# Patient Record
Sex: Male | Born: 1955
Health system: Southern US, Community
[De-identification: ages and names within clinical notes are randomized; demographics above are authoritative.]

## PROBLEM LIST (undated history)

## (undated) DIAGNOSIS — F419 Anxiety disorder, unspecified: Secondary | ICD-10-CM

## (undated) DIAGNOSIS — B1920 Unspecified viral hepatitis C without hepatic coma: Secondary | ICD-10-CM

## (undated) DIAGNOSIS — I639 Cerebral infarction, unspecified: Secondary | ICD-10-CM

## (undated) DIAGNOSIS — G8929 Other chronic pain: Secondary | ICD-10-CM

## (undated) DIAGNOSIS — M549 Dorsalgia, unspecified: Secondary | ICD-10-CM

## (undated) DIAGNOSIS — I10 Essential (primary) hypertension: Secondary | ICD-10-CM

## (undated) HISTORY — DX: Unspecified viral hepatitis C without hepatic coma: B19.20

---

## 1999-08-09 HISTORY — PX: CERVICAL DISC SURGERY: SHX588

## 1999-10-05 ENCOUNTER — Encounter: Payer: Self-pay | Admitting: Neurosurgery

## 1999-10-05 ENCOUNTER — Ambulatory Visit (HOSPITAL_COMMUNITY): Admission: RE | Admit: 1999-10-05 | Discharge: 1999-10-05 | Payer: Self-pay | Admitting: Neurosurgery

## 1999-12-14 ENCOUNTER — Ambulatory Visit (HOSPITAL_COMMUNITY): Admission: RE | Admit: 1999-12-14 | Discharge: 1999-12-14 | Payer: Self-pay | Admitting: Neurosurgery

## 1999-12-14 ENCOUNTER — Encounter: Payer: Self-pay | Admitting: Neurosurgery

## 2000-01-07 ENCOUNTER — Encounter: Payer: Self-pay | Admitting: Neurosurgery

## 2000-01-07 ENCOUNTER — Ambulatory Visit (HOSPITAL_COMMUNITY): Admission: RE | Admit: 2000-01-07 | Discharge: 2000-01-07 | Payer: Self-pay | Admitting: Neurosurgery

## 2004-06-11 ENCOUNTER — Ambulatory Visit (HOSPITAL_COMMUNITY): Admission: RE | Admit: 2004-06-11 | Discharge: 2004-06-11 | Payer: Self-pay | Admitting: Family Medicine

## 2005-08-16 ENCOUNTER — Ambulatory Visit (HOSPITAL_COMMUNITY): Admission: RE | Admit: 2005-08-16 | Discharge: 2005-08-16 | Payer: Self-pay | Admitting: Family Medicine

## 2005-08-30 ENCOUNTER — Ambulatory Visit: Payer: Self-pay | Admitting: Gastroenterology

## 2005-09-30 ENCOUNTER — Ambulatory Visit: Payer: Self-pay | Admitting: Gastroenterology

## 2005-10-31 ENCOUNTER — Encounter (INDEPENDENT_AMBULATORY_CARE_PROVIDER_SITE_OTHER): Payer: Self-pay | Admitting: *Deleted

## 2005-10-31 ENCOUNTER — Other Ambulatory Visit: Admission: RE | Admit: 2005-10-31 | Discharge: 2005-10-31 | Payer: Self-pay | Admitting: Urology

## 2005-11-08 ENCOUNTER — Ambulatory Visit (HOSPITAL_COMMUNITY): Admission: RE | Admit: 2005-11-08 | Discharge: 2005-11-08 | Payer: Self-pay | Admitting: General Surgery

## 2006-01-13 ENCOUNTER — Encounter (INDEPENDENT_AMBULATORY_CARE_PROVIDER_SITE_OTHER): Payer: Self-pay | Admitting: Specialist

## 2006-01-13 ENCOUNTER — Ambulatory Visit (HOSPITAL_COMMUNITY): Admission: RE | Admit: 2006-01-13 | Discharge: 2006-01-13 | Payer: Self-pay | Admitting: Gastroenterology

## 2006-01-26 ENCOUNTER — Ambulatory Visit: Payer: Self-pay | Admitting: Gastroenterology

## 2006-07-21 ENCOUNTER — Ambulatory Visit: Payer: Self-pay | Admitting: Gastroenterology

## 2006-10-17 ENCOUNTER — Encounter: Admission: RE | Admit: 2006-10-17 | Discharge: 2006-10-17 | Payer: Self-pay | Admitting: Neurosurgery

## 2010-12-24 NOTE — Op Note (Signed)
[place]. [facility]  Patient:    Brendan Charles, Brendan Charles                     MRN: [id number] Proc. Date: 12/14/99 Adm. Date:  [id number] Disc. Date: [id number] Attending:  Gerald Dexter                           Operative Report  PREOPERATIVE DIAGNOSIS:  Spondylosis C5-6, C6-7 right.  POSTOPERATIVE DIAGNOSIS:  Spondylosis C5-6, C6-7 right.  PROCEDURE:  C5-6 and C6-7 anterior cervical diskectomy with fibular bone bank fusion followed by Atlantis anterior cervical plating with the operating microscope.  SURGEON:  Reinaldo Meeker, M.D.  ASSISTANT:  Julio Sicks, M.D.  DESCRIPTION OF PROCEDURE:  After being placed in the supine position in 5 pounds of Holter traction, the patients neck was prepped and draped in the usual sterile fashion.  A localizing x-ray was taken prior to incision to identify the appropriate level.  A transverse incision was then made in the right anterior neck starting at the midline heading toward the medial aspect of the sternocleidomastoid muscle.  The platysma muscle was then incised transversely.  The natural fascial plane between the strap muscles medially and the sternocleidomastoid laterally as identified and followed down to the anterior aspect of the cervical spine.  The  longus coli muscles were identified and split in the midline, stripped away bilaterally with the Kitner dissecting elevator.  A second x-ray was taken to confirm approach at the appropriate levels and this was correct.  Using the 15 blade, the disk at C5-6 and C6-7 was incised.  Using pituitary rongeurs and curets, approximately 90% of the disk material at both levels was removed.  Highspeed drill was used to widen the disk spaces.  The microscope was draped, brought into the  field, and used for the remainder of the case.  Starting at C5-6, the remainder of the disk material down to the posterior longitudinal ligament was removed.  The  posterior  longitudinal ligament was then incised transversely and the cut edges  removed with the small Kerrison punch.  Aggressive right C5-6 foraminal decompression was then carried out due to the marked spondylosis noted upon the  nerve root.  Very thorough decompression was carried out.  At this point, further decompression was carried out on the left, asymptomatic side, but not as aggressively as on the right.  Inspection at this time was carried out for any signs of residual compression and none was identified.  Attention was then turned to C6-7 where a very similar finding was carried out.  Once again, the posterior longitudinal ligament was then incised transversely and the cut edges removed with the Kerrison punch.  Once again, marked spondylosis was noted in the C6-7 foramen on the right and very thorough C7 nerve root decompression was then carried out  using small Kerrison punch.  At this point, inspection was carried out on both levels for any evidence of residual compression and none could be identified. Large amounts of irrigation were carried out.  A 6 and 7 mm bone bank plugs were reconstituted.  After irrigating once more to confirm the hemostasis, the 6 mm lug was impacted at C5-6 and the 7 mm plug impacted at C6-7.  Fluoroscopy at this time showed the plugs to be in excellent position.  Large amounts of irrigation were  carried out once more.  An appropriate Atlantis anterior cervical plate  was then chosen.  Under fluoroscopic guidance, pilot holes were drilled, tapped, and then screws placed into C5, C6, and C7 vertebral bodies.  Locking screws were then tightened.  Final fluoroscopy in lateral plane showed excellent placement of the plate and screws.  Large amounts of irrigation were carried out at this time and any bleeding controlled with bipolar coagulation.  The wound was then closed using interrupted Vicryl on the platysma muscle, inverted 5-0 PDS in the  subcuticular  layer, staples on the skin.  A sterile dressing and Aspen collar were applied. he patient was extubated and taken to the recovery room in stable condition. DD:  12/14/99 TD:  12/15/99 Job: [id number]

## 2010-12-24 NOTE — H&P (Signed)
NAME:  Brendan Charles, Brendan Charles              ACCOUNT NO.:  [id number]   MEDICAL RECORD NO.:  [id number]          PATIENT TYPE:  AMB   LOCATION:  DAY                           FACILITY:  [facility]   PHYSICIAN:  Dalia Heading, M.D.  DATE OF BIRTH:  March 28, 1956   DATE OF ADMISSION:  DATE OF DISCHARGE:  LH                                HISTORY & PHYSICAL   CHIEF COMPLAINT:  Need for screening colonoscopy.   HISTORY OF PRESENT ILLNESS:  The patient is a 55 year old white male who is  referred for endoscopic evaluation.  Needs colonoscopy for screening  purposes.  No abdominal pain, weight loss, nausea, vomiting, diarrhea,  constipation, melena or hematochezia been noted.  He has never had a  colonoscopy.  There is no family history of colon carcinoma.   PAST MEDICAL HISTORY:  Unremarkable.   PAST SURGICAL HISTORY:  Neck surgery.   CURRENT MEDICATIONS:   CURRENT MEDICATIONS:  Voltaren, Lorcet and Flexeril.   ALLERGIES:  No known drug allergies.   REVIEW OF SYSTEMS:  The patient smokes a half pack cigarettes a day.  Denies  any significant alcohol use.   PHYSICAL EXAMINATION:  GENERAL:  On physical examination, the patient is a  well-developed, well-nourished white male in no acute distress.  LUNGS:  Clear to auscultation with equal breath sounds bilaterally.  HEART:  Heart examination reveals regular rate and rhythm without history,  S4 or murmurs.  ABDOMEN:  The abdomen is soft, nontender, nondistended.  No  hepatosplenomegaly or masses are noted.  RECTAL:  Rectal examination was deferred to the procedure.   IMPRESSION:  Need for screening colonoscopy.   PLAN:  The patient is scheduled for colonoscopy on November 08, 2005.  Risks and  benefits of procedure including bleeding and perforation were fully  explained to the patient, who gave informed consent.      Dalia Heading, M.D.  Electronically Signed     MAJ/MEDQ  D:  11/02/2005  T:  11/02/2005  Job:  [id number]   cc:   Kirk Ruths, M.D.  Fax: [phone]

## 2013-08-08 HISTORY — PX: LUMBAR SPINE SURGERY: SHX701

## 2014-02-21 ENCOUNTER — Other Ambulatory Visit (HOSPITAL_COMMUNITY): Payer: Self-pay | Admitting: *Deleted

## 2014-02-21 ENCOUNTER — Ambulatory Visit (HOSPITAL_COMMUNITY)
Admission: RE | Admit: 2014-02-21 | Discharge: 2014-02-21 | Disposition: A | Discharge status: Home / Self Care | Payer: Disability Insurance | Source: Ambulatory Visit | Attending: Family Medicine | Admitting: Family Medicine

## 2014-02-21 DIAGNOSIS — M549 Dorsalgia, unspecified: Secondary | ICD-10-CM | POA: Diagnosis not present

## 2014-02-21 DIAGNOSIS — M4120 Other idiopathic scoliosis, site unspecified: Secondary | ICD-10-CM | POA: Diagnosis not present

## 2014-02-21 DIAGNOSIS — M5030 Other cervical disc degeneration, unspecified cervical region: Secondary | ICD-10-CM | POA: Diagnosis not present

## 2014-02-21 DIAGNOSIS — M419 Scoliosis, unspecified: Secondary | ICD-10-CM

## 2014-02-21 DIAGNOSIS — S3992XA Unspecified injury of lower back, initial encounter: Secondary | ICD-10-CM

## 2014-02-21 DIAGNOSIS — M542 Cervicalgia: Secondary | ICD-10-CM | POA: Insufficient documentation

## 2014-04-16 ENCOUNTER — Other Ambulatory Visit (HOSPITAL_COMMUNITY): Payer: Self-pay | Admitting: Family Medicine

## 2014-04-16 DIAGNOSIS — M545 Low back pain: Secondary | ICD-10-CM

## 2014-04-18 ENCOUNTER — Ambulatory Visit (HOSPITAL_COMMUNITY)
Admission: RE | Admit: 2014-04-18 | Discharge: 2014-04-18 | Disposition: A | Discharge status: Home / Self Care | Payer: Medicaid Other | Source: Ambulatory Visit | Attending: Family Medicine | Admitting: Family Medicine

## 2014-04-18 DIAGNOSIS — M51379 Other intervertebral disc degeneration, lumbosacral region without mention of lumbar back pain or lower extremity pain: Secondary | ICD-10-CM | POA: Insufficient documentation

## 2014-04-18 DIAGNOSIS — M48061 Spinal stenosis, lumbar region without neurogenic claudication: Secondary | ICD-10-CM | POA: Diagnosis not present

## 2014-04-18 DIAGNOSIS — M5137 Other intervertebral disc degeneration, lumbosacral region: Secondary | ICD-10-CM | POA: Insufficient documentation

## 2014-04-18 DIAGNOSIS — M545 Low back pain, unspecified: Secondary | ICD-10-CM | POA: Diagnosis present

## 2014-04-18 DIAGNOSIS — M79609 Pain in unspecified limb: Secondary | ICD-10-CM | POA: Diagnosis not present

## 2014-04-18 DIAGNOSIS — M5126 Other intervertebral disc displacement, lumbar region: Secondary | ICD-10-CM | POA: Diagnosis not present

## 2014-05-07 ENCOUNTER — Other Ambulatory Visit: Payer: Self-pay | Admitting: Neurosurgery

## 2014-05-07 DIAGNOSIS — M4316 Spondylolisthesis, lumbar region: Secondary | ICD-10-CM

## 2014-05-12 ENCOUNTER — Ambulatory Visit
Admission: RE | Admit: 2014-05-12 | Discharge: 2014-05-12 | Disposition: A | Discharge status: Home / Self Care | Payer: Medicaid Other | Source: Ambulatory Visit | Attending: Neurosurgery | Admitting: Neurosurgery

## 2014-05-12 VITALS — BP 148/87 | HR 80

## 2014-05-12 DIAGNOSIS — M4316 Spondylolisthesis, lumbar region: Secondary | ICD-10-CM

## 2014-05-12 MED ORDER — IOHEXOL 180 MG/ML  SOLN
1.0000 mL | Freq: Once | INTRAMUSCULAR | Status: AC | PRN
  Administered 2014-05-12: 1 mL via EPIDURAL

## 2014-05-12 MED ORDER — METHYLPREDNISOLONE ACETATE 40 MG/ML INJ SUSP (RADIOLOG
120.0000 mg | Freq: Once | INTRAMUSCULAR | Status: AC
  Administered 2014-05-12: 120 mg via EPIDURAL

## 2014-05-12 NOTE — Discharge Instructions (Signed)

## 2014-05-19 ENCOUNTER — Other Ambulatory Visit: Payer: Self-pay | Admitting: Neurosurgery

## 2014-05-20 ENCOUNTER — Encounter (HOSPITAL_COMMUNITY): Payer: Self-pay | Admitting: Pharmacy Technician

## 2014-05-26 ENCOUNTER — Other Ambulatory Visit (HOSPITAL_COMMUNITY): Payer: Self-pay | Admitting: *Deleted

## 2014-05-26 NOTE — Pre-Procedure Instructions (Signed)
Susanne GreenhouseDouglas B Sammons  05/26/2014   Your procedure is scheduled on:  Tuesday, June 03, 2014 at 11:30 AM.   [facility] at 9:30 AM.   Call this number if you have problems the morning of surgery: [phone]   Remember:   Do not eat food or drink liquids after midnight Monday, 06/02/14.   Take these medicines the morning of surgery with A SIP OF WATER: ALPRAZolam Prudy Feeler(XANAX) - if needed, HYDROcodone-acetaminophen (NORCO) - if needed    Do not wear jewelry.  Do not wear lotions, powders, or cologne. You may wear deodorant.  Men may shave face and neck.  Do not bring valuables to the hospital.  [facility] not responsible                  for any belongings or valuables.               Contacts, dentures or bridgework may not be worn into surgery.  Leave suitcase in the car. After surgery it may be brought to your room.  For patients admitted to the hospital, discharge time is determined by your                treatment team.              Please read over the following fact sheets that you were given: Pain Booklet, Coughing and Deep Breathing, Blood Transfusion Information, MRSA Information and Surgical Site Infection Prevention

## 2014-05-27 ENCOUNTER — Encounter (HOSPITAL_COMMUNITY)
Admission: RE | Admit: 2014-05-27 | Discharge: 2014-05-27 | Disposition: A | Discharge status: Home / Self Care | Payer: Medicaid Other | Source: Ambulatory Visit | Attending: Neurosurgery | Admitting: Neurosurgery

## 2014-05-27 ENCOUNTER — Encounter (HOSPITAL_COMMUNITY): Payer: Self-pay

## 2014-05-27 DIAGNOSIS — Z01812 Encounter for preprocedural laboratory examination: Secondary | ICD-10-CM | POA: Diagnosis not present

## 2014-05-27 HISTORY — DX: Anxiety disorder, unspecified: F41.9

## 2014-05-27 LAB — CBC
HCT: 48.1 % (ref 39.0–52.0)
Hemoglobin: 16.5 g/dL (ref 13.0–17.0)
MCH: 31.3 pg (ref 26.0–34.0)
MCHC: 34.3 g/dL (ref 30.0–36.0)
MCV: 91.1 fL (ref 78.0–100.0)
Platelets: 294 10*3/uL (ref 150–400)
RBC: 5.28 MIL/uL (ref 4.22–5.81)
RDW: 13.7 % (ref 11.5–15.5)
WBC: 13.3 10*3/uL — AB (ref 4.0–10.5)

## 2014-05-27 LAB — COMPREHENSIVE METABOLIC PANEL
ALBUMIN: 4 g/dL (ref 3.5–5.2)
ALK PHOS: 109 U/L (ref 39–117)
ALT: 62 U/L — ABNORMAL HIGH (ref 0–53)
ANION GAP: 13 (ref 5–15)
AST: 26 U/L (ref 0–37)
BUN: 16 mg/dL (ref 6–23)
CO2: 25 mEq/L (ref 19–32)
Calcium: 9.7 mg/dL (ref 8.4–10.5)
Chloride: 102 mEq/L (ref 96–112)
Creatinine, Ser: 0.89 mg/dL (ref 0.50–1.35)
GFR calc Af Amer: >90 mL/min (ref >90)
GFR calc non Af Amer: >90 mL/min (ref >90)
Glucose, Bld: 97 mg/dL (ref 70–99)
POTASSIUM: 4.3 meq/L (ref 3.7–5.3)
SODIUM: 140 meq/L (ref 137–147)
TOTAL PROTEIN: 7.6 g/dL (ref 6.0–8.3)
Total Bilirubin: 0.4 mg/dL (ref 0.3–1.2)

## 2014-05-27 LAB — TYPE AND SCREEN
ABO/RH(D): B POS
Antibody Screen: NEGATIVE

## 2014-05-27 LAB — SURGICAL PCR SCREEN
MRSA, PCR: NEGATIVE
Staphylococcus aureus: NEGATIVE

## 2014-05-27 LAB — ABO/RH: ABO/RH(D): B POS

## 2014-05-27 NOTE — Progress Notes (Signed)
Primary - [phone] does not remember name  No cardiologist No prior cardiac testing

## 2014-06-02 MED ORDER — CEFAZOLIN SODIUM-DEXTROSE 2-3 GM-% IV SOLR
2.0000 g | INTRAVENOUS | Status: AC
  Administered 2014-06-03: 2 g via INTRAVENOUS
  Filled 2014-06-02: qty 50

## 2014-06-02 MED ORDER — DEXAMETHASONE SODIUM PHOSPHATE 10 MG/ML IJ SOLN
10.0000 mg | INTRAMUSCULAR | Status: DC
  Filled 2014-06-02: qty 1

## 2014-06-02 NOTE — Anesthesia Preprocedure Evaluation (Addendum)
Anesthesia Evaluation  Patient identified by MRN, date of birth, ID band Patient awake    Reviewed: Allergy & Precautions, H&P , NPO status , Patient's Chart, lab work & pertinent test results  Airway Mallampati: II   Neck ROM: Full    Dental  (+) Missing, Dental Advisory Given   Pulmonary Current Smoker,  Current smoker, 25 pack year breath sounds clear to auscultation        Cardiovascular Rhythm:Regular     Neuro/Psych Anxiety Xanax   GI/Hepatic (+) Hepatitis -  Endo/Other    Renal/GU      Musculoskeletal   Abdominal (+)  Abdomen: soft.    Peds  Hematology   Anesthesia Other Findings   Reproductive/Obstetrics                            Anesthesia Physical Anesthesia Plan  ASA: III  Anesthesia Plan: General   Post-op Pain Management:    Induction: Intravenous  Airway Management Planned: Oral ETT  Additional Equipment:   Intra-op Plan:   Post-operative Plan:   Informed Consent: I have reviewed the patients History and Physical, chart, labs and discussed the procedure including the risks, benefits and alternatives for the proposed anesthesia with the patient or authorized representative who has indicated his/her understanding and acceptance.     Plan Discussed with:   Anesthesia Plan Comments: (Multinodal pain RX, (Tylenol, Precedex, Decadron, Toradal if OK by surgeon))        Anesthesia Quick Evaluation

## 2014-06-02 NOTE — Progress Notes (Signed)
Patient instructed to arrive at 530 am on 06/03/14.

## 2014-06-03 ENCOUNTER — Inpatient Hospital Stay (HOSPITAL_COMMUNITY): Payer: Medicaid Other

## 2014-06-03 ENCOUNTER — Encounter (HOSPITAL_COMMUNITY)
Admission: RE | Disposition: A | Discharge status: Home / Self Care | Payer: Self-pay | Source: Ambulatory Visit | Attending: Neurosurgery

## 2014-06-03 ENCOUNTER — Encounter (HOSPITAL_COMMUNITY): Payer: Self-pay | Admitting: *Deleted

## 2014-06-03 ENCOUNTER — Inpatient Hospital Stay (HOSPITAL_COMMUNITY): Payer: Medicaid Other | Admitting: Anesthesiology

## 2014-06-03 ENCOUNTER — Encounter (HOSPITAL_COMMUNITY): Payer: Medicaid Other | Admitting: Anesthesiology

## 2014-06-03 ENCOUNTER — Inpatient Hospital Stay (HOSPITAL_COMMUNITY)
Admission: RE | Admit: 2014-06-03 | Discharge: 2014-06-04 | DRG: 460 | Disposition: A | Discharge status: Home / Self Care | Payer: Medicaid Other | Source: Ambulatory Visit | Attending: Neurosurgery | Admitting: Neurosurgery

## 2014-06-03 DIAGNOSIS — F419 Anxiety disorder, unspecified: Secondary | ICD-10-CM | POA: Diagnosis present

## 2014-06-03 DIAGNOSIS — Z7982 Long term (current) use of aspirin: Secondary | ICD-10-CM | POA: Diagnosis not present

## 2014-06-03 DIAGNOSIS — M4316 Spondylolisthesis, lumbar region: Principal | ICD-10-CM | POA: Diagnosis present

## 2014-06-03 DIAGNOSIS — M5126 Other intervertebral disc displacement, lumbar region: Secondary | ICD-10-CM | POA: Diagnosis present

## 2014-06-03 DIAGNOSIS — M549 Dorsalgia, unspecified: Secondary | ICD-10-CM | POA: Diagnosis present

## 2014-06-03 DIAGNOSIS — F17210 Nicotine dependence, cigarettes, uncomplicated: Secondary | ICD-10-CM | POA: Diagnosis present

## 2014-06-03 DIAGNOSIS — M4326 Fusion of spine, lumbar region: Secondary | ICD-10-CM

## 2014-06-03 SURGERY — POSTERIOR LUMBAR FUSION 1 LEVEL
Anesthesia: General | Site: Back | Laterality: Bilateral

## 2014-06-03 MED ORDER — MIDAZOLAM HCL 5 MG/5ML IJ SOLN
INTRAMUSCULAR | Status: DC | PRN
  Administered 2014-06-03: 2 mg via INTRAVENOUS

## 2014-06-03 MED ORDER — PANTOPRAZOLE SODIUM 40 MG IV SOLR
40.0000 mg | Freq: Every day | INTRAVENOUS | Status: DC
  Administered 2014-06-03: 40 mg via INTRAVENOUS
  Filled 2014-06-03 (×2): qty 40

## 2014-06-03 MED ORDER — ACETAMINOPHEN 650 MG RE SUPP: 650.0000 mg | RECTAL | Status: DC | PRN

## 2014-06-03 MED ORDER — HYDROCODONE-ACETAMINOPHEN 5-325 MG PO TABS
1.0000 | ORAL_TABLET | ORAL | Status: DC | PRN
  Administered 2014-06-03 – 2014-06-04 (×4): 2 via ORAL
  Filled 2014-06-03 (×3): qty 2

## 2014-06-03 MED ORDER — FENTANYL CITRATE 0.05 MG/ML IJ SOLN
INTRAMUSCULAR | Status: AC
  Filled 2014-06-03: qty 5

## 2014-06-03 MED ORDER — ACETAMINOPHEN 10 MG/ML IV SOLN
INTRAVENOUS | Status: AC
  Administered 2014-06-03: 1000 mg via INTRAVENOUS
  Filled 2014-06-03: qty 100

## 2014-06-03 MED ORDER — FENTANYL CITRATE 0.05 MG/ML IJ SOLN
25.0000 ug | INTRAMUSCULAR | Status: DC | PRN
  Administered 2014-06-03: 25 ug via INTRAVENOUS
  Administered 2014-06-03: 50 ug via INTRAVENOUS
  Administered 2014-06-03: 25 ug via INTRAVENOUS
  Administered 2014-06-03: 50 ug via INTRAVENOUS

## 2014-06-03 MED ORDER — ONDANSETRON HCL 4 MG/2ML IJ SOLN
INTRAMUSCULAR | Status: AC
  Filled 2014-06-03: qty 2

## 2014-06-03 MED ORDER — SODIUM CHLORIDE 0.9 % IJ SOLN: 3.0000 mL | INTRAMUSCULAR | Status: DC | PRN

## 2014-06-03 MED ORDER — DEXMEDETOMIDINE HCL IN NACL 200 MCG/50ML IV SOLN
INTRAVENOUS | Status: AC
  Filled 2014-06-03: qty 50

## 2014-06-03 MED ORDER — PROPOFOL 10 MG/ML IV BOLUS
INTRAVENOUS | Status: DC | PRN
Start: 2014-06-03 — End: 2014-06-03
  Administered 2014-06-03: 50 mg via INTRAVENOUS
  Administered 2014-06-03: 150 mg via INTRAVENOUS

## 2014-06-03 MED ORDER — SODIUM CHLORIDE 0.9 % IV SOLN
10.0000 mg | INTRAVENOUS | Status: DC | PRN
  Administered 2014-06-03: 10 ug/min via INTRAVENOUS

## 2014-06-03 MED ORDER — 0.9 % SODIUM CHLORIDE (POUR BTL) OPTIME
TOPICAL | Status: DC | PRN
  Administered 2014-06-03: 1000 mL

## 2014-06-03 MED ORDER — FENTANYL CITRATE 0.05 MG/ML IJ SOLN
INTRAMUSCULAR | Status: AC
  Administered 2014-06-03: 25 ug via INTRAVENOUS
  Filled 2014-06-03: qty 2

## 2014-06-03 MED ORDER — HYDROMORPHONE HCL 1 MG/ML IJ SOLN
1.0000 mg | INTRAMUSCULAR | Status: DC | PRN
  Administered 2014-06-03: 1.5 mg via INTRAMUSCULAR
  Administered 2014-06-03: 1 mg via INTRAMUSCULAR
  Administered 2014-06-04: 1.5 mg via INTRAMUSCULAR
  Filled 2014-06-03: qty 2
  Filled 2014-06-03: qty 1
  Filled 2014-06-03: qty 2

## 2014-06-03 MED ORDER — DEXAMETHASONE SODIUM PHOSPHATE 4 MG/ML IJ SOLN: 4.0000 mg | Freq: Four times a day (QID) | INTRAMUSCULAR | Status: AC

## 2014-06-03 MED ORDER — EPHEDRINE SULFATE 50 MG/ML IJ SOLN
INTRAMUSCULAR | Status: DC | PRN
  Administered 2014-06-03: 5 mg via INTRAVENOUS
  Administered 2014-06-03: 10 mg via INTRAVENOUS

## 2014-06-03 MED ORDER — ARTIFICIAL TEARS OP OINT
TOPICAL_OINTMENT | OPHTHALMIC | Status: AC
  Filled 2014-06-03: qty 3.5

## 2014-06-03 MED ORDER — NEOSTIGMINE METHYLSULFATE 10 MG/10ML IV SOLN
INTRAVENOUS | Status: DC | PRN
  Administered 2014-06-03: 5 mg via INTRAVENOUS

## 2014-06-03 MED ORDER — STERILE WATER FOR INJECTION IJ SOLN
INTRAMUSCULAR | Status: AC
  Filled 2014-06-03: qty 10

## 2014-06-03 MED ORDER — CYCLOBENZAPRINE HCL 10 MG PO TABS
ORAL_TABLET | ORAL | Status: AC
  Filled 2014-06-03: qty 1

## 2014-06-03 MED ORDER — PHENOL 1.4 % MT LIQD: 1.0000 | OROMUCOSAL | Status: DC | PRN

## 2014-06-03 MED ORDER — MENTHOL 3 MG MT LOZG: 1.0000 | LOZENGE | OROMUCOSAL | Status: DC | PRN

## 2014-06-03 MED ORDER — THROMBIN 20000 UNITS EX SOLR
CUTANEOUS | Status: DC | PRN
  Administered 2014-06-03: 08:00:00 via TOPICAL

## 2014-06-03 MED ORDER — ALUM & MAG HYDROXIDE-SIMETH 200-200-20 MG/5ML PO SUSP
30.0000 mL | Freq: Four times a day (QID) | ORAL | Status: DC | PRN
Start: 2014-06-03 — End: 2014-06-04

## 2014-06-03 MED ORDER — SODIUM CHLORIDE 0.9 % IJ SOLN
INTRAMUSCULAR | Status: AC
  Filled 2014-06-03: qty 10

## 2014-06-03 MED ORDER — GLYCOPYRROLATE 0.2 MG/ML IJ SOLN
INTRAMUSCULAR | Status: AC
  Filled 2014-06-03: qty 4

## 2014-06-03 MED ORDER — EPHEDRINE SULFATE 50 MG/ML IJ SOLN
INTRAMUSCULAR | Status: AC
  Filled 2014-06-03: qty 1

## 2014-06-03 MED ORDER — ALPRAZOLAM 0.5 MG PO TABS
0.5000 mg | ORAL_TABLET | Freq: Three times a day (TID) | ORAL | Status: DC | PRN
  Administered 2014-06-03 (×2): 0.5 mg via ORAL
  Filled 2014-06-03 (×2): qty 1

## 2014-06-03 MED ORDER — VECURONIUM BROMIDE 10 MG IV SOLR
INTRAVENOUS | Status: AC
  Filled 2014-06-03: qty 10

## 2014-06-03 MED ORDER — BUPIVACAINE LIPOSOME 1.3 % IJ SUSP
20.0000 mL | INTRAMUSCULAR | Status: AC
  Administered 2014-06-03: 20 mL
  Filled 2014-06-03: qty 20

## 2014-06-03 MED ORDER — ONDANSETRON HCL 4 MG/2ML IJ SOLN: 4.0000 mg | INTRAMUSCULAR | Status: DC | PRN

## 2014-06-03 MED ORDER — ARTIFICIAL TEARS OP OINT
TOPICAL_OINTMENT | OPHTHALMIC | Status: DC | PRN
  Administered 2014-06-03: 1 via OPHTHALMIC

## 2014-06-03 MED ORDER — SODIUM CHLORIDE 0.9 % IV SOLN
INTRAVENOUS | Status: DC | PRN
  Administered 2014-06-03: 07:00:00 via INTRAVENOUS

## 2014-06-03 MED ORDER — ROCURONIUM BROMIDE 100 MG/10ML IV SOLN
INTRAVENOUS | Status: DC | PRN
Start: 2014-06-03 — End: 2014-06-03
  Administered 2014-06-03: 50 mg via INTRAVENOUS

## 2014-06-03 MED ORDER — LACTATED RINGERS IV SOLN
INTRAVENOUS | Status: DC | PRN
Start: 2014-06-03 — End: 2014-06-03
  Administered 2014-06-03: 07:00:00 via INTRAVENOUS

## 2014-06-03 MED ORDER — DEXAMETHASONE SODIUM PHOSPHATE 10 MG/ML IJ SOLN
INTRAMUSCULAR | Status: DC | PRN
  Administered 2014-06-03: 10 mg via INTRAVENOUS

## 2014-06-03 MED ORDER — DEXAMETHASONE 4 MG PO TABS
4.0000 mg | ORAL_TABLET | Freq: Once | ORAL | Status: AC
  Administered 2014-06-03: 4 mg via ORAL
  Filled 2014-06-03: qty 1

## 2014-06-03 MED ORDER — LIDOCAINE HCL (CARDIAC) 20 MG/ML IV SOLN
INTRAVENOUS | Status: AC
  Filled 2014-06-03: qty 10

## 2014-06-03 MED ORDER — CYCLOBENZAPRINE HCL 10 MG PO TABS
10.0000 mg | ORAL_TABLET | Freq: Three times a day (TID) | ORAL | Status: DC | PRN
  Administered 2014-06-03 – 2014-06-04 (×3): 10 mg via ORAL
  Filled 2014-06-03 (×2): qty 1

## 2014-06-03 MED ORDER — GLYCOPYRROLATE 0.2 MG/ML IJ SOLN
INTRAMUSCULAR | Status: DC | PRN
  Administered 2014-06-03: 0.6 mg via INTRAVENOUS

## 2014-06-03 MED ORDER — ACETAMINOPHEN 325 MG PO TABS: 650.0000 mg | ORAL_TABLET | ORAL | Status: DC | PRN

## 2014-06-03 MED ORDER — FENTANYL CITRATE 0.05 MG/ML IJ SOLN
INTRAMUSCULAR | Status: AC
  Administered 2014-06-03: 50 ug via INTRAVENOUS
  Filled 2014-06-03: qty 2

## 2014-06-03 MED ORDER — SODIUM CHLORIDE 0.9 % IJ SOLN
3.0000 mL | Freq: Two times a day (BID) | INTRAMUSCULAR | Status: DC
  Administered 2014-06-03 (×2): 3 mL via INTRAVENOUS

## 2014-06-03 MED ORDER — SODIUM CHLORIDE 0.9 % IR SOLN
Status: DC | PRN
  Administered 2014-06-03: 08:00:00

## 2014-06-03 MED ORDER — FENTANYL CITRATE 0.05 MG/ML IJ SOLN
INTRAMUSCULAR | Status: DC | PRN
  Administered 2014-06-03: 50 ug via INTRAVENOUS
  Administered 2014-06-03 (×3): 100 ug via INTRAVENOUS
  Administered 2014-06-03: 50 ug via INTRAVENOUS
  Administered 2014-06-03: 100 ug via INTRAVENOUS

## 2014-06-03 MED ORDER — MIDAZOLAM HCL 2 MG/2ML IJ SOLN
INTRAMUSCULAR | Status: AC
  Filled 2014-06-03: qty 2

## 2014-06-03 MED ORDER — HYDROCODONE-ACETAMINOPHEN 5-325 MG PO TABS
ORAL_TABLET | ORAL | Status: AC
  Filled 2014-06-03: qty 2

## 2014-06-03 MED ORDER — NEOSTIGMINE METHYLSULFATE 10 MG/10ML IV SOLN
INTRAVENOUS | Status: AC
  Filled 2014-06-03: qty 1

## 2014-06-03 MED ORDER — DEXMEDETOMIDINE HCL 200 MCG/2ML IV SOLN: INTRAVENOUS | Status: DC | PRN

## 2014-06-03 MED ORDER — PROMETHAZINE HCL 25 MG/ML IJ SOLN: 6.2500 mg | INTRAMUSCULAR | Status: DC | PRN

## 2014-06-03 MED ORDER — KCL IN DEXTROSE-NACL 20-5-0.45 MEQ/L-%-% IV SOLN
80.0000 mL/h | INTRAVENOUS | Status: DC
  Filled 2014-06-03 (×3): qty 1000

## 2014-06-03 MED ORDER — DEXAMETHASONE 4 MG PO TABS
4.0000 mg | ORAL_TABLET | Freq: Four times a day (QID) | ORAL | Status: AC
  Administered 2014-06-03: 4 mg via ORAL
  Filled 2014-06-03: qty 1

## 2014-06-03 MED ORDER — VECURONIUM BROMIDE 10 MG IV SOLR
INTRAVENOUS | Status: DC | PRN
  Administered 2014-06-03: 2 mg via INTRAVENOUS
  Administered 2014-06-03: 4 mg via INTRAVENOUS
  Administered 2014-06-03: 2 mg via INTRAVENOUS

## 2014-06-03 MED ORDER — DEXMEDETOMIDINE HCL 200 MCG/2ML IV SOLN
INTRAVENOUS | Status: DC | PRN
  Administered 2014-06-03 (×4): 10 ug via INTRAVENOUS

## 2014-06-03 MED ORDER — ONDANSETRON HCL 4 MG/2ML IJ SOLN
INTRAMUSCULAR | Status: DC | PRN
  Administered 2014-06-03: 4 mg via INTRAVENOUS

## 2014-06-03 MED ORDER — MEPERIDINE HCL 25 MG/ML IJ SOLN: 6.2500 mg | INTRAMUSCULAR | Status: DC | PRN

## 2014-06-03 MED ORDER — LIDOCAINE HCL (CARDIAC) 20 MG/ML IV SOLN
INTRAVENOUS | Status: DC | PRN
  Administered 2014-06-03: 50 mg via INTRAVENOUS
  Administered 2014-06-03: 100 mg via INTRAVENOUS

## 2014-06-03 MED ORDER — SODIUM CHLORIDE 0.9 % IV SOLN
INTRAVENOUS | Status: DC | PRN
  Administered 2014-06-03: 08:00:00 via INTRAVENOUS

## 2014-06-03 MED ORDER — CEFAZOLIN SODIUM-DEXTROSE 2-3 GM-% IV SOLR
2.0000 g | Freq: Three times a day (TID) | INTRAVENOUS | Status: AC
  Administered 2014-06-03 (×2): 2 g via INTRAVENOUS
  Filled 2014-06-03 (×2): qty 50

## 2014-06-03 SURGICAL SUPPLY — 74 items
BAG DECANTER FOR FLEXI CONT (MISCELLANEOUS) ×3 IMPLANT
BENZOIN TINCTURE PRP APPL 2/3 (GAUZE/BANDAGES/DRESSINGS) ×6 IMPLANT
BLADE CLIPPER SURG (BLADE) IMPLANT
BONE EQUIVA 5CC (Bone Implant) ×3 IMPLANT
BRUSH SCRUB EZ PLAIN DRY (MISCELLANEOUS) ×3 IMPLANT
BUR CUTTER 7.0 ROUND (BURR) ×3 IMPLANT
BUR MATCHSTICK NEURO 3.0 LAGG (BURR) ×3 IMPLANT
CAGE PEEK OPTIMA ARDIS 11X9X26 (Cage) ×6 IMPLANT
CANISTER SUCT 3000ML (MISCELLANEOUS) ×3 IMPLANT
CLOSURE WOUND 1/2 X4 (GAUZE/BANDAGES/DRESSINGS) ×2
CONT SPEC 4OZ CLIKSEAL STRL BL (MISCELLANEOUS) ×6 IMPLANT
COVER BACK TABLE 60X90IN (DRAPES) ×3 IMPLANT
DERMABOND ADVANCED (GAUZE/BANDAGES/DRESSINGS)
DERMABOND ADVANCED .7 DNX12 (GAUZE/BANDAGES/DRESSINGS) IMPLANT
DRAPE C-ARM 42X72 X-RAY (DRAPES) ×3 IMPLANT
DRAPE LAPAROTOMY 100X72X124 (DRAPES) ×3 IMPLANT
DRAPE SURG 17X23 STRL (DRAPES) ×6 IMPLANT
DRSG AQUACEL AG ADV 3.5X 6 (GAUZE/BANDAGES/DRESSINGS) ×6 IMPLANT
DRSG OPSITE POSTOP 4X6 (GAUZE/BANDAGES/DRESSINGS) ×3 IMPLANT
DRSG TELFA 3X8 NADH (GAUZE/BANDAGES/DRESSINGS) ×3 IMPLANT
DURAPREP 26ML APPLICATOR (WOUND CARE) ×3 IMPLANT
ELECT REM PT RETURN 9FT ADLT (ELECTROSURGICAL) ×3
ELECTRODE REM PT RTRN 9FT ADLT (ELECTROSURGICAL) ×1 IMPLANT
EVACUATOR 1/8 PVC DRAIN (DRAIN) ×3 IMPLANT
GAUZE SPONGE 4X4 12PLY STRL (GAUZE/BANDAGES/DRESSINGS) ×3 IMPLANT
GAUZE SPONGE 4X4 16PLY XRAY LF (GAUZE/BANDAGES/DRESSINGS) ×3 IMPLANT
GLOVE BIOGEL PI IND STRL 7.0 (GLOVE) ×1 IMPLANT
GLOVE BIOGEL PI IND STRL 7.5 (GLOVE) ×3 IMPLANT
GLOVE BIOGEL PI INDICATOR 7.0 (GLOVE) ×2
GLOVE BIOGEL PI INDICATOR 7.5 (GLOVE) ×6
GLOVE ECLIPSE 7.0 STRL STRAW (GLOVE) ×3 IMPLANT
GLOVE ECLIPSE 8.0 STRL XLNG CF (GLOVE) ×6 IMPLANT
GLOVE EXAM NITRILE LRG STRL (GLOVE) IMPLANT
GLOVE EXAM NITRILE MD LF STRL (GLOVE) IMPLANT
GLOVE EXAM NITRILE XS STR PU (GLOVE) IMPLANT
GLOVE SS N UNI LF 7.0 STRL (GLOVE) ×12 IMPLANT
GOWN STRL REUS W/ TWL LRG LVL3 (GOWN DISPOSABLE) ×2 IMPLANT
GOWN STRL REUS W/ TWL XL LVL3 (GOWN DISPOSABLE) ×2 IMPLANT
GOWN STRL REUS W/TWL 2XL LVL3 (GOWN DISPOSABLE) IMPLANT
GOWN STRL REUS W/TWL LRG LVL3 (GOWN DISPOSABLE) ×4
GOWN STRL REUS W/TWL XL LVL3 (GOWN DISPOSABLE) ×4
HANDLE PEDIGUARD CANNULATED (INSTRUMENTS) ×3 IMPLANT
K-WIRE NITHNOL TROCAR TIP (WIRE) ×12 IMPLANT
KIT BASIN OR (CUSTOM PROCEDURE TRAY) ×3 IMPLANT
KIT ROOM TURNOVER OR (KITS) ×3 IMPLANT
NEEDLE 1 PEDIGUARD CANNULATED (NEEDLE) ×6 IMPLANT
NEEDLE HYPO 21X1.5 SAFETY (NEEDLE) ×3 IMPLANT
NEEDLE HYPO 22GX1.5 SAFETY (NEEDLE) ×3 IMPLANT
NS IRRIG 1000ML POUR BTL (IV SOLUTION) ×3 IMPLANT
PACK LAMINECTOMY NEURO (CUSTOM PROCEDURE TRAY) ×3 IMPLANT
PAD ARMBOARD 7.5X6 YLW CONV (MISCELLANEOUS) ×9 IMPLANT
PATTIES SURGICAL .75X.75 (GAUZE/BANDAGES/DRESSINGS) IMPLANT
PEDICLE ACCESS TOOL SHEATH ×3 IMPLANT
ROD PATHFINDER 40MM (Rod) ×3 IMPLANT
ROD PATHFINDER PERC .45MM (Rod) ×3 IMPLANT
SCREW MIN INVASIVE 6.5X45 (Screw) ×6 IMPLANT
SCREW POLYAXIA MIS 6.5X40MM (Screw) ×6 IMPLANT
SHEATH PAT (SHEATH) ×3 IMPLANT
SPONGE LAP 4X18 X RAY DECT (DISPOSABLE) IMPLANT
SPONGE SURGIFOAM ABS GEL 100 (HEMOSTASIS) ×3 IMPLANT
STRIP CLOSURE SKIN 1/2X4 (GAUZE/BANDAGES/DRESSINGS) ×4 IMPLANT
SUT PROLENE 0 CT 1 30 (SUTURE) IMPLANT
SUT VIC AB 0 CT1 18XCR BRD8 (SUTURE) ×2 IMPLANT
SUT VIC AB 0 CT1 8-18 (SUTURE) ×4
SUT VIC AB 2-0 OS6 18 (SUTURE) ×9 IMPLANT
SUT VIC AB 3-0 CP2 18 (SUTURE) ×3 IMPLANT
SYR 20CC LL (SYRINGE) ×3 IMPLANT
SYR 20ML ECCENTRIC (SYRINGE) ×3 IMPLANT
TOP CLSR SEQUOIA (Orthopedic Implant) ×12 IMPLANT
TOWEL OR 17X24 6PK STRL BLUE (TOWEL DISPOSABLE) ×3 IMPLANT
TOWEL OR 17X26 10 PK STRL BLUE (TOWEL DISPOSABLE) ×3 IMPLANT
TRAP SPECIMEN MUCOUS 40CC (MISCELLANEOUS) ×3 IMPLANT
TRAY FOLEY CATH 14FRSI W/METER (CATHETERS) ×3 IMPLANT
WATER STERILE IRR 1000ML POUR (IV SOLUTION) ×3 IMPLANT

## 2014-06-03 NOTE — Anesthesia Procedure Notes (Signed)
Procedure Name: Intubation Date/Time: 06/03/2014 7:42 AM Performed by: Wray KearnsFOLEY, Carinna Newhart A Pre-anesthesia Checklist: Patient identified, Timeout performed, Emergency Drugs available, Suction available and Patient being monitored Patient Re-evaluated:Patient Re-evaluated prior to inductionOxygen Delivery Method: Circle system utilized Preoxygenation: Pre-oxygenation with 100% oxygen Intubation Type: IV induction and Cricoid Pressure applied Ventilation: Mask ventilation without difficulty Laryngoscope Size: Mac and 4 Grade View: Grade I Tube type: Oral Tube size: 8.0 mm Number of attempts: 1 Airway Equipment and Method: Stylet Placement Confirmation: ETT inserted through vocal cords under direct vision,  breath sounds checked- equal and bilateral and positive ETCO2 Secured at: 23 cm Tube secured with: Tape Dental Injury: Teeth and Oropharynx as per pre-operative assessment

## 2014-06-03 NOTE — Transfer of Care (Signed)
Immediate Anesthesia Transfer of Care Note  Patient: Brendan Charles  Procedure(s) Performed: Procedure(s) with comments: POSTERIOR LUMBAR FUSION 1 LEVEL (Bilateral) - POSTERIOR LUMBAR FUSION 1 LEVEL LUMBAR 3-4  Patient Location: PACU  Anesthesia Type:General  Level of Consciousness: awake, oriented, sedated, patient cooperative and responds to stimulation  Airway & Oxygen Therapy: Patient Spontanous Breathing and Patient connected to nasal cannula oxygen  Post-op Assessment: Report given to PACU RN, Post -op Vital signs reviewed and stable, Patient moving all extremities and Patient moving all extremities X 4  Post vital signs: Reviewed and stable  Complications: No apparent anesthesia complications

## 2014-06-03 NOTE — H&P (Signed)
  Brendan GreenhouseDouglas B Bobb is an 58 y.o. male.   Chief Complaint: Back and left leg pain HPI: The patient is a 58 year old gentleman who is evaluated in the office for left lower back pain which goes into his left buttock and into the anterior thigh with tingling. It started back in March of this year when he hurt himself shoveling snow. He saw his medical doctors tried some prednisone without relief. An MRI scan was done he was seen in the office in September. When seen in the office his right side was basically asymptomatic. His MRI scan was reviewed. It showed retrolisthesis at L3-4 with a large disc herniation with an inferior fragment. He was tried an additional conservative therapy with epidural shots and this gave him no relief. He therefore requested surgery now comes for decompression with discectomy interbody fusion and pedicle screw fixation. I have had a long discussion with him regarding the risks and benefits of surgical intervention. The risks discussed include but are not limited to bleeding infection weakness numbness paralysis spinal fluid leak trouble with instrumentation nonunion coma and death. We have discussed alternative methods of therapy on the risks and benefits of nonintervention. He's had the opportunity to ask questions and appears to understand. With this information hand he has requested we proceed with surgery.  Past Medical History  Diagnosis Date  . Anxiety   . Hepatitis     c    Past Surgical History  Procedure Laterality Date  . Cervical disc surgery  2001    anterior    History reviewed. No pertinent family history. Social History:  reports that he has been smoking Cigarettes.  He has a 25 pack-year smoking history. He does not have any smokeless tobacco history on file. He reports that he does not drink alcohol or use illicit drugs.  Allergies:  Allergies  Allergen Reactions  . Codeine Itching and Nausea Only    Medications Prior to Admission  Medication Sig  Dispense Refill  . ALPRAZolam (XANAX) 0.5 MG tablet Take 0.5 mg by mouth 3 (three) times daily as needed for anxiety.      Marland Kitchen. aspirin 81 MG tablet Take 81 mg by mouth daily.      Marland Kitchen. HYDROcodone-acetaminophen (NORCO) 10-325 MG per tablet Take 1 tablet by mouth every 6 (six) hours as needed for moderate pain or severe pain.        No results found for this or any previous visit (from the past 48 hour(s)). No results found.  A comprehensive review of systems was negative.  Blood pressure 147/93, pulse 87, temperature 98 F (36.7 C), temperature source Oral, resp. rate 20, weight 71.668 kg (158 lb), SpO2 99.00%.  The patient is awake alert and oriented. He has no facial asymmetry. He has decreased ankle jerk reflexes bilaterally. His strength however is 5 over 5. Assessment/Plan Impression is that of listhesis and stenosis and a herniated disc at L3-4. The plan is for L3-4 decompression with discectomy interbody fusion and pedicle screw fixation.  Reinaldo MeekerKRITZER,Sokha Craker O, MD 06/03/2014, 7:31 AM

## 2014-06-03 NOTE — Anesthesia Postprocedure Evaluation (Signed)
  Anesthesia Post-op Note  Patient: Brendan Charles  Procedure(s) Performed: Procedure(s) with comments: POSTERIOR LUMBAR FUSION 1 LEVEL (Bilateral) - POSTERIOR LUMBAR FUSION 1 LEVEL LUMBAR 3-4  Patient Location: PACU  Anesthesia Type:General  Level of Consciousness: awake, alert  and oriented  Airway and Oxygen Therapy: Patient Spontanous Breathing and Patient connected to face mask oxygen  Post-op Pain: mild  Post-op Assessment: Post-op Vital signs reviewed, Patient's Cardiovascular Status Stable, Respiratory Function Stable, Patent Airway and No signs of Nausea or vomiting  Post-op Vital Signs: Reviewed and stable  Last Vitals:  Filed Vitals:   06/03/14 1145  BP: 132/79  Pulse: 88  Temp:   Resp: 8    Complications: No apparent anesthesia complications

## 2014-06-03 NOTE — Plan of Care (Signed)
Problem: Consults Goal: Diagnosis - Spinal Surgery Outcome: Completed/Met Date Met:  06/03/14 Thoraco/Lumbar Spine Fusion

## 2014-06-03 NOTE — Op Note (Signed)
Preoperative diagnosis: Spondylolisthesis L3-4 with severe central stenosis Herniated disc L3-4 left with large inferior fragment Postop diagnosis: Same Procedure: Bilateral L3-4 decompressive laminectomy for relief of central stenosis Bilateral L3-4 microdiscectomy for herniated disc L3-4 posterior lumbar interbody fusion with peek interbody spacer L3-4 posterolateral fusion L3-4 nonsegmental instrumentation with Pathfinder percutaneous pedicle screw system Surgeon: Sutton Plake Assistant: Conchita ParisNundkumar  After being placed in the prone position the patient's back was prepped and draped in the usual sterile fashion. Localizing fluoroscopy was used prior to incision to identify the appropriate level. Midline incision was made above the spinous processes of L3 and L4. Using Bovie cutting current the incision was carried on the spinous processes. The plane between the dorsal lumbar fascia and the subcutaneous tissue was dissected free and then a sub-periosteal dissection was then carried out bilaterally on the spinous processes lamina facet joint at L3-4. Self-retaining retractor was placed for exposure Dr. should approach the appropriate level. Using the Leksell rongeur spinous processes and interspinous ligament were removed. Started on the patient's left side generous laminotomy was performed by removing the inferior two thirds of the L3 lamina the medial two thirds of the facet joint and the superior one half of the L4 lamina. Residual bone and ligamentum flavum removed in a piecemeal fashion. Similar decompression was then carried out on the opposite side and then residual midline structures were removed to complete the bilateral decompressive laminectomy. We then did bilateral microdiscectomy. On the left side, there was a very large inferior disc herniation and numerous fragments were removed to decompress the L4 nerve root. We then thoroughly cleaned out the disc space with pituitary rongeurs and curettes. We  cleaned off the endplates with the right of instruments to prepare the disc for interbody fusion. We then distracted the disc space up to an 11 mm size without this was a good choice. We chose to 11 x 9 x 26 mm cages and filled with a mixture of autologous bone morselized allograft. We placed the first cage without difficulty and followed it into excellent position. Prior to placement second cage we placed a mixture of autologous bone morselized allograft deep within the interspace to help with the interbody fusion. We then placed a second cage and followed into good position. We irrigated copiously controlled any bleeding with upper coagulation and Gelfoam. We decorticated the residual facet joint placed a mixture of autologous bone morselized allograft for posterolateral fusion. We then closed the dorsal lumbar fascia in the midline placed percutaneous pedicle screws bilaterally at L3-4 without difficulty. We passed a Jamshidi needle with the ultrasonic guidance down the pedicle without difficulty. We tapped with a 6 mm tap after placing guidewires and removing the needle. At L4 we placed 6.5 x 40 mm screws at L3 we placed 6.5 x 45 mm screws. First we showed them to be in good position. We chose appropriately length rods and passed down the towers without difficulty. Secure them to the top of the screws with top loading nuts. We did tighten and final tightening torque and counter torque and then remove the towers. Arthroscopy and AP and lateral direction with excellent. We irrigated all wounds and completed the closure of the dorsal lumbar fashion the midline and then did the same over the percutaneous screw incisions. We then irrigated again and closed the rest of the wound with interrupted Vicryl on the subcutaneous and subcuticular tissues. We did a running locking Prolene on the skin. Shortness was then applied and the patient was extubated and  taken to recovery room in stable condition.

## 2014-06-03 NOTE — Progress Notes (Signed)
Utilization review completed.  

## 2014-06-04 MED ORDER — OXYCODONE HCL 10 MG PO TABS: 10.0000 mg | ORAL_TABLET | ORAL | Status: DC | PRN

## 2014-06-04 NOTE — Progress Notes (Signed)
Patient alert and oriented, mae's well, voiding adequate amount of urine, swallowing without difficulty, c/o moderate pain and meds given prior to discharged. Patient discharged home with family. Script and discharged instructions given to patient. Patient and family stated understanding of instructions given. Patient has F/U appointment with MD in 2 weeks. Marin RobertsAisha Shamir Sedlar RN.

## 2014-06-04 NOTE — Discharge Summary (Signed)
  Physician Discharge Summary  Patient ID: Brendan GreenhouseDouglas B Boomer MRN: [id number] DOB/AGE: [birth year] 60 y.o.  Admit date: 06/03/2014 Discharge date: 06/04/2014  Admission Diagnoses:  Discharge Diagnoses:  Active Problems:   Spondylolisthesis at L3-L4 level   Discharged Condition: good  Hospital Course: Surgery Tuesday for plif at L 34. Did very well. No leg pain post op. Ambulated well. Home pod 1, specific instructions given.  Consults: None  Significant Diagnostic Studies: none  Treatments: surgery: L 34 plif  Discharge Exam: Blood pressure 117/67, pulse 83, temperature 98.4 F (36.9 C), temperature source Oral, resp. rate 18, weight 71.668 kg (158 lb), SpO2 99.00%. Incision/Wound:clean and dry; no new neuro issues  Disposition:      Medication List    ASK your doctor about these medications       ALPRAZolam 0.5 MG tablet  Commonly known as:  XANAX  Take 0.5 mg by mouth 3 (three) times daily as needed for anxiety.     aspirin 81 MG tablet  Take 81 mg by mouth daily.     HYDROcodone-acetaminophen 10-325 MG per tablet  Commonly known as:  NORCO  Take 1 tablet by mouth every 6 (six) hours as needed for moderate pain or severe pain.         At home rest most of the time. Get up 9 or 10 times each day and take a 15 or 20 minute walk. No riding in the car and to your first postoperative appointment. If you have neck surgery you may shower from the chest down starting on the third postoperative day. If you had back surgery he may start showering on the third postoperative day with saran wrap wrapped around your incisional area 3 times. After the shower remove the saran wrap. Take pain medicine as needed and other medications as instructed. Call my office for an appointment.  SignedReinaldo Meeker: Khyra Viscuso O, MD 06/04/2014, 8:46 AM

## 2014-10-22 ENCOUNTER — Other Ambulatory Visit (HOSPITAL_COMMUNITY): Payer: Self-pay | Admitting: Physician Assistant

## 2014-10-22 ENCOUNTER — Ambulatory Visit (HOSPITAL_COMMUNITY)
Admission: RE | Admit: 2014-10-22 | Discharge: 2014-10-22 | Disposition: A | Discharge status: Home / Self Care | Payer: Medicaid Other | Source: Ambulatory Visit | Attending: Physician Assistant | Admitting: Physician Assistant

## 2014-10-22 DIAGNOSIS — R0602 Shortness of breath: Secondary | ICD-10-CM

## 2014-10-22 DIAGNOSIS — F17210 Nicotine dependence, cigarettes, uncomplicated: Secondary | ICD-10-CM

## 2016-04-08 DIAGNOSIS — Z1389 Encounter for screening for other disorder: Secondary | ICD-10-CM | POA: Diagnosis not present

## 2016-04-08 DIAGNOSIS — Z6821 Body mass index (BMI) 21.0-21.9, adult: Secondary | ICD-10-CM | POA: Diagnosis not present

## 2016-04-08 DIAGNOSIS — E782 Mixed hyperlipidemia: Secondary | ICD-10-CM | POA: Diagnosis not present

## 2016-04-08 DIAGNOSIS — G894 Chronic pain syndrome: Secondary | ICD-10-CM | POA: Diagnosis not present

## 2016-05-24 DIAGNOSIS — Z6822 Body mass index (BMI) 22.0-22.9, adult: Secondary | ICD-10-CM | POA: Diagnosis not present

## 2016-05-24 DIAGNOSIS — Z0000 Encounter for general adult medical examination without abnormal findings: Secondary | ICD-10-CM | POA: Diagnosis not present

## 2016-05-24 DIAGNOSIS — G894 Chronic pain syndrome: Secondary | ICD-10-CM | POA: Diagnosis not present

## 2016-05-24 DIAGNOSIS — Z1389 Encounter for screening for other disorder: Secondary | ICD-10-CM | POA: Diagnosis not present

## 2016-09-21 DIAGNOSIS — Z6823 Body mass index (BMI) 23.0-23.9, adult: Secondary | ICD-10-CM | POA: Diagnosis not present

## 2016-09-21 DIAGNOSIS — E782 Mixed hyperlipidemia: Secondary | ICD-10-CM | POA: Diagnosis not present

## 2016-09-21 DIAGNOSIS — I10 Essential (primary) hypertension: Secondary | ICD-10-CM | POA: Diagnosis not present

## 2016-09-21 DIAGNOSIS — G894 Chronic pain syndrome: Secondary | ICD-10-CM | POA: Diagnosis not present

## 2016-09-21 DIAGNOSIS — Z1389 Encounter for screening for other disorder: Secondary | ICD-10-CM | POA: Diagnosis not present

## 2016-12-19 DIAGNOSIS — G894 Chronic pain syndrome: Secondary | ICD-10-CM | POA: Diagnosis not present

## 2016-12-19 DIAGNOSIS — Z6823 Body mass index (BMI) 23.0-23.9, adult: Secondary | ICD-10-CM | POA: Diagnosis not present

## 2016-12-21 ENCOUNTER — Telehealth: Payer: Self-pay

## 2016-12-21 DIAGNOSIS — Z1211 Encounter for screening for malignant neoplasm of colon: Secondary | ICD-10-CM | POA: Diagnosis not present

## 2016-12-21 NOTE — Telephone Encounter (Signed)
[facility]. However, his date of birth was ( year) was different in computer. 1956 in computer and referral said [id number]. I called pt to confirm date of birth and it was 10-02-1955 so Brendan MallardCamille made the change. Pt said he had a previous colonoscopy, but not sure when. I have faxed Medical Records for report.

## 2017-01-05 ENCOUNTER — Telehealth: Payer: Self-pay

## 2017-01-05 NOTE — Telephone Encounter (Signed)
[facility] Last colonoscopy was done 11/08/2005 by Dr. Lovell SheehanJenkins.  IT was normal and next recommended in 10 years.

## 2017-01-05 NOTE — Telephone Encounter (Signed)
Will need OV for possible augmentated sedation due to polypharmacy.

## 2017-01-05 NOTE — Telephone Encounter (Signed)
Gastroenterology Pre-Procedure Review  Request Date: 01/05/2017 Requesting Physician: Dr. Sherwood GamblerFusco  PATIENT REVIEW QUESTIONS: The patient responded to the following health history questions as indicated:    1. Diabetes Melitis: no 2. Joint replacements in the past 12 months: no 3. Major health problems in the past 3 months: no 4. Has an artificial valve or MVP: no 5. Has a defibrillator: no 6. Has been advised in past to take antibiotics in advance of a procedure like teeth cleaning: no 7. Family history of colon cancer: no  8. Alcohol Use: Rarely 9. History of sleep apnea: no  10. History of coronary artery or other vascular stents placed within the last 12 months: no    MEDICATIONS & ALLERGIES:    Patient reports the following regarding taking any blood thinners:   Plavix? no Aspirin? no Coumadin? no Brilinta? no Xarelto? no Eliquis? no Pradaxa? no Savaysa? no Effient? no  Patient confirms/reports the following medications:  Current Outpatient Prescriptions  Medication Sig Dispense Refill  . ALPRAZolam (XANAX) 0.5 MG tablet Take 0.5 mg by mouth 3 (three) times daily as needed for anxiety.    Marland Kitchen. HYDROcodone-acetaminophen (NORCO) 10-325 MG tablet Take 1 tablet by mouth every 6 (six) hours as needed.     No current facility-administered medications for this visit.     Patient confirms/reports the following allergies:  Allergies  Allergen Reactions  . Codeine Itching and Nausea Only    No orders of the defined types were placed in this encounter.   AUTHORIZATION INFORMATION Primary Insurance:   ID #:  Group #:  Pre-Cert / Auth required Pre-Cert / Auth #:   Secondary Insurance:   ID #: ,  Group #:  Pre-Cert / Auth required:  Pre-Cert / Auth #:   SCHEDULE INFORMATION: Procedure has been scheduled as follows:  Date:  Time:   Location:   This Gastroenterology Pre-Precedure Review Form is being routed to the following provider(s): R. Roetta SessionsMichael Rourk, MD

## 2017-01-05 NOTE — Telephone Encounter (Signed)
See separate triage.  

## 2017-01-06 NOTE — Telephone Encounter (Signed)
Pt has been scheduled an OV appt with Tana CoastLeslie Lewis, PA on 02/14/2017 at 10:00 AM.

## 2017-02-14 ENCOUNTER — Telehealth: Payer: Self-pay

## 2017-02-14 ENCOUNTER — Encounter: Payer: Self-pay | Admitting: Gastroenterology

## 2017-02-14 ENCOUNTER — Other Ambulatory Visit: Payer: Self-pay

## 2017-02-14 ENCOUNTER — Ambulatory Visit (INDEPENDENT_AMBULATORY_CARE_PROVIDER_SITE_OTHER): Payer: Medicare Other | Admitting: Gastroenterology

## 2017-02-14 DIAGNOSIS — B182 Chronic viral hepatitis C: Secondary | ICD-10-CM

## 2017-02-14 DIAGNOSIS — Z7902 Long term (current) use of antithrombotics/antiplatelets: Secondary | ICD-10-CM | POA: Insufficient documentation

## 2017-02-14 DIAGNOSIS — Z1211 Encounter for screening for malignant neoplasm of colon: Secondary | ICD-10-CM | POA: Diagnosis not present

## 2017-02-14 DIAGNOSIS — B1920 Unspecified viral hepatitis C without hepatic coma: Secondary | ICD-10-CM | POA: Insufficient documentation

## 2017-02-14 MED ORDER — NA SULFATE-K SULFATE-MG SULF 17.5-3.13-1.6 GM/177ML PO SOLN: 1.0000 | ORAL | 0 refills | Status: DC

## 2017-02-14 NOTE — Assessment & Plan Note (Signed)
Due for screening colonoscopy. Plan for deep sedation in the OR given chronic narcotic use and history of failed conscious sedation.  I have discussed the risks, alternatives, benefits with regards to but not limited to the risk of reaction to medication, bleeding, infection, perforation and the patient is agreeable to proceed. Written consent to be obtained.

## 2017-02-14 NOTE — Patient Instructions (Signed)
PA info for colonoscopy submitted via [facility]. No PA needed. Decision ID# [id number].

## 2017-02-14 NOTE — Assessment & Plan Note (Signed)
History of chronic hepatitis C, further details unavailable. Patient reports he was never treated. Liver biopsy in 2007 with no fibrosis. As outlined above, extensive discussion today regarding potential treatment options, increased risk of developing cirrhosis in untreated hepatitis C as well as increased risk of hepatocellular carcinoma. Handout provided to patient today. He'll discuss further with his PCP and let us know if he would like us to pursue management. He was not ready to make a decision today.

## 2017-02-14 NOTE — Progress Notes (Signed)
cc'ed to pcp °

## 2017-02-14 NOTE — Patient Instructions (Signed)
1. Colonoscopy with Dr. Darrick PennaFields as scheduled. See separate instructions.  2. Please discuss consideration of Hepatitis C treatment and further evaluation to reassess extent of liver disease due to Hepatitis C. Harvoni information provided today. Let us know if you would like further assistance.

## 2017-02-14 NOTE — Telephone Encounter (Signed)
Called and informed pt of pre-op appt 03/08/17 at 9:00am. Letter also mailed.

## 2017-02-14 NOTE — Progress Notes (Signed)
Primary Care Physician:  Elfredia Nevins, MD  Primary Gastroenterologist:  Jonette Eva, MD   Chief Complaint  Patient presents with  . Colonoscopy    HPI:  Brendan Charles is a 60 y.o. male here to schedule screening colonoscopy. His last one was in 2007 with Dr. Lovell Sheehan and was normal. Patient states he had inadequate conscious sedation. From a GI standpoint, his bowel function is normal. One [place] 4 stool daily. No blood in the stool or melena. Denies constipation. No abdominal pain. Occasional heartburn if he eats the wrong things. No dysphagia or vomiting.  I noticed he has had evaluation for hepatitis C back in 2007 by Dr. Brooke Dare. He had a liver biopsy with mild active hepatitis consistent with hepatitis C, no fibrosis. Patient states he was offered an experimental drug at the time but he declined. He's had no follow-up since then. We discussed potential progression to cirrhosis in the setting of chronic hepatitis C. We discussed increased risk of liver cancer in hepatitis C patients with and without cirrhosis. We discussed various potential treatment options and what would be involved. He would like to think about it and requested reading material. He will discuss further with Dr. Sherwood Gambler before making a decision. He will let us know if he would like [place] to pursue management of his hepatitis C.    Current Outpatient Prescriptions  Medication Sig Dispense Refill  . ALPRAZolam (XANAX) 0.5 MG tablet Take 0.5 mg by mouth 3 (three) times daily as needed for anxiety.    Marland Kitchen HYDROcodone-acetaminophen (NORCO) 10-325 MG tablet Take 1 tablet by mouth every 6 (six) hours.      No current facility-administered medications for this visit.     Allergies as of 02/14/2017 - Review Complete 02/14/2017  Allergen Reaction Noted  . Codeine Itching and Nausea Only 05/12/2014    Past Medical History:  Diagnosis Date  . Anxiety   . Hepatitis C    HEP C, never treated, 2007    Past Surgical  History:  Procedure Laterality Date  . CERVICAL DISC SURGERY  2001   anterior  . LUMBAR SPINE SURGERY  2015    Family History  Problem Relation Age of Onset  . Colon cancer Neg Hx     Social History   Social History  . Marital status: Divorced    Spouse name: N/A  . Number of children: N/A  . Years of education: N/A   Occupational History  . Not on file.   Social History Main Topics  . Smoking status: Current Every Day Smoker    Packs/day: 0.50    Years: 50.00    Types: Cigarettes  . Smokeless tobacco: Former Neurosurgeon  . Alcohol use No  . Drug use: No  . Sexual activity: Not on file   Other Topics Concern  . Not on file   Social History Narrative  . No narrative on file      ROS:  General: Negative for anorexia, weight loss, fever, chills, fatigue, weakness. Eyes: Negative for vision changes.  ENT: Negative for hoarseness, difficulty swallowing , nasal congestion. CV: Negative for chest pain, angina, palpitations, dyspnea on exertion, peripheral edema.  Respiratory: Negative for dyspnea at rest, dyspnea on exertion, cough, sputum, wheezing.  GI: See history of present illness. GU:  Negative for dysuria, hematuria, urinary incontinence, urinary frequency, nocturnal urination.  MS: Negative for joint pain, +low back pain.  Derm: Negative for rash or itching.  Neuro: Negative for weakness, abnormal sensation, seizure,  frequent headaches, memory loss, confusion.  Psych: Negative for anxiety, depression, suicidal ideation, hallucinations.  Endo: Negative for unusual weight change.  Heme: Negative for bruising or bleeding. Allergy: Negative for rash or hives.    Physical Examination:  BP (!) 155/91   Pulse 89   Temp 97.6 F (36.4 C) (Oral)   Ht 5\' 9"  (1.753 m)   Wt 157 lb (71.2 kg)   BMI 23.18 kg/m    General: Well-nourished, well-developed in no acute distress.  Head: Normocephalic, atraumatic.   Eyes: Conjunctiva pink, no icterus. Mouth: Oropharyngeal  mucosa moist and pink , no lesions erythema or exudate. Neck: Supple without thyromegaly, masses, or lymphadenopathy.  Lungs: Clear to auscultation bilaterally.  Heart: Regular rate and rhythm, no murmurs rubs or gallops.  Abdomen: Bowel sounds are normal, nontender, nondistended, no hepatosplenomegaly or masses, no abdominal bruits or    hernia , no rebound or guarding.   Rectal: not performed Extremities: No lower extremity edema. No clubbing or deformities.  Neuro: Alert and oriented x 4 , grossly normal neurologically.  Skin: Warm and dry, no rash or jaundice.   Psych: Alert and cooperative, normal mood and affect  Imaging Studies: No results found.

## 2017-02-16 ENCOUNTER — Telehealth: Payer: Self-pay

## 2017-02-16 NOTE — Telephone Encounter (Signed)
Opened in error

## 2017-03-02 NOTE — Patient Instructions (Signed)
Brendan GreenhouseDouglas B Charles  03/02/2017     @PREFPERIOPPHARMACY @   Your procedure is scheduled on  03/14/2017.  [facility]   [facility].M.  Call this number if you have problems the morning of surgery:  [phone]   Remember:  Do not eat food or drink liquids after midnight.  Take these medicines the morning of surgery with A SIP OF WATER Xanax, hydrocodone.   Do not wear jewelry, make-up or nail polish.  Do not wear lotions, powders, or perfumes, or deoderant.  Do not shave 48 hours prior to surgery.  Men may shave face and neck.  Do not bring valuables to the hospital.  [facility] not [facility] any belongings or valuables.  Contacts, dentures or bridgework may not be worn into surgery.  Leave your suitcase in the car.  After surgery it may be brought to your room.  For patients admitted to the hospital, discharge time will be determined by your treatment team.  Patients discharged the day of surgery will not be allowed to drive home.   Name and phone number of your driver:   family Special instructions:  Follow the diet and prep instructions given to you by Dr Evelina DunField's office.  Please read over the following fact sheets that you were given. Anesthesia Post-op Instructions and Care and Recovery After Surgery       Colonoscopy, Adult A colonoscopy is an exam to look at the entire large intestine. During the exam, a lubricated, bendable tube is inserted into the anus and then passed into the rectum, colon, and other parts of the large intestine. A colonoscopy is often done as a part of normal colorectal screening or in response to certain symptoms, such as anemia, persistent diarrhea, abdominal pain, and blood in the stool. The exam can help screen for and diagnose medical problems, including:  Tumors.  Polyps.  Inflammation.  Areas of bleeding.  Tell a health care provider about:  Any allergies you have.  All medicines you are taking,  including vitamins, herbs, eye drops, creams, and over-the-counter medicines.  Any problems you or family members have had with anesthetic medicines.  Any blood disorders you have.  Any surgeries you have had.  Any medical conditions you have.  Any problems you have had passing stool. What are the risks? Generally, this is a safe procedure. However, problems may occur, including:  Bleeding.  A tear in the intestine.  A reaction to medicines given during the exam.  Infection (rare).  What happens before the procedure? Eating and drinking restrictions Follow instructions from your health care provider about eating and drinking, which may include:  A few days before the procedure - follow a low-fiber diet. Avoid nuts, seeds, dried fruit, raw fruits, and vegetables.  1-3 days before the procedure - follow a clear liquid diet. Drink only clear liquids, such as clear broth or bouillon, black coffee or tea, clear juice, clear soft drinks or sports drinks, gelatin dessert, and popsicles. Avoid any liquids that contain red or purple dye.  On the day of the procedure - do not eat or drink anything during the 2 hours before the procedure, or within the time period that your health care provider recommends.  Bowel prep If you were prescribed an oral bowel prep to clean out your colon:  Take it as told by your health care provider. Starting the day before your procedure, you will  need to drink a large amount of medicated liquid. The liquid will cause you to have multiple loose stools until your stool is almost clear or light green.  If your skin or anus gets irritated from diarrhea, you may use these to relieve the irritation: ? Medicated wipes, such as adult wet wipes with aloe and vitamin E. ? A skin soothing-product like petroleum jelly.  If you vomit while drinking the bowel prep, take a break for up to 60 minutes and then begin the bowel prep again. If vomiting continues and you  cannot take the bowel prep without vomiting, call your health care provider.  General instructions  Ask your health care provider about changing or stopping your regular medicines. This is especially important if you are taking diabetes medicines or blood thinners.  Plan to have someone take you home from the hospital or clinic. What happens during the procedure?  An IV tube may be inserted into one of your veins.  You will be given medicine to help you relax (sedative).  To reduce your risk of infection: ? Your health care team will wash or sanitize their hands. ? Your anal area will be washed with soap.  You will be asked to lie on your side with your knees bent.  Your health care provider will lubricate a long, thin, flexible tube. The tube will have a camera and a light on the end.  The tube will be inserted into your anus.  The tube will be gently eased through your rectum and colon.  Air will be delivered into your colon to keep it open. You may feel some pressure or cramping.  The camera will be used to take images during the procedure.  A small tissue sample may be removed from your body to be examined under a microscope (biopsy). If any potential problems are found, the tissue will be sent to a lab for testing.  If small polyps are found, your health care provider may remove them and have them checked for cancer cells.  The tube that was inserted into your anus will be slowly removed. The procedure may vary among health care providers and hospitals. What happens after the procedure?  Your blood pressure, heart rate, breathing rate, and blood oxygen level will be monitored until the medicines you were given have worn off.  Do not drive for 24 hours after the exam.  You may have a small amount of blood in your stool.  You may pass gas and have mild abdominal cramping or bloating due to the air that was used to inflate your colon during the exam.  It is up to you to  get the results of your procedure. Ask your health care provider, or the department performing the procedure, when your results will be ready. This information is not intended to replace advice given to you by your health care provider. Make sure you discuss any questions you have with your health care provider. Document Released: 07/22/2000 Document Revised: 05/25/2016 Document Reviewed: 10/06/2015 Elsevier Interactive Patient Education  2018 [employer].  Colonoscopy, Adult, Care After This sheet gives you information about how to care for yourself after your procedure. Your health care provider may also give you more specific instructions. If you have problems or questions, contact your health care provider. What can I expect after the procedure? After the procedure, it is common to have:  A small amount of blood in your stool for 24 hours after the procedure.  Some gas.  Mild abdominal cramping or bloating.  Follow these instructions at home: General instructions   For the first 24 hours after the procedure: ? Do not drive or use machinery. ? Do not sign important documents. ? Do not drink alcohol. ? Do your regular daily activities at a slower pace than normal. ? Eat soft, easy-to-digest foods. ? Rest often.  Take over-the-counter or prescription medicines only as told by your health care provider.  It is up to you to get the results of your procedure. Ask your health care provider, or the department performing the procedure, when your results will be ready. Relieving cramping and bloating  Try walking around when you have cramps or feel bloated.  Apply heat to your abdomen as told by your health care provider. Use a heat source that your health care provider recommends, such as a moist heat pack or a heating pad. ? Place a towel between your skin and the heat source. ? Leave the heat on for 20-30 minutes. ? Remove the heat if your skin turns bright red. This is  especially important if you are unable to feel pain, heat, or cold. You may have a greater risk of getting burned. Eating and drinking  Drink enough fluid to keep your urine clear or pale yellow.  Resume your normal diet as instructed by your health care provider. Avoid heavy or fried foods that are hard to digest.  Avoid drinking alcohol for as long as instructed by your health care provider. Contact a health care provider if:  You have blood in your stool 2-3 days after the procedure. Get help right away if:  You have more than a small spotting of blood in your stool.  You pass large blood clots in your stool.  Your abdomen is swollen.  You have nausea or vomiting.  You have a fever.  You have increasing abdominal pain that is not relieved with medicine. This information is not intended to replace advice given to you by your health care provider. Make sure you discuss any questions you have with your health care provider. Document Released: 03/08/2004 Document Revised: 04/18/2016 Document Reviewed: 10/06/2015 Elsevier Interactive Patient Education  2018 [place] Anesthesia is a term that refers to techniques, procedures, and medicines that help a person stay safe and comfortable during a medical procedure. Monitored anesthesia care, or sedation, is one type of anesthesia. Your anesthesia specialist may recommend sedation if you will be having a procedure that does not require you to be unconscious, such as:  Cataract surgery.  A dental procedure.  A biopsy.  A colonoscopy.  During the procedure, you may receive a medicine to help you relax (sedative). There are three levels of sedation:  Mild sedation. At this level, you may feel awake and relaxed. You will be able to follow directions.  Moderate sedation. At this level, you will be sleepy. You may not remember the procedure.  Deep sedation. At this level, you will be asleep. You will  not remember the procedure.  The more medicine you are given, the deeper your level of sedation will be. Depending on how you respond to the procedure, the anesthesia specialist may change your level of sedation or the type of anesthesia to fit your needs. An anesthesia specialist will monitor you closely during the procedure. Let your health care provider know about:  Any allergies you have.  All medicines you are taking, including vitamins, herbs, eye drops, creams, and over-the-counter medicines.  Any use of steroids (by mouth or as a cream).  Any problems you or family members have had with sedatives and anesthetic medicines.  Any blood disorders you have.  Any surgeries you have had.  Any medical conditions you have, such as sleep apnea.  Whether you are pregnant or may be pregnant.  Any use of cigarettes, alcohol, or street drugs. What are the risks? Generally, this is a safe procedure. However, problems may occur, including:  Getting too much medicine (oversedation).  Nausea.  Allergic reaction to medicines.  Trouble breathing. If this happens, a breathing tube may be used to help with breathing. It will be removed when you are awake and breathing on your own.  Heart trouble.  Lung trouble.  Before the procedure Staying hydrated Follow instructions from your health care provider about hydration, which may include:  Up to 2 hours before the procedure - you may continue to drink clear liquids, such as water, clear fruit juice, black coffee, and plain tea.  Eating and drinking restrictions Follow instructions from your health care provider about eating and drinking, which may include:  8 hours before the procedure - stop eating heavy meals or foods such as meat, fried foods, or fatty foods.  6 hours before the procedure - stop eating light meals or foods, such as toast or cereal.  6 hours before the procedure - stop drinking milk or drinks that contain milk.  2  hours before the procedure - stop drinking clear liquids.  Medicines Ask your health care provider about:  Changing or stopping your regular medicines. This is especially important if you are taking diabetes medicines or blood thinners.  Taking medicines such as aspirin and ibuprofen. These medicines can thin your blood. Do not take these medicines before your procedure if your health care provider instructs you not to.  Tests and exams  You will have a physical exam.  You may have blood tests done to show: ? How well your kidneys and liver are working. ? How well your blood can clot.  General instructions  Plan to have someone take you home from the hospital or clinic.  If you will be going home right after the procedure, plan to have someone with you for 24 hours.  What happens during the procedure?  Your blood pressure, heart rate, breathing, level of pain and overall condition will be monitored.  An IV tube will be inserted into one of your veins.  Your anesthesia specialist will give you medicines as needed to keep you comfortable during the procedure. This may mean changing the level of sedation.  The procedure will be performed. After the procedure  Your blood pressure, heart rate, breathing rate, and blood oxygen level will be monitored until the medicines you were given have worn off.  Do not drive for 24 hours if you received a sedative.  You may: ? Feel sleepy, clumsy, or nauseous. ? Feel forgetful about what happened after the procedure. ? Have a sore throat if you had a breathing tube during the procedure. ? Vomit. This information is not intended to replace advice given to you by your health care provider. Make sure you discuss any questions you have with your health care provider. Document Released: 04/20/2005 Document Revised: 01/01/2016 Document Reviewed: 11/15/2015 Elsevier Interactive Patient Education  2018 [place],  Care After These instructions provide you with information about caring for yourself after your procedure. Your health care provider may also give you more  specific instructions. Your treatment has been planned according to current medical practices, but problems sometimes occur. Call your health care provider if you have any problems or questions after your procedure. What can I expect after the procedure? After your procedure, it is common to:  Feel sleepy for several hours.  Feel clumsy and have poor balance for several hours.  Feel forgetful about what happened after the procedure.  Have poor judgment for several hours.  Feel nauseous or vomit.  Have a sore throat if you had a breathing tube during the procedure.  Follow these instructions at home: For at least 24 hours after the procedure:   Do not: ? Participate in activities in which you could fall or become injured. ? Drive. ? Use heavy machinery. ? Drink alcohol. ? Take sleeping pills or medicines that cause drowsiness. ? Make important decisions or sign legal documents. ? Take care of children on your own.  Rest. Eating and drinking  Follow the diet that is recommended by your health care provider.  If you vomit, drink water, juice, or soup when you can drink without vomiting.  Make sure you have little or no nausea before eating solid foods. General instructions  Have a responsible adult stay with you until you are awake and alert.  Take over-the-counter and prescription medicines only as told by your health care provider.  If you smoke, do not smoke without supervision.  Keep all follow-up visits as told by your health care provider. This is important. Contact a health care provider if:  You keep feeling nauseous or you keep vomiting.  You feel light-headed.  You develop a rash.  You have a fever. Get help right away if:  You have trouble breathing. This information is not intended to replace  advice given to you by your health care provider. Make sure you discuss any questions you have with your health care provider. Document Released: 11/15/2015 Document Revised: 03/16/2016 Document Reviewed: 11/15/2015 Elsevier Interactive Patient Education  [employer].

## 2017-03-07 DIAGNOSIS — Z1389 Encounter for screening for other disorder: Secondary | ICD-10-CM | POA: Diagnosis not present

## 2017-03-07 DIAGNOSIS — E782 Mixed hyperlipidemia: Secondary | ICD-10-CM | POA: Diagnosis not present

## 2017-03-07 DIAGNOSIS — G894 Chronic pain syndrome: Secondary | ICD-10-CM | POA: Diagnosis not present

## 2017-03-07 DIAGNOSIS — Z6822 Body mass index (BMI) 22.0-22.9, adult: Secondary | ICD-10-CM | POA: Diagnosis not present

## 2017-03-07 DIAGNOSIS — I10 Essential (primary) hypertension: Secondary | ICD-10-CM | POA: Diagnosis not present

## 2017-03-08 ENCOUNTER — Encounter (HOSPITAL_COMMUNITY)
Admission: RE | Admit: 2017-03-08 | Discharge: 2017-03-08 | Disposition: A | Discharge status: Home / Self Care | Payer: Medicare Other | Source: Ambulatory Visit | Attending: Gastroenterology | Admitting: Gastroenterology

## 2017-03-08 ENCOUNTER — Encounter (HOSPITAL_COMMUNITY): Payer: Self-pay

## 2017-03-08 DIAGNOSIS — Z01818 Encounter for other preprocedural examination: Secondary | ICD-10-CM | POA: Diagnosis not present

## 2017-03-08 DIAGNOSIS — Z1211 Encounter for screening for malignant neoplasm of colon: Secondary | ICD-10-CM | POA: Insufficient documentation

## 2017-03-08 DIAGNOSIS — Z01810 Encounter for preprocedural cardiovascular examination: Secondary | ICD-10-CM | POA: Diagnosis not present

## 2017-03-08 HISTORY — DX: Other chronic pain: G89.29

## 2017-03-08 HISTORY — DX: Dorsalgia, unspecified: M54.9

## 2017-03-08 LAB — BASIC METABOLIC PANEL
Anion gap: 11 (ref 5–15)
BUN: 11 mg/dL (ref 6–20)
CALCIUM: 9.3 mg/dL (ref 8.9–10.3)
CHLORIDE: 106 mmol/L (ref 101–111)
CO2: 24 mmol/L (ref 22–32)
CREATININE: 0.98 mg/dL (ref 0.61–1.24)
GFR calc Af Amer: >60 mL/min (ref >60)
GFR calc non Af Amer: >60 mL/min (ref >60)
Glucose, Bld: 130 mg/dL — ABNORMAL HIGH (ref 65–99)
Potassium: 3.5 mmol/L (ref 3.5–5.1)
Sodium: 141 mmol/L (ref 135–145)

## 2017-03-08 LAB — CBC WITH DIFFERENTIAL/PLATELET
BASOS PCT: 0 %
Basophils Absolute: 0 10*3/uL (ref 0.0–0.1)
EOS ABS: 0.1 10*3/uL (ref 0.0–0.7)
Eosinophils Relative: 1 %
HEMATOCRIT: 46 % (ref 39.0–52.0)
HEMOGLOBIN: 15.6 g/dL (ref 13.0–17.0)
LYMPHS ABS: 3.7 10*3/uL (ref 0.7–4.0)
Lymphocytes Relative: 32 %
MCH: 30.8 pg (ref 26.0–34.0)
MCHC: 33.9 g/dL (ref 30.0–36.0)
MCV: 90.7 fL (ref 78.0–100.0)
Monocytes Absolute: 0.6 10*3/uL (ref 0.1–1.0)
Monocytes Relative: 5 %
NEUTROS ABS: 7.1 10*3/uL (ref 1.7–7.7)
NEUTROS PCT: 62 %
Platelets: 323 10*3/uL (ref 150–400)
RBC: 5.07 MIL/uL (ref 4.22–5.81)
RDW: 13.5 % (ref 11.5–15.5)
WBC: 11.6 10*3/uL — ABNORMAL HIGH (ref 4.0–10.5)

## 2017-03-12 NOTE — Progress Notes (Signed)
REVIEWED-NO ADDITIONAL RECOMMENDATIONS. 

## 2017-03-14 ENCOUNTER — Ambulatory Visit (HOSPITAL_COMMUNITY): Payer: Medicare Other | Admitting: Anesthesiology

## 2017-03-14 ENCOUNTER — Ambulatory Visit (HOSPITAL_COMMUNITY)
Admission: RE | Admit: 2017-03-14 | Discharge: 2017-03-14 | Disposition: A | Discharge status: Home / Self Care | Payer: Medicare Other | Source: Ambulatory Visit | Attending: Gastroenterology | Admitting: Gastroenterology

## 2017-03-14 ENCOUNTER — Encounter (HOSPITAL_COMMUNITY): Payer: Self-pay | Admitting: *Deleted

## 2017-03-14 ENCOUNTER — Encounter (HOSPITAL_COMMUNITY)
Admission: RE | Disposition: A | Discharge status: Home / Self Care | Payer: Self-pay | Source: Ambulatory Visit | Attending: Gastroenterology

## 2017-03-14 DIAGNOSIS — K621 Rectal polyp: Secondary | ICD-10-CM | POA: Diagnosis not present

## 2017-03-14 DIAGNOSIS — Z1211 Encounter for screening for malignant neoplasm of colon: Secondary | ICD-10-CM | POA: Diagnosis not present

## 2017-03-14 DIAGNOSIS — F419 Anxiety disorder, unspecified: Secondary | ICD-10-CM | POA: Insufficient documentation

## 2017-03-14 DIAGNOSIS — F17210 Nicotine dependence, cigarettes, uncomplicated: Secondary | ICD-10-CM | POA: Insufficient documentation

## 2017-03-14 DIAGNOSIS — K6289 Other specified diseases of anus and rectum: Secondary | ICD-10-CM | POA: Diagnosis not present

## 2017-03-14 DIAGNOSIS — G8929 Other chronic pain: Secondary | ICD-10-CM | POA: Diagnosis not present

## 2017-03-14 DIAGNOSIS — K644 Residual hemorrhoidal skin tags: Secondary | ICD-10-CM | POA: Diagnosis not present

## 2017-03-14 DIAGNOSIS — Z79899 Other long term (current) drug therapy: Secondary | ICD-10-CM | POA: Diagnosis not present

## 2017-03-14 DIAGNOSIS — B1920 Unspecified viral hepatitis C without hepatic coma: Secondary | ICD-10-CM | POA: Insufficient documentation

## 2017-03-14 DIAGNOSIS — Z1212 Encounter for screening for malignant neoplasm of rectum: Secondary | ICD-10-CM

## 2017-03-14 DIAGNOSIS — K648 Other hemorrhoids: Secondary | ICD-10-CM | POA: Diagnosis not present

## 2017-03-14 DIAGNOSIS — M549 Dorsalgia, unspecified: Secondary | ICD-10-CM | POA: Insufficient documentation

## 2017-03-14 HISTORY — PX: POLYPECTOMY: SHX5525

## 2017-03-14 HISTORY — PX: COLONOSCOPY WITH PROPOFOL: SHX5780

## 2017-03-14 SURGERY — COLONOSCOPY WITH PROPOFOL
Anesthesia: Monitor Anesthesia Care

## 2017-03-14 MED ORDER — FENTANYL CITRATE (PF) 100 MCG/2ML IJ SOLN
25.0000 ug | Freq: Once | INTRAMUSCULAR | Status: AC
  Administered 2017-03-14: 25 ug via INTRAVENOUS

## 2017-03-14 MED ORDER — CHLORHEXIDINE GLUCONATE CLOTH 2 % EX PADS: 6.0000 | MEDICATED_PAD | Freq: Once | CUTANEOUS | Status: DC

## 2017-03-14 MED ORDER — LACTATED RINGERS IV SOLN
INTRAVENOUS | Status: DC
  Administered 2017-03-14: 07:00:00 via INTRAVENOUS

## 2017-03-14 MED ORDER — FENTANYL CITRATE (PF) 100 MCG/2ML IJ SOLN
INTRAMUSCULAR | Status: AC
  Filled 2017-03-14: qty 2

## 2017-03-14 MED ORDER — PROPOFOL 500 MG/50ML IV EMUL
INTRAVENOUS | Status: DC | PRN
  Administered 2017-03-14: 150 ug/kg/min via INTRAVENOUS

## 2017-03-14 MED ORDER — MIDAZOLAM HCL 2 MG/2ML IJ SOLN
1.0000 mg | INTRAMUSCULAR | Status: AC
  Administered 2017-03-14: 2 mg via INTRAVENOUS

## 2017-03-14 MED ORDER — MIDAZOLAM HCL 2 MG/2ML IJ SOLN
INTRAMUSCULAR | Status: AC
  Filled 2017-03-14: qty 2

## 2017-03-14 NOTE — Anesthesia Preprocedure Evaluation (Signed)
Anesthesia Evaluation  Patient identified by MRN, date of birth, ID band Patient awake    Reviewed: Allergy & Precautions, H&P , NPO status , Patient's Chart, lab work & pertinent test results  Airway Mallampati: II   Neck ROM: Full    Dental  (+) Missing, Dental Advisory Given, Poor Dentition, Chipped   Pulmonary Current Smoker,  Current smoker, 25 pack year   breath sounds clear to auscultation       Cardiovascular negative cardio ROS   Rhythm:Regular Rate:Normal     Neuro/Psych Anxiety Xanax   GI/Hepatic (+) Hepatitis -  Endo/Other    Renal/GU      Musculoskeletal  (+) Arthritis ,   Abdominal (+)  Abdomen: soft.    Peds  Hematology   Anesthesia Other Findings Chronic pain   Reproductive/Obstetrics                             Anesthesia Physical Anesthesia Plan  ASA: III  Anesthesia Plan: MAC   Post-op Pain Management:    Induction: Intravenous  PONV Risk Score and Plan:   Airway Management Planned: Simple Face Mask  Additional Equipment:   Intra-op Plan:   Post-operative Plan:   Informed Consent: I have reviewed the patients History and Physical, chart, labs and discussed the procedure including the risks, benefits and alternatives for the proposed anesthesia with the patient or authorized representative who has indicated his/her understanding and acceptance.     Plan Discussed with:   Anesthesia Plan Comments:         Anesthesia Quick Evaluation

## 2017-03-14 NOTE — Anesthesia Postprocedure Evaluation (Signed)
Anesthesia Post Note  Patient: Brendan Charles  Procedure(s) Performed: Procedure(s) (LRB): COLONOSCOPY WITH PROPOFOL (N/A) POLYPECTOMY  Patient location during evaluation: PACU Anesthesia Type: MAC Level of consciousness: awake and alert and patient cooperative Pain management: pain level controlled Respiratory status: spontaneous breathing Cardiovascular status: stable Postop Assessment: no signs of nausea or vomiting Anesthetic complications: no     Last Vitals:  Vitals:   03/14/17 0731 03/14/17 0735  BP:  132/85  Resp: 19 18    Last Pain: There were no vitals filed for this visit.               Patrecia Veiga A

## 2017-03-14 NOTE — Op Note (Signed)
[facility] Patient Name: Brendan Charles Procedure Date: 03/14/2017 7:37 AM MRN: [id number] Date of Birth: 02/02/1956 Attending MD: Jonette Eva , MD CSN: [id number] Age: 61 Admit Type: Outpatient Procedure:                Colonoscopy WITH COLD SNARE POLYPECTOMY Indications:              Screening for colorectal malignant neoplasm Providers:                Jonette Eva, MD, Nena Polio, RN, Burke Keels,                            Technician Referring MD:             Elfredia Nevins, MD Medicines:                Propofol per Anesthesia Complications:            No immediate complications. Estimated Blood Loss:     Estimated blood loss was minimal. Procedure:                Pre-Anesthesia Assessment:                           - Prior to the procedure, a History and Physical                            was performed, and patient medications and                            allergies were reviewed. The patient's tolerance of                            previous anesthesia was also reviewed. The risks                            and benefits of the procedure and the sedation                            options and risks were discussed with the patient.                            All questions were answered, and informed consent                            was obtained. Prior Anticoagulants: The patient has                            taken no previous anticoagulant or antiplatelet                            agents. ASA Grade Assessment: II - A patient with                            mild systemic disease. After reviewing the risks  and benefits, the patient was deemed in                            satisfactory condition to undergo the procedure.                            After obtaining informed consent, the colonoscope                            was passed under direct vision. Throughout the                            procedure, the patient's blood pressure,  pulse, and                            oxygen saturations were monitored continuously. The                            [id number]) was introduced through the anus                            and advanced to the the cecum, identified by                            appendiceal orifice and ileocecal valve. The                            colonoscopy was performed without difficulty. The                            patient tolerated the procedure well. The quality                            of the bowel preparation was excellent. The                            ileocecal valve, appendiceal orifice, and rectum                            were photographed. Scope In: 7:52:09 AM Scope Out: 8:05:55 AM Scope Withdrawal Time: 0 hours 11 minutes 45 seconds  Total Procedure Duration: 0 hours 13 minutes 46 seconds  Findings:      A 4 mm polyp was found in the rectum. The polyp was sessile. The polyp       was removed with a cold snare. Resection and retrieval were complete.      The recto-sigmoid colon and sigmoid colon were mildly redundant. AND       COLOWRAP      External and internal hemorrhoids were found during retroflexion. The       hemorrhoids were moderate. Impression:               - One 4 mm polyp in the rectum, removed with a cold  snare. Resected and retrieved.                           - Redundant colon.                           - External and internal hemorrhoids. Moderate Sedation:      Per Anesthesia Care Recommendation:           - Repeat colonoscopy in 5-10 years for surveillance.                           - High fiber diet.                           - Continue present medications.                           - Await pathology results.                           - Patient has a contact number available for                            emergencies. The signs and symptoms of potential                            delayed complications were discussed with the                             patient. Return to normal activities tomorrow.                            Written discharge instructions were provided to the                            patient. Procedure Code(s):        --- Professional ---                           [phone], Colonoscopy, flexible; with removal of                            tumor(s), polyp(s), or other lesion(s) by snare                            technique Diagnosis Code(s):        --- Professional ---                           Z12.11, Encounter for screening for malignant                            neoplasm of colon                           K62.1, Rectal polyp  K64.8, Other hemorrhoids                           Q43.8, Other specified congenital malformations of                            intestine CPT copyright 2016 [employer]. All rights reserved. The codes documented in this report are preliminary and upon coder review may  be revised to meet current compliance requirements. Jonette Eva, MD Jonette Eva, MD 03/14/2017 8:12:53 AM This report has been signed electronically. Number of Addenda: 0

## 2017-03-14 NOTE — H&P (Signed)
Primary Care Physician:  Redmond School, MD Primary Gastroenterologist:  Dr. Oneida Alar  Pre-Procedure History & Physical: HPI:  Brendan Charles is a 61 y.o. male here for [place].  Past Medical History:  Diagnosis Date  . Anxiety   . Chronic back pain   . Hepatitis C    HEP C, never treated, 2007    Past Surgical History:  Procedure Laterality Date  . CERVICAL DISC SURGERY  2001   anterior  . LUMBAR SPINE SURGERY  2015    Prior to Admission medications   Medication Sig Start Date End Date Taking? Authorizing Provider  ALPRAZolam Duanne Moron) 0.5 MG tablet Take 0.5 mg by mouth 3 (three) times daily as needed for anxiety.   Yes [provider]  HYDROcodone-acetaminophen (NORCO) 10-325 MG tablet Take 1 tablet by mouth 4 (four) times daily.    Yes [provider]  Na Sulfate-K Sulfate-Mg Sulf (SUPREP BOWEL PREP KIT) 17.5-3.13-1.6 GM/180ML SOLN Take 1 kit by mouth as directed. 02/14/17  Yes Danie Binder, MD    Allergies as of 02/14/2017 - Review Complete 02/14/2017  Allergen Reaction Noted  . Codeine Itching and Nausea Only 05/12/2014    Family History  Problem Relation Age of Onset  . Colon cancer Neg Hx     Social History   Social History  . Marital status: Divorced    Spouse name: N/A  . Number of children: N/A  . Years of education: N/A   Occupational History  . Not on file.   Social History Main Topics  . Smoking status: Current Every Day Smoker    Packs/day: 0.50    Years: 50.00    Types: Cigarettes  . Smokeless tobacco: Never Used  . Alcohol use Yes     Comment: rarely  . Drug use: No  . Sexual activity: Not Currently    Birth control/ protection: None   Other Topics Concern  . Not on file   Social History Narrative  . No narrative on file    Review of Systems: See HPI, otherwise negative ROS   Physical Exam: There were no vitals taken for this visit. General:   Alert,  pleasant and cooperative in NAD Head:   Normocephalic and atraumatic. Neck:  Supple; Lungs:  Clear throughout to auscultation.    Heart:  Regular rate and rhythm. Abdomen:  Soft, nontender and nondistended. Normal bowel sounds, without guarding, and without rebound.   Neurologic:  Alert and  oriented x4;  grossly normal neurologically.  Impression/Plan:     SCREENING  Plan:  1. TCS TODAY. DISCUSSED PROCEDURE, BENEFITS, & RISKS: < 1% chance of medication reaction, bleeding, perforation, or rupture of spleen/liver.

## 2017-03-14 NOTE — Transfer of Care (Signed)
Immediate Anesthesia Transfer of Care Note  Patient: Brendan Charles  Procedure(s) Performed: Procedure(s) with comments: COLONOSCOPY WITH PROPOFOL (N/A) - 8:15am POLYPECTOMY - rectum  Patient Location: PACU  Anesthesia Type:MAC  Level of Consciousness: awake, alert , oriented and patient cooperative  Airway & Oxygen Therapy: Patient Spontanous Breathing and Patient connected to nasal cannula oxygen  Post-op Assessment: Report given to RN and Post -op Vital signs reviewed and stable  Post vital signs: Reviewed and stable  Last Vitals:  Vitals:   03/14/17 0731 03/14/17 0735  BP:  132/85  Resp: 19 18    Last Pain: There were no vitals filed for this visit.       Complications: No apparent anesthesia complications

## 2017-03-14 NOTE — Discharge Instructions (Signed)
You had 1 polyp removed. You have internal  And external hemorrhoids.   DRINK WATER TO KEEP YOUR URINE LIGHT YELLOW.  FOLLOW A HIGH FIBER DIET. AVOID ITEMS THAT CAUSE BLOATING & GAS. SEE INFO BELOW.  YOUR BIOPSY RESULTS WILL BE AVAILABLE IN MY CHART AFTER AUG 10 AND MY OFFICE WILL CONTACT YOU IN 10-14 DAYS WITH YOUR RESULTS.   Next colonoscopy in 5-10 years.    Colonoscopy Care After Read the instructions outlined below and refer to this sheet in the next week. These discharge instructions provide you with general information on caring for yourself after you leave the hospital. While your treatment has been planned according to the most current medical practices available, unavoidable complications occasionally occur. If you have any problems or questions after discharge, call DR. Marlisha Vanwyk, [phone].  ACTIVITY  You may resume your regular activity, but move at a slower pace for the next 24 hours.   Take frequent rest periods for the next 24 hours.   Walking will help get rid of the air and reduce the bloated feeling in your belly (abdomen).   No driving for 24 hours (because of the medicine (anesthesia) used during the test).   You may shower.   Do not sign any important legal documents or operate any machinery for 24 hours (because of the anesthesia used during the test).    NUTRITION  Drink plenty of fluids.   You may resume your normal diet as instructed by your doctor.   Begin with a light meal and progress to your normal diet. Heavy or fried foods are harder to digest and may make you feel sick to your stomach (nauseated).   Avoid alcoholic beverages for 24 hours or as instructed.    MEDICATIONS  You may resume your normal medications.   WHAT YOU CAN EXPECT TODAY  Some feelings of bloating in the abdomen.   Passage of more gas than usual.   Spotting of blood in your stool or on the toilet paper  .  IF YOU HAD POLYPS REMOVED DURING THE COLONOSCOPY:  Eat  a soft diet IF YOU HAVE NAUSEA, BLOATING, ABDOMINAL PAIN, OR VOMITING.    FINDING OUT THE RESULTS OF YOUR TEST Not all test results are available during your visit. DR. Darrick PennaFIELDS WILL CALL YOU WITHIN 14 DAYS OF YOUR PROCEDUE WITH YOUR RESULTS. Do not assume everything is normal if you have not heard from DR. Dowell Hoon, CALL HER OFFICE AT [phone].  SEEK IMMEDIATE MEDICAL ATTENTION AND CALL THE OFFICE: [phone] IF:  You have more than a spotting of blood in your stool.   Your belly is swollen (abdominal distention).   You are nauseated or vomiting.   You have a temperature over 101F.   You have abdominal pain or discomfort that is severe or gets worse throughout the day.   High-Fiber Diet A high-fiber diet changes your normal diet to include more whole grains, legumes, fruits, and vegetables. Changes in the diet involve replacing refined carbohydrates with unrefined foods. The calorie level of the diet is essentially unchanged. The Dietary Reference Intake (recommended amount) for adult males is 38 grams per day. For adult females, it is 25 grams per day. Pregnant and lactating women should consume 28 grams of fiber per day. Fiber is the intact part of a plant that is not broken down during digestion. Functional fiber is fiber that has been isolated from the plant to provide a beneficial effect in the body. PURPOSE  Increase stool bulk.  Ease and regulate bowel movements.   Lower cholesterol.   REDUCE RISK OF COLON CANCER  INDICATIONS THAT YOU NEED MORE FIBER  Constipation and hemorrhoids.   Uncomplicated diverticulosis (intestine condition) and irritable bowel syndrome.   Weight management.   As a protective measure against hardening of the arteries (atherosclerosis), diabetes, and cancer.   GUIDELINES FOR INCREASING FIBER IN THE DIET  Start adding fiber to the diet slowly. A gradual increase of about 5 more grams (2 slices of whole-wheat bread, 2 servings of most  fruits or vegetables, or 1 bowl of high-fiber cereal) per day is best. Too rapid an increase in fiber may result in constipation, flatulence, and bloating.   Drink enough water and fluids to keep your urine clear or pale yellow. Water, juice, or caffeine-free drinks are recommended. Not drinking enough fluid may cause constipation.   Eat a variety of high-fiber foods rather than one type of fiber.   Try to increase your intake of fiber through using high-fiber foods rather than fiber pills or supplements that contain small amounts of fiber.   The goal is to change the types of food eaten. Do not supplement your present diet with high-fiber foods, but replace foods in your present diet.   INCLUDE A VARIETY OF FIBER SOURCES  Replace refined and processed grains with whole grains, canned fruits with fresh fruits, and incorporate other fiber sources. White rice, white breads, and most bakery goods contain little or no fiber.   Brown whole-grain rice, buckwheat oats, and many fruits and vegetables are all good sources of fiber. These include: broccoli, Brussels sprouts, cabbage, cauliflower, beets, sweet potatoes, white potatoes (skin on), carrots, tomatoes, eggplant, squash, berries, fresh fruits, and dried fruits.   Cereals appear to be the richest source of fiber. Cereal fiber is found in whole grains and bran. Bran is the fiber-rich outer coat of cereal grain, which is largely removed in refining. In whole-grain cereals, the bran remains. In breakfast cereals, the largest amount of fiber is found in those with "bran" in their names. The fiber content is sometimes indicated on the label.   You may need to include additional fruits and vegetables each day.   In baking, for 1 cup white flour, you may use the following substitutions:   1 cup whole-wheat flour minus 2 tablespoons.   1/2 cup white flour plus 1/2 cup whole-wheat flour.   Polyps, Colon  A polyp is extra tissue that grows inside  your body. Colon polyps grow in the large intestine. The large intestine, also called the colon, is part of your digestive system. It is a long, hollow tube at the end of your digestive tract where your body makes and stores stool. Most polyps are not dangerous. They are benign. This means they are not cancerous. But over time, some types of polyps can turn into cancer. Polyps that are smaller than a pea are usually not harmful. But larger polyps could someday become or may already be cancerous. To be safe, doctors remove all polyps and test them.   WHO GETS POLYPS? Anyone can get polyps, but certain people are more likely than others. You may have a greater chance of getting polyps if:  You are over 50.   You have had polyps before.   Someone in your family has had polyps.   Someone in your family has had cancer of the large intestine.   Find out if someone in your family has had polyps. You may also be  more likely to get polyps if you:   Eat a lot of fatty foods   Smoke   Drink alcohol   Do not exercise  Eat too much   PREVENTION There is not one sure way to prevent polyps. You might be able to lower your risk of getting them if you:  Eat more fruits and vegetables and less fatty food.   Do not smoke.   Avoid alcohol.   Exercise every day.   Lose weight if you are overweight.   Eating more calcium and folate can also lower your risk of getting polyps. Some foods that are rich in calcium are milk, cheese, and broccoli. Some foods that are rich in folate are chickpeas, kidney beans, and spinach.   Hemorrhoids Hemorrhoids are dilated (enlarged) veins around the rectum. Sometimes clots will form in the veins. This makes them swollen and painful. These are called thrombosed hemorrhoids. Causes of hemorrhoids include:  Constipation.   Straining to have a bowel movement.   HEAVY LIFTING  HOME CARE INSTRUCTIONS  Eat a well balanced diet and drink 6 to 8 glasses of water  every day to avoid constipation. You may also use a bulk laxative.   Avoid straining to have bowel movements.   Keep anal area dry and clean.   Do not use a donut shaped pillow or sit on the toilet for long periods. This increases blood pooling and pain.   Move your bowels when your body has the urge; this will require less straining and will decrease pain and pressure.

## 2017-03-14 NOTE — Anesthesia Procedure Notes (Signed)
Procedure Name: MAC Date/Time: 03/14/2017 7:39 AM Performed by: Andree Elk, AMY A Pre-anesthesia Checklist: Patient identified, Emergency Drugs available, Suction available, Patient being monitored and Timeout performed Oxygen Delivery Method: Simple face mask

## 2017-03-16 ENCOUNTER — Encounter (HOSPITAL_COMMUNITY): Payer: Self-pay | Admitting: Gastroenterology

## 2017-03-17 ENCOUNTER — Telehealth: Payer: Self-pay

## 2017-03-17 NOTE — Telephone Encounter (Signed)
Pt called- he said he had a colonoscopy done on 03/14/17 by [facility] and today he cannot move his entire Right side of his body. He is not in pain, he said it was numb and he could not feel or move that side. I advised the patient to go to the ED to be evaluated. He said he would go.

## 2017-03-17 NOTE — Telephone Encounter (Signed)
Agree with the advice provided 

## 2017-03-18 ENCOUNTER — Emergency Department (HOSPITAL_COMMUNITY): Payer: Medicare Other

## 2017-03-18 ENCOUNTER — Emergency Department (HOSPITAL_COMMUNITY)
Admission: EM | Admit: 2017-03-18 | Discharge: 2017-03-18 | Disposition: A | Discharge status: Home / Self Care | Payer: Medicare Other | Source: Home / Self Care | Attending: Emergency Medicine | Admitting: Emergency Medicine

## 2017-03-18 ENCOUNTER — Encounter (HOSPITAL_COMMUNITY): Payer: Self-pay | Admitting: Emergency Medicine

## 2017-03-18 DIAGNOSIS — R918 Other nonspecific abnormal finding of lung field: Secondary | ICD-10-CM | POA: Diagnosis not present

## 2017-03-18 DIAGNOSIS — Z79899 Other long term (current) drug therapy: Secondary | ICD-10-CM

## 2017-03-18 DIAGNOSIS — R402252 Coma scale, best verbal response, oriented, at arrival to emergency department: Secondary | ICD-10-CM | POA: Diagnosis not present

## 2017-03-18 DIAGNOSIS — R29810 Facial weakness: Secondary | ICD-10-CM | POA: Diagnosis not present

## 2017-03-18 DIAGNOSIS — N1330 Unspecified hydronephrosis: Secondary | ICD-10-CM | POA: Diagnosis not present

## 2017-03-18 DIAGNOSIS — F17210 Nicotine dependence, cigarettes, uncomplicated: Secondary | ICD-10-CM | POA: Insufficient documentation

## 2017-03-18 DIAGNOSIS — I63512 Cerebral infarction due to unspecified occlusion or stenosis of left middle cerebral artery: Secondary | ICD-10-CM | POA: Diagnosis not present

## 2017-03-18 DIAGNOSIS — I719 Aortic aneurysm of unspecified site, without rupture: Secondary | ICD-10-CM | POA: Diagnosis not present

## 2017-03-18 DIAGNOSIS — I671 Cerebral aneurysm, nonruptured: Secondary | ICD-10-CM | POA: Diagnosis not present

## 2017-03-18 DIAGNOSIS — R402362 Coma scale, best motor response, obeys commands, at arrival to emergency department: Secondary | ICD-10-CM | POA: Diagnosis not present

## 2017-03-18 DIAGNOSIS — R4701 Aphasia: Secondary | ICD-10-CM | POA: Diagnosis not present

## 2017-03-18 DIAGNOSIS — M549 Dorsalgia, unspecified: Secondary | ICD-10-CM | POA: Diagnosis not present

## 2017-03-18 DIAGNOSIS — R29704 NIHSS score 4: Secondary | ICD-10-CM | POA: Diagnosis not present

## 2017-03-18 DIAGNOSIS — Z981 Arthrodesis status: Secondary | ICD-10-CM | POA: Diagnosis not present

## 2017-03-18 DIAGNOSIS — Z9181 History of falling: Secondary | ICD-10-CM | POA: Diagnosis not present

## 2017-03-18 DIAGNOSIS — N132 Hydronephrosis with renal and ureteral calculous obstruction: Secondary | ICD-10-CM | POA: Diagnosis not present

## 2017-03-18 DIAGNOSIS — R911 Solitary pulmonary nodule: Secondary | ICD-10-CM | POA: Diagnosis not present

## 2017-03-18 DIAGNOSIS — M6281 Muscle weakness (generalized): Secondary | ICD-10-CM | POA: Diagnosis not present

## 2017-03-18 DIAGNOSIS — R296 Repeated falls: Secondary | ICD-10-CM | POA: Diagnosis not present

## 2017-03-18 DIAGNOSIS — G8929 Other chronic pain: Secondary | ICD-10-CM | POA: Diagnosis not present

## 2017-03-18 DIAGNOSIS — K769 Liver disease, unspecified: Secondary | ICD-10-CM | POA: Diagnosis not present

## 2017-03-18 DIAGNOSIS — N200 Calculus of kidney: Secondary | ICD-10-CM | POA: Insufficient documentation

## 2017-03-18 DIAGNOSIS — R8271 Bacteriuria: Secondary | ICD-10-CM | POA: Diagnosis not present

## 2017-03-18 DIAGNOSIS — R531 Weakness: Secondary | ICD-10-CM | POA: Diagnosis not present

## 2017-03-18 DIAGNOSIS — I700 Atherosclerosis of aorta: Secondary | ICD-10-CM | POA: Diagnosis not present

## 2017-03-18 DIAGNOSIS — G8191 Hemiplegia, unspecified affecting right dominant side: Secondary | ICD-10-CM | POA: Diagnosis not present

## 2017-03-18 DIAGNOSIS — K7689 Other specified diseases of liver: Secondary | ICD-10-CM

## 2017-03-18 DIAGNOSIS — Z885 Allergy status to narcotic agent status: Secondary | ICD-10-CM | POA: Diagnosis not present

## 2017-03-18 DIAGNOSIS — R402142 Coma scale, eyes open, spontaneous, at arrival to emergency department: Secondary | ICD-10-CM | POA: Diagnosis not present

## 2017-03-18 LAB — URINALYSIS, ROUTINE W REFLEX MICROSCOPIC
BILIRUBIN URINE: NEGATIVE
Glucose, UA: NEGATIVE mg/dL
KETONES UR: 20 mg/dL — AB
Leukocytes, UA: NEGATIVE
Nitrite: NEGATIVE
Protein, ur: 100 mg/dL — AB
Specific Gravity, Urine: >1.046 — ABNORMAL HIGH (ref 1.005–1.030)
pH: 5 (ref 5.0–8.0)

## 2017-03-18 LAB — COMPREHENSIVE METABOLIC PANEL
ALT: 30 U/L (ref 17–63)
ANION GAP: 13 (ref 5–15)
AST: 25 U/L (ref 15–41)
Albumin: 4.4 g/dL (ref 3.5–5.0)
Alkaline Phosphatase: 88 U/L (ref 38–126)
BUN: 21 mg/dL — ABNORMAL HIGH (ref 6–20)
CHLORIDE: 105 mmol/L (ref 101–111)
CO2: 24 mmol/L (ref 22–32)
CREATININE: 1.23 mg/dL (ref 0.61–1.24)
Calcium: 9.4 mg/dL (ref 8.9–10.3)
GFR calc non Af Amer: >60 mL/min (ref >60)
Glucose, Bld: 131 mg/dL — ABNORMAL HIGH (ref 65–99)
Potassium: 3.3 mmol/L — ABNORMAL LOW (ref 3.5–5.1)
SODIUM: 142 mmol/L (ref 135–145)
Total Bilirubin: 0.8 mg/dL (ref 0.3–1.2)
Total Protein: 7.5 g/dL (ref 6.5–8.1)

## 2017-03-18 LAB — CBC
HCT: 45.2 % (ref 39.0–52.0)
HEMOGLOBIN: 15.6 g/dL (ref 13.0–17.0)
MCH: 31 pg (ref 26.0–34.0)
MCHC: 34.5 g/dL (ref 30.0–36.0)
MCV: 89.9 fL (ref 78.0–100.0)
Platelets: 371 10*3/uL (ref 150–400)
RBC: 5.03 MIL/uL (ref 4.22–5.81)
RDW: 13.1 % (ref 11.5–15.5)
WBC: 19.8 10*3/uL — ABNORMAL HIGH (ref 4.0–10.5)

## 2017-03-18 LAB — LIPASE, BLOOD: LIPASE: 20 U/L (ref 11–51)

## 2017-03-18 MED ORDER — ONDANSETRON 8 MG PO TBDP: 8.0000 mg | ORAL_TABLET | Freq: Three times a day (TID) | ORAL | 0 refills | Status: DC | PRN

## 2017-03-18 MED ORDER — HYDROMORPHONE HCL 1 MG/ML IJ SOLN
1.0000 mg | Freq: Once | INTRAMUSCULAR | Status: AC
  Administered 2017-03-18: 1 mg via INTRAVENOUS
  Filled 2017-03-18: qty 1

## 2017-03-18 MED ORDER — ONDANSETRON HCL 4 MG/2ML IJ SOLN
4.0000 mg | Freq: Once | INTRAMUSCULAR | Status: AC
Start: 2017-03-18 — End: 2017-03-18
  Administered 2017-03-18: 4 mg via INTRAVENOUS
  Filled 2017-03-18: qty 2

## 2017-03-18 MED ORDER — KETOROLAC TROMETHAMINE 30 MG/ML IJ SOLN
30.0000 mg | Freq: Once | INTRAMUSCULAR | Status: AC
  Administered 2017-03-18: 30 mg via INTRAVENOUS
  Filled 2017-03-18: qty 1

## 2017-03-18 MED ORDER — IOPAMIDOL (ISOVUE-300) INJECTION 61%
100.0000 mL | Freq: Once | INTRAVENOUS | Status: AC | PRN
  Administered 2017-03-18: 100 mL via INTRAVENOUS

## 2017-03-18 MED ORDER — IOPAMIDOL (ISOVUE-370) INJECTION 76%: 100.0000 mL | Freq: Once | INTRAVENOUS | Status: DC | PRN

## 2017-03-18 MED ORDER — OXYCODONE-ACETAMINOPHEN 7.5-325 MG PO TABS: 1.0000 | ORAL_TABLET | Freq: Four times a day (QID) | ORAL | 0 refills | Status: DC | PRN

## 2017-03-18 MED ORDER — TAMSULOSIN HCL 0.4 MG PO CAPS: 0.4000 mg | ORAL_CAPSULE | Freq: Every day | ORAL | 0 refills | Status: DC

## 2017-03-18 NOTE — ED Notes (Signed)
Patient stated he is unable to void at this time

## 2017-03-18 NOTE — ED Triage Notes (Addendum)
Pt reports had a colonoscopy on Tuesday and reports LLQ pain and emesis ever since. Pt reports routine colonoscopy. Pt reports LBM prior to Tuesday.   Pt reports multiple falls since Wednesday and reports "its hard to talk" ever since colonoscopy. nad noted.

## 2017-03-18 NOTE — ED Provider Notes (Signed)
[facility] Provider Note   CSN: [id number] Arrival date & time: 03/18/17  1108     History   Chief Complaint Chief Complaint  Patient presents with  . Abdominal Pain    HPI Brendan Charles is a 61 y.o. male.  HPI   Brendan Charles is a 61 y.o. male who presents to the Emergency Department complaining of left sided abdominal pain, nausea and intermittent vomiting.  He had a routine colonoscopy on 03/14/17 by Dr. Darrick Penna, he states one polyp was removed.  He developed sharp pain to the left lower abdomen that evening that has been persistent.  Vomiting intermittently, worse this morning. Unable to tolerate foods, minimal liquids.  He denies fever, diarrhea, bloody or black stools, and hematemesis.  He states he called his GI doctor's office yesterday and was advised to come here for evaluation yesterday, but when asked he states "well it got worse today."  Past Medical History:  Diagnosis Date  . Anxiety   . Chronic back pain   . Hepatitis C    HEP C, never treated, 2007    Patient Active Problem List   Diagnosis Date Noted  . Hepatitis C 02/14/2017  . Encounter for screening colonoscopy 02/14/2017  . Spondylolisthesis at L3-L4 level 06/03/2014    Past Surgical History:  Procedure Laterality Date  . CERVICAL DISC SURGERY  2001   anterior  . COLONOSCOPY WITH PROPOFOL N/A 03/14/2017   Procedure: COLONOSCOPY WITH PROPOFOL;  Surgeon: West Bali, MD;  Location: [facility];  Service: Endoscopy;  Laterality: N/A;  8:15am  . LUMBAR SPINE SURGERY  2015  . POLYPECTOMY  03/14/2017   Procedure: POLYPECTOMY;  Surgeon: West Bali, MD;  Location: [facility];  Service: Endoscopy;;  rectum       Home Medications    Prior to Admission medications   Medication Sig Start Date End Date Taking? Authorizing Provider  ALPRAZolam Prudy Feeler) 0.5 MG tablet Take 0.5 mg by mouth 3 (three) times daily as needed for anxiety.    [provider]    HYDROcodone-acetaminophen (NORCO) 10-325 MG tablet Take 1 tablet by mouth 4 (four) times daily.     [provider]    Family History Family History  Problem Relation Age of Onset  . Colon cancer Neg Hx     Social History Social History  Substance Use Topics  . Smoking status: Current Every Day Smoker    Packs/day: 0.50    Years: 50.00    Types: Cigarettes  . Smokeless tobacco: Never Used  . Alcohol use Yes     Comment: rarely     Allergies   Codeine   Review of Systems Review of Systems  Constitutional: Negative for appetite change, chills and fever.  Respiratory: Negative for chest tightness and shortness of breath.   Cardiovascular: Negative for chest pain.  Gastrointestinal: Positive for abdominal pain, nausea and vomiting. Negative for blood in stool and diarrhea.  Genitourinary: Negative for decreased urine volume, dysuria and flank pain.  Musculoskeletal: Negative for back pain.  Skin: Negative for color change and rash.  Neurological: Negative for dizziness, weakness and numbness.  Hematological: Negative for adenopathy.  All other systems reviewed and are negative.    Physical Exam Updated Vital Signs BP (!) 190/93 (BP Location: Right Arm)   Pulse 84   Temp 97.6 F (36.4 C) (Oral)   Resp 18   Ht 5\' 9"  (1.753 m)   Wt 71.7 kg (158 lb)  SpO2 99%   BMI 23.33 kg/m   Physical Exam  Constitutional: He is oriented to person, place, and time. He appears well-developed and well-nourished. He appears distressed.  Patient appears uncomfortable  HENT:  Head: Normocephalic and atraumatic.  Mouth/Throat: Oropharynx is clear and moist.  Cardiovascular: Normal rate, regular rhythm, normal heart sounds and intact distal pulses.   No murmur heard. Pulmonary/Chest: Effort normal and breath sounds normal. No respiratory distress.  Abdominal: Soft. Bowel sounds are normal. He exhibits no distension and no mass. There is tenderness. There is guarding.  There is no rebound.  Patient grimacing and holding his left lower abdomen. Tender to palp.  Abdomen is soft, bowel sounds present x 4.  No rebound tenderness.   Musculoskeletal: Normal range of motion. He exhibits no edema.  Neurological: He is alert and oriented to person, place, and time. He exhibits normal muscle tone. Coordination normal.  Skin: Skin is warm and dry.  Nursing note and vitals reviewed.    ED Treatments / Results  Labs (all labs ordered are listed, but only abnormal results are displayed) Labs Reviewed  COMPREHENSIVE METABOLIC PANEL - Abnormal; Notable for the following:       Result Value   Potassium 3.3 (*)    Glucose, Bld 131 (*)    BUN 21 (*)    All other components within normal limits  CBC - Abnormal; Notable for the following:    WBC 19.8 (*)    All other components within normal limits  URINALYSIS, ROUTINE W REFLEX MICROSCOPIC - Abnormal; Notable for the following:    APPearance HAZY (*)    Specific Gravity, Urine >1.046 (*)    Hgb urine dipstick LARGE (*)    Ketones, ur 20 (*)    Protein, ur 100 (*)    Bacteria, UA RARE (*)    Squamous Epithelial / LPF 0-5 (*)    All other components within normal limits  LIPASE, BLOOD    EKG  EKG Interpretation None       Radiology Ct Abdomen Pelvis W Contrast  Result Date: 03/18/2017 CLINICAL DATA:  LEFT lower quadrant pain and emesis since colonoscopy on Tuesday, multiple falls since Wednesday EXAM: CT ABDOMEN AND PELVIS WITH CONTRAST TECHNIQUE: Multidetector CT imaging of the abdomen and pelvis was performed using the standard protocol following bolus administration of intravenous contrast. Sagittal and coronal MPR images reconstructed from axial data set. CONTRAST:  ISOVUE-300 IOPAMIDOL (ISOVUE-300) INJECTION 61% IV. No oral contrast. COMPARISON:  None. FINDINGS: Lower chest: Dependent atelectasis at lung bases. Question atelectasis versus 8 mm nodule at medial RIGHT middle lobe image 3.  Hepatobiliary: 7 mm nonspecific intermediate attenuation focus superiorly at dome of liver image 8. No additional focal abnormalities of the liver or gallbladder. Pancreas: Normal appearance Spleen: Normal appearance Adrenals/Urinary Tract: Adrenal glands, RIGHT kidney, RIGHT ureter, and bladder normal appearance. LEFT hydronephrosis with delay in LEFT nephrogram, LEFT perinephric edema, and proximal LEFT ureteral dilatation secondary to a 7 x 8 x 13 mm proximal LEFT ureteral calculus. Distal LEFT ureter decompressed. Stomach/Bowel: Normal appearance Vascular/Lymphatic: Atherosclerotic calcification aorta and iliac arteries without aneurysm. No adenopathy. Reproductive: Mild prostatic enlargement, gland 5.4 x 3.7 cm image 81. Seminal vesicles unremarkable. Other: No free air free fluid.  No hernia. Musculoskeletal: Prior lumbar fusion L3-L4 with failure of incorporation of the disc prosthesis and associated irregular vertebral endplates. Bones demineralized. IMPRESSION: LEFT hydronephrosis and proximal LEFT hydroureter with associated impairment of LEFT nephrogram secondary to an obstructing 7 x 8  x 13 mm proximal LEFT ureteral calculus. Nonspecific 7 mm intermediate attenuation lesion within the liver ; recommend follow-up MR imaging in 6 months to assess stability and characterize. Prostatic enlargement. Question 8 mm RIGHT middle lobe nodule, recommendation below. Non-contrast chest CT at 6-12 months is recommended. If the nodule is stable at time of repeat CT, then future CT at 18-24 months (from today's scan) is considered optional for low-risk patients, but is recommended for high-risk patients. This recommendation follows the consensus statement: Guidelines for Management of Incidental Pulmonary Nodules Detected on CT Images: From the Fleischner Society 2017; Radiology 2017; 284:228-243. Aortic Atherosclerosis (ICD10-I70.0). Electronically Signed   By: Ulyses SouthwardMark  Boles M.D.   On: 03/18/2017 13:48     Procedures Procedures (including critical care time)  Medications Ordered in ED Medications - No data to display   Initial Impression / Assessment and Plan / ED Course  I have reviewed the triage vital signs and the nursing notes.  Pertinent labs & imaging results that were available during my care of the patient were reviewed by me and considered in my medical decision making (see chart for details).     1620  Pt is feeling better.  Pain significantly improved after Toradol.  Vitals remain stable.  No vomiting during ED stay.  Discussed CT findings including liver lesion and lung nodule.  Pt verbalized understadning of importance of f/u for further evaluation of these incidental findings..  Also discussed kidney stone and need for close urology f/u as he will not likely be able to pass a stone of this size.  states that he is ready for d/c, advised to return for worsening pain, fever and vomiting.      Final Clinical Impressions(s) / ED Diagnoses   Final diagnoses:  Kidney stone on left side  Lung nodule  Liver lesion    New Prescriptions New Prescriptions   No medications on file     Rosey Bathriplett, Denita Lun, PA-C 03/18/17 2241    Blane OharaZavitz, Joshua, MD 03/19/17 [phone]

## 2017-03-18 NOTE — ED Notes (Signed)
Pt son became belligerent and cussing at this nurse due to pts pain. Instructed pt that edp was on their way. At that point family states "that bitch better get her fucking ass down here. Called security and pt walked out continuing to cuss

## 2017-03-18 NOTE — ED Notes (Signed)
Patient transported to CT 

## 2017-03-18 NOTE — Discharge Instructions (Signed)
Call the urologist listed on Monday to arrange a follow-up appt.  Also as we discussed, you have a spot on your liver and a nodule in the right lung that need close follow-up and repeat CT scan.  Return here for any worsening symptoms

## 2017-03-20 ENCOUNTER — Emergency Department (HOSPITAL_COMMUNITY): Payer: Medicare Other

## 2017-03-20 ENCOUNTER — Telehealth: Payer: Self-pay | Admitting: Gastroenterology

## 2017-03-20 ENCOUNTER — Inpatient Hospital Stay (HOSPITAL_COMMUNITY)
Admission: EM | Admit: 2017-03-20 | Discharge: 2017-03-21 | DRG: 065 | Disposition: A | Discharge status: Home Health | Payer: Medicare Other | Attending: Family Medicine | Admitting: Family Medicine

## 2017-03-20 ENCOUNTER — Encounter (HOSPITAL_COMMUNITY): Payer: Self-pay

## 2017-03-20 ENCOUNTER — Inpatient Hospital Stay (HOSPITAL_COMMUNITY): Payer: Medicare Other

## 2017-03-20 DIAGNOSIS — Z885 Allergy status to narcotic agent status: Secondary | ICD-10-CM

## 2017-03-20 DIAGNOSIS — Z981 Arthrodesis status: Secondary | ICD-10-CM

## 2017-03-20 DIAGNOSIS — N1330 Unspecified hydronephrosis: Secondary | ICD-10-CM | POA: Diagnosis not present

## 2017-03-20 DIAGNOSIS — I719 Aortic aneurysm of unspecified site, without rupture: Secondary | ICD-10-CM | POA: Diagnosis present

## 2017-03-20 DIAGNOSIS — M549 Dorsalgia, unspecified: Secondary | ICD-10-CM | POA: Diagnosis present

## 2017-03-20 DIAGNOSIS — R402142 Coma scale, eyes open, spontaneous, at arrival to emergency department: Secondary | ICD-10-CM | POA: Diagnosis present

## 2017-03-20 DIAGNOSIS — R29810 Facial weakness: Secondary | ICD-10-CM | POA: Diagnosis present

## 2017-03-20 DIAGNOSIS — F419 Anxiety disorder, unspecified: Secondary | ICD-10-CM | POA: Diagnosis present

## 2017-03-20 DIAGNOSIS — R4701 Aphasia: Secondary | ICD-10-CM | POA: Diagnosis present

## 2017-03-20 DIAGNOSIS — R8271 Bacteriuria: Secondary | ICD-10-CM | POA: Diagnosis present

## 2017-03-20 DIAGNOSIS — Z9181 History of falling: Secondary | ICD-10-CM

## 2017-03-20 DIAGNOSIS — R269 Unspecified abnormalities of gait and mobility: Secondary | ICD-10-CM | POA: Diagnosis not present

## 2017-03-20 DIAGNOSIS — G8929 Other chronic pain: Secondary | ICD-10-CM | POA: Diagnosis present

## 2017-03-20 DIAGNOSIS — R402362 Coma scale, best motor response, obeys commands, at arrival to emergency department: Secondary | ICD-10-CM | POA: Diagnosis present

## 2017-03-20 DIAGNOSIS — R296 Repeated falls: Secondary | ICD-10-CM | POA: Diagnosis present

## 2017-03-20 DIAGNOSIS — R479 Unspecified speech disturbances: Secondary | ICD-10-CM | POA: Diagnosis not present

## 2017-03-20 DIAGNOSIS — I639 Cerebral infarction, unspecified: Secondary | ICD-10-CM | POA: Diagnosis not present

## 2017-03-20 DIAGNOSIS — I63233 Cerebral infarction due to unspecified occlusion or stenosis of bilateral carotid arteries: Secondary | ICD-10-CM | POA: Diagnosis not present

## 2017-03-20 DIAGNOSIS — I671 Cerebral aneurysm, nonruptured: Secondary | ICD-10-CM | POA: Diagnosis present

## 2017-03-20 DIAGNOSIS — K769 Liver disease, unspecified: Secondary | ICD-10-CM | POA: Diagnosis present

## 2017-03-20 DIAGNOSIS — R531 Weakness: Secondary | ICD-10-CM

## 2017-03-20 DIAGNOSIS — F17210 Nicotine dependence, cigarettes, uncomplicated: Secondary | ICD-10-CM | POA: Diagnosis present

## 2017-03-20 DIAGNOSIS — I6789 Other cerebrovascular disease: Secondary | ICD-10-CM

## 2017-03-20 DIAGNOSIS — R918 Other nonspecific abnormal finding of lung field: Secondary | ICD-10-CM | POA: Diagnosis present

## 2017-03-20 DIAGNOSIS — Z6824 Body mass index (BMI) 24.0-24.9, adult: Secondary | ICD-10-CM | POA: Diagnosis not present

## 2017-03-20 DIAGNOSIS — I700 Atherosclerosis of aorta: Secondary | ICD-10-CM | POA: Diagnosis not present

## 2017-03-20 DIAGNOSIS — G8191 Hemiplegia, unspecified affecting right dominant side: Secondary | ICD-10-CM | POA: Diagnosis not present

## 2017-03-20 DIAGNOSIS — R29704 NIHSS score 4: Secondary | ICD-10-CM | POA: Diagnosis present

## 2017-03-20 DIAGNOSIS — N132 Hydronephrosis with renal and ureteral calculous obstruction: Secondary | ICD-10-CM | POA: Diagnosis present

## 2017-03-20 DIAGNOSIS — B1920 Unspecified viral hepatitis C without hepatic coma: Secondary | ICD-10-CM | POA: Diagnosis present

## 2017-03-20 DIAGNOSIS — M6281 Muscle weakness (generalized): Secondary | ICD-10-CM | POA: Diagnosis not present

## 2017-03-20 DIAGNOSIS — I63512 Cerebral infarction due to unspecified occlusion or stenosis of left middle cerebral artery: Principal | ICD-10-CM | POA: Diagnosis present

## 2017-03-20 DIAGNOSIS — Z86010 Personal history of colonic polyps: Secondary | ICD-10-CM

## 2017-03-20 DIAGNOSIS — E782 Mixed hyperlipidemia: Secondary | ICD-10-CM | POA: Diagnosis not present

## 2017-03-20 DIAGNOSIS — Z8673 Personal history of transient ischemic attack (TIA), and cerebral infarction without residual deficits: Secondary | ICD-10-CM

## 2017-03-20 DIAGNOSIS — R402252 Coma scale, best verbal response, oriented, at arrival to emergency department: Secondary | ICD-10-CM | POA: Diagnosis present

## 2017-03-20 DIAGNOSIS — G8101 Flaccid hemiplegia affecting right dominant side: Secondary | ICD-10-CM | POA: Diagnosis not present

## 2017-03-20 DIAGNOSIS — Z1389 Encounter for screening for other disorder: Secondary | ICD-10-CM | POA: Diagnosis not present

## 2017-03-20 DIAGNOSIS — Z809 Family history of malignant neoplasm, unspecified: Secondary | ICD-10-CM

## 2017-03-20 HISTORY — DX: Cerebral infarction, unspecified: I63.9

## 2017-03-20 LAB — COMPREHENSIVE METABOLIC PANEL
ALT: 24 U/L (ref 17–63)
ANION GAP: 10 (ref 5–15)
AST: 21 U/L (ref 15–41)
Albumin: 4.4 g/dL (ref 3.5–5.0)
Alkaline Phosphatase: 79 U/L (ref 38–126)
BUN: 24 mg/dL — ABNORMAL HIGH (ref 6–20)
CHLORIDE: 103 mmol/L (ref 101–111)
CO2: 27 mmol/L (ref 22–32)
CREATININE: 1.08 mg/dL (ref 0.61–1.24)
Calcium: 9.4 mg/dL (ref 8.9–10.3)
Glucose, Bld: 101 mg/dL — ABNORMAL HIGH (ref 65–99)
POTASSIUM: 4 mmol/L (ref 3.5–5.1)
SODIUM: 140 mmol/L (ref 135–145)
Total Bilirubin: 0.9 mg/dL (ref 0.3–1.2)
Total Protein: 7.7 g/dL (ref 6.5–8.1)

## 2017-03-20 LAB — RAPID URINE DRUG SCREEN, HOSP PERFORMED
AMPHETAMINES: NOT DETECTED
BENZODIAZEPINES: NOT DETECTED
Barbiturates: NOT DETECTED
COCAINE: NOT DETECTED
OPIATES: POSITIVE — AB
Tetrahydrocannabinol: NOT DETECTED

## 2017-03-20 LAB — URINALYSIS, ROUTINE W REFLEX MICROSCOPIC
BACTERIA UA: NONE SEEN
Bilirubin Urine: NEGATIVE
GLUCOSE, UA: NEGATIVE mg/dL
KETONES UR: 20 mg/dL — AB
Nitrite: NEGATIVE
PROTEIN: 30 mg/dL — AB
Specific Gravity, Urine: 1.024 (ref 1.005–1.030)
pH: 5 (ref 5.0–8.0)

## 2017-03-20 LAB — I-STAT TROPONIN, ED: TROPONIN I, POC: 0 ng/mL (ref 0.00–0.08)

## 2017-03-20 LAB — DIFFERENTIAL
BASOS PCT: 0 %
Basophils Absolute: 0 10*3/uL (ref 0.0–0.1)
EOS ABS: 0.1 10*3/uL (ref 0.0–0.7)
Eosinophils Relative: 0 %
Lymphocytes Relative: 20 %
Lymphs Abs: 2.5 10*3/uL (ref 0.7–4.0)
MONO ABS: 1 10*3/uL (ref 0.1–1.0)
MONOS PCT: 8 %
NEUTROS ABS: 9 10*3/uL — AB (ref 1.7–7.7)
Neutrophils Relative %: 72 %

## 2017-03-20 LAB — CBC
HCT: 45.8 % (ref 39.0–52.0)
HEMOGLOBIN: 15.2 g/dL (ref 13.0–17.0)
MCH: 30.5 pg (ref 26.0–34.0)
MCHC: 33.2 g/dL (ref 30.0–36.0)
MCV: 92 fL (ref 78.0–100.0)
Platelets: 297 10*3/uL (ref 150–400)
RBC: 4.98 MIL/uL (ref 4.22–5.81)
RDW: 13.5 % (ref 11.5–15.5)
WBC: 12.5 10*3/uL — ABNORMAL HIGH (ref 4.0–10.5)

## 2017-03-20 LAB — ETHANOL: Alcohol, Ethyl (B): <5 mg/dL (ref <5)

## 2017-03-20 LAB — PROTIME-INR
INR: 1.03
Prothrombin Time: 13.5 seconds (ref 11.4–15.2)

## 2017-03-20 LAB — ECHOCARDIOGRAM COMPLETE
Height: 69 in
Weight: 2352.75 oz

## 2017-03-20 LAB — APTT: APTT: 25 s (ref 24–36)

## 2017-03-20 MED ORDER — ALPRAZOLAM 0.5 MG PO TABS: 0.5000 mg | ORAL_TABLET | Freq: Three times a day (TID) | ORAL | Status: DC | PRN

## 2017-03-20 MED ORDER — ATORVASTATIN CALCIUM 20 MG PO TABS
20.0000 mg | ORAL_TABLET | Freq: Every day | ORAL | Status: DC
  Administered 2017-03-20: 20 mg via ORAL
  Filled 2017-03-20: qty 1

## 2017-03-20 MED ORDER — ACETAMINOPHEN 650 MG RE SUPP: 650.0000 mg | Freq: Four times a day (QID) | RECTAL | Status: DC | PRN

## 2017-03-20 MED ORDER — ENOXAPARIN SODIUM 40 MG/0.4ML ~~LOC~~ SOLN
40.0000 mg | SUBCUTANEOUS | Status: DC
  Administered 2017-03-20: 40 mg via SUBCUTANEOUS
  Filled 2017-03-20: qty 0.4

## 2017-03-20 MED ORDER — SODIUM CHLORIDE 0.9% FLUSH
3.0000 mL | INTRAVENOUS | Status: DC | PRN
  Administered 2017-03-20: 3 mL via INTRAVENOUS
  Filled 2017-03-20: qty 3

## 2017-03-20 MED ORDER — SODIUM CHLORIDE 0.9% FLUSH
3.0000 mL | Freq: Two times a day (BID) | INTRAVENOUS | Status: DC
  Administered 2017-03-21: 3 mL via INTRAVENOUS

## 2017-03-20 MED ORDER — ASPIRIN 325 MG PO TABS
325.0000 mg | ORAL_TABLET | Freq: Every day | ORAL | Status: DC
  Administered 2017-03-21: 325 mg via ORAL
  Filled 2017-03-20: qty 1

## 2017-03-20 MED ORDER — HYDROCODONE-ACETAMINOPHEN 10-325 MG PO TABS
1.0000 | ORAL_TABLET | Freq: Four times a day (QID) | ORAL | Status: DC
  Administered 2017-03-20 – 2017-03-21 (×4): 1 via ORAL
  Filled 2017-03-20 (×4): qty 1

## 2017-03-20 MED ORDER — ACETAMINOPHEN 325 MG PO TABS: 650.0000 mg | ORAL_TABLET | Freq: Four times a day (QID) | ORAL | Status: DC | PRN

## 2017-03-20 MED ORDER — TAMSULOSIN HCL 0.4 MG PO CAPS
0.4000 mg | ORAL_CAPSULE | Freq: Every day | ORAL | Status: DC
  Administered 2017-03-21: 0.4 mg via ORAL
  Filled 2017-03-20: qty 1

## 2017-03-20 MED ORDER — ASPIRIN 300 MG RE SUPP: 300.0000 mg | Freq: Every day | RECTAL | Status: DC

## 2017-03-20 MED ORDER — CLOPIDOGREL BISULFATE 75 MG PO TABS
75.0000 mg | ORAL_TABLET | Freq: Every day | ORAL | Status: DC
  Administered 2017-03-21: 75 mg via ORAL
  Filled 2017-03-20: qty 1

## 2017-03-20 MED ORDER — STROKE: EARLY STAGES OF RECOVERY BOOK
Freq: Once | Status: DC
  Filled 2017-03-20: qty 1

## 2017-03-20 MED ORDER — SODIUM CHLORIDE 0.9 % IV SOLN: 250.0000 mL | INTRAVENOUS | Status: DC | PRN

## 2017-03-20 MED ORDER — GADOBENATE DIMEGLUMINE 529 MG/ML IV SOLN
15.0000 mL | Freq: Once | INTRAVENOUS | Status: AC | PRN
  Administered 2017-03-20: 15 mL via INTRAVENOUS

## 2017-03-20 NOTE — Consult Note (Signed)
[place] A. Merlene Laughter, MD     [email or web address]          Brendan Charles is an 61 y.o. male.   ASSESSMENT/PLAN: Acute left MCA infarct due to large vessel intracranial arterial occlusive disease: Risk factors age and cigarette smoking - I recommended patient be placed on 325 mg aspirin Plavix combination for 3-6 months. Subsequently, he should be on single agent preferably aspirin. Statin is also recommended. Additional labs are recommended including HIV, homocysteine level and hemoglobin A1c and lipid panel. Speech, occupational and physical therapy are recommended. Smoking cessation discussed with the patient.       The patient is a 61 year old right-handed white male who underwent colonoscopy about a week ago. The procedure went well by the patient reports a few days later developing right-sided weakness. He did not seek medical attention for unknown reasons. The patient did call his providers a couple days later and was told to go to the emergency room but the patient thought that he would get better and did not see medical attention. The following day he developed left lower quadrant pain and was seen in the emergency room. Surprisingly, he he did not alert the providers that he had right-sided weakness. Concurrently. He also has had significant difficulties with his speech. The patient does smoke cigarettes about half a pack per day. There is no family history of early stroke or coronary disease or heart failure. He tells me that his blood pressure has been well controlled. He has been able to ambulate despite his right-sided weakness. He also tells me he has not had a lot of difficulties with swallowing. The review systems otherwise negative.   GENERAL: This a pleasant average weight management in no acute distress.  HEENT: Normal  ABDOMEN: soft  EXTREMITIES: No edema   BACK: Normal  SKIN: Normal by inspection.    MENTAL STATUS: Alert and oriented. Speech  is moderately dysarthric: There appears to be reduced fluency although mild. There also appears to be mild difficulty with word finding. He was able to name 5/5 objects at the bedside however. Comprehension is good. Repetition is also good.  CRANIAL NERVES: Pupils are equal, round and reactive to light and accomodation; extra ocular movements are full, there is no significant nystagmus; visual fields are full; upper and lower facial muscles are normal in strength and symmetric, there is subtle  flattening of the nasolabial fold - R; tongue is midline; uvula is midline; shoulder elevation is normal. Visual fields are full and no extinction appreciated.  MOTOR:  There is right upper extremity weakness with deltoid graded as 3/5, biceps and triceps 5 and hand grip 4. There is significantly. Hand dexterity however and fine finger movements on the right side. There is a significant drift right upper extremity. Right hip flexion 4+/5. Dorsiflexion 5 with a mild drift. The left side shows normal tone, bulk and strength. No drift on the left side. Occasional fasciculations noted of the right upper extremity particularly the right biceps and deltoid.  COORDINATION: Left finger to nose is normal, right finger to nose is normal, No rest tremor; no intention tremor; no postural tremor; no bradykinesia.  REFLEXES: Deep tendon reflexes are symmetrical and normal. Babinski reflexes are flexor bilaterally.   SENSATION: Normal to light touch. No extinction appreciated.    NIH stroke scale 6.  Blood pressure 130/78, pulse (!) 109, temperature 97.8 F (36.6 C), temperature source Oral, resp. rate 20, height _0  (1.753 m), weight 147 lb  0.8 oz (66.7 kg), SpO2 99 %.  Past Medical History:  Diagnosis Date  . Anxiety   . Chronic back pain   . CVA (cerebral vascular accident) ([place]) 03/20/2017  . Hepatitis C    HEP C, never treated, 2007    Past Surgical History:  Procedure Laterality Date  . CERVICAL DISC  SURGERY  2001   anterior  . COLONOSCOPY WITH PROPOFOL N/A 03/14/2017   Procedure: COLONOSCOPY WITH PROPOFOL;  Surgeon: Danie Binder, MD;  [facility];  Service: Endoscopy;  Laterality: N/A;  8:15am  . LUMBAR SPINE SURGERY  2015  . POLYPECTOMY  03/14/2017   Procedure: POLYPECTOMY;  Surgeon: Danie Binder, MD;  [facility];  Service: Endoscopy;;  rectum    Family History  Problem Relation Age of Onset  . Cancer Mother   . Colon cancer Neg Hx     Social History:  reports that he has been smoking Cigarettes.  He has a 25.00 pack-year smoking history. He has never used smokeless tobacco. He reports that he does not drink alcohol or use drugs.  Allergies:  Allergies  Allergen Reactions  . Codeine Itching and Nausea Only    Medications: Prior to Admission medications   Medication Sig Start Date End Date Taking? Authorizing Provider  ALPRAZolam Duanne Moron) 0.5 MG tablet Take 0.5 mg by mouth 3 (three) times daily as needed for anxiety.   Yes [provider]  HYDROcodone-acetaminophen (NORCO) 10-325 MG tablet Take 1 tablet by mouth 4 (four) times daily.    Yes [provider]  ondansetron (ZOFRAN ODT) 8 MG disintegrating tablet Take 1 tablet (8 mg total) by mouth every 8 (eight) hours as needed for nausea or vomiting. 03/18/17  Yes Triplett, Tammy, PA-C  oxyCODONE-acetaminophen (PERCOCET) 7.5-325 MG tablet Take 1 tablet by mouth every 6 (six) hours as needed for severe pain. 03/18/17  Yes Triplett, Tammy, PA-C  tamsulosin (FLOMAX) 0.4 MG CAPS capsule Take 1 capsule (0.4 mg total) by mouth daily. 03/18/17  Yes Triplett, Tammy, PA-C    Scheduled Meds: .  stroke: mapping our early stages of recovery book   Does not apply Once  . aspirin  300 mg Rectal Daily   Or  . aspirin  325 mg Oral Daily  . atorvastatin  20 mg Oral q1800  . enoxaparin (LOVENOX) injection  40 mg Subcutaneous Q24H  . HYDROcodone-acetaminophen  1 tablet Oral QID  . sodium chloride  flush  3 mL Intravenous Q12H  . sodium chloride flush  3 mL Intravenous Q12H  . tamsulosin  0.4 mg Oral Daily   Continuous Infusions: . sodium chloride     PRN Meds:.sodium chloride, acetaminophen **OR** acetaminophen, ALPRAZolam, sodium chloride flush     Results for orders placed or performed during the hospital encounter of 03/20/17 (from the past 48 hour(s))  Urine rapid drug screen (hosp performed)not at [facility]     Status: Abnormal   Collection Time: 03/20/17  8:44 AM  Result Value Ref Range   Opiates POSITIVE (A) NONE DETECTED   Cocaine NONE DETECTED NONE DETECTED   Benzodiazepines NONE DETECTED NONE DETECTED   Amphetamines NONE DETECTED NONE DETECTED   Tetrahydrocannabinol NONE DETECTED NONE DETECTED   Barbiturates NONE DETECTED NONE DETECTED    Comment:        DRUG SCREEN FOR MEDICAL PURPOSES ONLY.  IF CONFIRMATION IS NEEDED FOR ANY PURPOSE, NOTIFY LAB WITHIN 5 DAYS.        LOWEST DETECTABLE LIMITS FOR URINE DRUG  SCREEN Drug Class       Cutoff (ng/mL) Amphetamine      1000 Barbiturate      200 Benzodiazepine   798 Tricyclics       921 Opiates          300 Cocaine          300 THC              50   Urinalysis, Routine w reflex microscopic     Status: Abnormal   Collection Time: 03/20/17  8:44 AM  Result Value Ref Range   Color, Urine YELLOW YELLOW   APPearance HAZY (A) CLEAR   Specific Gravity, Urine 1.024 1.005 - 1.030   pH 5.0 5.0 - 8.0   Glucose, UA NEGATIVE NEGATIVE mg/dL   Hgb urine dipstick LARGE (A) NEGATIVE   Bilirubin Urine NEGATIVE NEGATIVE   Ketones, ur 20 (A) NEGATIVE mg/dL   Protein, ur 30 (A) NEGATIVE mg/dL   Nitrite NEGATIVE NEGATIVE   Leukocytes, UA SMALL (A) NEGATIVE   RBC / HPF TOO NUMEROUS TO COUNT 0 - 5 RBC/hpf   WBC, UA TOO NUMEROUS TO COUNT 0 - 5 WBC/hpf   Bacteria, UA NONE SEEN NONE SEEN   Squamous Epithelial / LPF 0-5 (A) NONE SEEN   Mucous PRESENT   I-stat troponin, ED (not at [facility])     Status: None   Collection Time:  03/20/17  8:50 AM  Result Value Ref Range   Troponin i, poc 0.00 0.00 - 0.08 ng/mL   Comment 3            Comment: Due to the release kinetics of cTnI, a negative result within the first hours of the onset of symptoms does not rule out myocardial infarction with certainty. If myocardial infarction is still suspected, repeat the test at appropriate intervals.   Ethanol     Status: None   Collection Time: 03/20/17  8:52 AM  Result Value Ref Range   Alcohol, Ethyl (B) <5 <5 mg/dL    Comment:        LOWEST DETECTABLE LIMIT FOR SERUM ALCOHOL IS 5 mg/dL FOR MEDICAL PURPOSES ONLY   Protime-INR     Status: None   Collection Time: 03/20/17  8:52 AM  Result Value Ref Range   Prothrombin Time 13.5 11.4 - 15.2 seconds   INR 1.03   APTT     Status: None   Collection Time: 03/20/17  8:52 AM  Result Value Ref Range   aPTT 25 24 - 36 seconds  CBC     Status: Abnormal   Collection Time: 03/20/17  8:52 AM  Result Value Ref Range   WBC 12.5 (H) 4.0 - 10.5 K/uL   RBC 4.98 4.22 - 5.81 MIL/uL   Hemoglobin 15.2 13.0 - 17.0 g/dL   HCT 45.8 39.0 - 52.0 %   MCV 92.0 78.0 - 100.0 fL   MCH 30.5 26.0 - 34.0 pg   MCHC 33.2 30.0 - 36.0 g/dL   RDW 13.5 11.5 - 15.5 %   Platelets 297 150 - 400 K/uL  Differential     Status: Abnormal   Collection Time: 03/20/17  8:52 AM  Result Value Ref Range   Neutrophils Relative % 72 %   Neutro Abs 9.0 (H) 1.7 - 7.7 K/uL   Lymphocytes Relative 20 %   Lymphs Abs 2.5 0.7 - 4.0 K/uL   Monocytes Relative 8 %   Monocytes Absolute 1.0 0.1 - 1.0 K/uL  Eosinophils Relative 0 %   Eosinophils Absolute 0.1 0.0 - 0.7 K/uL   Basophils Relative 0 %   Basophils Absolute 0.0 0.0 - 0.1 K/uL  Comprehensive metabolic panel     Status: Abnormal   Collection Time: 03/20/17  8:52 AM  Result Value Ref Range   Sodium 140 135 - 145 mmol/L   Potassium 4.0 3.5 - 5.1 mmol/L    Comment: DELTA CHECK NOTED   Chloride 103 101 - 111 mmol/L   CO2 27 22 - 32 mmol/L   Glucose, Bld 101  (H) 65 - 99 mg/dL   BUN 24 (H) 6 - 20 mg/dL   Creatinine, Ser 1.08 0.61 - 1.24 mg/dL   Calcium 9.4 8.9 - 10.3 mg/dL   Total Protein 7.7 6.5 - 8.1 g/dL   Albumin 4.4 3.5 - 5.0 g/dL   AST 21 15 - 41 U/L   ALT 24 17 - 63 U/L   Alkaline Phosphatase 79 38 - 126 U/L   Total Bilirubin 0.9 0.3 - 1.2 mg/dL   GFR calc non Af Amer >60 >60 mL/min   GFR calc Af Amer >60 >60 mL/min    Comment: (NOTE) The eGFR has been calculated using the CKD EPI equation. This calculation has not been validated in all clinical situations. eGFR's persistently <60 mL/min signify possible Chronic Kidney Disease.    Anion gap 10 5 - 15    Studies/Results:  BRAIN MRI W/WO AND MRA FINDINGS: MRI HEAD FINDINGS  Brain: Acute nonhemorrhagic infarct involves the left basal ganglia extending towards the corona radiata. The area of infarction extends 3.5 cm cephalo caudad. T2 hyperintensities are associated.  Lacunar infarcts of the basal ganglia bilaterally are remote. Mild white matter changes are noted otherwise. The brainstem and cerebellum are normal. The internal auditory canal is within normal limits.  Vascular: Flow is present in the major intracranial arteries.  Skull and upper cervical spine: The skullbase is within normal limits. The craniocervical junction is normal. Midline sagittal structures are unremarkable.  Sinuses/Orbits: Mucosal thickening and fluid is present in the left maxillary sinus.  Bilateral mastoid effusions are present. The globes and orbits are within normal limits.  Other: None.  MRA HEAD FINDINGS  Atherosclerotic irregularity is present within the cavernous internal carotid arteries bilaterally without a significant stenosis. A 1.5 mm aneurysm or more likely infundibulum is present at the left posterior communicating artery. The left A1 segment is dominant to the right. There is no focal stenosis. Mild irregularity is present in the M1 segments bilaterally  without significant stenoses. The MCA bifurcations are intact. There is high-grade stenosis or occlusion of the anterior inferior left M2 segment which may associated with the infarct.  A moderate to severe stenosis is present at the right vertebral artery a proximal to the PICA origin. The left vertebral artery is the dominant vessel. PICA origins are intact bilaterally. The basilar artery is normal. Both posterior cerebral arteries originate from the basilar tip. There is tapering of distal PCA branch vessels bilaterally.  IMPRESSION: 1. Acute nonhemorrhagic infarct of the left basal ganglia extending to the corona radiata measures 3.5 cm in cephalo caudad dimension. 2. Lacunar infarcts of the bilateral basal ganglia are remote. 3. High-grade stenosis or occlusion of the anterior inferior left M2 segment, likely related to the infarct territory. 4. Moderate to high-grade stenosis of the proximal right vertebral artery with distal flow beyond the PICA  ADDENDUM: 1.5 mm aneurysm or more likely infundibulum of the left posterior communicating artery.  This could be followed with a MRA or CTA at 6-12 months.   The brain MRI is reviewed in person and shows a large vessel infarct involving the left deep brain matter approximately a large section of the centrum semiovale. This is a moderate size stroke seen on 7 cuts and associated with increased signal on DWI in reduced signal on the ADC scan. No microhemorrhages are appreciated. There are several bilateral basal ganglia lacunar infarcts with reduced signal seen on T1.   TTE - Left ventricle: The cavity size was normal. Systolic function was   normal. The estimated ejection fraction was in the range of 60%   to 65%. Wall motion was normal; there were no regional wall   motion abnormalities. Left ventricular diastolic function   parameters were normal. - Atrial septum: No defect or patent foramen ovale was  identified.  Impressions:  - No cardiac source of emboli was indentified.    Carotid Doppler shows small plaque left ICA but no hemodynamic stenosis.      Riham Polyakov A. Merlene Laughter, M.D.  Diplomate, Tax adviser of Psychiatry and Neurology ( Neurology). 03/20/2017, 6:35 PM

## 2017-03-20 NOTE — ED Triage Notes (Signed)
Pt reports had a colonoscopy on Tuesday.  Family noticed pt's speech was slow and slurred after returning home but thought it was due to the sedation.  Wednesday pt fell x 2 and was having r sided weakness.  Saturday pt came to ER for abd pain and was told he had a kidney stone.  Pt still has r sided weakness, slurred speech, and r sided facial droop.

## 2017-03-20 NOTE — Telephone Encounter (Signed)
Pt and his son came in and was told that he needed to go to the ER per AB recommendations if he was having that much pain.

## 2017-03-20 NOTE — Telephone Encounter (Signed)
[facility] called to say Dr Phillips OdorGolding was sending the patient over to our office for guidance on what he should do. Pt had colonoscopy on 8/7 and was having severe abdominal pain and was found on his bathroom floor holding his stomach. I told the girl [facility] that we could not see patient today as a patient, but she said the nurse could talk with him and his son.

## 2017-03-20 NOTE — ED Provider Notes (Signed)
Emergency Department Provider Note   I have reviewed the triage vital signs and the nursing notes.   HISTORY  Chief Complaint R sided weakness   HPI Brendan Charles is a 61 y.o. male with PMH of anxiety, chronic back pain, and Hep C presents to the ED for evaluation of right-sided weakness and slurred speech. The patient had a colonoscopy 6 days prior and states that when he returned home from that procedure he noticed the symptoms. He's had 2 falls this week because of weakness. Family initially thought that the slurred speech was due to his sedation given in the procedure but symptoms persisted.Patient was actually seen 2 days ago in the emergency department with abdominal pain. He does not believe he mentioned the weakness during that appointment. He denies any worsening symptoms. No new injuries or weakness. The patient's family at bedside report a possible TIA several years prior.    Past Medical History:  Diagnosis Date  . Anxiety   . Chronic back pain   . CVA (cerebral vascular accident) (HCC) 03/20/2017  . Hepatitis C    HEP C, never treated, 2007    Patient Active Problem List   Diagnosis Date Noted  . CVA (cerebral vascular accident) (HCC) 03/20/2017  . Hepatitis C 02/14/2017  . Encounter for screening colonoscopy 02/14/2017  . Spondylolisthesis at L3-L4 level 06/03/2014    Past Surgical History:  Procedure Laterality Date  . CERVICAL DISC SURGERY  2001   anterior  . COLONOSCOPY WITH PROPOFOL N/A 03/14/2017   Procedure: COLONOSCOPY WITH PROPOFOL;  Surgeon: West Bali, MD;  Location: [facility];  Service: Endoscopy;  Laterality: N/A;  8:15am  . LUMBAR SPINE SURGERY  2015  . POLYPECTOMY  03/14/2017   Procedure: POLYPECTOMY;  Surgeon: West Bali, MD;  Location: [facility];  Service: Endoscopy;;  rectum      Allergies Codeine  Family History  Problem Relation Age of Onset  . Colon cancer Neg Hx     Social History Social History  Substance  Use Topics  . Smoking status: Current Every Day Smoker    Packs/day: 0.50    Years: 50.00    Types: Cigarettes  . Smokeless tobacco: Never Used  . Alcohol use Yes     Comment: rarely    Review of Systems  Constitutional: No fever/chills Eyes: No visual changes. ENT: No sore throat. Cardiovascular: Denies chest pain. Respiratory: Denies shortness of breath. Gastrointestinal: No abdominal pain.  No nausea, no vomiting.  No diarrhea.  No constipation. Genitourinary: Negative for dysuria. Musculoskeletal: Negative for back pain. Skin: Negative for rash. Neurological: Negative for headaches. Positive right sided weakness and numbness with positive slurred speech.   10-point ROS otherwise negative.  ____________________________________________   PHYSICAL EXAM:  VITAL SIGNS: ED Triage Vitals  Enc Vitals Group     BP 03/20/17 0832 (!) 152/87     Pulse Rate 03/20/17 0832 86     Resp 03/20/17 0832 20     Temp 03/20/17 0833 98.4 F (36.9 C)     Temp Source 03/20/17 0832 Oral     SpO2 03/20/17 0832 99 %     Weight 03/20/17 0830 158 lb (71.7 kg)     Height 03/20/17 0830 5\' 9"  (1.753 m)   Constitutional: Alert and oriented. Well appearing and in no acute distress. Eyes: Conjunctivae are normal. PERRL. EOMI. Head: Atraumatic. Nose: No congestion/rhinnorhea. Mouth/Throat: Mucous membranes are moist. Neck: No stridor.   Cardiovascular: Normal rate, regular rhythm.  Good peripheral circulation. Grossly normal heart sounds.   Respiratory: Normal respiratory effort.  No retractions. Lungs CTAB. Gastrointestinal: Soft and nontender. No distention.  Musculoskeletal: No lower extremity tenderness nor edema. No gross deformities of extremities. Neurologic:  Normal language. Positive slurred speech and slight right facial asymmetry at rest. 4/5 strength in RUE with pronator drift and 4+/5 in the RLE. Normal strength and sensation in the LUE and LLE.  Skin:  Skin is warm, dry and intact.  No rash noted.  ____________________________________________   LABS (all labs ordered are listed, but only abnormal results are displayed)  Labs Reviewed  CBC - Abnormal; Notable for the following:       Result Value   WBC 12.5 (*)    All other components within normal limits  DIFFERENTIAL - Abnormal; Notable for the following:    Neutro Abs 9.0 (*)    All other components within normal limits  COMPREHENSIVE METABOLIC PANEL - Abnormal; Notable for the following:    Glucose, Bld 101 (*)    BUN 24 (*)    All other components within normal limits  RAPID URINE DRUG SCREEN, HOSP PERFORMED - Abnormal; Notable for the following:    Opiates POSITIVE (*)    All other components within normal limits  URINALYSIS, ROUTINE W REFLEX MICROSCOPIC - Abnormal; Notable for the following:    APPearance HAZY (*)    Hgb urine dipstick LARGE (*)    Ketones, ur 20 (*)    Protein, ur 30 (*)    Leukocytes, UA SMALL (*)    Squamous Epithelial / LPF 0-5 (*)    All other components within normal limits  ETHANOL  PROTIME-INR  APTT  I-STAT TROPONIN, ED   ____________________________________________  EKG   EKG Interpretation  Date/Time:  Monday March 20 2017 08:33:51 EDT Ventricular Rate:  79 PR Interval:    QRS Duration: 95 QT Interval:  387 QTC Calculation: 444 R Axis:   77 Text Interpretation:  Sinus rhythm No STEMI.  Confirmed by Alona Bene [phone]) on 03/20/2017 8:36:43 AM       ____________________________________________  RADIOLOGY  Ct Head Wo Contrast  Result Date: 03/20/2017 CLINICAL DATA:  Right side weakness EXAM: CT HEAD WITHOUT CONTRAST TECHNIQUE: Contiguous axial images were obtained from the base of the skull through the vertex without intravenous contrast. COMPARISON:  None FINDINGS: Brain: Chronic microvascular disease throughout the deep white matter. Old left periventricular lacunar infarcts. Old right internal capsule lacunar infarct. No acute intracranial abnormality.  Specifically, no hemorrhage, hydrocephalus, mass lesion, acute infarction, or significant intracranial injury. Vascular: No hyperdense vessel or unexpected calcification. Skull: No acute calvarial abnormality. Sinuses/Orbits: Visualized paranasal sinuses and mastoids clear. Orbital soft tissues unremarkable. Other: None IMPRESSION: Chronic microvascular disease and old bilateral lacunar infarcts. No acute intracranial abnormality. Electronically Signed   By: Charlett Nose M.D.   On: 03/20/2017 10:03   Mr Maxine Glenn Head Wo Contrast  Addendum Date: 03/20/2017   ADDENDUM REPORT: 03/20/2017 12:01 ADDENDUM: 1.5 mm aneurysm or more likely infundibulum of the left posterior communicating artery. This could be followed with a MRA or CTA at 6-12 months. Electronically Signed   By: Marin Roberts M.D.   On: 03/20/2017 12:01   Result Date: 03/20/2017 CLINICAL DATA:  Focal neuro deficit for greater than 6 hours. Stroke suspected. Abnormal speech and right-sided weakness on Wednesday last week after colonoscopy onto state. Right-sided facial droop. EXAM: MRI HEAD WITHOUT CONTRAST MRA HEAD WITHOUT CONTRAST TECHNIQUE: Multiplanar, multiecho pulse sequences of the brain  and surrounding structures were obtained without intravenous contrast. Angiographic images of the head were obtained using MRA technique without contrast. COMPARISON:  None. CT head without contrast from the same day. FINDINGS: MRI HEAD FINDINGS Brain: Acute nonhemorrhagic infarct involves the left basal ganglia extending towards the corona radiata. The area of infarction extends 3.5 cm cephalo caudad. T2 hyperintensities are associated. Lacunar infarcts of the basal ganglia bilaterally are remote. Mild white matter changes are noted otherwise. The brainstem and cerebellum are normal. The internal auditory canal is within normal limits. Vascular: Flow is present in the major intracranial arteries. Skull and upper cervical spine: The skullbase is within normal  limits. The craniocervical junction is normal. Midline sagittal structures are unremarkable. Sinuses/Orbits: Mucosal thickening and fluid is present in the left maxillary sinus. Bilateral mastoid effusions are present. The globes and orbits are within normal limits. Other: None. MRA HEAD FINDINGS Atherosclerotic irregularity is present within the cavernous internal carotid arteries bilaterally without a significant stenosis. A 1.5 mm aneurysm or more likely infundibulum is present at the left posterior communicating artery. The left A1 segment is dominant to the right. There is no focal stenosis. Mild irregularity is present in the M1 segments bilaterally without significant stenoses. The MCA bifurcations are intact. There is high-grade stenosis or occlusion of the anterior inferior left M2 segment which may associated with the infarct. A moderate to severe stenosis is present at the right vertebral artery a proximal to the PICA origin. The left vertebral artery is the dominant vessel. PICA origins are intact bilaterally. The basilar artery is normal. Both posterior cerebral arteries originate from the basilar tip. There is tapering of distal PCA branch vessels bilaterally. IMPRESSION: 1. Acute nonhemorrhagic infarct of the left basal ganglia extending to the corona radiata measures 3.5 cm in cephalo caudad dimension. 2. Lacunar infarcts of the bilateral basal ganglia are remote. 3. High-grade stenosis or occlusion of the anterior inferior left M2 segment, likely related to the infarct territory. 4. Moderate to high-grade stenosis of the proximal right vertebral artery with distal flow beyond the PICA. Electronically Signed: By: Marin Robertshristopher  Mattern M.D. On: 03/20/2017 11:42   Mr Brain W And Wo Contrast  Addendum Date: 03/20/2017   ADDENDUM REPORT: 03/20/2017 12:01 ADDENDUM: 1.5 mm aneurysm or more likely infundibulum of the left posterior communicating artery. This could be followed with a MRA or CTA at 6-12  months. Electronically Signed   By: Marin Robertshristopher  Mattern M.D.   On: 03/20/2017 12:01   Result Date: 03/20/2017 CLINICAL DATA:  Focal neuro deficit for greater than 6 hours. Stroke suspected. Abnormal speech and right-sided weakness on Wednesday last week after colonoscopy onto state. Right-sided facial droop. EXAM: MRI HEAD WITHOUT CONTRAST MRA HEAD WITHOUT CONTRAST TECHNIQUE: Multiplanar, multiecho pulse sequences of the brain and surrounding structures were obtained without intravenous contrast. Angiographic images of the head were obtained using MRA technique without contrast. COMPARISON:  None. CT head without contrast from the same day. FINDINGS: MRI HEAD FINDINGS Brain: Acute nonhemorrhagic infarct involves the left basal ganglia extending towards the corona radiata. The area of infarction extends 3.5 cm cephalo caudad. T2 hyperintensities are associated. Lacunar infarcts of the basal ganglia bilaterally are remote. Mild white matter changes are noted otherwise. The brainstem and cerebellum are normal. The internal auditory canal is within normal limits. Vascular: Flow is present in the major intracranial arteries. Skull and upper cervical spine: The skullbase is within normal limits. The craniocervical junction is normal. Midline sagittal structures are unremarkable. Sinuses/Orbits: Mucosal thickening  and fluid is present in the left maxillary sinus. Bilateral mastoid effusions are present. The globes and orbits are within normal limits. Other: None. MRA HEAD FINDINGS Atherosclerotic irregularity is present within the cavernous internal carotid arteries bilaterally without a significant stenosis. A 1.5 mm aneurysm or more likely infundibulum is present at the left posterior communicating artery. The left A1 segment is dominant to the right. There is no focal stenosis. Mild irregularity is present in the M1 segments bilaterally without significant stenoses. The MCA bifurcations are intact. There is  high-grade stenosis or occlusion of the anterior inferior left M2 segment which may associated with the infarct. A moderate to severe stenosis is present at the right vertebral artery a proximal to the PICA origin. The left vertebral artery is the dominant vessel. PICA origins are intact bilaterally. The basilar artery is normal. Both posterior cerebral arteries originate from the basilar tip. There is tapering of distal PCA branch vessels bilaterally. IMPRESSION: 1. Acute nonhemorrhagic infarct of the left basal ganglia extending to the corona radiata measures 3.5 cm in cephalo caudad dimension. 2. Lacunar infarcts of the bilateral basal ganglia are remote. 3. High-grade stenosis or occlusion of the anterior inferior left M2 segment, likely related to the infarct territory. 4. Moderate to high-grade stenosis of the proximal right vertebral artery with distal flow beyond the PICA. Electronically Signed: By: Marin Roberts M.D. On: 03/20/2017 11:42    ____________________________________________   PROCEDURES  Procedure(s) performed:   Procedures  None ____________________________________________   INITIAL IMPRESSION / ASSESSMENT AND PLAN / ED COURSE  Pertinent labs & imaging results that were available during my care of the patient were reviewed by me and considered in my medical decision making (see chart for details).  Patient presents to the emergency department with right sided neuro deficits concerning for stroke. Symptoms began 6 days prior. He was seen in the emergency department 2 days ago with abdominal pain but did not mention weakness at that time. Patient is far outside of any stroke window. Well another concern is that a liver and possible lung mass were found on CT from 2 days prior. There is possibility this could be deficits from metastatic disease but currently deficits are in left MCA territory making CVA more likely.   Spoke with Dr. Gerilyn Pilgrim with Neurology who will see  in consultation. Agree with plan for MRI brain w/ and w/o contrast with liver mass and lung lesion to evaluate for metastatic disease.   10:53 AM Called the radiology technician saying that MRI appears to be positive for stroke. I gave verbal order to add MRA.   Discussed patient's case with Hospitalist, Dr. Irene Limbo. Patient and family (if present) updated with plan. Care transferred to Hospitalist service.  I reviewed all nursing notes, vitals, pertinent old records, EKGs, labs, imaging (as available).  ____________________________________________  FINAL CLINICAL IMPRESSION(S) / ED DIAGNOSES  Final diagnoses:  Right sided weakness     MEDICATIONS GIVEN DURING THIS VISIT:  Medications  gadobenate dimeglumine (MULTIHANCE) injection 15 mL (15 mLs Intravenous Contrast Given 03/20/17 1041)     NEW OUTPATIENT MEDICATIONS STARTED DURING THIS VISIT:  None   Note:  This document was prepared using Dragon voice recognition software and may include unintentional dictation errors.  Alona Bene, MD Emergency Medicine   Amaris Delafuente, Arlyss Repress, MD 03/20/17 [phone]

## 2017-03-20 NOTE — Evaluation (Signed)
Speech Language Pathology Evaluation Patient Details Name: Brendan Charles MRN: [id number] DOB: 1955/12/11 Today's Date: 03/20/2017 Time: 1635-1700 SLP Time Calculation (min) (ACUTE ONLY): 25 min  Problem List:  Patient Active Problem List   Diagnosis Date Noted  . CVA (cerebral vascular accident) (HCC) 03/20/2017  . Hepatitis C 02/14/2017  . Encounter for screening colonoscopy 02/14/2017  . Spondylolisthesis at L3-L4 level 06/03/2014   Past Medical History:  Past Medical History:  Diagnosis Date  . Anxiety   . Chronic back pain   . CVA (cerebral vascular accident) (HCC) 03/20/2017  . Hepatitis C    HEP C, never treated, 2007   Past Surgical History:  Past Surgical History:  Procedure Laterality Date  . CERVICAL DISC SURGERY  2001   anterior  . COLONOSCOPY WITH PROPOFOL N/A 03/14/2017   Procedure: COLONOSCOPY WITH PROPOFOL;  Surgeon: West Bali, MD;  Location: [facility];  Service: Endoscopy;  Laterality: N/A;  8:15am  . LUMBAR SPINE SURGERY  2015  . POLYPECTOMY  03/14/2017   Procedure: POLYPECTOMY;  Surgeon: West Bali, MD;  Location: [facility];  Service: Endoscopy;;  rectum   HPI:  Brendan Charles is a 61 y.o. male with PMH of anxiety, chronic back pain, and Hep C presents to the ED for evaluation of right-sided weakness and slurred speech. The patient had a colonoscopy 6 days prior and states that when he returned home from that procedure he noticed the symptoms. He's had 2 falls this week because of weakness. Family initially thought that the slurred speech was due to his sedation given in the procedure but symptoms persisted.Patient was actually seen 2 days ago in the emergency department with abdominal pain. He does not believe he mentioned the weakness during that appointment. He denies any worsening symptoms. No new injuries or weakness. The patient's family at bedside report a possible TIA several years prior.    MRI: Acute nonhemorrhagic infarct of the  left basal ganglia extending to the corona radiata measures 3.5 cm in cephalo caudad dimension. 2. Lacunar infarcts of the bilateral basal ganglia are remote.   Assessment / Plan / Recommendation Clinical Impression  Pt assessed at bedside with son present. Pt presents with mild/mod expressive language impairment characterized by difficulty with thought formulation, word finding deficits, and mild dysarthria negatively impacting expressive communication. Comprehension appears intact. Pt became tearful at times and reported feeling "scared". SLP provided emotional support and encouragement that therapy to address right upper and lower extremity weakness and expressive language impairment would help. Further assessment into higher level cognitive deficits warranted at next level of care. Recommend f/u SLP services as outpatient vs inpatient rehab depending on PT/OT recommendations. SLP to follow. Pt passed RN swallow screen and SLP observed Pt consuming PM meal without overt signs or symptoms of aspiration.     SLP Assessment  SLP Recommendation/Assessment: Patient needs continued Speech Lanaguage Pathology Services SLP Visit Diagnosis: Aphasia (R47.01);Dysarthria and anarthria (R47.1)    Follow Up Recommendations  Outpatient SLP (pending PT/OT consults)    Frequency and Duration min 2x/week  2 weeks      SLP Evaluation Cognition  Overall Cognitive Status: Within Functional Limits for tasks assessed Arousal/Alertness: Awake/alert Orientation Level: Oriented X4 Memory: Appears intact Awareness: Appears intact Problem Solving: Appears intact Behaviors: Lability Safety/Judgment: Appears intact       Comprehension  Auditory Comprehension Overall Auditory Comprehension: Appears within functional limits for tasks assessed Yes/No Questions: Within Functional Limits Commands: Within Functional Limits Conversation: Complex  Visual Recognition/Discrimination Discrimination: Within Function  Limits Reading Comprehension Reading Status: Not tested    Expression Expression Primary Mode of Expression: Verbal Verbal Expression Overall Verbal Expression: Impaired Initiation: No impairment Automatic Speech: Name;Social Response Level of Generative/Spontaneous Verbalization: Conversation Repetition: Impaired Level of Impairment: Sentence level Naming: Impairment Responsive: 76-100% accurate Confrontation: Impaired Convergent: 75-100% accurate Divergent: 75-100% accurate Pragmatics: No impairment Interfering Components: Speech intelligibility Non-Verbal Means of Communication: Not applicable Written Expression Dominant Hand: Right Written Expression: Not tested   Oral / Motor  Oral Motor/Sensory Function Overall Oral Motor/Sensory Function: Within functional limits Motor Speech Overall Motor Speech: Impaired Respiration: Within functional limits Phonation: Normal Resonance: Within functional limits Articulation: Impaired Level of Impairment: Conversation Intelligibility: Intelligibility reduced Word: 75-100% accurate Phrase: 75-100% accurate Sentence: 75-100% accurate Conversation: 75-100% accurate Motor Planning: Witnin functional limits Motor Speech Errors: Not applicable   Thank you,  Havery MorosDabney Britta Louth, CCC-SLP [phone]                     Nihira Puello 03/20/2017, 5:17 PM

## 2017-03-20 NOTE — Progress Notes (Signed)
Pt admitted to Room 333 with diagnosis of CVA.  Neuiro checks initiated.

## 2017-03-20 NOTE — H&P (Signed)
History and Physical  Brendan Charles:[id number] DOB: 07-19-1956 DOA: 03/20/2017  PCP: Elfredia Nevins, MD  Patient coming from: home  Chief Complaint: Right arm weakness  HPI:  61 year old man PMH stroke, hepatitis C, presented to the emergency department with complaint of right upper and right lower extremity weakness. Examination suggested stroke but the cause of chronicity of symptoms patient was not a candidate for tPA or code stroke evaluation. MRI confirmed stroke and he was admitted for further evaluation.  Patient underwent colonoscopy 03/14/2017. He reports thereafter that day he had right upper and right lower extremity weakness. He apparently did not mention this to anyone. He is right-hand dominant. Over the course the next several days he had several falls and difficulty walking because of weakness of his leg. No apparent aggravating or alleviating factors. He had difficulty eating and so had to use his left hand side of his right hand. He's also had difficulty with speaking. Because of persistent symptoms he came to the emergency department for further evaluation.  He was seen in the emergency department 8/11 for left sided abdominal pain, nausea and vomiting. There is no documentation that the patient reported weakness at that time.  ED Course: Afebrile, vital signs stable.   Review of Systems:  Negative for fever, new visual changes, sore throat, rash, new muscle aches, chest pain, shortness of breath, dysuria, bleeding, nausea, vomiting, abdominal pain.   Past Medical History:  Diagnosis Date  . Anxiety   . Chronic back pain   . CVA (cerebral vascular accident) (HCC) 03/20/2017  . Hepatitis C    HEP C, never treated, 2007    Past Surgical History:  Procedure Laterality Date  . CERVICAL DISC SURGERY  2001   anterior  . COLONOSCOPY WITH PROPOFOL N/A 03/14/2017   Procedure: COLONOSCOPY WITH PROPOFOL;  Surgeon: West Bali, MD;  Location: [facility];  Service:  Endoscopy;  Laterality: N/A;  8:15am  . LUMBAR SPINE SURGERY  2015  . POLYPECTOMY  03/14/2017   Procedure: POLYPECTOMY;  Surgeon: West Bali, MD;  Location: [facility];  Service: Endoscopy;;  rectum     reports that he has been smoking Cigarettes.  He has a 25.00 pack-year smoking history. He has never used smokeless tobacco. He reports that he does not drink alcohol or use drugs. Mobility: Ambulatory  Allergies  Allergen Reactions  . Codeine Itching and Nausea Only    Family History  Problem Relation Age of Onset  . Cancer Mother   . Colon cancer Neg Hx      Prior to Admission medications   Medication Sig Start Date End Date Taking? Authorizing Provider  ALPRAZolam Prudy Feeler) 0.5 MG tablet Take 0.5 mg by mouth 3 (three) times daily as needed for anxiety.   Yes [provider]  HYDROcodone-acetaminophen (NORCO) 10-325 MG tablet Take 1 tablet by mouth 4 (four) times daily.    Yes [provider]  ondansetron (ZOFRAN ODT) 8 MG disintegrating tablet Take 1 tablet (8 mg total) by mouth every 8 (eight) hours as needed for nausea or vomiting. 03/18/17  Yes Triplett, Tammy, PA-C  oxyCODONE-acetaminophen (PERCOCET) 7.5-325 MG tablet Take 1 tablet by mouth every 6 (six) hours as needed for severe pain. 03/18/17  Yes Triplett, Tammy, PA-C  tamsulosin (FLOMAX) 0.4 MG CAPS capsule Take 1 capsule (0.4 mg total) by mouth daily. 03/18/17  Yes Triplett, Tammy, PA-C    Physical Exam: 97.8, 20, 78, 1:30/78, 99% on room air  Constitutional. Appears calm,  comfortable.  Eyes. Pupils, irises, lids appear unremarkable.  ENT. Grossly normal hearing, lips, tongue.  Respiratory. Clear to auscultation bilaterally. No wheezes, rales or rhonchi. Normal respiratory effort.  Cardiovascular. Regular rate and rhythm. No murmur, rub or gallop. No lower extremity edema.  Abdomen soft, nontender, nondistended. No hepatomegaly.  Skin. No rash or induration, nontender to palpation. No  nodules noted.  Musculoskeletal. Tone and strength left upper and left lower extremity appear normal. Strength 5/5 and left extremities. There is weakness of the right upper and right lower extremity, 4 minus/5. No atrophy noted. Arms and legs appear grossly normal. No pain with palpation.  Neurologic. Cranial nerves II-12 appear intact. There is no dysdiadochokinesis of the upper extremities. No pronator drift. Sensation grossly intact all extremities. He has expressive aphasia.  Psychiatric. Grossly normal mood and affect.  Wt Readings from Last 3 Encounters:  03/20/17 66.7 kg (147 lb 0.8 oz)  03/18/17 71.7 kg (158 lb)  03/08/17 71.9 kg (158 lb 9.6 oz)    I have personally reviewed following labs and imaging studies  Labs:   Complete metabolic panel unremarkable.  Troponin negative.  WBC 12.5, remainder CBC unremarkable.  INR within normal limits.  Urinalysis grossly positive.  Serum alcohol level negative.  Urine drug screen appropriately positive for opiates.  Imaging studies:   MRI brain notable for acute nonhemorrhagic infarct left basal ganglia. MRA head notable for high-grade stenosis or occlusion of the anterior inferior left M2 segment  Medical tests:   EKG independently reviewed, sinus rhythm, no acute changes.  Test discussed with performing physician:    Decision to obtain old records:     Review and summation of old records:     Principal Problem:   CVA (cerebral vascular accident) [facility](HCC) Active Problems:   Hydronephrosis, left   Aortic atherosclerosis (HCC)   Assessment/Plan Acute nonhemorrhagic infarct left basal ganglia extending to the corona radiata -Patient reports symptoms since 8/7 (right upper, right lower extreme weakness and difficulty speaking) and therefore not a candidate for tPA secondary to delay in presentation. -Check echocardiogram, bilateral carotid ultrasound, consult neurology -Aspirin, statin  Asymptomatic  bacteriuria  Left hydronephrosis with obstructing left ureteral calculus seen on CT 8/11 on previous ER visit. -Relatively asymptomatic. Renal function preserved. Patient reports he has follow-up arranged with urology next week.  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 aortic -This could be followed with a MRA or CTA at 6-12 months.  Nonspecific indeterminate lesion within the liver, recommend MRI in 6 months.  Question 8mm right middle lobe nodule. Radiologist recommended noncontrast set chest CT 6-12 months.  Hepatitis C.  -Suggest outpatient evaluation with GI.  Aortic atherosclerosis. -Statin. Follow-up in the outpatient setting.     Severity of Illness: The appropriate patient status for this patient is INPATIENT. Inpatient status is judged to be reasonable and necessary in order to provide the required intensity of service to ensure the patient's safety. The patient's presenting symptoms, physical exam findings, and initial radiographic and laboratory data in the context of their chronic comorbidities is felt to place them at high risk for further clinical deterioration. Furthermore, it is not anticipated that the patient will be medically stable for discharge from the hospital within 2 midnights of admission. The following factors support the patient status of inpatient.   * I certify that at the point of admission it is my clinical judgment that the patient will require inpatient hospital care spanning beyond 2 midnights from  the point of admission due to high intensity of service, high risk for further deterioration and high frequency of surveillance required.*     DVT prophylaxis: enoxaparin Code Status: full Family Communication: none     Time spent: 60 minutes  Brendia Sacks, MD  [facility] Direct contact: [phone] --Via amion app OR  --[email or web address]; password [email or web address]  7PM-7AM contact night coverage as  above  03/20/2017, 5:36 PM

## 2017-03-20 NOTE — Progress Notes (Signed)
Pt admitted with adult children at bedside.  Appears great family support is available to patient as he states three adult sons.  He states one of his sons shares a home with him.  Pt is alert, oriented and cooperative.  During assessment/admission, patient became very emotional and cried when he stated, "This is not how I expected to be living my life."  Emotional encouragement offered to patient.  Explained PT/OT/ST consults were made and assessments would be taking place within the next 24 hours.  Pt verbalized understanding.  Stroke Recovery book given to patient with encouragement to read and ask questions if any concerns.  Pt noted to have right sided weakness upper and lower extremity.  Additional time needed for any ADL's, as patient is right handed.  It is also noted that Patient has expressive aphasia.  Additional time required when patient is verbal.  Patient is continent x 2. Urinal left at bedside.  Swallow screen was passed, and pt is currently on Heart Healthy diet.  He is currently high fall risk.  States two falls at home within the past week, after he states his stroke took place.  Noted, is an abrasion on posterior right knee and a scratch on lower right back.  No wound dressing orders needed.  Patient denies pain at this time.  No c/o at this time.  Chaplain Consult per patients request for Medical Advance Directive.  Patient remains in stable condition.  Will continue to monitor

## 2017-03-20 NOTE — ED Notes (Signed)
[facility]

## 2017-03-20 NOTE — Telephone Encounter (Signed)
REVIEWED. PT HAD CT AUG 11 WITH OBSTRUCTINGLEFT KIDNEY STONE AND ADDITIONAL ACUTE ABDOMINAL PATHOLOGY. ADMITTED TODAY WITH ACUTE CVA.

## 2017-03-20 NOTE — Progress Notes (Signed)
*  PRELIMINARY RESULTS* Echocardiogram 2D Echocardiogram has been performed.  Jeryl Columbialliott, Shakeia Krus 03/20/2017, 4:11 PM

## 2017-03-20 NOTE — ED Notes (Signed)
Attempted report x 2 

## 2017-03-20 NOTE — Telephone Encounter (Signed)
Noted  

## 2017-03-20 NOTE — ED Notes (Signed)
Attempted report x1. 

## 2017-03-21 DIAGNOSIS — I639 Cerebral infarction, unspecified: Secondary | ICD-10-CM

## 2017-03-21 LAB — LIPID PANEL
CHOL/HDL RATIO: 3.9 ratio
Cholesterol: 141 mg/dL (ref 0–200)
HDL: 36 mg/dL — AB (ref >40)
LDL CALC: 89 mg/dL (ref 0–99)
Triglycerides: 80 mg/dL (ref <150)
VLDL: 16 mg/dL (ref 0–40)

## 2017-03-21 LAB — VITAMIN B12: VITAMIN B 12: 318 pg/mL (ref 180–914)

## 2017-03-21 LAB — TSH: TSH: 2.892 u[IU]/mL (ref 0.350–4.500)

## 2017-03-21 MED ORDER — CLOPIDOGREL BISULFATE 75 MG PO TABS: 75.0000 mg | ORAL_TABLET | Freq: Every day | ORAL | 3 refills | Status: DC

## 2017-03-21 MED ORDER — ATORVASTATIN CALCIUM 80 MG PO TABS: 80.0000 mg | ORAL_TABLET | Freq: Every day | ORAL | 3 refills | Status: DC

## 2017-03-21 MED ORDER — ASPIRIN 325 MG PO TABS: 325.0000 mg | ORAL_TABLET | Freq: Every day | ORAL | 12 refills | Status: DC

## 2017-03-21 NOTE — Progress Notes (Signed)
Brendan Charles discharged Home per MD order.  Discharge instructions reviewed and discussed with the patient, all questions and concerns answered. Copy of instructions and scripts given to patient.  Allergies as of 03/21/2017      Reactions   Codeine Itching, Nausea Only      Medication List    TAKE these medications   ALPRAZolam 0.5 MG tablet Commonly known as:  XANAX Take 0.5 mg by mouth 3 (three) times daily as needed for anxiety.   aspirin 325 MG tablet Take 1 tablet (325 mg total) by mouth daily.   atorvastatin 80 MG tablet Commonly known as:  LIPITOR Take 1 tablet (80 mg total) by mouth daily at 6 PM.   clopidogrel 75 MG tablet Commonly known as:  PLAVIX Take 1 tablet (75 mg total) by mouth daily with breakfast.   HYDROcodone-acetaminophen 10-325 MG tablet Commonly known as:  NORCO Take 1 tablet by mouth 4 (four) times daily.   ondansetron 8 MG disintegrating tablet Commonly known as:  ZOFRAN ODT Take 1 tablet (8 mg total) by mouth every 8 (eight) hours as needed for nausea or vomiting.   oxyCODONE-acetaminophen 7.5-325 MG tablet Commonly known as:  PERCOCET Take 1 tablet by mouth every 6 (six) hours as needed for severe pain.   tamsulosin 0.4 MG Caps capsule Commonly known as:  FLOMAX Take 1 capsule (0.4 mg total) by mouth daily.            Durable Medical Equipment        Start     Ordered   03/21/17 1045  For home use only DME Walker rolling  Once    Question:  Patient needs a walker to treat with the following condition  Answer:  Stroke (cerebrum) (HCC)   03/21/17 1044      Patients skin is clean, dry and intact, no evidence of skin break down. IV site discontinued and catheter remains intact. Site without signs and symptoms of complications. Dressing and pressure applied.  Patient escorted to car by NT in a wheelchair,  no distress noted upon discharge.  Brendan Charles 03/21/2017 5:24 PM

## 2017-03-21 NOTE — Discharge Summary (Signed)
Physician Discharge Summary  Brendan Charles ZOX:[id number] DOB: 1955/10/17 DOA: 03/20/2017  PCP: Elfredia Nevins, MD  Admit date: 03/20/2017 Discharge date: 03/21/2017  Time spent: 25 minutes  Recommendations for Outpatient Follow-up:   Patient will need both aspirin and Plavix for 3 months and then transition to aspirin probably in November 2018 after he sees his neurologist Recommend A1c, lipid panel November 2018 Ordered AFO splint for right leg as well as walker and home health on discharge Needs outpatient MRA or CTA in 6 months for left ostia communicating artery aneurysm Needs ultrasound liver and chest CT in about 3-6 months given nodule as well as indeterminate lesion in liver    Discharge Diagnoses:  Principal Problem:   CVA (cerebral vascular accident) [facility]) Active Problems:   Hydronephrosis, left   Aortic atherosclerosis (HCC)   Discharge Condition: Improved  Diet recommendation: Low-salt  Filed Weights   03/20/17 0830 03/20/17 1324  Weight: 71.7 kg (158 lb) 66.7 kg (147 lb 0.8 oz)    History of present illness:  61 year old male with known history of prior stroke, hepatitis C? Ever treated Recently seen for colonoscopy 8/7 and then noticed right upper and right lower extremity weakness with some facial droop Also having some dysnomia The emergency room 8/11 left-sided pain Finally admitted to the hospital on 8/13 with further weakness and found to have an acute nonhemorrhagic left basal ganglia infarct Neurology's consulted He was worked up for stroke Lipids were done and patient was placed on high intensity statin  He will need follow-up for hepatitis C as an outpatient as well as for aneurysm of left posterior infundibular artery     Discharge Exam: Vitals:   03/21/17 0500 03/21/17 0900  BP: 130/86 127/64  Pulse: 71 81  Resp: 20 18  Temp: 98.5 F (36.9 C) 98.1 F (36.7 C)  SpO2: 99% 99%     Alert oriented slightly anxious EOMI NCAT Chest  clinically clear Abdomen soft Moving all 4 limbs equally but has foot drop on the right side to mild flattening of right nasolabial fold  Discharge Instructions    Current Discharge Medication List    CONTINUE these medications which have NOT CHANGED   Details  ALPRAZolam (XANAX) 0.5 MG tablet Take 0.5 mg by mouth 3 (three) times daily as needed for anxiety.    HYDROcodone-acetaminophen (NORCO) 10-325 MG tablet Take 1 tablet by mouth 4 (four) times daily.     ondansetron (ZOFRAN ODT) 8 MG disintegrating tablet Take 1 tablet (8 mg total) by mouth every 8 (eight) hours as needed for nausea or vomiting. Qty: 20 tablet, Refills: 0    oxyCODONE-acetaminophen (PERCOCET) 7.5-325 MG tablet Take 1 tablet by mouth every 6 (six) hours as needed for severe pain. Qty: 20 tablet, Refills: 0    tamsulosin (FLOMAX) 0.4 MG CAPS capsule Take 1 capsule (0.4 mg total) by mouth daily. Qty: 15 capsule, Refills: 0       Allergies  Allergen Reactions  . Codeine Itching and Nausea Only      The results of significant diagnostics from this hospitalization (including imaging, microbiology, ancillary and laboratory) are listed below for reference.    Significant Diagnostic Studies: Ct Head Wo Contrast  Result Date: 03/20/2017 CLINICAL DATA:  Right side weakness EXAM: CT HEAD WITHOUT CONTRAST TECHNIQUE: Contiguous axial images were obtained from the base of the skull through the vertex without intravenous contrast. COMPARISON:  None FINDINGS: Brain: Chronic microvascular disease throughout the deep white matter. Old left periventricular lacunar infarcts.  Old right internal capsule lacunar infarct. No acute intracranial abnormality. Specifically, no hemorrhage, hydrocephalus, mass lesion, acute infarction, or significant intracranial injury. Vascular: No hyperdense vessel or unexpected calcification. Skull: No acute calvarial abnormality. Sinuses/Orbits: Visualized paranasal sinuses and mastoids clear.  Orbital soft tissues unremarkable. Other: None IMPRESSION: Chronic microvascular disease and old bilateral lacunar infarcts. No acute intracranial abnormality. Electronically Signed   By: Charlett NoseKevin  Dover M.D.   On: 03/20/2017 10:03   Mr Maxine GlennMra Head Wo Contrast  Addendum Date: 03/20/2017   ADDENDUM REPORT: 03/20/2017 12:01 ADDENDUM: 1.5 mm aneurysm or more likely infundibulum of the left posterior communicating artery. This could be followed with a MRA or CTA at 6-12 months. Electronically Signed   By: Marin Robertshristopher  Mattern M.D.   On: 03/20/2017 12:01   Result Date: 03/20/2017 CLINICAL DATA:  Focal neuro deficit for greater than 6 hours. Stroke suspected. Abnormal speech and right-sided weakness on Wednesday last week after colonoscopy onto state. Right-sided facial droop. EXAM: MRI HEAD WITHOUT CONTRAST MRA HEAD WITHOUT CONTRAST TECHNIQUE: Multiplanar, multiecho pulse sequences of the brain and surrounding structures were obtained without intravenous contrast. Angiographic images of the head were obtained using MRA technique without contrast. COMPARISON:  None. CT head without contrast from the same day. FINDINGS: MRI HEAD FINDINGS Brain: Acute nonhemorrhagic infarct involves the left basal ganglia extending towards the corona radiata. The area of infarction extends 3.5 cm cephalo caudad. T2 hyperintensities are associated. Lacunar infarcts of the basal ganglia bilaterally are remote. Mild white matter changes are noted otherwise. The brainstem and cerebellum are normal. The internal auditory canal is within normal limits. Vascular: Flow is present in the major intracranial arteries. Skull and upper cervical spine: The skullbase is within normal limits. The craniocervical junction is normal. Midline sagittal structures are unremarkable. Sinuses/Orbits: Mucosal thickening and fluid is present in the left maxillary sinus. Bilateral mastoid effusions are present. The globes and orbits are within normal limits. Other:  None. MRA HEAD FINDINGS Atherosclerotic irregularity is present within the cavernous internal carotid arteries bilaterally without a significant stenosis. A 1.5 mm aneurysm or more likely infundibulum is present at the left posterior communicating artery. The left A1 segment is dominant to the right. There is no focal stenosis. Mild irregularity is present in the M1 segments bilaterally without significant stenoses. The MCA bifurcations are intact. There is high-grade stenosis or occlusion of the anterior inferior left M2 segment which may associated with the infarct. A moderate to severe stenosis is present at the right vertebral artery a proximal to the PICA origin. The left vertebral artery is the dominant vessel. PICA origins are intact bilaterally. The basilar artery is normal. Both posterior cerebral arteries originate from the basilar tip. There is tapering of distal PCA branch vessels bilaterally. IMPRESSION: 1. Acute nonhemorrhagic infarct of the left basal ganglia extending to the corona radiata measures 3.5 cm in cephalo caudad dimension. 2. Lacunar infarcts of the bilateral basal ganglia are remote. 3. High-grade stenosis or occlusion of the anterior inferior left M2 segment, likely related to the infarct territory. 4. Moderate to high-grade stenosis of the proximal right vertebral artery with distal flow beyond the PICA. Electronically Signed: By: Marin Robertshristopher  Mattern M.D. On: 03/20/2017 11:42   Mr Brain W And Wo Contrast  Addendum Date: 03/20/2017   ADDENDUM REPORT: 03/20/2017 12:01 ADDENDUM: 1.5 mm aneurysm or more likely infundibulum of the left posterior communicating artery. This could be followed with a MRA or CTA at 6-12 months. Electronically Signed   By: Marin Robertshristopher  Mattern  M.D.   On: 03/20/2017 12:01   Result Date: 03/20/2017 CLINICAL DATA:  Focal neuro deficit for greater than 6 hours. Stroke suspected. Abnormal speech and right-sided weakness on Wednesday last week after colonoscopy  onto state. Right-sided facial droop. EXAM: MRI HEAD WITHOUT CONTRAST MRA HEAD WITHOUT CONTRAST TECHNIQUE: Multiplanar, multiecho pulse sequences of the brain and surrounding structures were obtained without intravenous contrast. Angiographic images of the head were obtained using MRA technique without contrast. COMPARISON:  None. CT head without contrast from the same day. FINDINGS: MRI HEAD FINDINGS Brain: Acute nonhemorrhagic infarct involves the left basal ganglia extending towards the corona radiata. The area of infarction extends 3.5 cm cephalo caudad. T2 hyperintensities are associated. Lacunar infarcts of the basal ganglia bilaterally are remote. Mild white matter changes are noted otherwise. The brainstem and cerebellum are normal. The internal auditory canal is within normal limits. Vascular: Flow is present in the major intracranial arteries. Skull and upper cervical spine: The skullbase is within normal limits. The craniocervical junction is normal. Midline sagittal structures are unremarkable. Sinuses/Orbits: Mucosal thickening and fluid is present in the left maxillary sinus. Bilateral mastoid effusions are present. The globes and orbits are within normal limits. Other: None. MRA HEAD FINDINGS Atherosclerotic irregularity is present within the cavernous internal carotid arteries bilaterally without a significant stenosis. A 1.5 mm aneurysm or more likely infundibulum is present at the left posterior communicating artery. The left A1 segment is dominant to the right. There is no focal stenosis. Mild irregularity is present in the M1 segments bilaterally without significant stenoses. The MCA bifurcations are intact. There is high-grade stenosis or occlusion of the anterior inferior left M2 segment which may associated with the infarct. A moderate to severe stenosis is present at the right vertebral artery a proximal to the PICA origin. The left vertebral artery is the dominant vessel. PICA origins are  intact bilaterally. The basilar artery is normal. Both posterior cerebral arteries originate from the basilar tip. There is tapering of distal PCA branch vessels bilaterally. IMPRESSION: 1. Acute nonhemorrhagic infarct of the left basal ganglia extending to the corona radiata measures 3.5 cm in cephalo caudad dimension. 2. Lacunar infarcts of the bilateral basal ganglia are remote. 3. High-grade stenosis or occlusion of the anterior inferior left M2 segment, likely related to the infarct territory. 4. Moderate to high-grade stenosis of the proximal right vertebral artery with distal flow beyond the PICA. Electronically Signed: By: Marin Roberts M.D. On: 03/20/2017 11:42   Ct Abdomen Pelvis W Contrast  Result Date: 03/18/2017 CLINICAL DATA:  LEFT lower quadrant pain and emesis since colonoscopy on Tuesday, multiple falls since Wednesday EXAM: CT ABDOMEN AND PELVIS WITH CONTRAST TECHNIQUE: Multidetector CT imaging of the abdomen and pelvis was performed using the standard protocol following bolus administration of intravenous contrast. Sagittal and coronal MPR images reconstructed from axial data set. CONTRAST:  ISOVUE-300 IOPAMIDOL (ISOVUE-300) INJECTION 61% IV. No oral contrast. COMPARISON:  None. FINDINGS: Lower chest: Dependent atelectasis at lung bases. Question atelectasis versus 8 mm nodule at medial RIGHT middle lobe image 3. Hepatobiliary: 7 mm nonspecific intermediate attenuation focus superiorly at dome of liver image 8. No additional focal abnormalities of the liver or gallbladder. Pancreas: Normal appearance Spleen: Normal appearance Adrenals/Urinary Tract: Adrenal glands, RIGHT kidney, RIGHT ureter, and bladder normal appearance. LEFT hydronephrosis with delay in LEFT nephrogram, LEFT perinephric edema, and proximal LEFT ureteral dilatation secondary to a 7 x 8 x 13 mm proximal LEFT ureteral calculus. Distal LEFT ureter decompressed. Stomach/Bowel: Normal  appearance Vascular/Lymphatic:  Atherosclerotic calcification aorta and iliac arteries without aneurysm. No adenopathy. Reproductive: Mild prostatic enlargement, gland 5.4 x 3.7 cm image 81. Seminal vesicles unremarkable. Other: No free air free fluid.  No hernia. Musculoskeletal: Prior lumbar fusion L3-L4 with failure of incorporation of the disc prosthesis and associated irregular vertebral endplates. Bones demineralized. IMPRESSION: LEFT hydronephrosis and proximal LEFT hydroureter with associated impairment of LEFT nephrogram secondary to an obstructing 7 x 8 x 13 mm proximal LEFT ureteral calculus. Nonspecific 7 mm intermediate attenuation lesion within the liver ; recommend follow-up MR imaging in 6 months to assess stability and characterize. Prostatic enlargement. Question 8 mm RIGHT middle lobe nodule, recommendation below. Non-contrast chest CT at 6-12 months is recommended. If the nodule is stable at time of repeat CT, then future CT at 18-24 months (from today's scan) is considered optional for low-risk patients, but is recommended for high-risk patients. This recommendation follows the consensus statement: Guidelines for Management of Incidental Pulmonary Nodules Detected on CT Images: From the Fleischner Society 2017; Radiology 2017; 284:228-243. Aortic Atherosclerosis (ICD10-I70.0). Electronically Signed   By: Ulyses Southward M.D.   On: 03/18/2017 13:48   US Carotid Bilateral (at [facility] And Ap Only)  Result Date: 03/20/2017 CLINICAL DATA:  Right-sided weakness, right-sided facial droop acute cerebral infarction. EXAM: BILATERAL CAROTID DUPLEX ULTRASOUND TECHNIQUE: Wallace Cullens scale imaging, color Doppler and duplex ultrasound were performed of bilateral carotid and vertebral arteries in the neck. COMPARISON:  None. FINDINGS: Criteria: Quantification of carotid stenosis is based on velocity parameters that correlate the residual internal carotid diameter with NASCET-based stenosis levels, using the diameter of the distal internal carotid  lumen as the denominator for stenosis measurement. The following velocity measurements were obtained: RIGHT ICA:  114/38 cm/sec CCA:  138/34 cm/sec SYSTOLIC ICA/CCA RATIO:  0.8 DIASTOLIC ICA/CCA RATIO:  1.1 ECA:  159 cm/sec LEFT ICA:  114/41 cm/sec CCA:  113/28 cm/sec SYSTOLIC ICA/CCA RATIO:  1.0 DIASTOLIC ICA/CCA RATIO:  1.5 ECA:  151 cm/sec RIGHT CAROTID ARTERY: Intimal thickening present without focal plaque. There is no evidence of right ICA stenosis. RIGHT VERTEBRAL ARTERY: Antegrade flow with normal waveform and velocity. LEFT CAROTID ARTERY: Small amount of calcified plaque is present at the level of the left carotid bulb. No evidence of ICA plaque or stenosis. LEFT VERTEBRAL ARTERY: Antegrade flow with normal waveform and velocity. IMPRESSION: Small amount of plaque at the level of the left carotid bulb. No evidence of plaque in either internal carotid artery or evidence of ICA stenosis bilaterally. Electronically Signed   By: Irish Lack M.D.   On: 03/20/2017 18:29    Microbiology: No results found for this or any previous visit (from the past 240 hour(s)).   Labs: Basic Metabolic Panel:  Recent Labs Lab 03/18/17 1128 03/20/17 0852  NA 142 140  K 3.3* 4.0  CL 105 103  CO2 24 27  GLUCOSE 131* 101*  BUN 21* 24*  CREATININE 1.23 1.08  CALCIUM 9.4 9.4   Liver Function Tests:  Recent Labs Lab 03/18/17 1128 03/20/17 0852  AST 25 21  ALT 30 24  ALKPHOS 88 79  BILITOT 0.8 0.9  PROT 7.5 7.7  ALBUMIN 4.4 4.4    Recent Labs Lab 03/18/17 1128  LIPASE 20   No results for input(s): AMMONIA in the last 168 hours. CBC:  Recent Labs Lab 03/18/17 1128 03/20/17 0852  WBC 19.8* 12.5*  NEUTROABS  --  9.0*  HGB 15.6 15.2  HCT 45.2 45.8  MCV 89.9 92.0  PLT 371 297   Cardiac Enzymes: No results for input(s): CKTOTAL, CKMB, CKMBINDEX, TROPONINI in the last 168 hours. BNP: BNP (last 3 results) No results for input(s): BNP in the last 8760 hours.  ProBNP (last 3  results) No results for input(s): PROBNP in the last 8760 hours.  CBG: No results for input(s): GLUCAP in the last 168 hours.     SignedRhetta Mura MD   [facility] 03/21/2017, 12:41 PM

## 2017-03-21 NOTE — Progress Notes (Signed)
Result letter mailed to pt since he is in the hospital at this time.

## 2017-03-21 NOTE — Progress Notes (Signed)
Reminder in epic °

## 2017-03-21 NOTE — Care Management Note (Addendum)
Case Management Note  Patient Details  Name: Brendan Charles MRN: [id number] Date of Birth: Jul 14, 1956  Subjective/Objective:                  Acute CVA, pt from home with son. He is ind at baseline. He needs HH services and RW. Pt has no preference of HH or DME provider. Aware HH has 48 hrs to make first visit. Pt communicates no further needs or concerns.   Action/Plan: DC home today with HH services. [facility]inda, [facility] aware of [facility] and will obtain pt info from chart and deliver RW to pts room prior to DC. [facility] at DC.   Expected Discharge Date:      03/21/2017            Expected Discharge Plan:  Home w Home Health Services  In-House Referral:  NA  Discharge planning Services  CM Consult  Post Acute Care Choice:  Durable Medical Equipment, Home Health Choice offered to:  Patient  DME Arranged:  Walker rolling DME Agency:  [facility]  HH Arranged:  PT , RN, OT, SLP. HH Agency:  [facility]  Status of Service:  Completed, signed off  Malcolm MetroChildress, Shane Melby Demske, RN 03/21/2017, 12:06 PM

## 2017-03-21 NOTE — Evaluation (Signed)
Occupational Therapy Evaluation Patient Details Name: Brendan Charles MRN: [id number] DOB: March 18, 1956 Today's Date: 03/21/2017    History of Present Illness Brendan Charles is a 61yo white male [facility] d/t continued Rt sided weakness and multiple falls. Patient underwent colonoscopy 03/14/2017. He reports thereafter that day he had right upper and right lower extremity weakness. He apparently did not mention this to anyone. He is right-hand dominant. Over the course the next several days he had several falls and difficulty walking because of weakness of his leg. No apparent aggravating or alleviating factors. He had difficulty eating and so had to use his left hand side of his right hand. He's also had difficulty with speaking. Because of persistent symptoms he came to the emergency department for further evaluation. MRI positive for Acute nonhemorrhagic infarct left basal ganglia extending to the corona radiata   Clinical Impression   Pt seen early this am, agreeable to OT evaluation. During evaluation, pt able to use LUE for ADL completion, however unable to use RUE to assist due to weakness and coordination deficits. During functional mobility, pt requiring min guard with RW, verbal cuing for sequencing and safety. Recommend HHOT services on discharge with transition to OPOT when able to secure transportation. Pt's son available to assist with ADLs independently.     Follow Up Recommendations  Home health OT    Equipment Recommendations  None recommended by OT       Precautions / Restrictions Precautions Precautions: Fall Precaution Comments: Right foot drop Restrictions Weight Bearing Restrictions: No      Mobility Bed Mobility Overal bed mobility: Modified Independent             General bed mobility comments: truncal weakness, increased effort/time adn use of LUE to perform   Transfers Overall transfer level: Needs assistance Equipment used: Rolling walker  (2 wheeled) Transfers: Sit to/from Stand Sit to Stand: Min guard         General transfer comment: comes to standing s/ AD but falls forward immediately, requires modA to prevent contact with floor.         ADL either performed or assessed with clinical judgement   ADL Overall ADL's : Needs assistance/impaired Eating/Feeding: Set up;Bed level Eating/Feeding Details (indicate cue type and reason): Pt using non-dominant left hand for eating due to right weakness and poor coordination                 Lower Body Dressing: Supervision/safety;Sitting/lateral leans Lower Body Dressing Details (indicate cue type and reason): Pt using left hand for dressing tasks, unable to use RUE             Functional mobility during ADLs: Min guard;Rolling walker                    Pertinent Vitals/Pain Pain Assessment: No/denies pain     Hand Dominance Right   Extremity/Trunk Assessment Upper Extremity Assessment Upper Extremity Assessment: Generalized weakness;RUE deficits/detail RUE Deficits / Details: Right grip is decreeased 25-50%, pt unable to bring hand to top of head d/t weakness. unable to hold in full flexion when brought there passively; RUE is appropriate for RW use,.  RUE Sensation:  (sensation intact) RUE Coordination: decreased gross motor;decreased fine motor (digistal opposition is slow and labored)   Lower Extremity Assessment Lower Extremity Assessment: Generalized weakness;RLE deficits/detail RLE Deficits / Details: SLR 3+/5, ankle DF is 3-/5; Seated hip flexion is 3-/5  RLE Sensation:  (sensation intact )  Cervical / Trunk Assessment Cervical / Trunk Assessment:  (truncal weakness, increased UE use to come to sitting)   Communication Communication Communication: No difficulties   Cognition Arousal/Alertness: Awake/alert Behavior During Therapy: WFL for tasks assessed/performed Overall Cognitive Status: Within Functional Limits for tasks assessed                                                 Home Living Family/patient expects to be discharged to:: Private residence Living Arrangements: Children (Son ) Available Help at Discharge: Family;Available 24 hours/day Type of Home: House Home Access: Stairs to enter [employer] of Steps: 10 Entrance Stairs-Rails: Right;Left;Can reach both Home Layout: Two level;Able to live on main level with bedroom/bathroom;Bed/bath upstairs Alternate Level Stairs-Number of Steps: full flight  Alternate Level Stairs-Rails: Right Bathroom Shower/Tub: Producer, television/film/video: Standard     Home Equipment: Environmental consultant - standard      Lives With: Son    Prior Functioning/Environment Level of Independence: Independent                 OT Problem List: Decreased strength;Decreased activity tolerance;Impaired balance (sitting and/or standing);Decreased safety awareness;Decreased knowledge of use of DME or AE;Decreased coordination;Impaired UE functional use         OT Goals(Current goals can be found in the care plan section) Acute Rehab OT Goals Patient Stated Goal: regain full strength and independence  OT Frequency:      End of Session Equipment Utilized During Treatment: Gait belt;Rolling walker  Activity Tolerance: Patient tolerated treatment well Patient left: in bed;with call bell/phone within reach  OT Visit Diagnosis: Muscle weakness (generalized) (M62.81);Hemiplegia and hemiparesis Hemiplegia - Right/Left: Right Hemiplegia - dominant/non-dominant: Dominant Hemiplegia - caused by: Cerebral infarction                Time: 1610-9604 OT Time Calculation (min): 27 min Charges:  OT General Charges $OT Visit: 1 Procedure OT Evaluation $OT Eval Low Complexity: 1 Procedure    Ezra Sites, OTR/L  [phone] 03/21/2017, 1:21 PM

## 2017-03-21 NOTE — Evaluation (Signed)
Physical Therapy Evaluation Patient Details Name: Brendan Charles MRN: [id number] DOB: 02-Apr-1956 Today's Date: 03/21/2017   History of Present Illness  Brendan Charles is a 60yo white male who comes to [facility] on 8/13 d/t continued Rt sided weakness and multiple falls. Patient underwent colonoscopy 03/14/2017. He reports thereafter that day he had right upper and right lower extremity weakness. He apparently did not mention this to anyone. He is right-hand dominant. Over the course the next several days he had several falls and difficulty walking because of weakness of his leg. No apparent aggravating or alleviating factors. He had difficulty eating and so had to use his left hand side of his right hand. He's also had difficulty with speaking. Because of persistent symptoms he came to the emergency department for further evaluation. MRI positive for Acute nonhemorrhagic infarct left basal ganglia extending to the corona radiata  Clinical Impression  Pt admitted with above diagnosis. Pt currently with functional limitations due to the deficits listed below (see "PT Problem List"). Pt with moderate-heavy Right sided weakness, the RLE more weak distally with foot drop limiting energy efficiency and safety of gait. RUE weakness and fine motor coordination limiting independence with ADL and mobility with AD. Sensation is intact. Please read full note below for greater detail. Pt will benefit from skilled PT intervention to increase independence and safety with basic mobility in preparation for discharge to the venue listed below.       Follow Up Recommendations Home health PT (should transition to OPPT as soon as safe community access is possible to have a higher frequency of services. )    Equipment Recommendations  Rolling walker with 5" wheels (Rt ankle foot orthosis (AFO); will need to be coordianted with Location manager Clinic in [place])    Recommendations for Other Services OT consult      Precautions / Restrictions Precautions Precautions: Fall Precaution Comments: Right foot drop Restrictions Weight Bearing Restrictions: No      Mobility  Bed Mobility Overal bed mobility: Modified Independent             General bed mobility comments: truncal weakness, increased effort/time adn use of LUE to perform   Transfers Overall transfer level: Needs assistance Equipment used: Rolling walker (2 wheeled) Transfers: Sit to/from Stand           General transfer comment: comes to standing s/ AD but falls forward immediately, requires modA to prevent contact with floor.   Ambulation/Gait Ambulation/Gait assistance: Min assist;Mod assist Ambulation Distance (Feet): 200 Feet Assistive device: Rolling walker (2 wheeled)     Gait velocity interpretation: <1.8 ft/sec, indicative of risk for recurrent falls General Gait Details: very unsteady and somewaht frantic, out of control; VC for safe RW use; Rt foot drop is most limiting and dangerous.   Stairs            Wheelchair Mobility    Modified Rankin (Stroke Patients Only)       Balance Overall balance assessment: Needs assistance (too impaired to formally screen, balanced in sitting only. ) Sitting-balance support: Feet supported Sitting balance-Leahy Scale: Good     Standing balance support: Bilateral upper extremity supported;During functional activity Standing balance-Leahy Scale: Poor                               Pertinent Vitals/Pain Pain Assessment: No/denies pain    Home Living Family/patient expects to be discharged to:: Private residence Living  Arrangements: Children (Son ) Available Help at Discharge: Family;Available 24 hours/day Type of Home: House Home Access: Stairs to enter Entrance Stairs-Rails: Right;Left;Can reach both Entrance Stairs-Number of Steps: 10 Home Layout: Two level;Able to live on main level with bedroom/bathroom;Bed/bath upstairs Home Equipment:  Walker - standard      Prior Function Level of Independence: Independent               Hand Dominance   Dominant Hand: Right    Extremity/Trunk Assessment   Upper Extremity Assessment Upper Extremity Assessment: Generalized weakness;RUE deficits/detail RUE Deficits / Details: Right grip is decreeased 25-50%, pt unable to bring hand to top of head d/t weakness. unable to hold in full flexion when brought there passively; RUE is appropriate for RW use,.  RUE Sensation:  (sensation intact) RUE Coordination: decreased gross motor;decreased fine motor (digistal opposition is slow and labored)    Lower Extremity Assessment Lower Extremity Assessment: Generalized weakness;RLE deficits/detail RLE Deficits / Details: SLR 3+/5, ankle DF is 3-/5; Seated hip flexion is 3-/5  RLE Sensation:  (sensation intact )    Cervical / Trunk Assessment Cervical / Trunk Assessment:  (truncal weakness, increased UE use to come to sitting)  Communication   Communication: No difficulties  Cognition Arousal/Alertness: Awake/alert Behavior During Therapy: WFL for tasks assessed/performed Overall Cognitive Status: Within Functional Limits for tasks assessed                                        General Comments      Exercises     Assessment/Plan    PT Assessment Patient needs continued PT services  PT Problem List Decreased strength;Decreased range of motion;Decreased balance;Decreased mobility;Decreased coordination       PT Treatment Interventions DME instruction    PT Goals (Current goals can be found in the Care Plan section)  Acute Rehab PT Goals Patient Stated Goal: regain full strength and independence PT Goal Formulation: With patient Time For Goal Achievement: 04/04/17 Potential to Achieve Goals: Fair    Frequency 7X/week   Barriers to discharge        Co-evaluation               AM-PAC PT "6 Clicks" Daily Activity  Outcome Measure Difficulty  turning over in bed (including adjusting bedclothes, sheets and blankets)?: A Lot Difficulty moving from lying on back to sitting on the side of the bed? : A Lot Difficulty sitting down on and standing up from a chair with arms (e.g., wheelchair, bedside commode, etc,.)?: Total Help needed moving to and from a bed to chair (including a wheelchair)?: A Little Help needed walking in hospital room?: A Little Help needed climbing 3-5 steps with a railing? : A Lot 6 Click Score: 13    End of Session Equipment Utilized During Treatment: Gait belt Activity Tolerance: Patient tolerated treatment well;Patient limited by fatigue Patient left: in bed;with call bell/phone within reach Nurse Communication: Mobility status PT Visit Diagnosis: Unsteadiness on feet (R26.81);Difficulty in walking, not elsewhere classified (R26.2);Hemiplegia and hemiparesis;Ataxic gait (R26.0) Hemiplegia - Right/Left: Right Hemiplegia - dominant/non-dominant: Dominant Hemiplegia - caused by: Cerebral infarction    Time: [id number] PT Time Calculation (min) (ACUTE ONLY): 20 min   Charges:   PT Evaluation $PT Eval Low Complexity: 1 Low PT Treatments $Therapeutic Activity: 8-22 mins   PT G Codes:        11:55 AM, 03/21/17  Rosamaria Lints, PT, DPT Physical Therapist - [place] [phone] (Office)    Labella Zahradnik C 03/21/2017, 11:53 AM

## 2017-03-22 DIAGNOSIS — N201 Calculus of ureter: Secondary | ICD-10-CM | POA: Diagnosis not present

## 2017-03-22 DIAGNOSIS — Z7902 Long term (current) use of antithrombotics/antiplatelets: Secondary | ICD-10-CM | POA: Diagnosis not present

## 2017-03-22 DIAGNOSIS — I2510 Atherosclerotic heart disease of native coronary artery without angina pectoris: Secondary | ICD-10-CM | POA: Diagnosis not present

## 2017-03-22 DIAGNOSIS — I69351 Hemiplegia and hemiparesis following cerebral infarction affecting right dominant side: Secondary | ICD-10-CM | POA: Diagnosis not present

## 2017-03-22 DIAGNOSIS — Z7982 Long term (current) use of aspirin: Secondary | ICD-10-CM | POA: Diagnosis not present

## 2017-03-22 DIAGNOSIS — N202 Calculus of kidney with calculus of ureter: Secondary | ICD-10-CM | POA: Diagnosis not present

## 2017-03-22 DIAGNOSIS — I719 Aortic aneurysm of unspecified site, without rupture: Secondary | ICD-10-CM | POA: Diagnosis not present

## 2017-03-22 DIAGNOSIS — Z79891 Long term (current) use of opiate analgesic: Secondary | ICD-10-CM | POA: Diagnosis not present

## 2017-03-22 DIAGNOSIS — M549 Dorsalgia, unspecified: Secondary | ICD-10-CM | POA: Diagnosis not present

## 2017-03-22 LAB — HEMOGLOBIN A1C
HEMOGLOBIN A1C: 5.5 % (ref 4.8–5.6)
Mean Plasma Glucose: 111 mg/dL

## 2017-03-22 LAB — HIV ANTIBODY (ROUTINE TESTING W REFLEX): HIV SCREEN 4TH GENERATION: NONREACTIVE

## 2017-03-22 LAB — RPR: RPR: NONREACTIVE

## 2017-03-23 ENCOUNTER — Ambulatory Visit (HOSPITAL_COMMUNITY): Payer: Medicare Other

## 2017-03-23 ENCOUNTER — Other Ambulatory Visit: Payer: Self-pay | Admitting: Urology

## 2017-03-23 ENCOUNTER — Ambulatory Visit (HOSPITAL_COMMUNITY): Payer: Medicare Other | Admitting: Certified Registered Nurse Anesthetist

## 2017-03-23 ENCOUNTER — Encounter (HOSPITAL_COMMUNITY)
Admission: RE | Disposition: A | Discharge status: Home / Self Care | Payer: Self-pay | Source: Ambulatory Visit | Attending: Urology

## 2017-03-23 ENCOUNTER — Encounter (HOSPITAL_COMMUNITY): Payer: Self-pay | Admitting: *Deleted

## 2017-03-23 ENCOUNTER — Ambulatory Visit (HOSPITAL_COMMUNITY)
Admission: RE | Admit: 2017-03-23 | Discharge: 2017-03-23 | Disposition: A | Discharge status: Home / Self Care | Payer: Medicare Other | Source: Ambulatory Visit | Attending: Urology | Admitting: Urology

## 2017-03-23 DIAGNOSIS — Z8673 Personal history of transient ischemic attack (TIA), and cerebral infarction without residual deficits: Secondary | ICD-10-CM | POA: Insufficient documentation

## 2017-03-23 DIAGNOSIS — N132 Hydronephrosis with renal and ureteral calculous obstruction: Secondary | ICD-10-CM | POA: Insufficient documentation

## 2017-03-23 DIAGNOSIS — Z7902 Long term (current) use of antithrombotics/antiplatelets: Secondary | ICD-10-CM | POA: Diagnosis not present

## 2017-03-23 DIAGNOSIS — F17210 Nicotine dependence, cigarettes, uncomplicated: Secondary | ICD-10-CM | POA: Insufficient documentation

## 2017-03-23 DIAGNOSIS — Z79899 Other long term (current) drug therapy: Secondary | ICD-10-CM | POA: Diagnosis not present

## 2017-03-23 DIAGNOSIS — F419 Anxiety disorder, unspecified: Secondary | ICD-10-CM | POA: Diagnosis not present

## 2017-03-23 DIAGNOSIS — Z7982 Long term (current) use of aspirin: Secondary | ICD-10-CM | POA: Insufficient documentation

## 2017-03-23 DIAGNOSIS — I739 Peripheral vascular disease, unspecified: Secondary | ICD-10-CM | POA: Insufficient documentation

## 2017-03-23 DIAGNOSIS — M549 Dorsalgia, unspecified: Secondary | ICD-10-CM | POA: Insufficient documentation

## 2017-03-23 DIAGNOSIS — G8929 Other chronic pain: Secondary | ICD-10-CM | POA: Diagnosis not present

## 2017-03-23 DIAGNOSIS — N1330 Unspecified hydronephrosis: Secondary | ICD-10-CM | POA: Diagnosis not present

## 2017-03-23 DIAGNOSIS — N201 Calculus of ureter: Secondary | ICD-10-CM | POA: Diagnosis not present

## 2017-03-23 DIAGNOSIS — I639 Cerebral infarction, unspecified: Secondary | ICD-10-CM | POA: Diagnosis not present

## 2017-03-23 DIAGNOSIS — M1990 Unspecified osteoarthritis, unspecified site: Secondary | ICD-10-CM | POA: Insufficient documentation

## 2017-03-23 DIAGNOSIS — B1920 Unspecified viral hepatitis C without hepatic coma: Secondary | ICD-10-CM | POA: Diagnosis not present

## 2017-03-23 DIAGNOSIS — M4316 Spondylolisthesis, lumbar region: Secondary | ICD-10-CM | POA: Diagnosis not present

## 2017-03-23 HISTORY — PX: CYSTOSCOPY W/ URETERAL STENT PLACEMENT: SHX1429

## 2017-03-23 SURGERY — CYSTOSCOPY, WITH RETROGRADE PYELOGRAM AND URETERAL STENT INSERTION
Anesthesia: General | Laterality: Left

## 2017-03-23 MED ORDER — FENTANYL CITRATE (PF) 100 MCG/2ML IJ SOLN: 25.0000 ug | INTRAMUSCULAR | Status: DC | PRN

## 2017-03-23 MED ORDER — SODIUM CHLORIDE 0.9 % IR SOLN
Status: DC | PRN
  Administered 2017-03-23: 3000 mL via INTRAVESICAL

## 2017-03-23 MED ORDER — MEPERIDINE HCL 50 MG/ML IJ SOLN: 6.2500 mg | INTRAMUSCULAR | Status: DC | PRN

## 2017-03-23 MED ORDER — IOHEXOL 300 MG/ML  SOLN
INTRAMUSCULAR | Status: DC | PRN
  Administered 2017-03-23: 10 mL

## 2017-03-23 MED ORDER — PROPOFOL 10 MG/ML IV BOLUS
INTRAVENOUS | Status: DC | PRN
  Administered 2017-03-23: 200 mg via INTRAVENOUS

## 2017-03-23 MED ORDER — DEXAMETHASONE SODIUM PHOSPHATE 4 MG/ML IJ SOLN
INTRAMUSCULAR | Status: DC | PRN
Start: 2017-03-23 — End: 2017-03-23
  Administered 2017-03-23: 10 mg via INTRAVENOUS

## 2017-03-23 MED ORDER — LIDOCAINE HCL (CARDIAC) 20 MG/ML IV SOLN
INTRAVENOUS | Status: DC | PRN
  Administered 2017-03-23: 50 mg via INTRAVENOUS

## 2017-03-23 MED ORDER — TAMSULOSIN HCL 0.4 MG PO CAPS: 0.4000 mg | ORAL_CAPSULE | Freq: Every day | ORAL | 0 refills | Status: DC

## 2017-03-23 MED ORDER — PHENAZOPYRIDINE HCL 100 MG PO TABS: 100.0000 mg | ORAL_TABLET | Freq: Three times a day (TID) | ORAL | 0 refills | Status: DC | PRN

## 2017-03-23 MED ORDER — ONDANSETRON HCL 4 MG/2ML IJ SOLN
INTRAMUSCULAR | Status: DC | PRN
  Administered 2017-03-23: 4 mg via INTRAVENOUS

## 2017-03-23 MED ORDER — PROMETHAZINE HCL 25 MG/ML IJ SOLN: 6.2500 mg | INTRAMUSCULAR | Status: DC | PRN

## 2017-03-23 MED ORDER — FENTANYL CITRATE (PF) 100 MCG/2ML IJ SOLN
INTRAMUSCULAR | Status: AC
  Filled 2017-03-23: qty 2

## 2017-03-23 MED ORDER — LIDOCAINE 2% (20 MG/ML) 5 ML SYRINGE
INTRAMUSCULAR | Status: AC
  Filled 2017-03-23: qty 5

## 2017-03-23 MED ORDER — PHENYLEPHRINE 40 MCG/ML (10ML) SYRINGE FOR IV PUSH (FOR BLOOD PRESSURE SUPPORT)
PREFILLED_SYRINGE | INTRAVENOUS | Status: AC
Start: 2017-03-23 — End: 2017-03-23
  Filled 2017-03-23: qty 10

## 2017-03-23 MED ORDER — CEFAZOLIN SODIUM-DEXTROSE 2-4 GM/100ML-% IV SOLN
2.0000 g | INTRAVENOUS | Status: AC
  Administered 2017-03-23: 2 g via INTRAVENOUS
  Filled 2017-03-23: qty 100

## 2017-03-23 MED ORDER — LACTATED RINGERS IV SOLN
INTRAVENOUS | Status: DC
  Administered 2017-03-23: 12:00:00 via INTRAVENOUS

## 2017-03-23 MED ORDER — FENTANYL CITRATE (PF) 100 MCG/2ML IJ SOLN
INTRAMUSCULAR | Status: DC | PRN
  Administered 2017-03-23: 50 ug via INTRAVENOUS

## 2017-03-23 MED ORDER — ONDANSETRON HCL 4 MG/2ML IJ SOLN
INTRAMUSCULAR | Status: AC
  Filled 2017-03-23: qty 2

## 2017-03-23 MED ORDER — PHENYLEPHRINE HCL 10 MG/ML IJ SOLN
INTRAMUSCULAR | Status: DC | PRN
  Administered 2017-03-23: 40 ug via INTRAVENOUS
  Administered 2017-03-23: 80 ug via INTRAVENOUS

## 2017-03-23 MED ORDER — DEXAMETHASONE SODIUM PHOSPHATE 10 MG/ML IJ SOLN
INTRAMUSCULAR | Status: AC
  Filled 2017-03-23: qty 1

## 2017-03-23 MED ORDER — OXYCODONE-ACETAMINOPHEN 7.5-325 MG PO TABS: 1.0000 | ORAL_TABLET | Freq: Four times a day (QID) | ORAL | 0 refills | Status: DC | PRN

## 2017-03-23 SURGICAL SUPPLY — 24 items
BAG URO CATCHER STRL LF (MISCELLANEOUS) ×3 IMPLANT
BASKET DAKOTA 1.9FR 11X120 (BASKET) IMPLANT
BASKET LASER NITINOL 1.9FR (BASKET) IMPLANT
CATH FOLEY LATEX FREE 20FR (CATHETERS)
CATH FOLEY LF 20FR (CATHETERS) IMPLANT
CATH INTERMIT  6FR 70CM (CATHETERS) ×3 IMPLANT
CLOTH BEACON ORANGE TIMEOUT ST (SAFETY) ×3 IMPLANT
COVER SURGICAL LIGHT HANDLE (MISCELLANEOUS) ×3 IMPLANT
EXTRACTOR STONE NITINOL NGAGE (UROLOGICAL SUPPLIES) IMPLANT
FIBER LASER TRAC TIP (UROLOGICAL SUPPLIES) IMPLANT
GLOVE BIO SURGEON STRL SZ8 (GLOVE) ×3 IMPLANT
GOWN STRL REUS W/TWL XL LVL3 (GOWN DISPOSABLE) ×3 IMPLANT
GUIDEWIRE ANG ZIPWIRE 038X150 (WIRE) ×3 IMPLANT
GUIDEWIRE STR DUAL SENSOR (WIRE) ×3 IMPLANT
MANIFOLD NEPTUNE II (INSTRUMENTS) ×3 IMPLANT
PACK CYSTO (CUSTOM PROCEDURE TRAY) ×3 IMPLANT
SHEATH ACCESS URETERAL 38CM (SHEATH) IMPLANT
STENT CONTOUR 6FRX26X.038 (STENTS) IMPLANT
STENT URET 6FRX26 CONTOUR (STENTS) ×3 IMPLANT
SYR CONTROL 10ML LL (SYRINGE) IMPLANT
SYRINGE IRR TOOMEY STRL 70CC (SYRINGE) IMPLANT
TUBE FEEDING 8FR 16IN STR KANG (MISCELLANEOUS) IMPLANT
TUBING CONNECTING 10 (TUBING) ×2 IMPLANT
TUBING CONNECTING 10' (TUBING) ×1

## 2017-03-23 NOTE — Op Note (Signed)
.  Preoperative diagnosis: Left ureteral stone, intractable pain  Postoperative diagnosis: Same  Procedure: 1 cystoscopy 2. Left retrograde pyelography 3.  Intraoperative fluoroscopy, under one hour, with interpretation 4. Left 6 x 26 JJ stent placement  Attending: Wilkie AyePatrick Tymon Nemetz  Anesthesia: General  Estimated blood loss: None  Drains: Left 6 x 26 JJ ureteral stent without tether,  Specimens: none  Antibiotics: ancef  Findings: left proximal ureteral stone. Moderate hydronephrosis. No masses/lesions in the bladder. Ureteral orifices in normal anatomic location.  Indications: Patient is a 61 year old male with a history of left ureteral stone and intractable.  After discussing treatment options, they decided proceed with left stent placement.  Procedure her in detail: The patient was brought to the operating room and a brief timeout was done to ensure correct patient, correct procedure, correct site.  General anesthesia was administered patient was placed in dorsal lithotomy position.  Their genitalia was then prepped and draped in usual sterile fashion.  A rigid 22 French cystoscope was passed in the urethra and the bladder.  Bladder was inspected free masses or lesions.  the ureteral orifices were in the normal orthotopic locations.  a 6 french ureteral catheter was then instilled into the left ureteral orifice.  a gentle retrograde was obtained and findings noted above.  we then placed a zip wire through the ureteral catheter and advanced up to the renal pelvis.    We then placed a 6 x 26 double-j ureteral stent over the original zip wire.  We then removed the wire and good coil was noted in the the renal pelvis under fluoroscopy and the bladder under direct vision. the bladder was then drained and this concluded the procedure which was well tolerated by patient.  Complications: None  Condition: Stable, extubated, transferred to PACU  Plan: Patient is to be discharged home. He will  have his stone extraction in 4 weeks.

## 2017-03-23 NOTE — H&P (Signed)
Urology Admission H&P  Chief Complaint: left flank pain  History of Present Illness: [name] is a 61yo with a left ureteral stone and intractable left flank pain  Past Medical History:  Diagnosis Date  . Anxiety   . Chronic back pain   . CVA (cerebral vascular accident) (HCC) 03/20/2017  . Hepatitis C    HEP C, never treated, 2007   Past Surgical History:  Procedure Laterality Date  . CERVICAL DISC SURGERY  2001   anterior  . COLONOSCOPY WITH PROPOFOL N/A 03/14/2017   Procedure: COLONOSCOPY WITH PROPOFOL;  Surgeon: West BaliFields, Sandi L, MD;  Location: [facility];  Service: Endoscopy;  Laterality: N/A;  8:15am  . LUMBAR SPINE SURGERY  2015  . POLYPECTOMY  03/14/2017   Procedure: POLYPECTOMY;  Surgeon: West BaliFields, Sandi L, MD;  Location: [facility];  Service: Endoscopy;;  rectum    Home Medications:  Prescriptions Prior to Admission  Medication Sig Dispense Refill Last Dose  . ALPRAZolam (XANAX) 0.5 MG tablet Take 0.5 mg by mouth 3 (three) times daily as needed for anxiety.   Past Week at Unknown time  . aspirin 325 MG tablet Take 1 tablet (325 mg total) by mouth daily. 30 tablet 12 03/23/2017 at Unknown time  . atorvastatin (LIPITOR) 80 MG tablet Take 1 tablet (80 mg total) by mouth daily at 6 PM. 30 tablet 3 03/22/2017 at 0600  . clopidogrel (PLAVIX) 75 MG tablet Take 1 tablet (75 mg total) by mouth daily with breakfast. 30 tablet 3 03/23/2017 at Unknown time  . HYDROcodone-acetaminophen (NORCO) 10-325 MG tablet Take 1 tablet by mouth 4 (four) times daily.    03/23/2017 at Unknown time  . ondansetron (ZOFRAN ODT) 8 MG disintegrating tablet Take 1 tablet (8 mg total) by mouth every 8 (eight) hours as needed for nausea or vomiting. 20 tablet 0 03/22/2017 at Unknown time  . tamsulosin (FLOMAX) 0.4 MG CAPS capsule Take 1 capsule (0.4 mg total) by mouth daily. 15 capsule 0 03/23/2017 at Unknown time  . oxyCODONE-acetaminophen (PERCOCET) 7.5-325 MG tablet Take 1 tablet by mouth every 6 (six) hours  as needed for severe pain. (Patient not taking: Reported on 03/23/2017) 20 tablet 0 Not Taking at Unknown time   Allergies:  Allergies  Allergen Reactions  . Codeine Itching and Nausea Only    Family History  Problem Relation Age of Onset  . Cancer Mother   . Colon cancer Neg Hx    Social History:  reports that he has been smoking Cigarettes.  He has a 25.00 pack-year smoking history. He has never used smokeless tobacco. He reports that he does not drink alcohol or use drugs.  Review of Systems  Genitourinary: Positive for flank pain.  All other systems reviewed and are negative.   Physical Exam:  Vital signs in last 24 hours: Temp:  [97.8 F (36.6 C)] 97.8 F (36.6 C) (08/16 1115) Pulse Rate:  [66] 66 (08/16 1115) Resp:  [16] 16 (08/16 1115) BP: (173)/(99) 173/99 (08/16 1115) SpO2:  [100 %] 100 % (08/16 1115) Weight:  [67.1 kg (148 lb)] 67.1 kg (148 lb) (08/16 1115) Physical Exam  Constitutional: He is oriented to person, place, and time. He appears well-developed and well-nourished.  HENT:  Head: Normocephalic and atraumatic.  Eyes: Pupils are equal, round, and reactive to light. EOM are normal.  Neck: Normal range of motion. No thyromegaly present.  Cardiovascular: Normal rate and regular rhythm.   Respiratory: Effort normal. No respiratory distress.  GI: Soft.  He exhibits no distension.  Musculoskeletal: Normal range of motion. He exhibits no edema.  Neurological: He is alert and oriented to person, place, and time.  Skin: Skin is warm and dry.  Psychiatric: He has a normal mood and affect. His behavior is normal. Judgment and thought content normal.    Laboratory Data:  No results found for this or any previous visit (from the past 24 hour(s)). No results found for this or any previous visit (from the past 240 hour(s)). Creatinine:  Recent Labs  03/18/17 1128 03/20/17 0852  CREATININE 1.23 1.08   Baseline Creatinine: 1.1  Impression/Assessment:  61yo  with left ureteral stone and intractable pain  Plan:  The risks/benefits/alternatives to Left stent placement was explained to the patient and he understands and wishes to proceed with stent placement  Wilkie Aye 03/23/2017, 1:19 PM

## 2017-03-23 NOTE — Discharge Instructions (Signed)

## 2017-03-23 NOTE — Transfer of Care (Signed)
Immediate Anesthesia Transfer of Care Note  Patient: Susanne GreenhouseDouglas B Ayllon  Procedure(s) Performed: Procedure(s): CYSTOSCOPY WITH LEFT RETROGRADE PYELOGRAM/URETERAL LEFT STENT PLACEMENT (Left)  Patient Location: PACU  Anesthesia Type:General  Level of Consciousness:  sedated, patient cooperative and responds to stimulation  Airway & Oxygen Therapy:Patient Spontanous Breathing and Patient connected to face mask oxgen  Post-op Assessment:  Report given to PACU RN and Post -op Vital signs reviewed and stable  Post vital signs:  Reviewed and stable  Last Vitals:  Vitals:   03/23/17 1115  BP: (!) 173/99  Pulse: 66  Resp: 16  Temp: 36.6 C  SpO2: 100%    Complications: No apparent anesthesia complications

## 2017-03-23 NOTE — Anesthesia Procedure Notes (Signed)
Procedure Name: LMA Insertion Date/Time: 03/23/2017 1:27 PM Performed by: Vanessa DurhamOCHRAN, Brendan Castiglia GLENN Pre-anesthesia Checklist: Emergency Drugs available, Patient identified, Suction available and Patient being monitored Patient Re-evaluated:Patient Re-evaluated prior to induction Oxygen Delivery Method: Circle system utilized Preoxygenation: Pre-oxygenation with 100% oxygen Induction Type: IV induction Ventilation: Mask ventilation without difficulty LMA: LMA with gastric port inserted LMA Size: 4.0 Number of attempts: 1 Placement Confirmation: positive ETCO2 and breath sounds checked- equal and bilateral Tube secured with: Tape Dental Injury: Teeth and Oropharynx as per pre-operative assessment

## 2017-03-23 NOTE — Anesthesia Preprocedure Evaluation (Signed)
Anesthesia Evaluation  Patient identified by MRN, date of birth, ID band  Reviewed: Allergy & Precautions, H&P , NPO status , Patient's Chart, lab work & pertinent test results  Airway Mallampati: II   Neck ROM: Full    Dental  (+) Missing, Dental Advisory Given, Poor Dentition, Chipped   Pulmonary Current Smoker,  Current smoker, 25 pack year   breath sounds clear to auscultation       Cardiovascular + Peripheral Vascular Disease   Rhythm:Regular Rate:Normal     Neuro/Psych Anxiety XanaxCVA    GI/Hepatic (+) Hepatitis -  Endo/Other    Renal/GU Renal disease     Musculoskeletal  (+) Arthritis ,   Abdominal (+)  Abdomen: soft.    Peds  Hematology   Anesthesia Other Findings Chronic pain   Reproductive/Obstetrics                             Anesthesia Physical  Anesthesia Plan  ASA: III  Anesthesia Plan: General   Post-op Pain Management:    Induction: Intravenous  PONV Risk Score and Plan: 1 and Ondansetron and Treatment may vary due to age or medical condition  Airway Management Planned: LMA  Additional Equipment:   Intra-op Plan:   Post-operative Plan: Extubation in OR  Informed Consent: I have reviewed the patients History and Physical, chart, labs and discussed the procedure including the risks, benefits and alternatives for the proposed anesthesia with the patient or authorized representative who has indicated his/her understanding and acceptance.   Dental advisory given  Plan Discussed with: CRNA  Anesthesia Plan Comments:         Anesthesia Quick Evaluation

## 2017-03-24 DIAGNOSIS — I2510 Atherosclerotic heart disease of native coronary artery without angina pectoris: Secondary | ICD-10-CM | POA: Diagnosis not present

## 2017-03-24 DIAGNOSIS — Z7982 Long term (current) use of aspirin: Secondary | ICD-10-CM | POA: Diagnosis not present

## 2017-03-24 DIAGNOSIS — Z7902 Long term (current) use of antithrombotics/antiplatelets: Secondary | ICD-10-CM | POA: Diagnosis not present

## 2017-03-24 DIAGNOSIS — I719 Aortic aneurysm of unspecified site, without rupture: Secondary | ICD-10-CM | POA: Diagnosis not present

## 2017-03-24 DIAGNOSIS — M549 Dorsalgia, unspecified: Secondary | ICD-10-CM | POA: Diagnosis not present

## 2017-03-24 DIAGNOSIS — N201 Calculus of ureter: Secondary | ICD-10-CM | POA: Diagnosis not present

## 2017-03-24 DIAGNOSIS — I69351 Hemiplegia and hemiparesis following cerebral infarction affecting right dominant side: Secondary | ICD-10-CM | POA: Diagnosis not present

## 2017-03-24 DIAGNOSIS — Z79891 Long term (current) use of opiate analgesic: Secondary | ICD-10-CM | POA: Diagnosis not present

## 2017-03-24 NOTE — Anesthesia Postprocedure Evaluation (Signed)
Anesthesia Post Note  Patient: Brendan Charles  Procedure(s) Performed: Procedure(s) (LRB): CYSTOSCOPY WITH LEFT RETROGRADE PYELOGRAM/URETERAL LEFT STENT PLACEMENT (Left)     Patient location during evaluation: PACU Anesthesia Type: General Level of consciousness: sedated and patient cooperative Pain management: pain level controlled Vital Signs Assessment: post-procedure vital signs reviewed and stable Respiratory status: spontaneous breathing Cardiovascular status: stable Anesthetic complications: no    Last Vitals:  Vitals:   03/23/17 1500 03/23/17 1525  BP: (!) 170/90 (!) 171/90  Pulse: 72 89  Resp: 16 18  Temp: 36.4 C   SpO2: 100% 100%    Last Pain:  Vitals:   03/23/17 1525  TempSrc:   PainSc: 0-No pain   Pain Goal: Patients Stated Pain Goal: 5 (03/23/17 1302)               Lewie Loron

## 2017-03-27 DIAGNOSIS — Z7902 Long term (current) use of antithrombotics/antiplatelets: Secondary | ICD-10-CM | POA: Diagnosis not present

## 2017-03-27 DIAGNOSIS — M549 Dorsalgia, unspecified: Secondary | ICD-10-CM | POA: Diagnosis not present

## 2017-03-27 DIAGNOSIS — I719 Aortic aneurysm of unspecified site, without rupture: Secondary | ICD-10-CM | POA: Diagnosis not present

## 2017-03-27 DIAGNOSIS — Z79891 Long term (current) use of opiate analgesic: Secondary | ICD-10-CM | POA: Diagnosis not present

## 2017-03-27 DIAGNOSIS — I2510 Atherosclerotic heart disease of native coronary artery without angina pectoris: Secondary | ICD-10-CM | POA: Diagnosis not present

## 2017-03-27 DIAGNOSIS — Z7982 Long term (current) use of aspirin: Secondary | ICD-10-CM | POA: Diagnosis not present

## 2017-03-27 DIAGNOSIS — N201 Calculus of ureter: Secondary | ICD-10-CM | POA: Diagnosis not present

## 2017-03-27 DIAGNOSIS — I69351 Hemiplegia and hemiparesis following cerebral infarction affecting right dominant side: Secondary | ICD-10-CM | POA: Diagnosis not present

## 2017-03-28 DIAGNOSIS — N201 Calculus of ureter: Secondary | ICD-10-CM | POA: Diagnosis not present

## 2017-03-28 DIAGNOSIS — Z7902 Long term (current) use of antithrombotics/antiplatelets: Secondary | ICD-10-CM | POA: Diagnosis not present

## 2017-03-28 DIAGNOSIS — I69351 Hemiplegia and hemiparesis following cerebral infarction affecting right dominant side: Secondary | ICD-10-CM | POA: Diagnosis not present

## 2017-03-28 DIAGNOSIS — I2510 Atherosclerotic heart disease of native coronary artery without angina pectoris: Secondary | ICD-10-CM | POA: Diagnosis not present

## 2017-03-28 DIAGNOSIS — Z7982 Long term (current) use of aspirin: Secondary | ICD-10-CM | POA: Diagnosis not present

## 2017-03-28 DIAGNOSIS — Z79891 Long term (current) use of opiate analgesic: Secondary | ICD-10-CM | POA: Diagnosis not present

## 2017-03-28 DIAGNOSIS — I719 Aortic aneurysm of unspecified site, without rupture: Secondary | ICD-10-CM | POA: Diagnosis not present

## 2017-03-28 DIAGNOSIS — M549 Dorsalgia, unspecified: Secondary | ICD-10-CM | POA: Diagnosis not present

## 2017-03-29 ENCOUNTER — Other Ambulatory Visit: Payer: Self-pay | Admitting: *Deleted

## 2017-03-29 DIAGNOSIS — M549 Dorsalgia, unspecified: Secondary | ICD-10-CM | POA: Diagnosis not present

## 2017-03-29 DIAGNOSIS — N201 Calculus of ureter: Secondary | ICD-10-CM | POA: Diagnosis not present

## 2017-03-29 DIAGNOSIS — Z7902 Long term (current) use of antithrombotics/antiplatelets: Secondary | ICD-10-CM | POA: Diagnosis not present

## 2017-03-29 DIAGNOSIS — I2510 Atherosclerotic heart disease of native coronary artery without angina pectoris: Secondary | ICD-10-CM | POA: Diagnosis not present

## 2017-03-29 DIAGNOSIS — Z7982 Long term (current) use of aspirin: Secondary | ICD-10-CM | POA: Diagnosis not present

## 2017-03-29 DIAGNOSIS — I69351 Hemiplegia and hemiparesis following cerebral infarction affecting right dominant side: Secondary | ICD-10-CM | POA: Diagnosis not present

## 2017-03-29 DIAGNOSIS — I719 Aortic aneurysm of unspecified site, without rupture: Secondary | ICD-10-CM | POA: Diagnosis not present

## 2017-03-29 DIAGNOSIS — Z79891 Long term (current) use of opiate analgesic: Secondary | ICD-10-CM | POA: Diagnosis not present

## 2017-03-29 NOTE — Patient Outreach (Signed)
[facility]) Care Management  03/29/2017  Brendan Charles [birth year] [id number]  EMMI-Stroke -Red Alert-Day#6, 03/28/2017;  Reason-Smoked or been around smoke-yes  Telephone call to patient who was advised of reason for call.  HIPPA verification received from patient.   Patient voices that he had been around smoke but that he has not smoked in several days. States he is trying to stop smoking. Voices he has smoking cessation information & does not need any further form of assistance to help him with stopping.   States he is currently using walker to get around. States he has fallen since hospital discharge & sustained an injury above his eye.  Advised importance of seeing MD to check injury. States he has home health services in place.  Patient voices he manages his own medication & is taking as prescribed.  Voices he knows signs of stroke. Advised patient of calling 911 if symptoms occur.  Patient voices that his has primary care provider; has not had hospital follow up with PCP.  Patient advised of importance of making appointment for hospital follow up. Patient voices understanding.  States he has transportation as needed. States he & son live together.   Plan:  Consult with pt's home health services. Close out this EMMI call.   Telephone call to [facility]; left message requesting return call.   Received return call from [facility].  She advised that patient currently has therapy services.  This case manager advised that patient needed coaching on safety at home, and coaching on making appointments for follow up with primary care & specialist, such as neurologist. [name] states she will speak with physical therapy regarding patient care plan & request home health RN services if needed.   Colleen Can, RN BSN CCM Care Management Coordinator [facility] Care Management  [phone]

## 2017-03-31 DIAGNOSIS — Z7982 Long term (current) use of aspirin: Secondary | ICD-10-CM | POA: Diagnosis not present

## 2017-03-31 DIAGNOSIS — I719 Aortic aneurysm of unspecified site, without rupture: Secondary | ICD-10-CM | POA: Diagnosis not present

## 2017-03-31 DIAGNOSIS — I69351 Hemiplegia and hemiparesis following cerebral infarction affecting right dominant side: Secondary | ICD-10-CM | POA: Diagnosis not present

## 2017-03-31 DIAGNOSIS — M549 Dorsalgia, unspecified: Secondary | ICD-10-CM | POA: Diagnosis not present

## 2017-03-31 DIAGNOSIS — Z7902 Long term (current) use of antithrombotics/antiplatelets: Secondary | ICD-10-CM | POA: Diagnosis not present

## 2017-03-31 DIAGNOSIS — N201 Calculus of ureter: Secondary | ICD-10-CM | POA: Diagnosis not present

## 2017-03-31 DIAGNOSIS — Z79891 Long term (current) use of opiate analgesic: Secondary | ICD-10-CM | POA: Diagnosis not present

## 2017-03-31 DIAGNOSIS — I2510 Atherosclerotic heart disease of native coronary artery without angina pectoris: Secondary | ICD-10-CM | POA: Diagnosis not present

## 2017-04-04 DIAGNOSIS — Z79891 Long term (current) use of opiate analgesic: Secondary | ICD-10-CM | POA: Diagnosis not present

## 2017-04-04 DIAGNOSIS — M549 Dorsalgia, unspecified: Secondary | ICD-10-CM | POA: Diagnosis not present

## 2017-04-04 DIAGNOSIS — Z7982 Long term (current) use of aspirin: Secondary | ICD-10-CM | POA: Diagnosis not present

## 2017-04-04 DIAGNOSIS — I2510 Atherosclerotic heart disease of native coronary artery without angina pectoris: Secondary | ICD-10-CM | POA: Diagnosis not present

## 2017-04-04 DIAGNOSIS — Z7902 Long term (current) use of antithrombotics/antiplatelets: Secondary | ICD-10-CM | POA: Diagnosis not present

## 2017-04-04 DIAGNOSIS — I69351 Hemiplegia and hemiparesis following cerebral infarction affecting right dominant side: Secondary | ICD-10-CM | POA: Diagnosis not present

## 2017-04-04 DIAGNOSIS — I719 Aortic aneurysm of unspecified site, without rupture: Secondary | ICD-10-CM | POA: Diagnosis not present

## 2017-04-04 DIAGNOSIS — N201 Calculus of ureter: Secondary | ICD-10-CM | POA: Diagnosis not present

## 2017-04-06 DIAGNOSIS — Z7982 Long term (current) use of aspirin: Secondary | ICD-10-CM | POA: Diagnosis not present

## 2017-04-06 DIAGNOSIS — I719 Aortic aneurysm of unspecified site, without rupture: Secondary | ICD-10-CM | POA: Diagnosis not present

## 2017-04-06 DIAGNOSIS — I69351 Hemiplegia and hemiparesis following cerebral infarction affecting right dominant side: Secondary | ICD-10-CM | POA: Diagnosis not present

## 2017-04-06 DIAGNOSIS — Z7902 Long term (current) use of antithrombotics/antiplatelets: Secondary | ICD-10-CM | POA: Diagnosis not present

## 2017-04-06 DIAGNOSIS — Z79891 Long term (current) use of opiate analgesic: Secondary | ICD-10-CM | POA: Diagnosis not present

## 2017-04-06 DIAGNOSIS — N201 Calculus of ureter: Secondary | ICD-10-CM | POA: Diagnosis not present

## 2017-04-06 DIAGNOSIS — M549 Dorsalgia, unspecified: Secondary | ICD-10-CM | POA: Diagnosis not present

## 2017-04-06 DIAGNOSIS — I2510 Atherosclerotic heart disease of native coronary artery without angina pectoris: Secondary | ICD-10-CM | POA: Diagnosis not present

## 2017-04-07 ENCOUNTER — Other Ambulatory Visit: Payer: Self-pay | Admitting: Urology

## 2017-04-07 DIAGNOSIS — Z7982 Long term (current) use of aspirin: Secondary | ICD-10-CM | POA: Diagnosis not present

## 2017-04-07 DIAGNOSIS — I719 Aortic aneurysm of unspecified site, without rupture: Secondary | ICD-10-CM | POA: Diagnosis not present

## 2017-04-07 DIAGNOSIS — N201 Calculus of ureter: Secondary | ICD-10-CM | POA: Diagnosis not present

## 2017-04-07 DIAGNOSIS — I69351 Hemiplegia and hemiparesis following cerebral infarction affecting right dominant side: Secondary | ICD-10-CM | POA: Diagnosis not present

## 2017-04-07 DIAGNOSIS — Z79891 Long term (current) use of opiate analgesic: Secondary | ICD-10-CM | POA: Diagnosis not present

## 2017-04-07 DIAGNOSIS — M549 Dorsalgia, unspecified: Secondary | ICD-10-CM | POA: Diagnosis not present

## 2017-04-07 DIAGNOSIS — Z7902 Long term (current) use of antithrombotics/antiplatelets: Secondary | ICD-10-CM | POA: Diagnosis not present

## 2017-04-07 DIAGNOSIS — I2510 Atherosclerotic heart disease of native coronary artery without angina pectoris: Secondary | ICD-10-CM | POA: Diagnosis not present

## 2017-04-11 DIAGNOSIS — I69351 Hemiplegia and hemiparesis following cerebral infarction affecting right dominant side: Secondary | ICD-10-CM | POA: Diagnosis not present

## 2017-04-11 DIAGNOSIS — Z7982 Long term (current) use of aspirin: Secondary | ICD-10-CM | POA: Diagnosis not present

## 2017-04-11 DIAGNOSIS — N201 Calculus of ureter: Secondary | ICD-10-CM | POA: Diagnosis not present

## 2017-04-11 DIAGNOSIS — Z79891 Long term (current) use of opiate analgesic: Secondary | ICD-10-CM | POA: Diagnosis not present

## 2017-04-11 DIAGNOSIS — Z7902 Long term (current) use of antithrombotics/antiplatelets: Secondary | ICD-10-CM | POA: Diagnosis not present

## 2017-04-11 DIAGNOSIS — I719 Aortic aneurysm of unspecified site, without rupture: Secondary | ICD-10-CM | POA: Diagnosis not present

## 2017-04-11 DIAGNOSIS — I2510 Atherosclerotic heart disease of native coronary artery without angina pectoris: Secondary | ICD-10-CM | POA: Diagnosis not present

## 2017-04-11 DIAGNOSIS — M549 Dorsalgia, unspecified: Secondary | ICD-10-CM | POA: Diagnosis not present

## 2017-04-12 ENCOUNTER — Observation Stay (HOSPITAL_COMMUNITY): Payer: Medicare Other

## 2017-04-12 ENCOUNTER — Encounter (HOSPITAL_COMMUNITY): Payer: Self-pay | Admitting: Emergency Medicine

## 2017-04-12 ENCOUNTER — Emergency Department (HOSPITAL_COMMUNITY): Payer: Medicare Other

## 2017-04-12 ENCOUNTER — Inpatient Hospital Stay (HOSPITAL_COMMUNITY)
Admission: EM | Admit: 2017-04-12 | Discharge: 2017-04-13 | DRG: 065 | Disposition: A | Discharge status: Rehabilitation Facility | Payer: Medicare Other | Attending: Internal Medicine | Admitting: Internal Medicine

## 2017-04-12 DIAGNOSIS — F419 Anxiety disorder, unspecified: Secondary | ICD-10-CM | POA: Diagnosis present

## 2017-04-12 DIAGNOSIS — I639 Cerebral infarction, unspecified: Secondary | ICD-10-CM | POA: Diagnosis present

## 2017-04-12 DIAGNOSIS — I700 Atherosclerosis of aorta: Secondary | ICD-10-CM | POA: Diagnosis not present

## 2017-04-12 DIAGNOSIS — R531 Weakness: Secondary | ICD-10-CM

## 2017-04-12 DIAGNOSIS — R269 Unspecified abnormalities of gait and mobility: Secondary | ICD-10-CM | POA: Diagnosis not present

## 2017-04-12 DIAGNOSIS — I69322 Dysarthria following cerebral infarction: Secondary | ICD-10-CM | POA: Diagnosis not present

## 2017-04-12 DIAGNOSIS — I6300 Cerebral infarction due to thrombosis of unspecified precerebral artery: Secondary | ICD-10-CM

## 2017-04-12 DIAGNOSIS — B1920 Unspecified viral hepatitis C without hepatic coma: Secondary | ICD-10-CM | POA: Diagnosis present

## 2017-04-12 DIAGNOSIS — Z87891 Personal history of nicotine dependence: Secondary | ICD-10-CM

## 2017-04-12 DIAGNOSIS — M4316 Spondylolisthesis, lumbar region: Secondary | ICD-10-CM | POA: Diagnosis present

## 2017-04-12 DIAGNOSIS — I69354 Hemiplegia and hemiparesis following cerebral infarction affecting left non-dominant side: Secondary | ICD-10-CM | POA: Diagnosis not present

## 2017-04-12 DIAGNOSIS — Z79899 Other long term (current) drug therapy: Secondary | ICD-10-CM | POA: Diagnosis not present

## 2017-04-12 DIAGNOSIS — R200 Anesthesia of skin: Secondary | ICD-10-CM | POA: Diagnosis not present

## 2017-04-12 DIAGNOSIS — K769 Liver disease, unspecified: Secondary | ICD-10-CM | POA: Diagnosis not present

## 2017-04-12 DIAGNOSIS — M549 Dorsalgia, unspecified: Secondary | ICD-10-CM | POA: Diagnosis not present

## 2017-04-12 DIAGNOSIS — B9620 Unspecified Escherichia coli [E. coli] as the cause of diseases classified elsewhere: Secondary | ICD-10-CM | POA: Diagnosis not present

## 2017-04-12 DIAGNOSIS — Z7902 Long term (current) use of antithrombotics/antiplatelets: Secondary | ICD-10-CM | POA: Diagnosis not present

## 2017-04-12 DIAGNOSIS — I69351 Hemiplegia and hemiparesis following cerebral infarction affecting right dominant side: Secondary | ICD-10-CM

## 2017-04-12 DIAGNOSIS — I10 Essential (primary) hypertension: Secondary | ICD-10-CM | POA: Diagnosis not present

## 2017-04-12 DIAGNOSIS — I63512 Cerebral infarction due to unspecified occlusion or stenosis of left middle cerebral artery: Principal | ICD-10-CM | POA: Diagnosis present

## 2017-04-12 DIAGNOSIS — A419 Sepsis, unspecified organism: Secondary | ICD-10-CM | POA: Diagnosis not present

## 2017-04-12 DIAGNOSIS — G8192 Hemiplegia, unspecified affecting left dominant side: Secondary | ICD-10-CM | POA: Diagnosis not present

## 2017-04-12 DIAGNOSIS — G8929 Other chronic pain: Secondary | ICD-10-CM | POA: Diagnosis present

## 2017-04-12 DIAGNOSIS — A499 Bacterial infection, unspecified: Secondary | ICD-10-CM | POA: Diagnosis not present

## 2017-04-12 DIAGNOSIS — R29818 Other symptoms and signs involving the nervous system: Secondary | ICD-10-CM | POA: Diagnosis not present

## 2017-04-12 DIAGNOSIS — F4321 Adjustment disorder with depressed mood: Secondary | ICD-10-CM | POA: Diagnosis not present

## 2017-04-12 DIAGNOSIS — R410 Disorientation, unspecified: Secondary | ICD-10-CM | POA: Diagnosis not present

## 2017-04-12 DIAGNOSIS — Z885 Allergy status to narcotic agent status: Secondary | ICD-10-CM

## 2017-04-12 DIAGNOSIS — N3001 Acute cystitis with hematuria: Secondary | ICD-10-CM | POA: Diagnosis not present

## 2017-04-12 DIAGNOSIS — Z7982 Long term (current) use of aspirin: Secondary | ICD-10-CM | POA: Diagnosis not present

## 2017-04-12 DIAGNOSIS — Z79891 Long term (current) use of opiate analgesic: Secondary | ICD-10-CM | POA: Diagnosis not present

## 2017-04-12 DIAGNOSIS — R29703 NIHSS score 3: Secondary | ICD-10-CM | POA: Diagnosis present

## 2017-04-12 DIAGNOSIS — G8194 Hemiplegia, unspecified affecting left nondominant side: Secondary | ICD-10-CM | POA: Diagnosis not present

## 2017-04-12 DIAGNOSIS — I69398 Other sequelae of cerebral infarction: Secondary | ICD-10-CM | POA: Diagnosis not present

## 2017-04-12 DIAGNOSIS — I471 Supraventricular tachycardia: Secondary | ICD-10-CM | POA: Diagnosis not present

## 2017-04-12 DIAGNOSIS — R1310 Dysphagia, unspecified: Secondary | ICD-10-CM | POA: Diagnosis not present

## 2017-04-12 DIAGNOSIS — N390 Urinary tract infection, site not specified: Secondary | ICD-10-CM | POA: Diagnosis not present

## 2017-04-12 DIAGNOSIS — I69352 Hemiplegia and hemiparesis following cerebral infarction affecting left dominant side: Secondary | ICD-10-CM | POA: Diagnosis not present

## 2017-04-12 DIAGNOSIS — G8191 Hemiplegia, unspecified affecting right dominant side: Secondary | ICD-10-CM | POA: Diagnosis not present

## 2017-04-12 DIAGNOSIS — R7881 Bacteremia: Secondary | ICD-10-CM | POA: Diagnosis not present

## 2017-04-12 DIAGNOSIS — E785 Hyperlipidemia, unspecified: Secondary | ICD-10-CM | POA: Diagnosis not present

## 2017-04-12 LAB — URINALYSIS, MICROSCOPIC (REFLEX)

## 2017-04-12 LAB — DIFFERENTIAL
BASOS PCT: 0 %
Basophils Absolute: 0 10*3/uL (ref 0.0–0.1)
EOS ABS: 0.1 10*3/uL (ref 0.0–0.7)
Eosinophils Relative: 1 %
Lymphocytes Relative: 24 %
Lymphs Abs: 2.7 10*3/uL (ref 0.7–4.0)
Monocytes Absolute: 0.7 10*3/uL (ref 0.1–1.0)
Monocytes Relative: 7 %
NEUTROS PCT: 68 %
Neutro Abs: 7.6 10*3/uL (ref 1.7–7.7)

## 2017-04-12 LAB — RAPID URINE DRUG SCREEN, HOSP PERFORMED
Amphetamines: NOT DETECTED
BARBITURATES: NOT DETECTED
BENZODIAZEPINES: NOT DETECTED
COCAINE: NOT DETECTED
Opiates: POSITIVE — AB
TETRAHYDROCANNABINOL: NOT DETECTED

## 2017-04-12 LAB — CBC
HCT: 42 % (ref 39.0–52.0)
Hemoglobin: 14 g/dL (ref 13.0–17.0)
MCH: 30.4 pg (ref 26.0–34.0)
MCHC: 33.3 g/dL (ref 30.0–36.0)
MCV: 91.3 fL (ref 78.0–100.0)
Platelets: 382 10*3/uL (ref 150–400)
RBC: 4.6 MIL/uL (ref 4.22–5.81)
RDW: 13.5 % (ref 11.5–15.5)
WBC: 11.2 10*3/uL — AB (ref 4.0–10.5)

## 2017-04-12 LAB — I-STAT CHEM 8, ED
BUN: 17 mg/dL (ref 6–20)
CALCIUM ION: 1.08 mmol/L — AB (ref 1.15–1.40)
CHLORIDE: 107 mmol/L (ref 101–111)
Creatinine, Ser: 0.8 mg/dL (ref 0.61–1.24)
GLUCOSE: 106 mg/dL — AB (ref 65–99)
HCT: 44 % (ref 39.0–52.0)
Hemoglobin: 15 g/dL (ref 13.0–17.0)
Potassium: 4.1 mmol/L (ref 3.5–5.1)
SODIUM: 143 mmol/L (ref 135–145)
TCO2: 26 mmol/L (ref 22–32)

## 2017-04-12 LAB — ETHANOL

## 2017-04-12 LAB — COMPREHENSIVE METABOLIC PANEL
ALBUMIN: 4.4 g/dL (ref 3.5–5.0)
ALT: 30 U/L (ref 17–63)
AST: 24 U/L (ref 15–41)
Alkaline Phosphatase: 103 U/L (ref 38–126)
Anion gap: 9 (ref 5–15)
BUN: 15 mg/dL (ref 6–20)
CHLORIDE: 104 mmol/L (ref 101–111)
CO2: 24 mmol/L (ref 22–32)
Calcium: 9.1 mg/dL (ref 8.9–10.3)
Creatinine, Ser: 0.92 mg/dL (ref 0.61–1.24)
GFR calc Af Amer: >60 mL/min (ref >60)
GFR calc non Af Amer: >60 mL/min (ref >60)
GLUCOSE: 111 mg/dL — AB (ref 65–99)
Potassium: 3.8 mmol/L (ref 3.5–5.1)
Sodium: 137 mmol/L (ref 135–145)
Total Bilirubin: 0.6 mg/dL (ref 0.3–1.2)
Total Protein: 7.4 g/dL (ref 6.5–8.1)

## 2017-04-12 LAB — APTT: aPTT: 27 seconds (ref 24–36)

## 2017-04-12 LAB — CBG MONITORING, ED: Glucose-Capillary: 113 mg/dL — ABNORMAL HIGH (ref 65–99)

## 2017-04-12 LAB — I-STAT TROPONIN, ED: TROPONIN I, POC: 0 ng/mL (ref 0.00–0.08)

## 2017-04-12 LAB — PROTIME-INR
INR: 0.95
Prothrombin Time: 12.6 seconds (ref 11.4–15.2)

## 2017-04-12 MED ORDER — CLOPIDOGREL BISULFATE 75 MG PO TABS
75.0000 mg | ORAL_TABLET | Freq: Every day | ORAL | Status: DC
  Administered 2017-04-12 – 2017-04-13 (×2): 75 mg via ORAL
  Filled 2017-04-12 (×2): qty 1

## 2017-04-12 MED ORDER — ATORVASTATIN CALCIUM 40 MG PO TABS
80.0000 mg | ORAL_TABLET | Freq: Every day | ORAL | Status: DC
  Administered 2017-04-12: 80 mg via ORAL
  Filled 2017-04-12: qty 2
  Filled 2017-04-12 (×2): qty 1

## 2017-04-12 MED ORDER — STROKE: EARLY STAGES OF RECOVERY BOOK
Freq: Once | Status: AC
  Administered 2017-04-12: 1
  Filled 2017-04-12: qty 1

## 2017-04-12 MED ORDER — ACETAMINOPHEN 325 MG PO TABS: 650.0000 mg | ORAL_TABLET | ORAL | Status: DC | PRN

## 2017-04-12 MED ORDER — IOPAMIDOL (ISOVUE-370) INJECTION 76%
100.0000 mL | Freq: Once | INTRAVENOUS | Status: AC | PRN
  Administered 2017-04-12: 100 mL via INTRAVENOUS

## 2017-04-12 MED ORDER — ALPRAZOLAM 0.5 MG PO TABS: 0.5000 mg | ORAL_TABLET | Freq: Three times a day (TID) | ORAL | Status: DC | PRN

## 2017-04-12 MED ORDER — ONDANSETRON 8 MG PO TBDP
8.0000 mg | ORAL_TABLET | Freq: Three times a day (TID) | ORAL | Status: DC | PRN
  Filled 2017-04-12: qty 1

## 2017-04-12 MED ORDER — ACETAMINOPHEN 160 MG/5ML PO SOLN: 650.0000 mg | ORAL | Status: DC | PRN

## 2017-04-12 MED ORDER — TAMSULOSIN HCL 0.4 MG PO CAPS
0.4000 mg | ORAL_CAPSULE | Freq: Every day | ORAL | Status: DC
  Administered 2017-04-12 – 2017-04-13 (×2): 0.4 mg via ORAL
  Filled 2017-04-12 (×2): qty 1

## 2017-04-12 MED ORDER — ACETAMINOPHEN 650 MG RE SUPP: 650.0000 mg | RECTAL | Status: DC | PRN

## 2017-04-12 MED ORDER — SODIUM CHLORIDE 0.9 % IV SOLN
INTRAVENOUS | Status: DC
  Administered 2017-04-12: 07:00:00 via INTRAVENOUS

## 2017-04-12 MED ORDER — OXYCODONE-ACETAMINOPHEN 7.5-325 MG PO TABS
1.0000 | ORAL_TABLET | Freq: Four times a day (QID) | ORAL | Status: DC | PRN
  Administered 2017-04-12: 1 via ORAL
  Filled 2017-04-12: qty 1

## 2017-04-12 MED ORDER — ASPIRIN 325 MG PO TABS
325.0000 mg | ORAL_TABLET | Freq: Every day | ORAL | Status: DC
  Administered 2017-04-12 – 2017-04-13 (×2): 325 mg via ORAL
  Filled 2017-04-12 (×2): qty 1

## 2017-04-12 MED ORDER — ENOXAPARIN SODIUM 40 MG/0.4ML ~~LOC~~ SOLN
40.0000 mg | SUBCUTANEOUS | Status: DC
  Administered 2017-04-12 – 2017-04-13 (×2): 40 mg via SUBCUTANEOUS
  Filled 2017-04-12 (×2): qty 0.4

## 2017-04-12 NOTE — Evaluation (Addendum)
Physical Therapy Evaluation Patient Details Name: Brendan GreenhouseDouglas B Minar MRN: [id number] DOB: [birth year] Today's Date: 04/12/2017   History of Present Illness  Brendan GreenhouseDouglas B Nickson is a 61 y.o. male with history of stroke recently discharged 2 weeks ago presents to the ER with complaints of sudden onset of numbness and weakness of the left upper extremity around 9:30 PM last night (04/11/2017). Patient denies any difficulty speaking and swallowing or any visual symptoms. Patient was admitted 2 weeks ago for stroke with right-sided weakness and at that time patient had stroke workup and was placed on aspirin and Plavix and statins. Patient states he has been taking his medications.     Clinical Impression  Patient demonstrates poor sitting balance with frequent falling backwards, limited for taking steps due to poor coordination, limited motor control of BLE.  Patient will benefit from continued physical therapy in hospital and recommended venue below to increase strength, balance, endurance for safe ADLs and gait.  Patient will also benefit from use of wheelchair secondary to suffering from multiple CVAs which impairs his ability to perform daily activities like wakjubg and ADLs in the home.  A walker alone will not resolve the issues with performing activities of daily living. A wheelchair will allow patient to safely perform daily activities.  The patient can self propel in the home or has a caregiver who can provide assistance.   Recommend acute rehab.    [facility]   Equipment Recommendations  Rolling walker with 5" wheels;Wheelchair (measurements PT)    Recommendations for Other Services       Precautions / Restrictions Precautions Precautions: Fall Restrictions Weight Bearing Restrictions: No      Mobility  Bed Mobility Overal bed mobility: Needs Assistance Bed Mobility: Supine to Sit;Sit to Supine     Supine to sit: Mod assist Sit to  supine: Mod assist      Transfers Overall transfer level: Needs assistance Equipment used: Rolling walker (2 wheeled) Transfers: Sit to/from Stand Sit to Stand: Mod assist;Max assist Stand pivot transfers: Mod assist;Max assist       General transfer comment: Very unsteady on feet with most diffiulty side stepping, stepping backwareds when fully weight bearing on LLE to take steps with RLE  Ambulation/Gait Ambulation/Gait assistance: Max assist Ambulation Distance (Feet): 8 Feet Assistive device: Rolling walker (2 wheeled) Gait Pattern/deviations: Leaning posteriorly;Staggering left;Staggering right   Gait velocity interpretation: Below normal speed for age/gender General Gait Details: Patient unsteady on feet with frequent bucklng of knees due to weakness, poor motor control  Stairs            Wheelchair Mobility    Modified Rankin (Stroke Patients Only)       Balance Overall balance assessment: Needs assistance Sitting-balance support: Feet supported;Bilateral upper extremity supported Sitting balance-Leahy Scale: Poor   Postural control: Posterior lean Standing balance support: Bilateral upper extremity supported;During functional activity Standing balance-Leahy Scale: Poor                               Pertinent Vitals/Pain Pain Assessment: No/denies pain    Home Living Family/patient expects to be discharged to:: Private residence Living Arrangements: Children Available Help at Discharge: Family;Available 24 hours/day Type of Home: House Home Access: Stairs to enter   [employer] Corporation[employer]trance Stairs-Number of Steps: 5 in front rails to wide to use both, 17 step to 2nd floor in house with siderail on the left  Home Layout: Two level;Able to live on main level with bedroom/bathroom;Bed/bath upstairs Home Equipment: Walker - standard      Prior Function Level of Independence: Needs assistance   Gait / Transfers Assistance Needed: Since admission for CVA  2 weeks ago pt has been using RW for functional mobility purposes  ADL's / Homemaking Assistance Needed: Pt has been completing ADL tasks with supervision for safety.   Comments: Pt receiving PT/OT/RN services from [facility], reports this Friday was supposed to be his last day with them     Hand Dominance        Extremity/Trunk Assessment   Upper Extremity Assessment Upper Extremity Assessment: Defer to OT evaluation    Lower Extremity Assessment Lower Extremity Assessment: RLE deficits/detail;LLE deficits/detail RLE Deficits / Details: -4/5 LLE Deficits / Details: -3/5 LLE Coordination: decreased gross motor    Cervical / Trunk Assessment Cervical / Trunk Assessment: Normal  Communication   Communication: Expressive difficulties  Cognition Arousal/Alertness: Awake/alert Behavior During Therapy: WFL for tasks assessed/performed Overall Cognitive Status: Within Functional Limits for tasks assessed                                 General Comments: patient appears slightly anxious       General Comments      Exercises     Assessment/Plan    PT Assessment Patient needs continued PT services  PT Problem List Decreased strength;Decreased activity tolerance;Decreased balance;Decreased mobility;Decreased coordination;Impaired sensation;Decreased safety awareness       PT Treatment Interventions Gait training;Stair training;Functional mobility training;Therapeutic activities;Therapeutic exercise;Patient/family education    PT Goals (Current goals can be found in the Care Plan section)  Acute Rehab PT Goals Patient Stated Goal: return home after acute rehab PT Goal Formulation: With patient Time For Goal Achievement: 04/26/17 Potential to Achieve Goals: Good    Frequency Min 3X/week   Barriers to discharge        Co-evaluation               AM-PAC PT "6 Clicks" Daily Activity  Outcome Measure Difficulty turning over in bed  (including adjusting bedclothes, sheets and blankets)?: Unable Difficulty moving from lying on back to sitting on the side of the bed? : Unable Difficulty sitting down on and standing up from a chair with arms (e.g., wheelchair, bedside commode, etc,.)?: Unable Help needed moving to and from a bed to chair (including a wheelchair)?: A Lot Help needed walking in hospital room?: A Lot Help needed climbing 3-5 steps with a railing? : Total 6 Click Score: 8    End of Session Equipment Utilized During Treatment: Gait belt Activity Tolerance: Patient limited by fatigue Patient left: in bed;with call bell/phone within reach;with bed alarm set Nurse Communication: Mobility status PT Visit Diagnosis: Unsteadiness on feet (R26.81);Other abnormalities of gait and mobility (R26.89);Muscle weakness (generalized) (M62.81)    Time: 1610-9604 PT Time Calculation (min) (ACUTE ONLY): 32 min   Charges:   PT Evaluation $PT Eval Moderate Complexity: 1 Mod PT Treatments $Therapeutic Activity: 23-37 mins   PT G Codes:   PT G-Codes **NOT FOR INPATIENT CLASS** Functional Assessment Tool Used: AM-PAC 6 Clicks Basic Mobility Functional Limitation: Mobility: Walking and moving around Mobility: Walking and Moving Around Current Status (V4098): At least 80 percent but less than 100 percent impaired, limited or restricted Mobility: Walking and Moving Around Goal Status [phone]): At least 80 percent but less than 100 percent  impaired, limited or restricted Mobility: Walking and Moving Around Discharge Status [phone]): At least 80 percent but less than 100 percent impaired, limited or restricted    2:17 PM, 04/12/17 Ocie Bob, MPT Physical Therapist with [facility] 336 [phone] office [phone] mobile phone

## 2017-04-12 NOTE — Evaluation (Signed)
Clinical/Bedside Swallow Evaluation Patient Details  Name: Brendan Charles MRN: [id number] Date of Birth: 01/02/1956  Today's Date: 04/12/2017 Time: SLP Start Time (ACUTE ONLY): 40980925 SLP Stop Time (ACUTE ONLY): 0946 SLP Time Calculation (min) (ACUTE ONLY): 21 min  Past Medical History:  Past Medical History:  Diagnosis Date  . Anxiety   . Chronic back pain   . CVA (cerebral vascular accident) (HCC) 03/20/2017  . Hepatitis C    HEP C, never treated, 2007   Past Surgical History:  Past Surgical History:  Procedure Laterality Date  . CERVICAL DISC SURGERY  2001   anterior  . COLONOSCOPY WITH PROPOFOL N/A 03/14/2017   Procedure: COLONOSCOPY WITH PROPOFOL;  Surgeon: West BaliFields, Sandi L, MD;  Location: [facility];  Service: Endoscopy;  Laterality: N/A;  8:15am  . CYSTOSCOPY W/ URETERAL STENT PLACEMENT Left 03/23/2017   Procedure: CYSTOSCOPY WITH LEFT RETROGRADE PYELOGRAM/URETERAL LEFT STENT PLACEMENT;  Surgeon: Malen GauzeMcKenzie, Patrick L, MD;  Location: [facility];  Service: Urology;  Laterality: Left;  . LUMBAR SPINE SURGERY  2015  . POLYPECTOMY  03/14/2017   Procedure: POLYPECTOMY;  Surgeon: West BaliFields, Sandi L, MD;  Location: [facility];  Service: Endoscopy;;  rectum   HPI:  Brendan Charles is a 61 y.o. male with history of stroke recently discharged 2 weeks ago presents to the ER with complaints of sudden onset of numbness and weakness of the left upper extremity around 9:30 PM last night (04/11/2017). Patient denies any difficulty speaking and swallowing or any visual symptoms. Patient was admitted 2 weeks ago for stroke with right-sided weakness and at that time patient had stroke workup and was placed on aspirin and Plavix and statins. Patient states he has been taking his medications. MRI shows acute right medullary infarct.   Assessment / Plan / Recommendation Clinical Impression  Pt admitted with acute right medullary CVA and c/o difficulty "chewing" when SLE completed. BSE completed and pt with  mild oral phase dysphagia and suspected normal pharyngeal phase at this time. Pt with reduced lingual strength and coordination resulting in prolonged oral phase and mild lingual residue. Pt benefited from liquid wash. Recommend D3/mech soft with thin liquids and monitor for tolerance and upgrades. Pt in agreement with plan of care. Above to RN.  SLP Visit Diagnosis: Dysphagia, oropharyngeal phase (R13.12)    Aspiration Risk  Mild aspiration risk    Diet Recommendation Dysphagia 3 (Mech soft);Thin liquid   Liquid Administration via: Cup;Straw Medication Administration: Whole meds with liquid Supervision: Patient able to self feed;Intermittent supervision to cue for compensatory strategies Compensations: Slow rate;Lingual sweep for clearance of pocketing;Follow solids with liquid Postural Changes: Seated upright at 90 degrees;Remain upright for at least 30 minutes after po intake    Other  Recommendations Oral Care Recommendations: Oral care BID;Patient independent with oral care Other Recommendations: Clarify dietary restrictions   Follow up Recommendations Inpatient Rehab      Frequency and Duration min 2x/week  1 week       Prognosis Prognosis for Safe Diet Advancement: Good Barriers to Reach Goals: Severity of deficits      Swallow Study   General Date of Onset: 04/12/17 HPI: Brendan Charles is a 61 y.o. male with history of stroke recently discharged 2 weeks ago presents to the ER with complaints of sudden onset of numbness and weakness of the left upper extremity around 9:30 PM last night (04/11/2017). Patient denies any difficulty speaking and swallowing or any visual symptoms. Patient was admitted 2 weeks ago  for stroke with right-sided weakness and at that time patient had stroke workup and was placed on aspirin and Plavix and statins. Patient states he has been taking his medications. MRI shows acute right medullary infarct. Type of Study: Bedside Swallow Evaluation Diet  Prior to this Study: Regular;Thin liquids Temperature Spikes Noted: No Respiratory Status: Room air History of Recent Intubation: No Behavior/Cognition: Alert;Cooperative;Pleasant mood Oral Cavity Assessment: Within Functional Limits Oral Care Completed by SLP: No Oral Cavity - Dentition: Adequate natural dentition;Missing dentition Vision: Functional for self-feeding Self-Feeding Abilities: Able to feed self Patient Positioning: Upright in bed Baseline Vocal Quality: Normal;Low vocal intensity Volitional Cough: Strong Volitional Swallow: Able to elicit    Oral/Motor/Sensory Function Overall Oral Motor/Sensory Function: Mild impairment Facial ROM: Within Functional Limits Facial Symmetry: Within Functional Limits Facial Strength: Within Functional Limits Facial Sensation: Within Functional Limits Lingual ROM: Within Functional Limits (decreased coordination) Lingual Symmetry: Within Functional Limits Lingual Strength: Reduced;Suspected CN XII (hypoglossal) dysfunction Lingual Sensation: Within Functional Limits Velum: Within Functional Limits Mandible: Within Functional Limits   Ice Chips Ice chips: Not tested   Thin Liquid Thin Liquid: Within functional limits Presentation: Self Fed;Straw    Nectar Thick Nectar Thick Liquid: Not tested   Honey Thick Honey Thick Liquid: Not tested   Puree Puree: Within functional limits Presentation: Spoon   Solid   GO   Solid: Impaired Presentation: Self Fed Oral Phase Impairments: Reduced lingual movement/coordination Oral Phase Functional Implications: Prolonged oral transit;Oral residue (mild and assisted with liquid wash)       Thank you,  Havery Moros, CCC-SLP [phone]  PORTER,DABNEY 04/12/2017,10:28 AM

## 2017-04-12 NOTE — H&P (Signed)
History and Physical    Brendan Charles:[id number] DOB: April 10, 1956 DOA: 04/12/2017  PCP: Elfredia Nevins, MD  Patient coming from: Home.  Chief Complaint: Left upper extremity numbness and weakness.  HPI: Brendan Charles is a 61 y.o. male with history of stroke recently discharged 2 weeks ago presents to the ER with complaints of sudden onset of numbness and weakness of the left upper extremity around 9:30 PM last night (04/11/2017). Patient denies any difficulty speaking and swallowing or any visual symptoms. Patient was admitted 2 weeks ago for stroke with right-sided weakness and at that time patient had stroke workup and was placed on aspirin and Plavix and statins. Patient states he has been taking his medications.   ED Course: In the ER on exam patient has left upper extremity pronator drift with weakness. CT of the head did not show anything acute. Tele-neurology consult was obtained. At this time they recommended no TPN since patient had recent stroke and advised to get CT angiogram of the head and neck to see if there is any vascular intervention to be done. CT angiogram of the head and neck did not show any lesion that can be intervened (I did discuss the CT angiogram head and neck results with Dr. Amada Jupiter neurologist at [facility]). Patient is being admitted for further management of possible stroke.  Review of Systems: As per HPI, rest all negative.   Past Medical History:  Diagnosis Date  . Anxiety   . Chronic back pain   . CVA (cerebral vascular accident) (HCC) 03/20/2017  . Hepatitis C    HEP C, never treated, 2007    Past Surgical History:  Procedure Laterality Date  . CERVICAL DISC SURGERY  2001   anterior  . COLONOSCOPY WITH PROPOFOL N/A 03/14/2017   Procedure: COLONOSCOPY WITH PROPOFOL;  Surgeon: West Bali, MD;  Location: [facility];  Service: Endoscopy;  Laterality: N/A;  8:15am  . CYSTOSCOPY W/ URETERAL STENT PLACEMENT Left 03/23/2017   Procedure: CYSTOSCOPY WITH LEFT RETROGRADE PYELOGRAM/URETERAL LEFT STENT PLACEMENT;  Surgeon: Malen Gauze, MD;  Location: [facility];  Service: Urology;  Laterality: Left;  . LUMBAR SPINE SURGERY  2015  . POLYPECTOMY  03/14/2017   Procedure: POLYPECTOMY;  Surgeon: West Bali, MD;  Location: [facility];  Service: Endoscopy;;  rectum     reports that he has quit smoking. His smoking use included Cigarettes. He has a 25.00 pack-year smoking history. He has never used smokeless tobacco. He reports that he does not drink alcohol or use drugs.  Allergies  Allergen Reactions  . Codeine Itching and Nausea Only    Family History  Problem Relation Age of Onset  . Cancer Mother   . Colon cancer Neg Hx     Prior to Admission medications   Medication Sig Start Date End Date Taking? Authorizing Provider  ALPRAZolam Prudy Feeler) 0.5 MG tablet Take 0.5 mg by mouth 3 (three) times daily as needed for anxiety.    [provider]  aspirin 325 MG tablet Take 1 tablet (325 mg total) by mouth daily. 03/22/17   Rhetta Mura, MD  atorvastatin (LIPITOR) 80 MG tablet Take 1 tablet (80 mg total) by mouth daily at 6 PM. 03/21/17   Rhetta Mura, MD  clopidogrel (PLAVIX) 75 MG tablet Take 1 tablet (75 mg total) by mouth daily with breakfast. 03/22/17   Rhetta Mura, MD  HYDROcodone-acetaminophen (NORCO) 10-325 MG tablet Take 1 tablet by mouth 4 (four) times daily.  [provider]  ondansetron (ZOFRAN ODT) 8 MG disintegrating tablet Take 1 tablet (8 mg total) by mouth every 8 (eight) hours as needed for nausea or vomiting. 03/18/17   Triplett, Tammy, PA-C  oxyCODONE-acetaminophen (PERCOCET) 7.5-325 MG tablet Take 1 tablet by mouth every 6 (six) hours as needed for severe pain. 03/23/17   McKenzie, Mardene CelestePatrick L, MD  phenazopyridine (PYRIDIUM) 100 MG tablet Take 1 tablet (100 mg total) by mouth 3 (three) times daily as needed for pain. 03/23/17   McKenzie, Mardene CelestePatrick L, MD    tamsulosin (FLOMAX) 0.4 MG CAPS capsule Take 1 capsule (0.4 mg total) by mouth daily. 03/23/17   Malen GauzeMcKenzie, Patrick L, MD    Physical Exam: Vitals:   04/12/17 0110 04/12/17 0115 04/12/17 0130 04/12/17 0145  BP: (!) 173/104 (!) 127/99 (!) 157/85 (!) 175/104  Pulse: 86 85 83 86  Resp: 13 19 19 15   Temp:      TempSrc:      SpO2: 99% 99% 99% 98%  Weight:      Height:          Constitutional: Moderately built and nourished. Vitals:   04/12/17 0110 04/12/17 0115 04/12/17 0130 04/12/17 0145  BP: (!) 173/104 (!) 127/99 (!) 157/85 (!) 175/104  Pulse: 86 85 83 86  Resp: 13 19 19 15   Temp:      TempSrc:      SpO2: 99% 99% 99% 98%  Weight:      Height:       Eyes: Anicteric. No pallor. ENMT: No discharge from the ears eyes nose and mouth. Neck: No mass felt. No JVD appreciated. No neck rigidity. Respiratory: No rhonchi or crepitations. Cardiovascular: S1 and S2 heard. No murmurs appreciated. Abdomen: Soft nontender bowel sounds present. Musculoskeletal: No edema. No joint effusion. Skin: No rash. His name is warm. Neurologic: Alert awake oriented to time place and person. Left upper extremity weakness 4 x 5 in strength with pronator drift. Mildly worse on the right lower extremity. No facial asymmetry tongue is midline. Pupils are equal and reacting to light. Psychiatric: Appears normal. Normal affect.   Labs on Admission: I have personally reviewed following labs and imaging studies  CBC:  Recent Labs Lab 04/12/17 0040 04/12/17 0046  WBC 11.2*  --   NEUTROABS 7.6  --   HGB 14.0 15.0  HCT 42.0 44.0  MCV 91.3  --   PLT 382  --    Basic Metabolic Panel:  Recent Labs Lab 04/12/17 0040 04/12/17 0046  NA 137 143  K 3.8 4.1  CL 104 107  CO2 24  --   GLUCOSE 111* 106*  BUN 15 17  CREATININE 0.92 0.80  CALCIUM 9.1  --    GFR: Estimated Creatinine Clearance: 98.3 mL/min (by C-G formula based on SCr of 0.8 mg/dL). Liver Function Tests:  Recent Labs Lab  04/12/17 0040  AST 24  ALT 30  ALKPHOS 103  BILITOT 0.6  PROT 7.4  ALBUMIN 4.4   No results for input(s): LIPASE, AMYLASE in the last 168 hours. No results for input(s): AMMONIA in the last 168 hours. Coagulation Profile:  Recent Labs Lab 04/12/17 0040  INR 0.95   Cardiac Enzymes: No results for input(s): CKTOTAL, CKMB, CKMBINDEX, TROPONINI in the last 168 hours. BNP (last 3 results) No results for input(s): PROBNP in the last 8760 hours. HbA1C: No results for input(s): HGBA1C in the last 72 hours. CBG:  Recent Labs Lab 04/12/17 0040  GLUCAP 113*   Lipid  Profile: No results for input(s): CHOL, HDL, LDLCALC, TRIG, CHOLHDL, LDLDIRECT in the last 72 hours. Thyroid Function Tests: No results for input(s): TSH, T4TOTAL, FREET4, T3FREE, THYROIDAB in the last 72 hours. Anemia Panel: No results for input(s): VITAMINB12, FOLATE, FERRITIN, TIBC, IRON, RETICCTPCT in the last 72 hours. Urine analysis:    Component Value Date/Time   COLORURINE RED (A) 04/12/2017 0205   APPEARANCEUR CLOUDY (A) 04/12/2017 0205   LABSPEC  04/12/2017 0205    TEST NOT REPORTED DUE TO COLOR INTERFERENCE OF URINE PIGMENT   PHURINE  04/12/2017 0205    TEST NOT REPORTED DUE TO COLOR INTERFERENCE OF URINE PIGMENT   GLUCOSEU (A) 04/12/2017 0205    TEST NOT REPORTED DUE TO COLOR INTERFERENCE OF URINE PIGMENT   HGBUR (A) 04/12/2017 0205    TEST NOT REPORTED DUE TO COLOR INTERFERENCE OF URINE PIGMENT   BILIRUBINUR (A) 04/12/2017 0205    TEST NOT REPORTED DUE TO COLOR INTERFERENCE OF URINE PIGMENT   KETONESUR (A) 04/12/2017 0205    TEST NOT REPORTED DUE TO COLOR INTERFERENCE OF URINE PIGMENT   PROTEINUR (A) 04/12/2017 0205    TEST NOT REPORTED DUE TO COLOR INTERFERENCE OF URINE PIGMENT   NITRITE (A) 04/12/2017 0205    TEST NOT REPORTED DUE TO COLOR INTERFERENCE OF URINE PIGMENT   LEUKOCYTESUR (A) 04/12/2017 0205    TEST NOT REPORTED DUE TO COLOR INTERFERENCE OF URINE PIGMENT   Sepsis  Labs: @LABRCNTIP (procalcitonin:4,lacticidven:4) )No results found for this or any previous visit (from the past 240 hour(s)).   Radiological Exams on Admission: Ct Angio Head W Or Wo Contrast  Result Date: 04/12/2017 CLINICAL DATA:  61 y/o  M; 61 y/o  M; right-sided numbness. EXAM: CT ANGIOGRAPHY HEAD AND NECK TECHNIQUE: Multidetector CT imaging of the head and neck was performed using the standard protocol during bolus administration of intravenous contrast. Multiplanar CT image reconstructions and MIPs were obtained to evaluate the vascular anatomy. Carotid stenosis measurements (when applicable) are obtained utilizing NASCET criteria, using the distal internal carotid diameter as the denominator. CONTRAST:  100 cc Isovue 370 COMPARISON:  04/12/2017 CT head.  03/20/2017 MRI head. FINDINGS: CTA NECK FINDINGS Aortic arch: Standard branching. Imaged portion shows no evidence of aneurysm or dissection. No significant stenosis of the major arch vessel origins. Right carotid system: No evidence of dissection, stenosis (50% or greater) or occlusion. Left carotid system: No evidence of dissection, stenosis (50% or greater) or occlusion. Vertebral arteries: Codominant. No evidence of dissection, stenosis (50% or greater) or occlusion. Skeleton: C5-C7 anterior cervical discectomy and fusion. Advanced cervical degenerative changes at the C3-4 level with severe disc space narrowing, a disc protrusion, and anterior marginal osteophytes. Other neck: 13 mm nodule within right lobe of thyroid. Upper chest: Negative. Review of the MIP images confirms the above findings CTA HEAD FINDINGS Anterior circulation: No significant stenosis, proximal occlusion, aneurysm, or vascular malformation. Posterior circulation: Tandem segments of severe stenosis of right vertebral artery. Patent left vertebral artery, basilar artery, and bilateral posterior cerebral arteries. No large vessel occlusion, aneurysm, or significant stenosis is  identified. Venous sinuses: As permitted by contrast timing, patent. Anatomic variants: Anterior communicating artery and probable diminutive bilateral posterior communicating arteries. Delayed phase: No abnormal intracranial enhancement. Review of the MIP images confirms the above findings IMPRESSION: 1. Patent carotid and vertebral arteries in the neck. No dissection, aneurysm, or hemodynamically significant stenosis utilizing NASCET criteria. 2. Patent circle of Willis.  No large vessel occlusion or aneurysm. 3. Tandem segments of severe  stenosis of right vertebral artery proximal to PICA origin. Otherwise no significant intracranial arterial stenosis. These results were called by telephone at the time of interpretation on 04/12/2017 at 2:28 am to Dr. Jaci Carrel , who verbally acknowledged these results. Electronically Signed   By: Mitzi Hansen M.D.   On: 04/12/2017 02:28   Ct Angio Neck W And/or Wo Contrast  Result Date: 04/12/2017 CLINICAL DATA:  61 y/o  M; 61 y/o  M; right-sided numbness. EXAM: CT ANGIOGRAPHY HEAD AND NECK TECHNIQUE: Multidetector CT imaging of the head and neck was performed using the standard protocol during bolus administration of intravenous contrast. Multiplanar CT image reconstructions and MIPs were obtained to evaluate the vascular anatomy. Carotid stenosis measurements (when applicable) are obtained utilizing NASCET criteria, using the distal internal carotid diameter as the denominator. CONTRAST:  100 cc Isovue 370 COMPARISON:  04/12/2017 CT head.  03/20/2017 MRI head. FINDINGS: CTA NECK FINDINGS Aortic arch: Standard branching. Imaged portion shows no evidence of aneurysm or dissection. No significant stenosis of the major arch vessel origins. Right carotid system: No evidence of dissection, stenosis (50% or greater) or occlusion. Left carotid system: No evidence of dissection, stenosis (50% or greater) or occlusion. Vertebral arteries: Codominant. No evidence  of dissection, stenosis (50% or greater) or occlusion. Skeleton: C5-C7 anterior cervical discectomy and fusion. Advanced cervical degenerative changes at the C3-4 level with severe disc space narrowing, a disc protrusion, and anterior marginal osteophytes. Other neck: 13 mm nodule within right lobe of thyroid. Upper chest: Negative. Review of the MIP images confirms the above findings CTA HEAD FINDINGS Anterior circulation: No significant stenosis, proximal occlusion, aneurysm, or vascular malformation. Posterior circulation: Tandem segments of severe stenosis of right vertebral artery. Patent left vertebral artery, basilar artery, and bilateral posterior cerebral arteries. No large vessel occlusion, aneurysm, or significant stenosis is identified. Venous sinuses: As permitted by contrast timing, patent. Anatomic variants: Anterior communicating artery and probable diminutive bilateral posterior communicating arteries. Delayed phase: No abnormal intracranial enhancement. Review of the MIP images confirms the above findings IMPRESSION: 1. Patent carotid and vertebral arteries in the neck. No dissection, aneurysm, or hemodynamically significant stenosis utilizing NASCET criteria. 2. Patent circle of Willis.  No large vessel occlusion or aneurysm. 3. Tandem segments of severe stenosis of right vertebral artery proximal to PICA origin. Otherwise no significant intracranial arterial stenosis. These results were called by telephone at the time of interpretation on 04/12/2017 at 2:28 am to Dr. Jaci Carrel , who verbally acknowledged these results. Electronically Signed   By: Mitzi Hansen M.D.   On: 04/12/2017 02:28   Ct Head Code Stroke Wo Contrast`  Result Date: 04/12/2017 CLINICAL DATA:  Code stroke. These results were called by telephone at the time of interpretation on 04/12/2017 at 1:05 am to Dr. Jaci Carrel , who verbally acknowledged these results. Right-sided numbness. EXAM: CT HEAD  WITHOUT CONTRAST TECHNIQUE: Contiguous axial images were obtained from the base of the skull through the vertex without intravenous contrast. COMPARISON:  03/20/2017 CT head FINDINGS: Brain: No evidence of acute infarction, hemorrhage, hydrocephalus, extra-axial collection or mass lesion/mass effect. The chronic lacunar infarcts are present within the bilateral caudate heads, bilateral putamen, and left caudate body/ anterior corona radiata. Additionally, there are small chronic periventricular infarcts within the bilateral frontal lobes. Vascular: The calcific atherosclerosis of carotid siphons. No hyperdense vessel identified. Skull: Normal. Negative for fracture or focal lesion. Sinuses/Orbits: No acute finding. Other: None. ASPECTS [facility] Stroke Program Early CT Score) - Ganglionic level infarction (  caudate, lentiform nuclei, internal capsule, insula, M1-M3 cortex): 7 - Supraganglionic infarction (M4-M6 cortex): 3 Total score (0-10 with 10 being normal): 10 IMPRESSION: 1. No acute intracranial abnormality identified. 2. ASPECTS is 10 3. Multiple stable chronic lacunar infarcts in basal ganglia and periventricular white matter. These results were called by telephone at the time of interpretation on 04/12/2017 at 1:08 am to Dr. Jaci Carrel , who verbally acknowledged these results. Electronically Signed   By: Mitzi Hansen M.D.   On: 04/12/2017 01:10    EKG: Independently reviewed. Normal sinus rhythm.  Assessment/Plan Principal Problem:   Acute ischemic stroke [facility]) Active Problems:   Stroke (cerebrum) (HCC)    1. Stroke - patient's symptoms are concerning for stroke. Patient passed swallow will continue with aspirin and Plavix and statins. MRI brain has been ordered. Patient has had recent 2-D echo and also during this admission has had CT angiogram of the head and neck. Patient has been placed on neuro checks. After MRI brain May discuss with on-call neurologist for further  recommendations. Physical therapy consult. 2. History of hepatitis C per the chart. 3. Tobacco abuse - tobacco cessation counseling requested. 4. Recent intervention for left ureteral stone. 5. Recent MRA brain showed possible left posterior communicating artery aneurysm and tonight CT head and neck did not show any aneurysm. 6. Indeterminant liver lesion seen during last admission will need further workup as outpatient.  I have reviewed patient's old charts and labs.   DVT prophylaxis: Lovenox. Code Status: Full code.  Family Communication: Patient's son.  Disposition Plan: Home.  Consults called: ER physician had discussed with Tele-neurology.  Admission status: Observation.    Eduard Clos MD [facility] Pager [phone].  If 7PM-7AM, please contact night-coverage [email or web address] Password [email or web address]  04/12/2017, 3:57 AM

## 2017-04-12 NOTE — ED Triage Notes (Signed)
Pt states symptoms started around 2130 after laying down, pt states feels right sided numbness, no other s/s

## 2017-04-12 NOTE — ED Provider Notes (Signed)
[facility] Provider Note   CSN: [id number] Arrival date & time: 04/12/17  0033     History   Chief Complaint Chief Complaint  Patient presents with  . Numbness    left sided    HPI LUKE FALERO is a 61 y.o. male.  Patient presents to the ER for evaluation of left-sided numbness and weakness. Symptoms began suddenly 3 hours before arrival at the ER. Patient denies headache. Symptoms have been persistent since they began, no alleviating or exacerbating factors. He has not noticed any speech difficulty, facial numbness or drooping.      Past Medical History:  Diagnosis Date  . Anxiety   . Chronic back pain   . CVA (cerebral vascular accident) (HCC) 03/20/2017  . Hepatitis C    HEP C, never treated, 2007    Patient Active Problem List   Diagnosis Date Noted  . CVA (cerebral vascular accident) (HCC) 03/20/2017  . Hydronephrosis, left 03/20/2017  . Aortic atherosclerosis (HCC) 03/20/2017  . Hepatitis C 02/14/2017  . Encounter for screening colonoscopy 02/14/2017  . Spondylolisthesis at L3-L4 level 06/03/2014    Past Surgical History:  Procedure Laterality Date  . CERVICAL DISC SURGERY  2001   anterior  . COLONOSCOPY WITH PROPOFOL N/A 03/14/2017   Procedure: COLONOSCOPY WITH PROPOFOL;  Surgeon: West Bali, MD;  Location: [facility];  Service: Endoscopy;  Laterality: N/A;  8:15am  . CYSTOSCOPY W/ URETERAL STENT PLACEMENT Left 03/23/2017   Procedure: CYSTOSCOPY WITH LEFT RETROGRADE PYELOGRAM/URETERAL LEFT STENT PLACEMENT;  Surgeon: Malen Gauze, MD;  Location: [facility];  Service: Urology;  Laterality: Left;  . LUMBAR SPINE SURGERY  2015  . POLYPECTOMY  03/14/2017   Procedure: POLYPECTOMY;  Surgeon: West Bali, MD;  Location: [facility];  Service: Endoscopy;;  rectum       Home Medications    Prior to Admission medications   Medication Sig Start Date End Date Taking? Authorizing Provider  ALPRAZolam Prudy Feeler) 0.5 MG tablet Take 0.5 mg by  mouth 3 (three) times daily as needed for anxiety.    [provider]  aspirin 325 MG tablet Take 1 tablet (325 mg total) by mouth daily. 03/22/17   Rhetta Mura, MD  atorvastatin (LIPITOR) 80 MG tablet Take 1 tablet (80 mg total) by mouth daily at 6 PM. 03/21/17   Rhetta Mura, MD  clopidogrel (PLAVIX) 75 MG tablet Take 1 tablet (75 mg total) by mouth daily with breakfast. 03/22/17   Rhetta Mura, MD  HYDROcodone-acetaminophen (NORCO) 10-325 MG tablet Take 1 tablet by mouth 4 (four) times daily.     [provider]  ondansetron (ZOFRAN ODT) 8 MG disintegrating tablet Take 1 tablet (8 mg total) by mouth every 8 (eight) hours as needed for nausea or vomiting. 03/18/17   Triplett, Tammy, PA-C  oxyCODONE-acetaminophen (PERCOCET) 7.5-325 MG tablet Take 1 tablet by mouth every 6 (six) hours as needed for severe pain. 03/23/17   McKenzie, Mardene Celeste, MD  phenazopyridine (PYRIDIUM) 100 MG tablet Take 1 tablet (100 mg total) by mouth 3 (three) times daily as needed for pain. 03/23/17   McKenzie, Mardene Celeste, MD  tamsulosin (FLOMAX) 0.4 MG CAPS capsule Take 1 capsule (0.4 mg total) by mouth daily. 03/23/17   McKenzie, Mardene Celeste, MD    Family History Family History  Problem Relation Age of Onset  . Cancer Mother   . Colon cancer Neg Hx     Social History Social History  Substance Use Topics  .  Smoking status: Former Smoker    Packs/day: 0.50    Years: 50.00    Types: Cigarettes  . Smokeless tobacco: Never Used     Comment: pt states quit smoking 1 wk ago  . Alcohol use No     Allergies   Codeine   Review of Systems Review of Systems  Neurological: Positive for weakness and numbness.  All other systems reviewed and are negative.    Physical Exam Updated Vital Signs BP (!) 175/104   Pulse 86   Temp 98.2 F (36.8 C) (Oral)   Resp 15   Ht 5\' 11"  (1.803 m)   Wt 72.6 kg (160 lb)   SpO2 98%   BMI 22.32 kg/m   Physical Exam  Constitutional: He is  oriented to person, place, and time. He appears well-developed and well-nourished. No distress.  HENT:  Head: Normocephalic and atraumatic.  Right Ear: Hearing normal.  Left Ear: Hearing normal.  Nose: Nose normal.  Mouth/Throat: Oropharynx is clear and moist and mucous membranes are normal.  Eyes: Pupils are equal, round, and reactive to light. Conjunctivae and EOM are normal.  Neck: Normal range of motion. Neck supple.  Cardiovascular: Regular rhythm, S1 normal and S2 normal.  Exam reveals no gallop and no friction rub.   No murmur heard. Pulmonary/Chest: Effort normal and breath sounds normal. No respiratory distress. He exhibits no tenderness.  Abdominal: Soft. Normal appearance and bowel sounds are normal. There is no hepatosplenomegaly. There is no tenderness. There is no rebound, no guarding, no tenderness at McBurney's point and negative Murphy's sign. No hernia.  Musculoskeletal: He exhibits no edema.  Neurological: He is alert and oriented to person, place, and time. No cranial nerve deficit or sensory deficit. He exhibits abnormal muscle tone. Coordination normal. GCS eye subscore is 4. GCS verbal subscore is 5. GCS motor subscore is 6.  Patient with diminished strength and tone on the right consistent with recent stroke  Patient with decreased strength, tone and subjective decreased sensation left arm and left leg  Skin: Skin is warm, dry and intact. No rash noted. No cyanosis.  Psychiatric: He has a normal mood and affect. His speech is normal and behavior is normal. Thought content normal.  Nursing note and vitals reviewed.    ED Treatments / Results  Labs (all labs ordered are listed, but only abnormal results are displayed) Labs Reviewed  CBC - Abnormal; Notable for the following:       Result Value   WBC 11.2 (*)    All other components within normal limits  COMPREHENSIVE METABOLIC PANEL - Abnormal; Notable for the following:    Glucose, Bld 111 (*)    All other  components within normal limits  CBG MONITORING, ED - Abnormal; Notable for the following:    Glucose-Capillary 113 (*)    All other components within normal limits  I-STAT CHEM 8, ED - Abnormal; Notable for the following:    Glucose, Bld 106 (*)    Calcium, Ion 1.08 (*)    All other components within normal limits  ETHANOL  PROTIME-INR  APTT  DIFFERENTIAL  RAPID URINE DRUG SCREEN, HOSP PERFORMED  URINALYSIS, ROUTINE W REFLEX MICROSCOPIC  I-STAT TROPONIN, ED    EKG  EKG Interpretation None       Radiology Ct Angio Head W Or Wo Contrast  Result Date: 04/12/2017 CLINICAL DATA:  61 y/o  M; 61 y/o  M; right-sided numbness. EXAM: CT ANGIOGRAPHY HEAD AND NECK TECHNIQUE: Multidetector CT imaging  of the head and neck was performed using the standard protocol during bolus administration of intravenous contrast. Multiplanar CT image reconstructions and MIPs were obtained to evaluate the vascular anatomy. Carotid stenosis measurements (when applicable) are obtained utilizing NASCET criteria, using the distal internal carotid diameter as the denominator. CONTRAST:  100 cc Isovue 370 COMPARISON:  04/12/2017 CT head.  03/20/2017 MRI head. FINDINGS: CTA NECK FINDINGS Aortic arch: Standard branching. Imaged portion shows no evidence of aneurysm or dissection. No significant stenosis of the major arch vessel origins. Right carotid system: No evidence of dissection, stenosis (50% or greater) or occlusion. Left carotid system: No evidence of dissection, stenosis (50% or greater) or occlusion. Vertebral arteries: Codominant. No evidence of dissection, stenosis (50% or greater) or occlusion. Skeleton: C5-C7 anterior cervical discectomy and fusion. Advanced cervical degenerative changes at the C3-4 level with severe disc space narrowing, a disc protrusion, and anterior marginal osteophytes. Other neck: 13 mm nodule within right lobe of thyroid. Upper chest: Negative. Review of the MIP images confirms the above  findings CTA HEAD FINDINGS Anterior circulation: No significant stenosis, proximal occlusion, aneurysm, or vascular malformation. Posterior circulation: Tandem segments of severe stenosis of right vertebral artery. Patent left vertebral artery, basilar artery, and bilateral posterior cerebral arteries. No large vessel occlusion, aneurysm, or significant stenosis is identified. Venous sinuses: As permitted by contrast timing, patent. Anatomic variants: Anterior communicating artery and probable diminutive bilateral posterior communicating arteries. Delayed phase: No abnormal intracranial enhancement. Review of the MIP images confirms the above findings IMPRESSION: 1. Patent carotid and vertebral arteries in the neck. No dissection, aneurysm, or hemodynamically significant stenosis utilizing NASCET criteria. 2. Patent circle of Willis.  No large vessel occlusion or aneurysm. 3. Tandem segments of severe stenosis of right vertebral artery proximal to PICA origin. Otherwise no significant intracranial arterial stenosis. These results were called by telephone at the time of interpretation on 04/12/2017 at 2:28 am to Dr. Jaci CarrelHRISTOPHER POLLINA , who verbally acknowledged these results. Electronically Signed   By: Mitzi HansenLance  Furusawa-Stratton M.D.   On: 04/12/2017 02:28   Ct Angio Neck W And/or Wo Contrast  Result Date: 04/12/2017 CLINICAL DATA:  61 y/o  M; 61 y/o  M; right-sided numbness. EXAM: CT ANGIOGRAPHY HEAD AND NECK TECHNIQUE: Multidetector CT imaging of the head and neck was performed using the standard protocol during bolus administration of intravenous contrast. Multiplanar CT image reconstructions and MIPs were obtained to evaluate the vascular anatomy. Carotid stenosis measurements (when applicable) are obtained utilizing NASCET criteria, using the distal internal carotid diameter as the denominator. CONTRAST:  100 cc Isovue 370 COMPARISON:  04/12/2017 CT head.  03/20/2017 MRI head. FINDINGS: CTA NECK FINDINGS  Aortic arch: Standard branching. Imaged portion shows no evidence of aneurysm or dissection. No significant stenosis of the major arch vessel origins. Right carotid system: No evidence of dissection, stenosis (50% or greater) or occlusion. Left carotid system: No evidence of dissection, stenosis (50% or greater) or occlusion. Vertebral arteries: Codominant. No evidence of dissection, stenosis (50% or greater) or occlusion. Skeleton: C5-C7 anterior cervical discectomy and fusion. Advanced cervical degenerative changes at the C3-4 level with severe disc space narrowing, a disc protrusion, and anterior marginal osteophytes. Other neck: 13 mm nodule within right lobe of thyroid. Upper chest: Negative. Review of the MIP images confirms the above findings CTA HEAD FINDINGS Anterior circulation: No significant stenosis, proximal occlusion, aneurysm, or vascular malformation. Posterior circulation: Tandem segments of severe stenosis of right vertebral artery. Patent left vertebral artery, basilar artery, and bilateral  posterior cerebral arteries. No large vessel occlusion, aneurysm, or significant stenosis is identified. Venous sinuses: As permitted by contrast timing, patent. Anatomic variants: Anterior communicating artery and probable diminutive bilateral posterior communicating arteries. Delayed phase: No abnormal intracranial enhancement. Review of the MIP images confirms the above findings IMPRESSION: 1. Patent carotid and vertebral arteries in the neck. No dissection, aneurysm, or hemodynamically significant stenosis utilizing NASCET criteria. 2. Patent circle of Willis.  No large vessel occlusion or aneurysm. 3. Tandem segments of severe stenosis of right vertebral artery proximal to PICA origin. Otherwise no significant intracranial arterial stenosis. These results were called by telephone at the time of interpretation on 04/12/2017 at 2:28 am to Dr. Jaci Carrel , who verbally acknowledged these results.  Electronically Signed   By: Mitzi Hansen M.D.   On: 04/12/2017 02:28   Ct Head Code Stroke Wo Contrast`  Result Date: 04/12/2017 CLINICAL DATA:  Code stroke. These results were called by telephone at the time of interpretation on 04/12/2017 at 1:05 am to Dr. Jaci Carrel , who verbally acknowledged these results. Right-sided numbness. EXAM: CT HEAD WITHOUT CONTRAST TECHNIQUE: Contiguous axial images were obtained from the base of the skull through the vertex without intravenous contrast. COMPARISON:  03/20/2017 CT head FINDINGS: Brain: No evidence of acute infarction, hemorrhage, hydrocephalus, extra-axial collection or mass lesion/mass effect. The chronic lacunar infarcts are present within the bilateral caudate heads, bilateral putamen, and left caudate body/ anterior corona radiata. Additionally, there are small chronic periventricular infarcts within the bilateral frontal lobes. Vascular: The calcific atherosclerosis of carotid siphons. No hyperdense vessel identified. Skull: Normal. Negative for fracture or focal lesion. Sinuses/Orbits: No acute finding. Other: None. ASPECTS [facility] Stroke Program Early CT Score) - Ganglionic level infarction (caudate, lentiform nuclei, internal capsule, insula, M1-M3 cortex): 7 - Supraganglionic infarction (M4-M6 cortex): 3 Total score (0-10 with 10 being normal): 10 IMPRESSION: 1. No acute intracranial abnormality identified. 2. ASPECTS is 10 3. Multiple stable chronic lacunar infarcts in basal ganglia and periventricular white matter. These results were called by telephone at the time of interpretation on 04/12/2017 at 1:08 am to Dr. Jaci Carrel , who verbally acknowledged these results. Electronically Signed   By: Mitzi Hansen M.D.   On: 04/12/2017 01:10    Procedures Procedures (including critical care time)  Medications Ordered in ED Medications  iopamidol (ISOVUE-370) 76 % injection 100 mL (100 mLs Intravenous Contrast Given  04/12/17 0156)     Initial Impression / Assessment and Plan / ED Course  I have reviewed the triage vital signs and the nursing notes.  Pertinent labs & imaging results that were available during my care of the patient were reviewed by me and considered in my medical decision making (see chart for details).     Patient presents to the ER with sudden onset of left-sided neuro deficit. Examination does reveal diminished sensation and weakness on the left side. Patient had a left-sided stroke affecting his right arm and leg approximately 2 weeks ago.  Code stroke was initiated. Patient evaluated by teleneurology. TPA was not administered because of recent stroke. It was recommended that the patient have CT angiography of head and neck to rule out large thrombus that could be treated with interventional radiology. CT angiography has been performed and does not show any large clots.  Patient will therefore be admitted to the hospital for further stroke workup. Patient hypertensive here in the ER. Recommendations from neurology are permissive hypertension with systolic blood pressure between 160 and 180, diastolic  blood pressure below 105. Patient is within these parameters at this time.  CRITICAL CARE Performed by: Gilda Crease   Total critical care time: 30 minutes  Critical care time was exclusive of separately billable procedures and treating other patients.  Critical care was necessary to treat or prevent imminent or life-threatening deterioration.  Critical care was time spent personally by me on the following activities: development of treatment plan with patient and/or surrogate as well as nursing, discussions with consultants, evaluation of patient's response to treatment, examination of patient, obtaining history from patient or surrogate, ordering and performing treatments and interventions, ordering and review of laboratory studies, ordering and review of radiographic  studies, pulse oximetry and re-evaluation of patient's condition.   Final Clinical Impressions(s) / ED Diagnoses   Final diagnoses:  Cerebrovascular accident (CVA), unspecified mechanism [facility])    New Prescriptions New Prescriptions   No medications on file     Gilda Crease, MD 04/12/17 [phone]

## 2017-04-12 NOTE — Evaluation (Signed)
Occupational Therapy Evaluation Patient Details Name: Brendan Charles MRN: [id number] DOB: 1956-05-11 Today's Date: 04/12/2017    History of Present Illness Brendan Charles is a 61 y.o. male with history of stroke recently discharged 2 weeks ago presents to the ER with complaints of sudden onset of numbness and weakness of the left upper extremity around 9:30 PM last night (04/11/2017). Patient denies any difficulty speaking and swallowing or any visual symptoms. Patient was admitted 2 weeks ago for stroke with right-sided weakness and at that time patient had stroke workup and was placed on aspirin and Plavix and statins. Patient states he has been taking his medications. MRI shows acute right medullary infarct.    Clinical Impression   Pt received supine in bed, agreeable to OT evaluation; son present for evaluation. OT familiar with pt from prior admission 2 weeks ago. [facility]. Pt reports he has been completing ADL tasks with supervision for safety and using RW for functional mobility. During evaluation pt demonstrating significant LLE and LUE weakness as well as coordination deficits; reports numbness and tingling in LUE however light touch sensation is intact. Pt with fair sitting balance, able to maintain upright position with BUE support, exhibits left posterior lean which he is able to self-correct with cuing. Pt requiring increased level of assistance for ADL completion due to left side deficits, requiring mod assist for transfer tasks and functional mobility with RW due to weakness. Recommend CIR to improve strength, independence and safety during B/ADL completion and functional mobility tasks. Pt is motivated to regain strength and independence and is agreeable to this venue if he qualifies.    Follow Up Recommendations  CIR    Equipment Recommendations  None recommended by OT    Recommendations for Other Services Rehab  consult     Precautions / Restrictions Precautions Precautions: Fall Restrictions Weight Bearing Restrictions: No      Mobility Bed Mobility Overal bed mobility: Needs Assistance Bed Mobility: Supine to Sit     Supine to sit: Min assist;HOB elevated     General bed mobility comments: Assistance required for trunk control and to push to EOB  Transfers Overall transfer level: Needs assistance Equipment used: Rolling walker (2 wheeled) Transfers: Sit to/from [employer] CorporationStand;Stand Pivot Transfers Sit to Stand: Min assist;From elevated surface Stand pivot transfers: Mod assist       General transfer comment: Cuing for hand placement, mod assist to support left side during transfer        ADL either performed or assessed with clinical judgement   ADL Overall ADL's : Needs assistance/impaired Eating/Feeding: Set up;Sitting Eating/Feeding Details (indicate cue type and reason): Pt requiring assistance for opening items due to limited strength and coordination of LUE. Pt is able to use both hands to grasp coffee and bring to mouth. Feeding with dominant RUE with minimal difficulty due to residual coordination deficits from prior CVA Grooming: Wash/dry hands;Wash/dry face;Sitting Grooming Details (indicate cue type and reason): Difficulty with strength and coordination of task with LUE. Unable to complete in standing due to LLE weakness and balance deficits Upper Body Bathing: Moderate assistance;Sitting   Lower Body Bathing: Maximal assistance;Sitting/lateral leans Lower Body Bathing Details (indicate cue type and reason): Pt limited due to sitting balance deficits-able to sit upright at EOB with BUE support and cuing for trunk control. Exhibits left posterior lean without BUE support Upper Body Dressing : Maximal assistance;Sitting Upper Body Dressing Details (indicate cue type and reason): Assist  for threading arms and managing buttons/ties Lower Body Dressing: Maximal  assistance;Sitting/lateral leans Lower Body Dressing Details (indicate cue type and reason): Requiring assistance due to sitting balance deficits. OT providing assist with socks, cuing pt for seated balance and trunk control, son present behind pt for safety Toilet Transfer: Moderate assistance;Cueing for safety;Cueing for sequencing;Stand-pivot;BSC;RW Toilet Transfer Details (indicate cue type and reason): Pt requiring assistance due to LLE and LUE weakness, able to grasp RW with LUE and provide limited support. Moderate assistance provided due to LLE weakness and buckling Toileting- Clothing Manipulation and Hygiene: Maximal assistance;Sitting/lateral lean         General ADL Comments: Pt requiring increased support for all ADL tasks due to left sided weakness, coordination, and balance deficits.      Vision Baseline Vision/History: No visual deficits Patient Visual Report: No change from baseline Vision Assessment?: No apparent visual deficits            Pertinent Vitals/Pain Pain Assessment: No/denies pain     Hand Dominance Right   Extremity/Trunk Assessment Upper Extremity Assessment Upper Extremity Assessment: RUE deficits/detail;LUE deficits/detail RUE Deficits / Details: strength 4-/5, grip is WFL, coordination is WFL, sensation is intact LUE Deficits / Details: LUE strength 2+/5, grip is poor LUE Coordination: decreased fine motor;decreased gross motor   Lower Extremity Assessment Lower Extremity Assessment: Defer to PT evaluation       Communication Communication Communication: Other (comment) (slurred speech)   Cognition Arousal/Alertness: Awake/alert Behavior During Therapy: WFL for tasks assessed/performed Overall Cognitive Status: Within Functional Limits for tasks assessed                                                Home Living Family/patient expects to be discharged to:: Private residence Living Arrangements: Children  (son) Available Help at Discharge: Family;Available 24 hours/day Type of Home: House Home Access: Stairs to enter [employer] of Steps: 10 Entrance Stairs-Rails: Right;Left;Can reach both Home Layout: Two level;Able to live on main level with bedroom/bathroom;Bed/bath upstairs Alternate Level Stairs-Number of Steps: full flight  Alternate Level Stairs-Rails: Right Bathroom Shower/Tub: Producer, television/film/video: Standard     Home Equipment: Environmental consultant - standard          Prior Functioning/Environment Level of Independence: Needs assistance  Gait / Transfers Assistance Needed: Since admission for CVA 2 weeks ago pt has been using RW for functional mobility purposes ADL's / Homemaking Assistance Needed: Pt has been completing ADL tasks with supervision for safety.    Comments: Pt receiving PT/OT/RN services from [facility], reports this Friday was supposed to be his last day with them        OT Problem List: Decreased strength;Decreased activity tolerance;Impaired balance (sitting and/or standing);Decreased coordination;Decreased safety awareness;Decreased knowledge of use of DME or AE;Impaired UE functional use      OT Treatment/Interventions: Self-care/ADL training;Therapeutic exercise;Neuromuscular education;DME and/or AE instruction;Therapeutic activities;Patient/family education    OT Goals(Current goals can be found in the care plan section) Acute Rehab OT Goals Patient Stated Goal: To get better  OT Goal Formulation: With patient Time For Goal Achievement: 04/26/17 Potential to Achieve Goals: Good  OT Frequency: Min 2X/week    AM-PAC PT "6 Clicks" Daily Activity     Outcome Measure Help from another person eating meals?: A Little Help from another person taking care of personal grooming?: A Little  Help from another person toileting, which includes using toliet, bedpan, or urinal?: A Lot Help from another person bathing (including washing,  rinsing, drying)?: A Lot Help from another person to put on and taking off regular upper body clothing?: A Lot Help from another person to put on and taking off regular lower body clothing?: A Lot 6 Click Score: 14   End of Session Equipment Utilized During Treatment: Gait belt;Rolling walker Nurse Communication: Mobility status  Activity Tolerance: Patient tolerated treatment well Patient left: in chair;with call bell/phone within reach;with nursing/sitter in room;with family/visitor present  OT Visit Diagnosis: Muscle weakness (generalized) (M62.81);Hemiplegia and hemiparesis Hemiplegia - Right/Left: Left Hemiplegia - dominant/non-dominant: Non-Dominant Hemiplegia - caused by: Cerebral infarction                Time: 4098-1191 OT Time Calculation (min): 24 min Charges:  OT General Charges $OT Visit: 1 Visit OT Evaluation $OT Eval Low Complexity: 1 Low G-Codes: OT G-codes **NOT FOR INPATIENT CLASS** Functional Assessment Tool Used: AM-PAC 6 Clicks Daily Activity Functional Limitation: Self care Self Care Current Status (Y7829): At least 40 percent but less than 60 percent impaired, limited or restricted Self Care Goal Status (F6213): At least 40 percent but less than 60 percent impaired, limited or restricted    Ezra Sites, OTR/L  [phone] 04/12/2017, 8:51 AM

## 2017-04-12 NOTE — Progress Notes (Signed)
Code stroke  Beeper  1234am In ct  1243 Exam finished 1247 Soc  1247 Completed 1247 Rad called 1249

## 2017-04-12 NOTE — Progress Notes (Signed)
Patient seen and examined  61 y.o. male with history of stroke recently discharged 8/14   presents to the ER with complaints of sudden onset of numbness and weakness of the left upper extremity around 9:30 PM last night (04/11/2017). Patient denies any difficulty speaking and swallowing or any visual symptoms. Recently admitted for stroke with right-sided weakness and at that time patient had stroke workup and was placed on aspirin and Plavix and statins. Patient states he has been taking his medications.   in the ER on exam patient has left upper extremity pronator drift with weakness. CT of the head did not show anything acute. Tele-neurology consult was obtained. At this time they recommended no thrombolytics since patient had recent stroke and advised to get CT angiogram of the head and neck to see if there is any vascular intervention to be done. CT angiogram of the head and neck did not show any lesion that can be intervened . Patient is being admitted for further management of possible stroke. Continue aspirin, Plavix  Patient recently also had cystoscopy with stent placement on the left on 8/16 which was 2 days after his stroke Reconsult neurology Small acute right medullary infarct with no associated hemorrhage or mass effect. This is likely associated with the severe distal right vertebral artery stenosis demonstrated today by CTA. Appreciate neurology recommendations

## 2017-04-12 NOTE — ED Notes (Signed)
Report to [place], RN 300

## 2017-04-12 NOTE — Progress Notes (Addendum)
Inpatient Rehabilitation  Per therapy request, patient was screened by Fae PippinMelissa Haileyann Staiger for appropriateness for an Inpatient Acute Rehab consult.  At this time we are recommending an Inpatient Rehab consult and have placed an order.  Plan to follow up with paient and/or family.  Will follow for timing of medical readiness, insurance authorization, and IP Rehab bed availability.  Please call with questions.   Update: Patient gave permission for me to speak with his son, I have called son [name] and left a message.    Charlane FerrettiMelissa Violanda Bobeck, M.A., CCC/SLP Admission Coordinator  [facility]  Cell [phone]

## 2017-04-12 NOTE — Care Management Note (Signed)
Case Management Note  Patient Details  Name: Brendan Charles MRN: [id number] Date of Birth: [birth year]  Subjective/Objective:                  Admitted with CVA. Pt is from home, lives with son who is with him 24/7. He has RW and active with [facility] pta. SLP and OT have recommended CIR, PT recommends SNF. Pt is very interested in CIR. He will also agree to SNF if CIR not an option.   Action/Plan: CSW made aware of recommendations and pt's wishes. [facility]  Expected Discharge Date:  04/13/17               Expected Discharge Plan:  IP Rehab Facility  In-House Referral:  Clinical Social Work  Discharge planning Services  CM Consult  Post Acute Care Choice:  IP Rehab Choice offered to:  Patient  Status of Service:  In process, will continue to follow  Malcolm MetroChildress, Chesni Vos Demske, RN 04/12/2017, 2:29 PM

## 2017-04-12 NOTE — Evaluation (Signed)
Speech Language Pathology Evaluation Patient Details Name: Brendan GreenhouseDouglas B Hermiz MRN: [id number] DOB: 08/25/1955 Today's Date: 04/12/2017 Time: [id number] SLP Time Calculation (min) (ACUTE ONLY): 24 min  Problem List:  Patient Active Problem List   Diagnosis Date Noted  . Acute ischemic stroke (HCC) 04/12/2017  . Stroke (cerebrum) (HCC) 04/12/2017  . Acute CVA (cerebrovascular accident) (HCC) 04/12/2017  . CVA (cerebral vascular accident) (HCC) 03/20/2017  . Hydronephrosis, left 03/20/2017  . Aortic atherosclerosis (HCC) 03/20/2017  . Hepatitis C 02/14/2017  . Encounter for screening colonoscopy 02/14/2017  . Spondylolisthesis at L3-L4 level 06/03/2014   Past Medical History:  Past Medical History:  Diagnosis Date  . Anxiety   . Chronic back pain   . CVA (cerebral vascular accident) (HCC) 03/20/2017  . Hepatitis C    HEP C, never treated, 2007   Past Surgical History:  Past Surgical History:  Procedure Laterality Date  . CERVICAL DISC SURGERY  2001   anterior  . COLONOSCOPY WITH PROPOFOL N/A 03/14/2017   Procedure: COLONOSCOPY WITH PROPOFOL;  Surgeon: West BaliFields, Sandi L, MD;  Location: [facility];  Service: Endoscopy;  Laterality: N/A;  8:15am  . CYSTOSCOPY W/ URETERAL STENT PLACEMENT Left 03/23/2017   Procedure: CYSTOSCOPY WITH LEFT RETROGRADE PYELOGRAM/URETERAL LEFT STENT PLACEMENT;  Surgeon: Malen GauzeMcKenzie, Patrick L, MD;  Location: [facility] ORS;  Service: Urology;  Laterality: Left;  . LUMBAR SPINE SURGERY  2015  . POLYPECTOMY  03/14/2017   Procedure: POLYPECTOMY;  Surgeon: West BaliFields, Sandi L, MD;  Location: [facility];  Service: Endoscopy;;  rectum   HPI:  Brendan Charles is a 61 y.o. male with history of stroke recently discharged 2 weeks ago presents to the ER with complaints of sudden onset of numbness and weakness of the left upper extremity around 9:30 PM last night (04/11/2017). Patient denies any difficulty speaking and swallowing or any visual symptoms. Patient was admitted 2 weeks  ago for stroke with right-sided weakness and at that time patient had stroke workup and was placed on aspirin and Plavix and statins. Patient states he has been taking his medications. MRI shows acute right medullary infarct.   Assessment / Plan / Recommendation Clinical Impression  Pt known from recent admission in August for acute stroke, however Pt presents with moderate expressive speech/language deficits characterized by moderate dysarthria, difficulty with thought organization, and mild word retrieval deficits in conversation which may be negatively impacted by poor sleep over night. Pt will benefit from skilled SLP in order to address the above impairments, maximize independence, and decrease burden of care. Pt reportedly passed RN swallow screen in ED, however Pt c/o difficulty masticating breakfast this AM. Given location of stroke (medullary) and Pt self report of dysphagia, will ask for BSE order.     SLP Assessment  SLP Recommendation/Assessment: Patient needs continued Speech Lanaguage Pathology Services SLP Visit Diagnosis: Dysarthria and anarthria (R47.1);Cognitive communication deficit (R41.841)    Follow Up Recommendations  Inpatient Rehab    Frequency and Duration min 2x/week  1 week      SLP Evaluation Cognition  Overall Cognitive Status: Within Functional Limits for tasks assessed Arousal/Alertness: Awake/alert Orientation Level: Oriented X4 Memory: Appears intact Awareness: Appears intact Problem Solving: Appears intact Safety/Judgment: Appears intact       Comprehension  Auditory Comprehension Overall Auditory Comprehension: Appears within functional limits for tasks assessed Yes/No Questions: Within Functional Limits Commands: Within Functional Limits (delays with execution) Conversation: Complex Interfering Components: Processing speed (Pt also reports being tired and yawned frequently) EffectiveTechniques: Extra processing  time;Repetition Visual  Recognition/Discrimination Discrimination: Within Function Limits Reading Comprehension Reading Status: Not tested    Expression Expression Primary Mode of Expression: Verbal Verbal Expression Overall Verbal Expression: Impaired Initiation: No impairment Automatic Speech: Name;Month of year;Social Response Level of Generative/Spontaneous Verbalization: Conversation Repetition: Impaired Level of Impairment: Sentence level Naming: Impairment Responsive: 76-100% accurate Confrontation: Within functional limits Convergent: Not tested Divergent: 25-49% accurate Pragmatics: No impairment Interfering Components: Speech intelligibility Non-Verbal Means of Communication: Not applicable Written Expression Dominant Hand: Right Written Expression: Not tested   Oral / Motor  Oral Motor/Sensory Function Overall Oral Motor/Sensory Function: Mild impairment Facial ROM: Within Functional Limits Facial Symmetry: Within Functional Limits Facial Strength: Within Functional Limits Facial Sensation: Within Functional Limits Lingual ROM: Within Functional Limits Lingual Symmetry: Within Functional Limits Lingual Strength: Reduced;Suspected CN XII (hypoglossal) dysfunction Lingual Sensation: Within Functional Limits Velum: Within Functional Limits Mandible: Within Functional Limits Motor Speech Overall Motor Speech: Impaired Respiration: Within functional limits Phonation: Normal;Breathy Resonance: Within functional limits Articulation: Impaired Level of Impairment: Word Intelligibility: Intelligibility reduced Word: 75-100% accurate Phrase: 50-74% accurate Sentence: 50-74% accurate Conversation: 50-74% accurate Motor Planning: Impaired Level of Impairment:  (suspect more due to dysarthria, but some repetitions noted) Motor Speech Errors: Inconsistent Effective Techniques: Slow rate;Over-articulate;Pause   Thank you,  Havery Moros, CCC-SLP [phone]                      PORTER,DABNEY 04/12/2017, 10:08 AM

## 2017-04-12 NOTE — Progress Notes (Addendum)
Patient's son, Candace GallusJoseph Belflower, would like to talk to someone about legally becoming his father's POA. -Social/case management.

## 2017-04-13 ENCOUNTER — Inpatient Hospital Stay (HOSPITAL_COMMUNITY)
Admission: RE | Admit: 2017-04-13 | Discharge: 2017-05-12 | DRG: 057 | Disposition: A | Discharge status: Home Health | Payer: Medicare Other | Source: Intra-hospital | Attending: Physical Medicine & Rehabilitation | Admitting: Physical Medicine & Rehabilitation

## 2017-04-13 ENCOUNTER — Encounter (HOSPITAL_COMMUNITY): Payer: Self-pay

## 2017-04-13 DIAGNOSIS — R269 Unspecified abnormalities of gait and mobility: Secondary | ICD-10-CM | POA: Diagnosis not present

## 2017-04-13 DIAGNOSIS — B1920 Unspecified viral hepatitis C without hepatic coma: Secondary | ICD-10-CM | POA: Diagnosis present

## 2017-04-13 DIAGNOSIS — R7881 Bacteremia: Secondary | ICD-10-CM | POA: Diagnosis present

## 2017-04-13 DIAGNOSIS — F419 Anxiety disorder, unspecified: Secondary | ICD-10-CM | POA: Diagnosis present

## 2017-04-13 DIAGNOSIS — Z7982 Long term (current) use of aspirin: Secondary | ICD-10-CM

## 2017-04-13 DIAGNOSIS — Z79899 Other long term (current) drug therapy: Secondary | ICD-10-CM

## 2017-04-13 DIAGNOSIS — F329 Major depressive disorder, single episode, unspecified: Secondary | ICD-10-CM | POA: Diagnosis present

## 2017-04-13 DIAGNOSIS — Z885 Allergy status to narcotic agent status: Secondary | ICD-10-CM

## 2017-04-13 DIAGNOSIS — G8192 Hemiplegia, unspecified affecting left dominant side: Secondary | ICD-10-CM | POA: Diagnosis not present

## 2017-04-13 DIAGNOSIS — I69354 Hemiplegia and hemiparesis following cerebral infarction affecting left non-dominant side: Principal | ICD-10-CM

## 2017-04-13 DIAGNOSIS — I69398 Other sequelae of cerebral infarction: Secondary | ICD-10-CM | POA: Diagnosis not present

## 2017-04-13 DIAGNOSIS — I639 Cerebral infarction, unspecified: Secondary | ICD-10-CM | POA: Diagnosis not present

## 2017-04-13 DIAGNOSIS — E785 Hyperlipidemia, unspecified: Secondary | ICD-10-CM | POA: Diagnosis present

## 2017-04-13 DIAGNOSIS — R531 Weakness: Secondary | ICD-10-CM

## 2017-04-13 DIAGNOSIS — I10 Essential (primary) hypertension: Secondary | ICD-10-CM | POA: Diagnosis present

## 2017-04-13 DIAGNOSIS — I471 Supraventricular tachycardia: Secondary | ICD-10-CM | POA: Diagnosis not present

## 2017-04-13 DIAGNOSIS — F4321 Adjustment disorder with depressed mood: Secondary | ICD-10-CM | POA: Diagnosis not present

## 2017-04-13 DIAGNOSIS — I69352 Hemiplegia and hemiparesis following cerebral infarction affecting left dominant side: Secondary | ICD-10-CM | POA: Diagnosis not present

## 2017-04-13 DIAGNOSIS — I63311 Cerebral infarction due to thrombosis of right middle cerebral artery: Secondary | ICD-10-CM | POA: Insufficient documentation

## 2017-04-13 DIAGNOSIS — F4322 Adjustment disorder with anxiety: Secondary | ICD-10-CM | POA: Diagnosis present

## 2017-04-13 DIAGNOSIS — N390 Urinary tract infection, site not specified: Secondary | ICD-10-CM | POA: Diagnosis present

## 2017-04-13 DIAGNOSIS — I69322 Dysarthria following cerebral infarction: Secondary | ICD-10-CM

## 2017-04-13 DIAGNOSIS — I63512 Cerebral infarction due to unspecified occlusion or stenosis of left middle cerebral artery: Secondary | ICD-10-CM | POA: Diagnosis not present

## 2017-04-13 DIAGNOSIS — R509 Fever, unspecified: Secondary | ICD-10-CM

## 2017-04-13 DIAGNOSIS — Z7902 Long term (current) use of antithrombotics/antiplatelets: Secondary | ICD-10-CM | POA: Diagnosis not present

## 2017-04-13 DIAGNOSIS — G8929 Other chronic pain: Secondary | ICD-10-CM | POA: Diagnosis present

## 2017-04-13 DIAGNOSIS — B9620 Unspecified Escherichia coli [E. coli] as the cause of diseases classified elsewhere: Secondary | ICD-10-CM | POA: Diagnosis present

## 2017-04-13 DIAGNOSIS — Z87891 Personal history of nicotine dependence: Secondary | ICD-10-CM | POA: Diagnosis not present

## 2017-04-13 DIAGNOSIS — A419 Sepsis, unspecified organism: Secondary | ICD-10-CM | POA: Diagnosis not present

## 2017-04-13 DIAGNOSIS — R1310 Dysphagia, unspecified: Secondary | ICD-10-CM | POA: Diagnosis present

## 2017-04-13 DIAGNOSIS — I69351 Hemiplegia and hemiparesis following cerebral infarction affecting right dominant side: Secondary | ICD-10-CM | POA: Diagnosis not present

## 2017-04-13 DIAGNOSIS — N3001 Acute cystitis with hematuria: Secondary | ICD-10-CM | POA: Diagnosis not present

## 2017-04-13 DIAGNOSIS — G8194 Hemiplegia, unspecified affecting left nondominant side: Secondary | ICD-10-CM | POA: Diagnosis not present

## 2017-04-13 DIAGNOSIS — G8191 Hemiplegia, unspecified affecting right dominant side: Secondary | ICD-10-CM | POA: Diagnosis not present

## 2017-04-13 LAB — CBC
HCT: 42.3 % (ref 39.0–52.0)
HCT: 43.5 % (ref 39.0–52.0)
HEMOGLOBIN: 14.6 g/dL (ref 13.0–17.0)
Hemoglobin: 14.5 g/dL (ref 13.0–17.0)
MCH: 31.1 pg (ref 26.0–34.0)
MCH: 30.4 pg (ref 26.0–34.0)
MCHC: 34.3 g/dL (ref 30.0–36.0)
MCHC: 33.6 g/dL (ref 30.0–36.0)
MCV: 90.8 fL (ref 78.0–100.0)
MCV: 90.6 fL (ref 78.0–100.0)
PLATELETS: 334 10*3/uL (ref 150–400)
Platelets: 321 10*3/uL (ref 150–400)
RBC: 4.66 MIL/uL (ref 4.22–5.81)
RBC: 4.8 MIL/uL (ref 4.22–5.81)
RDW: 13.6 % (ref 11.5–15.5)
RDW: 13.6 % (ref 11.5–15.5)
WBC: 10.1 10*3/uL (ref 4.0–10.5)
WBC: 10.9 10*3/uL — AB (ref 4.0–10.5)

## 2017-04-13 LAB — CREATININE, SERUM
CREATININE: 0.85 mg/dL (ref 0.61–1.24)
GFR calc non Af Amer: >60 mL/min (ref >60)

## 2017-04-13 LAB — COMPREHENSIVE METABOLIC PANEL
ALBUMIN: 4.2 g/dL (ref 3.5–5.0)
ALT: 36 U/L (ref 17–63)
ANION GAP: 9 (ref 5–15)
AST: 31 U/L (ref 15–41)
Alkaline Phosphatase: 98 U/L (ref 38–126)
BUN: 10 mg/dL (ref 6–20)
CALCIUM: 9.2 mg/dL (ref 8.9–10.3)
CHLORIDE: 106 mmol/L (ref 101–111)
CO2: 24 mmol/L (ref 22–32)
Creatinine, Ser: 0.82 mg/dL (ref 0.61–1.24)
GFR calc non Af Amer: >60 mL/min (ref >60)
Glucose, Bld: 108 mg/dL — ABNORMAL HIGH (ref 65–99)
POTASSIUM: 3.7 mmol/L (ref 3.5–5.1)
SODIUM: 139 mmol/L (ref 135–145)
Total Bilirubin: 0.8 mg/dL (ref 0.3–1.2)
Total Protein: 7 g/dL (ref 6.5–8.1)

## 2017-04-13 LAB — URINALYSIS, ROUTINE W REFLEX MICROSCOPIC
PH: 6.5 (ref 5.0–8.0)
Specific Gravity, Urine: 1.025 (ref 1.005–1.030)

## 2017-04-13 MED ORDER — ASPIRIN 325 MG PO TABS
325.0000 mg | ORAL_TABLET | Freq: Every day | ORAL | Status: DC
  Administered 2017-04-14 – 2017-05-12 (×29): 325 mg via ORAL
  Filled 2017-04-13 (×29): qty 1

## 2017-04-13 MED ORDER — AMLODIPINE BESYLATE 5 MG PO TABS
5.0000 mg | ORAL_TABLET | Freq: Every day | ORAL | Status: DC
  Administered 2017-04-13: 5 mg via ORAL
  Filled 2017-04-13: qty 1

## 2017-04-13 MED ORDER — ACETAMINOPHEN 325 MG PO TABS
650.0000 mg | ORAL_TABLET | ORAL | Status: DC | PRN
  Administered 2017-04-26 – 2017-04-29 (×2): 650 mg via ORAL
  Filled 2017-04-13 (×2): qty 2

## 2017-04-13 MED ORDER — SORBITOL 70 % SOLN
30.0000 mL | Freq: Every day | Status: DC | PRN
  Administered 2017-04-13: 30 mL via ORAL
  Filled 2017-04-13: qty 30

## 2017-04-13 MED ORDER — ALPRAZOLAM 0.25 MG PO TABS
0.5000 mg | ORAL_TABLET | Freq: Three times a day (TID) | ORAL | Status: DC | PRN
  Administered 2017-04-19 – 2017-05-10 (×16): 0.5 mg via ORAL
  Filled 2017-04-13 (×16): qty 2

## 2017-04-13 MED ORDER — OXYCODONE-ACETAMINOPHEN 7.5-325 MG PO TABS: 1.0000 | ORAL_TABLET | Freq: Four times a day (QID) | ORAL | Status: DC | PRN

## 2017-04-13 MED ORDER — CLOPIDOGREL BISULFATE 75 MG PO TABS
75.0000 mg | ORAL_TABLET | Freq: Every day | ORAL | Status: DC
  Administered 2017-04-14 – 2017-05-12 (×29): 75 mg via ORAL
  Filled 2017-04-13 (×29): qty 1

## 2017-04-13 MED ORDER — ENOXAPARIN SODIUM 40 MG/0.4ML ~~LOC~~ SOLN
40.0000 mg | SUBCUTANEOUS | Status: DC
  Administered 2017-04-14 – 2017-05-11 (×28): 40 mg via SUBCUTANEOUS
  Filled 2017-04-13 (×28): qty 0.4

## 2017-04-13 MED ORDER — AMLODIPINE BESYLATE 5 MG PO TABS
5.0000 mg | ORAL_TABLET | Freq: Every day | ORAL | Status: DC
  Administered 2017-04-14 – 2017-04-26 (×13): 5 mg via ORAL
  Filled 2017-04-13 (×13): qty 1

## 2017-04-13 MED ORDER — ACETAMINOPHEN 160 MG/5ML PO SOLN: 650.0000 mg | ORAL | Status: DC | PRN

## 2017-04-13 MED ORDER — HYDROCODONE-ACETAMINOPHEN 10-325 MG PO TABS: 1.0000 | ORAL_TABLET | Freq: Four times a day (QID) | ORAL | 0 refills | Status: DC | PRN

## 2017-04-13 MED ORDER — AMLODIPINE BESYLATE 5 MG PO TABS: 5.0000 mg | ORAL_TABLET | Freq: Every day | ORAL | 2 refills | Status: DC

## 2017-04-13 MED ORDER — ACETAMINOPHEN 650 MG RE SUPP: 650.0000 mg | RECTAL | Status: DC | PRN

## 2017-04-13 MED ORDER — TAMSULOSIN HCL 0.4 MG PO CAPS
0.4000 mg | ORAL_CAPSULE | Freq: Every day | ORAL | Status: DC
  Administered 2017-04-14 – 2017-05-12 (×29): 0.4 mg via ORAL
  Filled 2017-04-13 (×29): qty 1

## 2017-04-13 MED ORDER — ALPRAZOLAM 0.5 MG PO TABS: 0.5000 mg | ORAL_TABLET | Freq: Three times a day (TID) | ORAL | 0 refills | Status: DC | PRN

## 2017-04-13 MED ORDER — ATORVASTATIN CALCIUM 80 MG PO TABS
80.0000 mg | ORAL_TABLET | Freq: Every day | ORAL | Status: DC
  Administered 2017-04-13 – 2017-05-11 (×26): 80 mg via ORAL
  Filled 2017-04-13 (×29): qty 1

## 2017-04-13 MED ORDER — ONDANSETRON HCL 4 MG/2ML IJ SOLN: 4.0000 mg | Freq: Four times a day (QID) | INTRAMUSCULAR | Status: DC | PRN

## 2017-04-13 MED ORDER — ONDANSETRON HCL 4 MG PO TABS
4.0000 mg | ORAL_TABLET | Freq: Four times a day (QID) | ORAL | Status: DC | PRN
  Filled 2017-04-13: qty 1

## 2017-04-13 MED ORDER — ENOXAPARIN SODIUM 40 MG/0.4ML ~~LOC~~ SOLN: 40.0000 mg | SUBCUTANEOUS | Status: DC

## 2017-04-13 NOTE — Progress Notes (Signed)
[facility], Clydie BraunKaren.  Awaiting [facility] for transportation.

## 2017-04-13 NOTE — Progress Notes (Signed)
Occupational Therapy Treatment Patient Details Name: Brendan GreenhouseDouglas B Rhoda MRN: [id number] DOB: 1956-05-14 Today's Date: 04/13/2017    History of present illness Brendan GreenhouseDouglas B Speciale is a 61 y.o. male with history of stroke recently discharged 2 weeks ago presents to the ER with complaints of sudden onset of numbness and weakness of the left upper extremity around 9:30 PM last night (04/11/2017). Patient denies any difficulty speaking and swallowing or any visual symptoms. Patient was admitted 2 weeks ago for stroke with right-sided weakness and at that time patient had stroke workup and was placed on aspirin and Plavix and statins. Patient states he has been taking his medications. MRI positive for acute infarct.     OT comments  Pt received semi-reclined in bed, eating breakfast. Pt requiring assistance with opening containers and packets, volitionally utilizing BUE during feeding tasks. This am pt with improved sitting balance and ability to self-correct, initiated weight-bearing activities working on trunk control and BUE strength. Pt able to push up from left forearm with minimal assistance for posterior lean correction. Initiated BUE exercises this session, instructing pt in using RUE to assist LUE with AA/ROM. Pt with ROM WNL, strength deficits limiting functional use of LUE at this time. Continue to recommend CIR on discharge.    Follow Up Recommendations  CIR    Equipment Recommendations  None recommended by OT       Precautions / Restrictions Precautions Precautions: Fall       Mobility Bed Mobility Overal bed mobility: Needs Assistance Bed Mobility: Supine to Sit     Supine to sit: Min assist;HOB elevated     General bed mobility comments: Assistance required for trunk control and to push to EOB  Transfers Overall transfer level: Needs assistance Equipment used: Rolling walker (2 wheeled) Transfers: Sit to/from [employer]Stand;Stand Pivot Transfers Sit to Stand: Mod assist Stand pivot  transfers: Mod assist;Max assist       General transfer comment: Pt able to maintain standing for short periods with mod assist, max assist for stand-pivot transfer. Pt has significant difficulty with side-stepping, able to bear weight on LLE when stepping back with RLE. Verbal cuing for sequencing.         ADL either performed or assessed with clinical judgement   ADL Overall ADL's : Needs assistance/impaired Eating/Feeding: Set up;Bed level Eating/Feeding Details (indicate cue type and reason): Pt using BUE for feeding tasks this am, using RUE as dominant and LUE as assist. Requiring assistance for opening butter and bowl containing apples.                  Lower Body Dressing: Maximal assistance;Sitting/lateral leans Lower Body Dressing Details (indicate cue type and reason): Requiring assistance due to sitting balance deficits. OT providing assist with socks, cuing pt for seated balance and trunk control, improved sitting balance today                               Cognition Arousal/Alertness: Awake/alert Behavior During Therapy: WFL for tasks assessed/performed Overall Cognitive Status: Within Functional Limits for tasks assessed                                          Exercises Exercises: General Upper Extremity;Other exercises General Exercises - Upper Extremity Shoulder Flexion: AAROM;10 reps Shoulder Extension: AAROM;10 reps Elbow Flexion: AAROM;10 reps Elbow Extension:  AAROM;10 reps Wrist Flexion: AROM;10 reps Wrist Extension: AROM;10 reps Digit Composite Flexion: AROM;10 reps Composite Extension: AROM;10 reps Hand Exercises Opposition: AROM;10 reps Other Exercises Other Exercises: towel crumple working on coordination, 10X Other Exercises: weight-shifting/weight-bearing on forearms, 1 minute each direction, 2 trials.            Pertinent Vitals/ Pain       Pain Assessment: No/denies pain         Frequency  Min 2X/week         Progress Toward Goals  OT Goals(current goals can now be found in the care plan section)  Progress towards OT goals: Progressing toward goals  Acute Rehab OT Goals Patient Stated Goal: To get better and return home OT Goal Formulation: With patient Time For Goal Achievement: 04/26/17 Potential to Achieve Goals: Good ADL Goals Pt Will Perform Grooming: with min assist;standing Pt Will Perform Lower Body Dressing: with mod assist;sitting/lateral leans Pt Will Transfer to Toilet: with min assist;stand pivot transfer;ambulating;grab bars;regular height toilet Pt/caregiver will Perform Home Exercise Program: Increased strength;Left upper extremity;With minimal assist;With written HEP provided  Plan Discharge plan remains appropriate          End of Session Equipment Utilized During Treatment: Gait belt;Rolling walker  OT Visit Diagnosis: Muscle weakness (generalized) (M62.81);Hemiplegia and hemiparesis Hemiplegia - Right/Left: Left Hemiplegia - dominant/non-dominant: Non-Dominant Hemiplegia - caused by: Cerebral infarction   Activity Tolerance Patient tolerated treatment well   Patient Left in chair;with call bell/phone within reach           Time: 0826-0905 OT Time Calculation (min): 39 min  Charges: OT General Charges $OT Visit: 1 Visit OT Treatments $Therapeutic Activity: 8-22 mins $Therapeutic Exercise: 8-22 mins    Ezra Sites, OTR/L  [phone] 04/13/2017, 9:19 AM

## 2017-04-13 NOTE — Care Management Note (Signed)
Case Management Note  Patient Details  Name: Susanne GreenhouseDouglas B Brotz MRN: [id number] Date of Birth: 06/10/1956  Expected Discharge Date:  04/13/17               Expected Discharge Plan:  IP Rehab Facility  In-House Referral:  Clinical Social Work  Discharge planning Services  CM Consult  Post Acute Care Choice:  IP Rehab Choice offered to:  Patient  Status of Service:    completed, signing off.   If discussed at Long Length of Stay Meetings, dates discussed:    Additional Comments: Discharging to CIR today.   Malcolm Metrohildress, Jeneane Pieczynski Demske, RN 04/13/2017, 1:25 PM

## 2017-04-13 NOTE — Consult Note (Signed)
Brendan Haven A. Merlene Laughter, MD     [email or web address]          Brendan Charles is an 61 y.o. male.   ASSESSMENT/PLAN: 1.  Acute lacunar infarct involving the right medulla oblongata medial aspect. Risk factors previous infarct, nicotine use and age.   2. Subacute large vessel left MCA infarct due to intracranial stenosis:  RECOMMENDATION: The patient already is on maximum medical treatment with dual antiplatelet agents and a statin. I think we should continue with this regimen. There is no justification at this time for interventional procedures. The patient is apparently due to have elective surgery without recommend holding off for at least a month until things stabilize this. Agree with intense physical therapy, occupational therapy and speech therapy.   The patient is a 61 year old right-handed white male who was just released from the hospital 2 weeks ago with right hemiparesis due to a left MCA infarct. He was placed in dual antiplatelet agents and statin. He tells me he has been compliant with this. Smoking cessation was also recommended and he tells me that he has been compliant with this. Unfortunately, the patient developed the acute onset of left-sided hemiparesis. Imaging shows a tiny infarct. It appears that his speech is also gotten worse since his more recent event. The patient was to have surgery on next week. I think this is at an abdominal procedure. The review systems otherwise negative.     Chief Complaint: Left upper extremity numbness and weakness.  HPI: Brendan Charles is a 61 y.o. male with history of stroke recently discharged 2 weeks ago presents to the ER with complaints of sudden onset of numbness and weakness of the left upper extremity around 9:30 PM last night (04/11/2017). Patient denies any difficulty speaking and swallowing or any visual symptoms. Patient was admitted 2 weeks ago for stroke with right-sided weakness and at that time  patient had stroke workup and was placed on aspirin and Plavix and statins. Patient states he has been taking his medications.      MY PRIOR NOTE  03-2017 Acute left MCA infarct due to large vessel intracranial arterial occlusive disease: Risk factors age and cigarette smoking - I recommended patient be placed on 325 mg aspirin Plavix combination for 3-6 months. Subsequently, he should be on single agent preferably aspirin. Statin is also recommended. Additional labs are recommended including HIV, homocysteine level and hemoglobin A1c and lipid panel. Speech, occupational and physical therapy are recommended. Smoking cessation discussed with the patient.     GENERAL: Pleasant thin male in no acute distress.  HEENT: Normal  ABDOMEN: soft  EXTREMITIES: No edema   BACK: Normal  SKIN: Normal by inspection.    MENTAL STATUS: He is awake and alert and follows commands well. Speech is in for a severely dysarthric. He is oriented however including pain oriented to location, time, month and age.  CRANIAL NERVES: Pupils are equal, round and reactive to light and accomodation; extra ocular movements are full, there is no significant nystagmus; visual fields are full; upper and lower facial muscles are normal in strength and symmetric, there is flattening of the nasolabial folds  R more than left; tongue is midline; uvula is midline; shoulder elevation is normal.  MOTOR: Right upper extremity strength graded as 4 minus/5 and the right lower extremity also 4 minus/5. There is a mild drift right leg but no drift of the right upper extremity. Left upper extremity strength graded as 3/5 deltoid triceps 4 and  associated with a significant drift. Left lower extremity 2/5. Bulk is normal but tone is increased on both sides.  COORDINATION: Left finger to nose is normal, right finger to nose shows mild dysmetria, No rest tremor; no intention tremor; no postural tremor; no bradykinesia.  REFLEXES: Deep  tendon reflexes are symmetrical and normal. Babinski reflexes are flexor bilaterally.   SENSATION: Normal to light touch, temperature, and pinprick.   NIH stroke scale 9.     The brain MRI is reviewed in person. There is a tiny increased signal seen on DWI involving the medial aspect of the right mid dual. Again, there is a large increased signal seen on DWI involving the left basal ganglia and extended to large regions of the centrum semiovale on the left side.       Blood pressure (!) 166/89, pulse 88, temperature 98 F (36.7 C), temperature source Oral, resp. rate 16, height _0  (1.803 m), weight 159 lb 13.7 oz (72.5 kg), SpO2 98 %.  Past Medical History:  Diagnosis Date  . Anxiety   . Chronic back pain   . CVA (cerebral vascular accident) ([place]) 03/20/2017  . Hepatitis C    HEP C, never treated, 2007    Past Surgical History:  Procedure Laterality Date  . CERVICAL DISC SURGERY  2001   anterior  . COLONOSCOPY WITH PROPOFOL N/A 03/14/2017   Procedure: COLONOSCOPY WITH PROPOFOL;  Surgeon: Danie Binder, MD;  Location: [facility];  Service: Endoscopy;  Laterality: N/A;  8:15am  . CYSTOSCOPY W/ URETERAL STENT PLACEMENT Left 03/23/2017   Procedure: CYSTOSCOPY WITH LEFT RETROGRADE PYELOGRAM/URETERAL LEFT STENT PLACEMENT;  Surgeon: Cleon Gustin, MD;  Location: [facility];  Service: Urology;  Laterality: Left;  . LUMBAR SPINE SURGERY  2015  . POLYPECTOMY  03/14/2017   Procedure: POLYPECTOMY;  Surgeon: Danie Binder, MD;  Location: [facility];  Service: Endoscopy;;  rectum    Family History  Problem Relation Age of Onset  . Cancer Mother   . Colon cancer Neg Hx     Social History:  reports that he has quit smoking. His smoking use included Cigarettes. He has a 25.00 pack-year smoking history. He has never used smokeless tobacco. He reports that he does not drink alcohol or use drugs.  Allergies:  Allergies  Allergen Reactions  . Codeine Itching and Nausea Only      Medications: Prior to Admission medications   Medication Sig Start Date End Date Taking? Authorizing Provider  ALPRAZolam Duanne Moron) 0.5 MG tablet Take 0.5 mg by mouth 3 (three) times daily as needed for anxiety.   Yes [provider]  aspirin 325 MG tablet Take 1 tablet (325 mg total) by mouth daily. 03/22/17  Yes Nita Sells, MD  atorvastatin (LIPITOR) 80 MG tablet Take 1 tablet (80 mg total) by mouth daily at 6 PM. 03/21/17  Yes Nita Sells, MD  clopidogrel (PLAVIX) 75 MG tablet Take 1 tablet (75 mg total) by mouth daily with breakfast. 03/22/17  Yes Nita Sells, MD  HYDROcodone-acetaminophen (NORCO) 10-325 MG tablet Take 1 tablet by mouth 4 (four) times daily.    Yes [provider]  ondansetron (ZOFRAN ODT) 8 MG disintegrating tablet Take 1 tablet (8 mg total) by mouth every 8 (eight) hours as needed for nausea or vomiting. 03/18/17  Yes Triplett, Tammy, PA-C  tamsulosin (FLOMAX) 0.4 MG CAPS capsule Take 1 capsule (0.4 mg total) by mouth daily. 03/23/17  Yes McKenzie, Candee Furbish, MD    Scheduled  Meds: . aspirin  325 mg Oral Daily  . atorvastatin  80 mg Oral q1800  . clopidogrel  75 mg Oral Q breakfast  . enoxaparin (LOVENOX) injection  40 mg Subcutaneous Q24H  . tamsulosin  0.4 mg Oral Daily   Continuous Infusions: . sodium chloride 50 mL/hr at 04/12/17 0646   PRN Meds:.acetaminophen **OR** acetaminophen (TYLENOL) oral liquid 160 mg/5 mL **OR** acetaminophen, ALPRAZolam, ondansetron, oxyCODONE-acetaminophen     Results for orders placed or performed during the hospital encounter of 04/12/17 (from the past 48 hour(s))  CBG monitoring, ED     Status: Abnormal   Collection Time: 04/12/17 12:40 AM  Result Value Ref Range   Glucose-Capillary 113 (H) 65 - 99 mg/dL  Ethanol     Status: None   Collection Time: 04/12/17 12:40 AM  Result Value Ref Range   Alcohol, Ethyl (B) <5 <5 mg/dL    Comment:        LOWEST DETECTABLE LIMIT FOR SERUM  ALCOHOL IS 5 mg/dL FOR MEDICAL PURPOSES ONLY   Protime-INR     Status: None   Collection Time: 04/12/17 12:40 AM  Result Value Ref Range   Prothrombin Time 12.6 11.4 - 15.2 seconds   INR 0.95   APTT     Status: None   Collection Time: 04/12/17 12:40 AM  Result Value Ref Range   aPTT 27 24 - 36 seconds  CBC     Status: Abnormal   Collection Time: 04/12/17 12:40 AM  Result Value Ref Range   WBC 11.2 (H) 4.0 - 10.5 K/uL   RBC 4.60 4.22 - 5.81 MIL/uL   Hemoglobin 14.0 13.0 - 17.0 g/dL   HCT 42.0 39.0 - 52.0 %   MCV 91.3 78.0 - 100.0 fL   MCH 30.4 26.0 - 34.0 pg   MCHC 33.3 30.0 - 36.0 g/dL   RDW 13.5 11.5 - 15.5 %   Platelets 382 150 - 400 K/uL  Differential     Status: None   Collection Time: 04/12/17 12:40 AM  Result Value Ref Range   Neutrophils Relative % 68 %   Neutro Abs 7.6 1.7 - 7.7 K/uL   Lymphocytes Relative 24 %   Lymphs Abs 2.7 0.7 - 4.0 K/uL   Monocytes Relative 7 %   Monocytes Absolute 0.7 0.1 - 1.0 K/uL   Eosinophils Relative 1 %   Eosinophils Absolute 0.1 0.0 - 0.7 K/uL   Basophils Relative 0 %   Basophils Absolute 0.0 0.0 - 0.1 K/uL  Comprehensive metabolic panel     Status: Abnormal   Collection Time: 04/12/17 12:40 AM  Result Value Ref Range   Sodium 137 135 - 145 mmol/L   Potassium 3.8 3.5 - 5.1 mmol/L   Chloride 104 101 - 111 mmol/L   CO2 24 22 - 32 mmol/L   Glucose, Bld 111 (H) 65 - 99 mg/dL   BUN 15 6 - 20 mg/dL   Creatinine, Ser 0.92 0.61 - 1.24 mg/dL   Calcium 9.1 8.9 - 10.3 mg/dL   Total Protein 7.4 6.5 - 8.1 g/dL   Albumin 4.4 3.5 - 5.0 g/dL   AST 24 15 - 41 U/L   ALT 30 17 - 63 U/L   Alkaline Phosphatase 103 38 - 126 U/L   Total Bilirubin 0.6 0.3 - 1.2 mg/dL   GFR calc non Af Amer >60 >60 mL/min   GFR calc Af Amer >60 >60 mL/min    Comment: (NOTE) The eGFR has been calculated using the  CKD EPI equation. This calculation has not been validated in all clinical situations. eGFR's persistently <60 mL/min signify possible Chronic  Kidney Disease.    Anion gap 9 5 - 15  I-Stat Chem 8, ED     Status: Abnormal   Collection Time: 04/12/17 12:46 AM  Result Value Ref Range   Sodium 143 135 - 145 mmol/L   Potassium 4.1 3.5 - 5.1 mmol/L   Chloride 107 101 - 111 mmol/L   BUN 17 6 - 20 mg/dL   Creatinine, Ser 0.80 0.61 - 1.24 mg/dL   Glucose, Bld 106 (H) 65 - 99 mg/dL   Calcium, Ion 1.08 (L) 1.15 - 1.40 mmol/L   TCO2 26 22 - 32 mmol/L   Hemoglobin 15.0 13.0 - 17.0 g/dL   HCT 44.0 39.0 - 52.0 %  I-stat troponin, ED     Status: None   Collection Time: 04/12/17 12:46 AM  Result Value Ref Range   Troponin i, poc 0.00 0.00 - 0.08 ng/mL   Comment 3            Comment: Due to the release kinetics of cTnI, a negative result within the first hours of the onset of symptoms does not rule out myocardial infarction with certainty. If myocardial infarction is still suspected, repeat the test at appropriate intervals.   Urine rapid drug screen (hosp performed)     Status: Abnormal   Collection Time: 04/12/17  2:05 AM  Result Value Ref Range   Opiates POSITIVE (A) NONE DETECTED   Cocaine NONE DETECTED NONE DETECTED   Benzodiazepines NONE DETECTED NONE DETECTED   Amphetamines NONE DETECTED NONE DETECTED   Tetrahydrocannabinol NONE DETECTED NONE DETECTED   Barbiturates NONE DETECTED NONE DETECTED    Comment:        DRUG SCREEN FOR MEDICAL PURPOSES ONLY.  IF CONFIRMATION IS NEEDED FOR ANY PURPOSE, NOTIFY LAB WITHIN 5 DAYS.        LOWEST DETECTABLE LIMITS FOR URINE DRUG SCREEN Drug Class       Cutoff (ng/mL) Amphetamine      1000 Barbiturate      200 Benzodiazepine   765 Tricyclics       465 Opiates          300 Cocaine          300 THC              50   Urinalysis, Routine w reflex microscopic     Status: Abnormal   Collection Time: 04/12/17  2:05 AM  Result Value Ref Range   Color, Urine RED (A) YELLOW    Comment: BIOCHEMICALS MAY BE AFFECTED BY COLOR CORRECTED ON 09/05 AT 0315: PREVIOUSLY REPORTED AS RED     APPearance CLOUDY (A) CLEAR   Specific Gravity, Urine  1.005 - 1.030    TEST NOT REPORTED DUE TO COLOR INTERFERENCE OF URINE PIGMENT    Comment: CORRECTED ON 09/05 AT 0315: PREVIOUSLY REPORTED AS BIOCHEMICALS MAY BE AFFECTED BY COLOR   pH  5.0 - 8.0    TEST NOT REPORTED DUE TO COLOR INTERFERENCE OF URINE PIGMENT    Comment: CORRECTED ON 09/05 AT 0315: PREVIOUSLY REPORTED AS BIOCHEMICALS MAY BE AFFECTED BY COLOR   Glucose, UA (A) NEGATIVE mg/dL    TEST NOT REPORTED DUE TO COLOR INTERFERENCE OF URINE PIGMENT    Comment: CORRECTED ON 09/05 AT 0315: PREVIOUSLY REPORTED AS BIOCHEMICALS MAY BE AFFECTED BY COLOR   Hgb urine dipstick (A) NEGATIVE    TEST  NOT REPORTED DUE TO COLOR INTERFERENCE OF URINE PIGMENT    Comment: CORRECTED ON 09/05 AT 0315: PREVIOUSLY REPORTED AS BIOCHEMICALS MAY BE AFFECTED BY COLOR   Bilirubin Urine (A) NEGATIVE    TEST NOT REPORTED DUE TO COLOR INTERFERENCE OF URINE PIGMENT    Comment: CORRECTED ON 09/05 AT 0315: PREVIOUSLY REPORTED AS BIOCHEMICALS MAY BE AFFECTED BY COLOR   Ketones, ur (A) NEGATIVE mg/dL    TEST NOT REPORTED DUE TO COLOR INTERFERENCE OF URINE PIGMENT    Comment: CORRECTED ON 09/05 AT 0315: PREVIOUSLY REPORTED AS BIOCHEMICALS MAY BE AFFECTED BY COLOR   Protein, ur (A) NEGATIVE mg/dL    TEST NOT REPORTED DUE TO COLOR INTERFERENCE OF URINE PIGMENT    Comment: CORRECTED ON 09/05 AT 0315: PREVIOUSLY REPORTED AS BIOCHEMICALS MAY BE AFFECTED BY COLOR   Nitrite (A) NEGATIVE    TEST NOT REPORTED DUE TO COLOR INTERFERENCE OF URINE PIGMENT    Comment: CORRECTED ON 09/05 AT 0315: PREVIOUSLY REPORTED AS BIOCHEMICALS MAY BE AFFECTED BY COLOR   Leukocytes, UA (A) NEGATIVE    TEST NOT REPORTED DUE TO COLOR INTERFERENCE OF URINE PIGMENT    Comment: CORRECTED ON 09/05 AT 0315: PREVIOUSLY REPORTED AS BIOCHEMICALS MAY BE AFFECTED BY COLOR  Urinalysis, Microscopic (reflex)     Status: Abnormal   Collection Time: 04/12/17  2:05 AM  Result Value Ref Range   RBC / HPF TOO  NUMEROUS TO COUNT 0 - 5 RBC/hpf   WBC, UA 6-30 0 - 5 WBC/hpf   Bacteria, UA RARE (A) NONE SEEN   Squamous Epithelial / LPF 0-5 (A) NONE SEEN   Urine-Other      ADMENDED REPORT CALLED TO [facility],R @ 0315 BY MATTHEWS, B 9.5.18 BIOCHEMICAL MAY BE AFFECTED BY COLOR CHANGED TO tEST NOT PERFORMED DUE TO COLOR INTERFERENCE  CBC     Status: None   Collection Time: 04/13/17  6:58 AM  Result Value Ref Range   WBC 10.1 4.0 - 10.5 K/uL   RBC 4.66 4.22 - 5.81 MIL/uL   Hemoglobin 14.5 13.0 - 17.0 g/dL   HCT 42.3 39.0 - 52.0 %   MCV 90.8 78.0 - 100.0 fL   MCH 31.1 26.0 - 34.0 pg   MCHC 34.3 30.0 - 36.0 g/dL   RDW 13.6 11.5 - 15.5 %   Platelets 334 150 - 400 K/uL  Comprehensive metabolic panel     Status: Abnormal   Collection Time: 04/13/17  6:58 AM  Result Value Ref Range   Sodium 139 135 - 145 mmol/L   Potassium 3.7 3.5 - 5.1 mmol/L   Chloride 106 101 - 111 mmol/L   CO2 24 22 - 32 mmol/L   Glucose, Bld 108 (H) 65 - 99 mg/dL   BUN 10 6 - 20 mg/dL   Creatinine, Ser 0.82 0.61 - 1.24 mg/dL   Calcium 9.2 8.9 - 10.3 mg/dL   Total Protein 7.0 6.5 - 8.1 g/dL   Albumin 4.2 3.5 - 5.0 g/dL   AST 31 15 - 41 U/L   ALT 36 17 - 63 U/L   Alkaline Phosphatase 98 38 - 126 U/L   Total Bilirubin 0.8 0.3 - 1.2 mg/dL   GFR calc non Af Amer >60 >60 mL/min   GFR calc Af Amer >60 >60 mL/min    Comment: (NOTE) The eGFR has been calculated using the CKD EPI equation. This calculation has not been validated in all clinical situations. eGFR's persistently <60 mL/min signify possible Chronic Kidney Disease.  Anion gap 9 5 - 15    Studies/Results:  BRAIN MRI: 1. Small acute right medullary infarct with no associated hemorrhage or mass effect. This is likely associated with the severe distal right vertebral artery stenosis demonstrated today by CTA. 2. Evolution of the left corona radiata/basal ganglia infarct since 03/20/2017 with no associated hemorrhage or mass effect. Underlying chronic cerebral white  matter and basal ganglia small vessel Ischemia.     Head and neck CTA: FINDINGS: CTA NECK FINDINGS  Aortic arch: Standard branching. Imaged portion shows no evidence of aneurysm or dissection. No significant stenosis of the major arch vessel origins.  Right carotid system: No evidence of dissection, stenosis (50% or greater) or occlusion.  Left carotid system: No evidence of dissection, stenosis (50% or greater) or occlusion.  Vertebral arteries: Codominant. No evidence of dissection, stenosis (50% or greater) or occlusion.  Skeleton: C5-C7 anterior cervical discectomy and fusion. Advanced cervical degenerative changes at the C3-4 level with severe disc space narrowing, a disc protrusion, and anterior marginal osteophytes.  Other neck: 13 mm nodule within right lobe of thyroid.  Upper chest: Negative.  Review of the MIP images confirms the above findings  CTA HEAD FINDINGS  Anterior circulation: No significant stenosis, proximal occlusion, aneurysm, or vascular malformation.  Posterior circulation: Tandem segments of severe stenosis of right vertebral artery.  Patent left vertebral artery, basilar artery, and bilateral posterior cerebral arteries. No large vessel occlusion, aneurysm, or significant stenosis is identified.  Venous sinuses: As permitted by contrast timing, patent.  Anatomic variants: Anterior communicating artery and probable diminutive bilateral posterior communicating arteries.  Delayed phase: No abnormal intracranial enhancement.  Review of the MIP images confirms the above findings  IMPRESSION: 1. Patent carotid and vertebral arteries in the neck. No dissection, aneurysm, or hemodynamically significant stenosis utilizing NASCET criteria. 2. Patent circle of Willis.  No large vessel occlusion or aneurysm.  3. Tandem segments of severe stenosis of right vertebral artery proximal to PICA origin. Otherwise no significant  intracranial arterial stenosis.     Echo: - Left ventricle: The cavity size was normal. Systolic function was   normal. The estimated ejection fraction was in the range of 60%   to 65%. Wall motion was normal; there were no regional wall   motion abnormalities. Left ventricular diastolic function   parameters were normal. - Atrial septum: No defect or patent foramen ovale was identified.  Impressions:  - No cardiac source of emboli was indentified.         Brain MRI/MRA 03/2017: IMPRESSION: 1. Acute nonhemorrhagic infarct of the left basal ganglia extending to the corona radiata measures 3.5 cm in cephalo caudad dimension. 2. Lacunar infarcts of the bilateral basal ganglia are remote. 3. High-grade stenosis or occlusion of the anterior inferior left M2 segment, likely related to the infarct territory. 4. Moderate to high-grade stenosis of the proximal right vertebral artery with distal flow beyond the PICA  ADDENDUM: 1.5 mm aneurysm or more likely infundibulum of the left posterior communicating artery. This could be followed with a MRA or CTA at 6-12 months.   The brain MRI is reviewed in person and shows a large vessel infarct involving the left deep brain matter approximately a large section of the centrum semiovale. This is a moderate size stroke seen on 7 cuts and associated with increased signal on DWI in reduced signal on the ADC scan. No microhemorrhages are appreciated. There are several bilateral basal ganglia lacunar infarcts with reduced signal seen on T1.  Chandelle Harkey A. Merlene Charles, M.D.  Diplomate, Tax adviser of Psychiatry and Neurology ( Neurology). 04/13/2017, 8:23 AM

## 2017-04-13 NOTE — Progress Notes (Signed)
Erick ColaceKirsteins, Andrew E, MD Physician Signed Physical Medicine and Rehabilitation  PMR Pre-admission Date of Service: 04/13/2017 1:04 PM  Related encounter: ED to Hosp-Admission (Discharged) [facility]       [] Hide copied text   Secondary Market PMR Admission Coordinator Pre-Admission Assessment  Patient: Brendan GreenhouseDouglas B Perris is an 61 y.o., male MRN: [id number] DOB: 16-Jul-1956 Height: 5\' 11"  (180.3 cm) Weight: 72.5 kg (159 lb 13.7 oz)  Insurance Information HMO: X    PPO:      PCP:      IPA:      80/20:      OTHER:  PRIMARY: U[employer] Medicare       Policy#: [id number]      Subscriber: Self CM Name: Rebeca AlertSunny Smith      Phone#: [phone]     Fax#: [phone] Pre-Cert#: [id number] 04/14/07-04/19/17     Employer: Retired  Benefits:  Phone #: Verified online     Name: UHC Online Eff. Date: 08/08/16     Deduct: $0      Out of Pocket Max: [phone]      Life Max: N/A CIR: $430 a day, days 1-4; $0 days 5+      SNF: $0 a day, days 1-20; $160 a day, days 21-62; $0 a day, days 63-100 Outpatient: PT/OT/SLP     Co-Pay: $40 per visit  Home Health: 100%      Co-Pay: None DME: 80%     Co-Pay: 20%  Providers: In-network   SECONDARY: None      Policy#:       Subscriber:  CM Name:       Phone#:      Fax#:  Pre-Cert#:       Employer:  Benefits:  Phone #:      Name:  Eff. Date:      Deduct:       Out of Pocket Max:       Life Max:  CIR:       SNF:  Outpatient:      Co-Pay:  Home Health:       Co-Pay:  DME:      Co-Pay:   Medicaid Application Date:       Case Manager:  Disability Application Date:       Case Worker:   Emergency Contact Information        Contact Information    Name Relation Home Work Mobile   OlatheBoyatt,Joseph Son [phone]  [phone]      Current Medical History  Patient Admitting Diagnosis: Acute lacunar infarct involving the right medulla oblongata medial aspect  History of Present Illness: 61 year old right-handed male with history  of tobacco abuse, hepatitis C and recent CVA with right hemiparesis due to left MCA infarct admitted 03/20/2017 discharge 03/21/2017 ambulating minimal assist to 100 feet using a rolling walker. Discharged on aspirin and Plavix. Per chart review patient lives with family. Was using a walker prior to admission. 2 level home with bath and bedroom on main level and 5 steps to entry of home. Family can assist as needed. He was able to complete ADL tasks with supervision for safety. He was receiving home therapies with advanced home care. Patient was doing well until 04/12/2017 with left-sided weakness and numbnessas well as severe dysarthria. CT/MRI showed small acute right medullary infarct no associated hemorrhage or mass effect. Evolution of left corona radiata basal ganglia infarct since 03/20/2017. CT angiogram head and neck with no dissection or aneurysm.  No large vessel occlusion. Recent echocardiogram with ejection fraction of 65% overall motion abnormality. Patient also with recent left ureteral stone seen by urology Dr. Cleotis Lema underwent a cystoscopy 03/23/2017 showing moderate hydronephrosis no masses or lesions. Plan was for stone extraction in approximately 4 weeks. He had been placed on Flomax. Patient currently remains on aspirin and Plavix as prior to admission. Subcutaneous Lovenox for DVT prophylaxis. Mechanical soft thin liquid diet. Physical therapy evaluation completed 04/12/2017 with recommendations of physical medicine rehabilitation consult. Patient was admitted for comprehensive rehabilitation program 04/13/17.  Patient's medical record from [facility] has been reviewed by the rehabilitation admission coordinator and physician.  NIH Stroke scale: 3  Past Medical History      Past Medical History:  Diagnosis Date  . Anxiety   . Chronic back pain   . CVA (cerebral vascular accident) (HCC) 03/20/2017  . Hepatitis C    HEP C, never treated, 2007    Family  History   family history includes Cancer in his mother.  Prior Rehab/Hospitalizations Has the patient had major surgery during 100 days prior to admission? Yes             Current Medications Refer to [facility]   Patients Current Diet:  Dys.3 textures and thin liquids   Precautions / Restrictions Precautions Precautions: Fall Restrictions Weight Bearing Restrictions: Yes   Has the patient had 2 or more falls or a fall with injury in the past year?Yes  Prior Activity Level Community (5-7x/wk): Prior to admission patient had just recovered from his previous CVA to the point where he was independent at home.  Prior to his initial stroke he was fully independent, driving, and active.  He has supportive sons and a dog.   Prior Functional Level Self Care: Did the patient need help bathing, dressing, using the toilet or eating? Independent  Indoor Mobility: Did the patient need assistance with walking from room to room (with or without device)? Independent  Stairs: Did the patient need assistance with internal or external stairs (with or without device)? Needed some help  Functional Cognition: Did the patient need help planning regular tasks such as shopping or remembering to take medications? Independent  Home Assistive Devices / Equipment Home Assistive Devices/Equipment: Environmental consultant (specify type), Cane (specify quad or straight) Home Equipment: Walker - standard  Prior Device Use: Indicate devices/aids used by the patient prior to current illness, exacerbation or injury? None of the above, per son's report patient had just recently progressed off the walker   Prior Functional Level Current Functional Level  Bed Mobility  Independent  Min assist   Transfers  Independent  Mod assist   Mobility - Walk/Wheelchair  Independent  Max assist   Upper Body Dressing  Independent  Max assist   Lower Body Dressing  Independent  Max assist     Grooming  Independent  Min assist   Eating/Drinking  Independent  Min assist   Toilet Transfer  Independent  Mod assist   Bladder Continence   Continent   Continent    Bowel Management  Continent   Continent    Stair Climbing  Some Assist    (TBD)   Communication  Mod I  Max assist    Memory  Mod I  Min assist    Cooking/Meal Prep  Some Assist from sons       Housework  Some Assist from sons    Money Management  Some assist from sons  Driving  Yes prior to first CVA, but not after sons were providing transportation       Special needs/care consideration BiPAP/CPAP: No CPM: No Continuous Drip IV: No Dialysis: No         Life Vest: No Oxygen: No Special Bed: No Trach Size: No Wound Vac (area): No       Skin: WDL                               Bowel mgmt: Continent, last BM 04/10/17 Bladder mgmt: Continent, but documented red output from assessment  Diabetic mgmt:   Previous Home Environment Living Arrangements: Children  Lives With: Son Available Help at Discharge: Family, Available 24 hours/day Type of Home: House Home Layout: Two level, Able to live on main level with bedroom/bathroom, Bed/bath upstairs Alternate Level Stairs-Rails: Left Alternate Level Stairs-Number of Steps: full flight  Home Access: Stairs to enter Entrance Stairs-Rails: Right, Left, Can reach both Entrance Stairs-Number of Steps: 5 in front rails to wide to use both, 17 step to 2nd floor in house with siderail on the left Bathroom Shower/Tub: Health visitor: Administrator Accessibility: Yes Home Care Services: Other (Comment) (advanced homecare)  Discharge Living Setting Plans for Discharge Living Setting: Patient's home (Son lives with him ) Type of Home at Discharge: House (Duplex) Discharge Home Layout: Multi-level, Able to live on main level with bedroom/bathroom (Plans are to move patient to main level  ) Discharge Home Access: Stairs to enter Entrance Stairs-Rails: Can reach both Entrance Stairs-Number of Steps: 5 Discharge Bathroom Shower/Tub: Tub/shower unit, Curtain Discharge Bathroom Toilet: Standard Discharge Bathroom Accessibility: Yes How Accessible: Accessible via walker Does the patient have any problems obtaining your medications?: No  Social/Family/Support Systems Patient Roles: Parent (3 sons) Contact Information: Son: Tamir Wallman cell:[phone] Anticipated Caregiver: Sons will work together to provide 24/7 upon dischrage  Anticipated Industrial/product designer Information: See above Jomarie Longs to coordinate Ability/Limitations of Caregiver: They all work or go to school  Caregiver Availability: 24/7 Discharge Plan Discussed with Primary Caregiver: Yes Is Caregiver In Agreement with Plan?: Yes Does Caregiver/Family have Issues with Lodging/Transportation while Pt is in Rehab?: No  Goals/Additional Needs Patient/Family Goal for Rehab: PT/OT/SLP Min A-Supervision  Expected length of stay: 18-20 days  Cultural Considerations: [employer]  Dietary Needs: Dys.3 textures and thin liquids  Equipment Needs: TBD Special Service Needs: N/A Additional Information: Patient with recent CVA Pt/Family Agrees to Admission and willing to participate: Yes Program Orientation Provided & Reviewed with Pt/Caregiver Including Roles  & Responsibilities: Yes Additional Information Needs: Patient and family have requested education for diet and lifestyle changes that are needed to further reduce stroke risk Information Needs to be Provided By: Team   Patient Condition: I have reviewed patient's medical record and discussed our program with the patient's son, Jomarie Longs over the phone.  He reports that he and his dad are in favor of our program in order to maximize his functional independence and reduce his risk of stroke moving forward.  Prior to patient's initial stroke he was fully independent  and active.  Following, his first stroke he had progressed to being independent at home without the need of an assistive device.  Patient makes a great candidate for our IP Rehab program.  He will receive 24/7 rehab nursing care, daily doctor visits, and skilled therapy for 3 hours a day/5 out of 7 days of the week.  Preadmission Screen Completed By:  Fae Pippin, 04/13/2017 1:04 PM ______________________________________________________________________   Discussed status with Dr. Wynn Banker on 04/13/17 at 1320 and received telephone approval for admission today.  Admission Coordinator:  Fae Pippin, time 1320/Date 04/13/17   Assessment/Plan: Diagnosis:Left MCA infarct with Right hemiparesis 03/20/17, Right medullary infarct with Left hemiparesis 04/12/17 1. Does the need for close, 24 hr/day  Medical supervision in concert with the patient's rehab needs make it unreasonable for this patient to be served in a less intensive setting? Yes 2. Co-Morbidities requiring supervision/potential complications: HTN, ureteral stone 3. Due to bladder management, bowel management, safety, skin/wound care, disease management, medication administration, pain management and patient education, does the patient require 24 hr/day rehab nursing? Yes 4. Does the patient require coordinated care of a physician, rehab nurse, PT (1-2 hrs/day, 5 days/week), OT (1-2 hrs/day, 5 days/week) and SLP (.5-1 hrs/day, 5 days/week) to address physical and functional deficits in the context of the above medical diagnosis(es)? Yes Addressing deficits in the following areas: balance, endurance, locomotion, strength, transferring, bowel/bladder control, bathing, dressing, feeding, grooming, toileting, swallowing and psychosocial support 5. Can the patient actively participate in an intensive therapy program of at least 3 hrs of therapy 5 days a week? Yes 6. The potential for patient to make measurable gains while on inpatient rehab is  good 7. Anticipated functional outcomes upon discharge from inpatients are: supervision and min assist PT, supervision and min assist OT, supervision SLP 8. Estimated rehab length of stay to reach the above functional goals is: 18-21d 9. Does the patient have adequate social supports to accommodate these discharge functional goals? Yes 10. Anticipated D/C setting: Home 11. Anticipated post D/C treatments: HH therapy 12. Overall Rehab/Functional Prognosis: good    RECOMMENDATIONS: This patient's condition is appropriate for continued rehabilitative care in the following setting: CIR Patient has agreed to participate in recommended program. Yes Note that insurance prior authorization may be required for reimbursement for recommended care.  Comment:  Fae Pippin 04/13/2017    Revision History

## 2017-04-13 NOTE — Clinical Social Work Note (Signed)
Patient accepted at CIR.  LCSW signing off.     Illya Gienger D, LCSW  

## 2017-04-13 NOTE — PMR Pre-admission (Signed)
Secondary [facility] PMR Admission Coordinator Pre-Admission Assessment  Patient: Brendan Charles is an 61 y.o., male MRN: [id number] DOB: December 05, 1955 Height:  (180.3 cm) Weight: 72.5 kg (159 lb 13.7 oz)  Insurance Information HMO: X    PPO:      PCP:      IPA:      80/20:      OTHER:  PRIMARY: UHC Medicare       Policy#: [id number]      Subscriber: Self CM Name: Rebeca Alert      Phone#: [phone]     Fax#: [phone] Pre-Cert#: [id number] 04/14/07-04/19/17     Employer: Retired  Benefits:  Phone #: Verified online     Name: UHC Online Eff. Date: 08/08/16     Deduct: $0      Out of Pocket Max: [phone]      Life Max: N/A CIR: $430 a day, days 1-4; $0 days 5+      SNF: $0 a day, days 1-20; $160 a day, days 21-62; $0 a day, days 63-100 Outpatient: PT/OT/SLP     Co-Pay: $40 per visit  Home Health: 100%      Co-Pay: None DME: 80%     Co-Pay: 20%  Providers: In-network   SECONDARY: None      Policy#:       Subscriber:  CM Name:       Phone#:      Fax#:  Pre-Cert#:       Employer:  Benefits:  Phone #:      Name:  Eff. Date:      Deduct:       Out of Pocket Max:       Life Max:  CIR:       SNF:  Outpatient:      Co-Pay:  Home Health:       Co-Pay:  DME:      Co-Pay:   Medicaid Application Date:       Case Manager:  Disability Application Date:       Case Worker:   Emergency Contact Information Contact Information    Name Relation Home Work Mobile   [place] Son [phone]  [phone]      Current Medical History  Patient Admitting Diagnosis: Acute lacunar infarct involving the right medulla oblongata medial aspect  History of Present Illness: 61 year old right-handed male with history of tobacco abuse, hepatitis C and recent CVA with right hemiparesis due to left MCA infarct admitted 03/20/2017 discharge 03/21/2017 ambulating minimal assist to 100 feet using a rolling walker. Discharged on aspirin and Plavix. Per chart review patient lives with family. Was using a  walker prior to admission. 2 level home with bath and bedroom on main level and 5 steps to entry of home. Family can assist as needed. He was able to complete ADL tasks with supervision for safety. He was receiving home therapies with advanced home care. Patient was doing well until 04/12/2017 with left-sided weakness and numbness as well as severe dysarthria. CT/MRI showed small acute right medullary infarct no associated hemorrhage or mass effect. Evolution of left corona radiata basal ganglia infarct since 03/20/2017. CT angiogram head and neck with no dissection or aneurysm. No large vessel occlusion. Recent echocardiogram with ejection fraction of 65% overall motion abnormality. Patient also with recent left ureteral stone seen by urology Dr. Cleotis Lema underwent a cystoscopy 03/23/2017 showing moderate hydronephrosis no masses or lesions. Plan was for stone extraction in approximately 4 weeks. He had been  placed on Flomax. Patient currently remains on aspirin and Plavix as prior to admission. Subcutaneous Lovenox for DVT prophylaxis. Mechanical soft thin liquid diet. Physical therapy evaluation completed 04/12/2017 with recommendations of physical medicine rehabilitation consult. Patient was admitted for comprehensive rehabilitation program 04/13/17.  Patient's medical [facility] from [facility] has been reviewed by the rehabilitation admission coordinator and physician.  NIH Stroke scale: 3  Past Medical History  Past Medical History:  Diagnosis Date  . Anxiety   . Chronic back pain   . CVA (cerebral vascular accident) (HCC) 03/20/2017  . Hepatitis C    HEP C, never treated, 2007    Family History   family history includes Cancer in his mother.  Prior Rehab/Hospitalizations Has the patient had major surgery during 100 days prior to admission? Yes   Current Medications Refer to [facility]   Patients Current Diet:  Dys.3 textures and thin liquids   Precautions /  Restrictions Precautions Precautions: Fall Restrictions Weight Bearing Restrictions: Yes   Has the patient had 2 or more falls or a fall with injury in the past year?Yes  Prior Activity Level Community (5-7x/wk): Prior to admission patient had just recovered from his previous CVA to the point where he was independent at home.  Prior to his initial stroke he was fully independent, driving, and active.  He has supportive sons and a dog.   Prior Functional Level Self Care: Did the patient need help bathing, dressing, using the toilet or eating? Independent  Indoor Mobility: Did the patient need assistance with walking from room to room (with or without device)? Independent  Stairs: Did the patient need assistance with internal or external stairs (with or without device)? Needed some help  Functional Cognition: Did the patient need help planning regular tasks such as shopping or remembering to take medications? Independent  Home Assistive Devices / Equipment Home Assistive Devices/Equipment: Environmental consultant (specify type), Cane (specify quad or straight) Home Equipment: Walker - standard  Prior Device Use: Indicate devices/aids used by the patient prior to current illness, exacerbation or injury? None of the above, per son's report patient had just recently progressed off the walker   Prior Functional Level Current Functional Level  Bed Mobility  Independent  Min assist   Transfers  Independent  Mod assist   Mobility - Walk/Wheelchair  Independent  Max assist   Upper Body Dressing  Independent  Max assist   Lower Body Dressing  Independent  Max assist   Grooming  Independent  Min assist   Eating/Drinking  Independent  Min assist   Toilet Transfer  Independent  Mod assist   Bladder Continence   Continent   Continent    Bowel Management  Continent   Continent    Stair Climbing  Some Assist    (TBD)   Communication  Mod I  Max assist    Memory  Mod I  Min  assist    Cooking/Meal Prep  Some Assist from sons       Housework  Some Assist from sons    Money Management  Some assist from sons    Driving  Yes prior to first CVA, but not after sons were providing transportation       Special needs/care consideration BiPAP/CPAP: No CPM: No Continuous Drip IV: No Dialysis: No         Life Vest: No Oxygen: No Special Bed: No Trach Size: No Wound Vac (area): No       Skin: WDL  Bowel mgmt: Continent, last BM 04/10/17 Bladder mgmt: Continent, but documented red output from assessment  Diabetic mgmt:   Previous Home Environment Living Arrangements: Children  Lives With: Son Available Help at Discharge: Family, Available 24 hours/day Type of Home: House Home Layout: Two level, Able to live on main level with bedroom/bathroom, Bed/bath upstairs Alternate Level Stairs-Rails: Left Alternate Level Stairs-Number of Steps: full flight  Home Access: Stairs to enter Entrance Stairs-Rails: Right, Left, Can reach both Entrance Stairs-Number of Steps: 5 in front rails to wide to use both, 17 step to 2nd floor in house with siderail on the left Bathroom Shower/Tub: Health visitor: Administrator Accessibility: Yes Home Care Services: Other (Comment) (advanced homecare)  Discharge Living Setting Plans for Discharge Living Setting: Patient's home (Son lives with him ) Type of Home at Discharge: House (Duplex) Discharge Home Layout: Multi-level, Able to live on main level with bedroom/bathroom (Plans are to move patient to main level ) Discharge Home Access: Stairs to enter Entrance Stairs-Rails: Can reach both Entrance Stairs-Number of Steps: 5 Discharge Bathroom Shower/Tub: Tub/shower unit, Curtain Discharge Bathroom Toilet: Standard Discharge Bathroom Accessibility: Yes How Accessible: Accessible via walker Does the patient have any problems obtaining your medications?:  No  Social/Family/Support Systems Patient Roles: Parent (3 sons) Contact Information: Son: Erica Osuna [phone] Anticipated Caregiver: Sons will work together to provide 24/7 upon dischrage  Anticipated Industrial/product designer Information: See above Jomarie Longs to coordinate Ability/Limitations of Caregiver: They all work or go to school  Caregiver Availability: 24/7 Discharge Plan Discussed with Primary Caregiver: Yes Is Caregiver In Agreement with Plan?: Yes Does Caregiver/Family have Issues with Lodging/Transportation while Pt is in Rehab?: No  Goals/Additional Needs Patient/Family Goal for Rehab: PT/OT/SLP Min A-Supervision  Expected length of stay: 18-20 days  Cultural Considerations: [employer]  Dietary Needs: Dys.3 textures and thin liquids  Equipment Needs: TBD Special Service Needs: N/A Additional Information: Patient with recent CVA Pt/Family Agrees to Admission and willing to participate: Yes Program Orientation Provided & Reviewed with Pt/Caregiver Including Roles  & Responsibilities: Yes Additional Information Needs: Patient and family have requested education for diet and lifestyle changes that are needed to further reduce stroke risk Information Needs to be Provided By: Team   Patient Condition: I have reviewed patient's medical record and discussed our program with the patient's son, Jomarie Longs over the phone.  He reports that he and his dad are in favor of our program in order to maximize his functional independence and reduce his risk of stroke moving forward.  Prior to patient's initial stroke he was fully independent and active.  Following, his first stroke he had progressed to being independent at home without the need of an assistive device.  Patient makes a great candidate for our IP Rehab program.  He will receive 24/7 rehab nursing care, daily doctor visits, and skilled therapy for 3 hours a day/5 out of 7 days of the week.    Preadmission Screen Completed By:   Fae Pippin, 04/13/2017 1:04 PM ______________________________________________________________________   Discussed status with Dr. Wynn Banker on 04/13/17 at 1320 and received telephone approval for admission today.  Admission Coordinator:  Fae Pippin, time 1320/Date 04/13/17   Assessment/Plan: Diagnosis:Left MCA infarct with Right hemiparesis 03/20/17, Right medullary infarct with Left hemiparesis 04/12/17 1. Does the need for close, 24 hr/day  Medical supervision in concert with the patient's rehab needs make it unreasonable for this patient to be served in a less intensive setting? Yes 2. Co-Morbidities requiring supervision/potential complications: HTN,  ureteral stone 3. Due to bladder management, bowel management, safety, skin/wound care, disease management, medication administration, pain management and patient education, does the patient require 24 hr/day rehab nursing? Yes 4. Does the patient require coordinated care of a physician, rehab nurse, PT (1-2 hrs/day, 5 days/week), OT (1-2 hrs/day, 5 days/week) and SLP (.5-1 hrs/day, 5 days/week) to address physical and functional deficits in the context of the above medical diagnosis(es)? Yes Addressing deficits in the following areas: balance, endurance, locomotion, strength, transferring, bowel/bladder control, bathing, dressing, feeding, grooming, toileting, swallowing and psychosocial support 5. Can the patient actively participate in an intensive therapy program of at least 3 hrs of therapy 5 days a week? Yes 6. The potential for patient to make measurable gains while on inpatient rehab is good 7. Anticipated functional outcomes upon discharge from inpatients are: supervision and min assist PT, supervision and min assist OT, supervision SLP 8. Estimated rehab length of stay to reach the above functional goals is: 18-21d 9. Does the patient have adequate social supports to accommodate these discharge functional goals? Yes 10. Anticipated D/C  setting: Home 11. Anticipated post D/C treatments: HH therapy 12. Overall Rehab/Functional Prognosis: good    RECOMMENDATIONS: This patient's condition is appropriate for continued rehabilitative care in the following setting: CIR Patient has agreed to participate in recommended program. Yes Note that insurance prior authorization may be required for reimbursement for recommended care.  Comment:  Fae PippinMelissa Bowie 04/13/2017

## 2017-04-13 NOTE — Care Management Important Message (Signed)
Important Message  Patient Details  Name: Brendan Charles MRN: [id number] Date of Birth: [birth year]   Medicare Important Message Given:  Yes    Malcolm MetroChildress, Jakeya Gherardi Demske, RN 04/13/2017, 1:15 PM

## 2017-04-13 NOTE — Progress Notes (Addendum)
Inpatient Rehabilitation  I have left a second voice mail for patient's son [name].  I have also initiated insurance authorization.  Plan to follow up with team when I know.  Please call with questions.   Update: I have discuss our rehab program with [name] via phone and shared our web site with him.  He will share this information with his dad today, but stated that they are in agreement with plans for CIR.  I continue to wait on insurance authorization and will update team when I know.       Charlane FerrettiMelissa Demyah Smyre, M.A., CCC/SLP Admission Coordinator  [facility]  Cell [phone]

## 2017-04-13 NOTE — Progress Notes (Signed)
Pt arrived to unit via EMS from [facility]. Pt alert and oriented with slurred speech, left side weaker than right, lungs diminished in all fields, encouraged deep breathing, no skin issues, belongs consist of cell phone, uses urinal and voids bright red fluid which reported to MD, pt reports last BM was Sunday, sorbitol administered, no skin issues noted at this time.  During meal time med pass pt noted with coughing and difficultly swallowing. Pt was able to swallow better with Nectar thick liquids, removed thin liquids from room. Family at bedside and unsure if was to have straw or not.

## 2017-04-13 NOTE — Progress Notes (Signed)
Physical Therapy Treatment Patient Details Name: Brendan Charles MRN: [id number] DOB: [birth year] Today's Date: 04/13/2017    History of Present Illness Brendan Charles is a 61 y.o. male with history of stroke recently discharged 2 weeks ago presents to the ER with complaints of sudden onset of numbness and weakness of the left upper extremity around 9:30 PM last night (04/11/2017). Patient denies any difficulty speaking and swallowing or any visual symptoms. Patient was admitted 2 weeks ago for stroke with right-sided weakness and at that time patient had stroke workup and was placed on aspirin and Plavix and statins. Patient states he has been taking his medications. MRI positive for acute infarct.      PT Comments    Patient demonstrates poor trunk control when sitting unsupported, can keep trunk in midline for up to 1-2 minutes while performing BLE ROM exercises, Mod/max assist with RW 10 feet with frequent scissoring of legs, limited secondary to poor balance and fatigue, required Mod assist to reposition when put back to bed.  Patient will benefit from continued physical therapy in hospital and recommended venue below to increase strength, balance, endurance for safe ADLs and gait.   Follow Up Recommendations  CIR     Equipment Recommendations  Rolling walker with 5" wheels;Wheelchair (measurements PT);Wheelchair cushion (measurements PT)    Recommendations for Other Services       Precautions / Restrictions Precautions Precautions: Fall Restrictions Weight Bearing Restrictions: Yes    Mobility  Bed Mobility Overal bed mobility: Needs Assistance Bed Mobility: Supine to Sit;Sit to Supine     Supine to sit: Min assist Sit to supine: Min assist   General bed mobility comments: frequent falling backwards due to trunk weakness  Transfers Overall transfer level: Needs assistance Equipment used: Rolling walker (2 wheeled) Transfers: Sit to/from [employer] CorporationStand;Stand Pivot  Transfers Sit to Stand: Mod assist Stand pivot transfers: Mod assist       General transfer comment: Pt able to maintain standing for short periods with mod assist, max assist for stand-pivot transfer. Pt has significant difficulty with side-stepping, able to bear weight on LLE when stepping back with RLE. Verbal cuing for sequencing.   Ambulation/Gait Ambulation/Gait assistance: Max assist;Mod assist Ambulation Distance (Feet): 10 Feet Assistive device: Rolling walker (2 wheeled) Gait Pattern/deviations: Decreased stance time - right;Decreased stance time - left;Decreased step length - left;Decreased step length - right;Decreased stride length;Leaning posteriorly;Staggering left;Staggering right   Gait velocity interpretation: Below normal speed for age/gender General Gait Details: frequent scissoring of legs, especially when stepping backwards   Stairs            Wheelchair Mobility    Modified Rankin (Stroke Patients Only)       Balance Overall balance assessment: Needs assistance Sitting-balance support: Bilateral upper extremity supported;Feet supported Sitting balance-Leahy Scale: Poor   Postural control: Posterior lean Standing balance support: Bilateral upper extremity supported;During functional activity Standing balance-Leahy Scale: Poor                              Cognition Arousal/Alertness: Awake/alert Behavior During Therapy: WFL for tasks assessed/performed Overall Cognitive Status: Within Functional Limits for tasks assessed                                        Exercises General Exercises - Upper Extremity Shoulder Flexion: AAROM;10 reps Shoulder Extension:  AAROM;10 reps Elbow Flexion: AAROM;10 reps Elbow Extension: AAROM;10 reps Wrist Flexion: AROM;10 reps Wrist Extension: AROM;10 reps Digit Composite Flexion: AROM;10 reps Composite Extension: AROM;10 reps General Exercises - Lower Extremity Ankle  Circles/Pumps: AROM;Seated;Both Long Arc Quad: AROM;Seated;Both Hip Flexion/Marching: Both;AROM;Seated Toe Raises: Both;AROM;Seated Heel Raises: Both;AROM;Seated Hand Exercises Opposition: AROM;10 reps Other Exercises Other Exercises: towel crumple working on coordination, 10X Other Exercises: weight-shifting/weight-bearing on forearms, 1 minute each direction, 2 trials.     General Comments        Pertinent Vitals/Pain Pain Assessment: No/denies pain    Home Living                      Prior Function            PT Goals (current goals can now be found in the care plan section) Acute Rehab PT Goals Patient Stated Goal: Return to prior level of function PT Goal Formulation: With patient Time For Goal Achievement: 04/26/17 Potential to Achieve Goals: Good Progress towards PT goals: Progressing toward goals    Frequency    Min 3X/week      PT Plan Current plan remains appropriate    Co-evaluation              AM-PAC PT "6 Clicks" Daily Activity  Outcome Measure  Difficulty turning over in bed (including adjusting bedclothes, sheets and blankets)?: Unable Difficulty moving from lying on back to sitting on the side of the bed? : Unable Difficulty sitting down on and standing up from a chair with arms (e.g., wheelchair, bedside commode, etc,.)?: Unable Help needed moving to and from a bed to chair (including a wheelchair)?: A Lot Help needed walking in hospital room?: A Lot Help needed climbing 3-5 steps with a railing? : Total 6 Click Score: 8    End of Session Equipment Utilized During Treatment: Gait belt Activity Tolerance: Patient tolerated treatment well;Patient limited by fatigue Patient left: in bed;with call bell/phone within reach Nurse Communication: Mobility status PT Visit Diagnosis: Unsteadiness on feet (R26.81);Other abnormalities of gait and mobility (R26.89);Muscle weakness (generalized) (M62.81);Repeated falls (R29.6)      Time: 1610-9604 PT Time Calculation (min) (ACUTE ONLY): 32 min  Charges:  $Therapeutic Activity: 23-37 mins                    G Codes:       12:10 PM, May 10, 2017 Ocie Bob, MPT Physical Therapist with [facility] [phone] office [phone] mobile phone

## 2017-04-13 NOTE — Discharge Summary (Signed)
Physician Discharge Summary  WOODS GANGEMI MRN: [id number] DOB/AGE: 63-Jun-1957 63 y.o.  PCP: Elfredia Nevins, MD   Admit date: 04/12/2017 Discharge date: 04/13/2017  Discharge Diagnoses:    Principal Problem:   Acute ischemic stroke [facility]) Active Problems:   Stroke (cerebrum) (HCC)   Acute CVA (cerebrovascular accident) (HCC)   Weakness    Follow-up recommendations Follow-up with PCP in 3-5 days , including all  additional recommended appointments as below Follow-up CBC, CMP in 3-5 days Patient will follow up with [facility] in [place] in 4-6 weeks      Current Discharge Medication List    START taking these medications   Details  amLODipine (NORVASC) 5 MG tablet Take 1 tablet (5 mg total) by mouth daily. Qty: 30 tablet, Refills: 2      CONTINUE these medications which have CHANGED   Details  ALPRAZolam (XANAX) 0.5 MG tablet Take 1 tablet (0.5 mg total) by mouth 3 (three) times daily as needed for anxiety. Qty: 4 tablet, Refills: 0    HYDROcodone-acetaminophen (NORCO) 10-325 MG tablet Take 1 tablet by mouth every 6 (six) hours as needed. Qty: 5 tablet, Refills: 0      CONTINUE these medications which have NOT CHANGED   Details  aspirin 325 MG tablet Take 1 tablet (325 mg total) by mouth daily. Qty: 30 tablet, Refills: 12    atorvastatin (LIPITOR) 80 MG tablet Take 1 tablet (80 mg total) by mouth daily at 6 PM. Qty: 30 tablet, Refills: 3    clopidogrel (PLAVIX) 75 MG tablet Take 1 tablet (75 mg total) by mouth daily with breakfast. Qty: 30 tablet, Refills: 3    ondansetron (ZOFRAN ODT) 8 MG disintegrating tablet Take 1 tablet (8 mg total) by mouth every 8 (eight) hours as needed for nausea or vomiting. Qty: 20 tablet, Refills: 0    tamsulosin (FLOMAX) 0.4 MG CAPS capsule Take 1 capsule (0.4 mg total) by mouth daily. Qty: 30 capsule, Refills: 0      STOP taking these medications     oxyCODONE-acetaminophen (PERCOCET) 7.5-325 MG  tablet          Discharge Condition: *Stable   Discharge Instructions Get Medicines reviewed and adjusted: Please take all your medications with you for your next visit with your Primary MD  Please request your Primary MD to go over all hospital tests and procedure/radiological results at the follow up, please ask your Primary MD to get all Hospital records sent to his/her office.  If you experience worsening of your admission symptoms, develop shortness of breath, life threatening emergency, suicidal or homicidal thoughts you must seek medical attention immediately by calling 911 or calling your MD immediately if symptoms less severe.  You must read complete instructions/literature along with all the possible adverse reactions/side effects for all the Medicines you take and that have been prescribed to you. Take any new Medicines after you have completely understood and accpet all the possible adverse reactions/side effects.   Do not drive when taking Pain medications.   Do not take more than prescribed Pain, Sleep and Anxiety Medications  Special Instructions: If you have smoked or chewed Tobacco in the last 2 yrs please stop smoking, stop any regular Alcohol and or any Recreational drug use.  Wear Seat belts while driving.  Please note  You were cared for by a hospitalist during your hospital stay. Once you are discharged, your primary care physician will handle any further medical issues. Please note that NO REFILLS for  any discharge medications will be authorized once you are discharged, as it is imperative that you return to your primary care physician (or establish a relationship with a primary care physician if you do not have one) for your aftercare needs so that they can reassess your need for medications and monitor your lab values.  Discharge Instructions    Ambulatory referral to Neurology    Complete by:  As directed    An appointment is requested in 4- 6 weeks with  [facility]   Diet - low sodium heart healthy    Complete by:  As directed    Diet - low sodium heart healthy    Complete by:  As directed    Increase activity slowly    Complete by:  As directed    Increase activity slowly    Complete by:  As directed        Allergies  Allergen Reactions  . Codeine Itching and Nausea Only      Disposition: 01-Home or Self Care   Consults: * Neurology    Significant Diagnostic Studies:  Ct Angio Head W Or Wo Contrast  Result Date: 04/12/2017 CLINICAL DATA:  61 y/o  M; 61 y/o  M; right-sided numbness. EXAM: CT ANGIOGRAPHY HEAD AND NECK TECHNIQUE: Multidetector CT imaging of the head and neck was performed using the standard protocol during bolus administration of intravenous contrast. Multiplanar CT image reconstructions and MIPs were obtained to evaluate the vascular anatomy. Carotid stenosis measurements (when applicable) are obtained utilizing NASCET criteria, using the distal internal carotid diameter as the denominator. CONTRAST:  100 cc Isovue 370 COMPARISON:  04/12/2017 CT head.  03/20/2017 MRI head. FINDINGS: CTA NECK FINDINGS Aortic arch: Standard branching. Imaged portion shows no evidence of aneurysm or dissection. No significant stenosis of the major arch vessel origins. Right carotid system: No evidence of dissection, stenosis (50% or greater) or occlusion. Left carotid system: No evidence of dissection, stenosis (50% or greater) or occlusion. Vertebral arteries: Codominant. No evidence of dissection, stenosis (50% or greater) or occlusion. Skeleton: C5-C7 anterior cervical discectomy and fusion. Advanced cervical degenerative changes at the C3-4 level with severe disc space narrowing, a disc protrusion, and anterior marginal osteophytes. Other neck: 13 mm nodule within right lobe of thyroid. Upper chest: Negative. Review of the MIP images confirms the above findings CTA HEAD FINDINGS Anterior circulation: No significant  stenosis, proximal occlusion, aneurysm, or vascular malformation. Posterior circulation: Tandem segments of severe stenosis of right vertebral artery. Patent left vertebral artery, basilar artery, and bilateral posterior cerebral arteries. No large vessel occlusion, aneurysm, or significant stenosis is identified. Venous sinuses: As permitted by contrast timing, patent. Anatomic variants: Anterior communicating artery and probable diminutive bilateral posterior communicating arteries. Delayed phase: No abnormal intracranial enhancement. Review of the MIP images confirms the above findings IMPRESSION: 1. Patent carotid and vertebral arteries in the neck. No dissection, aneurysm, or hemodynamically significant stenosis utilizing NASCET criteria. 2. Patent circle of Willis.  No large vessel occlusion or aneurysm. 3. Tandem segments of severe stenosis of right vertebral artery proximal to PICA origin. Otherwise no significant intracranial arterial stenosis. These results were called by telephone at the time of interpretation on 04/12/2017 at 2:28 am to Dr. Joseph Berkshire , who verbally acknowledged these results. Electronically Signed   By: Kristine Garbe M.D.   On: 04/12/2017 02:28   Ct Head Wo Contrast  Result Date: 03/20/2017 CLINICAL DATA:  Right side weakness EXAM: CT HEAD WITHOUT CONTRAST TECHNIQUE: Contiguous axial  images were obtained from the base of the skull through the vertex without intravenous contrast. COMPARISON:  None FINDINGS: Brain: Chronic microvascular disease throughout the deep white matter. Old left periventricular lacunar infarcts. Old right internal capsule lacunar infarct. No acute intracranial abnormality. Specifically, no hemorrhage, hydrocephalus, mass lesion, acute infarction, or significant intracranial injury. Vascular: No hyperdense vessel or unexpected calcification. Skull: No acute calvarial abnormality. Sinuses/Orbits: Visualized paranasal sinuses and mastoids clear.  Orbital soft tissues unremarkable. Other: None IMPRESSION: Chronic microvascular disease and old bilateral lacunar infarcts. No acute intracranial abnormality. Electronically Signed   By: Rolm Baptise M.D.   On: 03/20/2017 10:03   Ct Angio Neck W And/or Wo Contrast  Result Date: 04/12/2017 CLINICAL DATA:  61 y/o  M; 61 y/o  M; right-sided numbness. EXAM: CT ANGIOGRAPHY HEAD AND NECK TECHNIQUE: Multidetector CT imaging of the head and neck was performed using the standard protocol during bolus administration of intravenous contrast. Multiplanar CT image reconstructions and MIPs were obtained to evaluate the vascular anatomy. Carotid stenosis measurements (when applicable) are obtained utilizing NASCET criteria, using the distal internal carotid diameter as the denominator. CONTRAST:  100 cc Isovue 370 COMPARISON:  04/12/2017 CT head.  03/20/2017 MRI head. FINDINGS: CTA NECK FINDINGS Aortic arch: Standard branching. Imaged portion shows no evidence of aneurysm or dissection. No significant stenosis of the major arch vessel origins. Right carotid system: No evidence of dissection, stenosis (50% or greater) or occlusion. Left carotid system: No evidence of dissection, stenosis (50% or greater) or occlusion. Vertebral arteries: Codominant. No evidence of dissection, stenosis (50% or greater) or occlusion. Skeleton: C5-C7 anterior cervical discectomy and fusion. Advanced cervical degenerative changes at the C3-4 level with severe disc space narrowing, a disc protrusion, and anterior marginal osteophytes. Other neck: 13 mm nodule within right lobe of thyroid. Upper chest: Negative. Review of the MIP images confirms the above findings CTA HEAD FINDINGS Anterior circulation: No significant stenosis, proximal occlusion, aneurysm, or vascular malformation. Posterior circulation: Tandem segments of severe stenosis of right vertebral artery. Patent left vertebral artery, basilar artery, and bilateral posterior cerebral  arteries. No large vessel occlusion, aneurysm, or significant stenosis is identified. Venous sinuses: As permitted by contrast timing, patent. Anatomic variants: Anterior communicating artery and probable diminutive bilateral posterior communicating arteries. Delayed phase: No abnormal intracranial enhancement. Review of the MIP images confirms the above findings IMPRESSION: 1. Patent carotid and vertebral arteries in the neck. No dissection, aneurysm, or hemodynamically significant stenosis utilizing NASCET criteria. 2. Patent circle of Willis.  No large vessel occlusion or aneurysm. 3. Tandem segments of severe stenosis of right vertebral artery proximal to PICA origin. Otherwise no significant intracranial arterial stenosis. These results were called by telephone at the time of interpretation on 04/12/2017 at 2:28 am to Dr. Joseph Berkshire , who verbally acknowledged these results. Electronically Signed   By: Kristine Garbe M.D.   On: 04/12/2017 02:28   Mr Jodene Nam Head Wo Contrast  Addendum Date: 03/20/2017   ADDENDUM REPORT: 03/20/2017 12:01 ADDENDUM: 1.5 mm aneurysm or more likely infundibulum of the left posterior communicating artery. This could be followed with a MRA or CTA at 6-12 months. Electronically Signed   By: San Morelle M.D.   On: 03/20/2017 12:01   Result Date: 03/20/2017 CLINICAL DATA:  Focal neuro deficit for greater than 6 hours. Stroke suspected. Abnormal speech and right-sided weakness on Wednesday last week after colonoscopy onto state. Right-sided facial droop. EXAM: MRI HEAD WITHOUT CONTRAST MRA HEAD WITHOUT CONTRAST TECHNIQUE: Multiplanar, multiecho pulse  sequences of the brain and surrounding structures were obtained without intravenous contrast. Angiographic images of the head were obtained using MRA technique without contrast. COMPARISON:  None. CT head without contrast from the same day. FINDINGS: MRI HEAD FINDINGS Brain: Acute nonhemorrhagic infarct involves the  left basal ganglia extending towards the corona radiata. The area of infarction extends 3.5 cm cephalo caudad. T2 hyperintensities are associated. Lacunar infarcts of the basal ganglia bilaterally are remote. Mild white matter changes are noted otherwise. The brainstem and cerebellum are normal. The internal auditory canal is within normal limits. Vascular: Flow is present in the major intracranial arteries. Skull and upper cervical spine: The skullbase is within normal limits. The craniocervical junction is normal. Midline sagittal structures are unremarkable. Sinuses/Orbits: Mucosal thickening and fluid is present in the left maxillary sinus. Bilateral mastoid effusions are present. The globes and orbits are within normal limits. Other: None. MRA HEAD FINDINGS Atherosclerotic irregularity is present within the cavernous internal carotid arteries bilaterally without a significant stenosis. A 1.5 mm aneurysm or more likely infundibulum is present at the left posterior communicating artery. The left A1 segment is dominant to the right. There is no focal stenosis. Mild irregularity is present in the M1 segments bilaterally without significant stenoses. The MCA bifurcations are intact. There is high-grade stenosis or occlusion of the anterior inferior left M2 segment which may associated with the infarct. A moderate to severe stenosis is present at the right vertebral artery a proximal to the PICA origin. The left vertebral artery is the dominant vessel. PICA origins are intact bilaterally. The basilar artery is normal. Both posterior cerebral arteries originate from the basilar tip. There is tapering of distal PCA branch vessels bilaterally. IMPRESSION: 1. Acute nonhemorrhagic infarct of the left basal ganglia extending to the corona radiata measures 3.5 cm in cephalo caudad dimension. 2. Lacunar infarcts of the bilateral basal ganglia are remote. 3. High-grade stenosis or occlusion of the anterior inferior left M2  segment, likely related to the infarct territory. 4. Moderate to high-grade stenosis of the proximal right vertebral artery with distal flow beyond the PICA. Electronically Signed: By: San Morelle M.D. On: 03/20/2017 11:42   Mr Brain Wo Contrast  Result Date: 04/12/2017 CLINICAL DATA:  61 year old male with weakness and confusion. Left corona radiata/basal ganglia infarct earlier this month. Severe distal right vertebral artery stenosis on CTA head and neck earlier today. EXAM: MRI HEAD WITHOUT CONTRAST TECHNIQUE: Multiplanar, multiecho pulse sequences of the brain and surrounding structures were obtained without intravenous contrast. COMPARISON:  CTA head and neck 0156 hours today. Brain MRI 03/20/2017 FINDINGS: The examination had to be discontinued prior to completion due to inability of the patient to cooperate. Axial and coronal diffusion weighted imaging, sagittal T1, and axial FLAIR in T2 weighted imaging was obtained. Brain: Continued trace diffusion signal abnormality in the left corona radiata tracking toward the left lentiform nuclei, but signal on the ADC map has normalized since August. Associated T2 and FLAIR hyperintensity with no mass effect or evidence of interval hemorrhage. New linear and patchy area of restricted diffusion in the right medulla (series 3, image 67 and series 6 image 14). Mild if any associated T2 and FLAIR hyperintensity. No associated hemorrhage or mass effect. No other posterior fossa restricted diffusion. No other restricted diffusion. No midline shift, mass effect, evidence of mass lesion, ventriculomegaly, extra-axial collection or acute intracranial hemorrhage. Cervicomedullary junction and pituitary are within normal limits. Periventricular and other scattered cerebral white matter signal abnormality is stable and  in areas resembles chronic lacunar infarcts. Stable chronic lacunar infarcts of both caudate nuclei. Vascular: Major intracranial vascular flow voids  are stable. Skull and upper cervical spine: Stable. Sinuses/Orbits: Stable and negative orbits soft tissues. Stable left paranasal sinus mucosal thickening. Other: Stable left greater than right mastoid effusions. Negative scalp soft tissues. IMPRESSION: 1. Small acute right medullary infarct with no associated hemorrhage or mass effect. This is likely associated with the severe distal right vertebral artery stenosis demonstrated today by CTA. 2. Evolution of the left corona radiata/basal ganglia infarct since 03/20/2017 with no associated hemorrhage or mass effect. Underlying chronic cerebral white matter and basal ganglia small vessel ischemia. 3.  The examination had to be discontinued prior to completion. Electronically Signed   By: Odessa Fleming M.D.   On: 04/12/2017 08:34   Mr Brain W And Wo Contrast  Addendum Date: 03/20/2017   ADDENDUM REPORT: 03/20/2017 12:01 ADDENDUM: 1.5 mm aneurysm or more likely infundibulum of the left posterior communicating artery. This could be followed with a MRA or CTA at 6-12 months. Electronically Signed   By: Marin Roberts M.D.   On: 03/20/2017 12:01   Result Date: 03/20/2017 CLINICAL DATA:  Focal neuro deficit for greater than 6 hours. Stroke suspected. Abnormal speech and right-sided weakness on Wednesday last week after colonoscopy onto state. Right-sided facial droop. EXAM: MRI HEAD WITHOUT CONTRAST MRA HEAD WITHOUT CONTRAST TECHNIQUE: Multiplanar, multiecho pulse sequences of the brain and surrounding structures were obtained without intravenous contrast. Angiographic images of the head were obtained using MRA technique without contrast. COMPARISON:  None. CT head without contrast from the same day. FINDINGS: MRI HEAD FINDINGS Brain: Acute nonhemorrhagic infarct involves the left basal ganglia extending towards the corona radiata. The area of infarction extends 3.5 cm cephalo caudad. T2 hyperintensities are associated. Lacunar infarcts of the basal ganglia  bilaterally are remote. Mild white matter changes are noted otherwise. The brainstem and cerebellum are normal. The internal auditory canal is within normal limits. Vascular: Flow is present in the major intracranial arteries. Skull and upper cervical spine: The skullbase is within normal limits. The craniocervical junction is normal. Midline sagittal structures are unremarkable. Sinuses/Orbits: Mucosal thickening and fluid is present in the left maxillary sinus. Bilateral mastoid effusions are present. The globes and orbits are within normal limits. Other: None. MRA HEAD FINDINGS Atherosclerotic irregularity is present within the cavernous internal carotid arteries bilaterally without a significant stenosis. A 1.5 mm aneurysm or more likely infundibulum is present at the left posterior communicating artery. The left A1 segment is dominant to the right. There is no focal stenosis. Mild irregularity is present in the M1 segments bilaterally without significant stenoses. The MCA bifurcations are intact. There is high-grade stenosis or occlusion of the anterior inferior left M2 segment which may associated with the infarct. A moderate to severe stenosis is present at the right vertebral artery a proximal to the PICA origin. The left vertebral artery is the dominant vessel. PICA origins are intact bilaterally. The basilar artery is normal. Both posterior cerebral arteries originate from the basilar tip. There is tapering of distal PCA branch vessels bilaterally. IMPRESSION: 1. Acute nonhemorrhagic infarct of the left basal ganglia extending to the corona radiata measures 3.5 cm in cephalo caudad dimension. 2. Lacunar infarcts of the bilateral basal ganglia are remote. 3. High-grade stenosis or occlusion of the anterior inferior left M2 segment, likely related to the infarct territory. 4. Moderate to high-grade stenosis of the proximal right vertebral artery with distal flow beyond  the PICA. Electronically Signed: By:  Marin Roberts M.D. On: 03/20/2017 11:42   Ct Abdomen Pelvis W Contrast  Result Date: 03/18/2017 CLINICAL DATA:  LEFT lower quadrant pain and emesis since colonoscopy on Tuesday, multiple falls since Wednesday EXAM: CT ABDOMEN AND PELVIS WITH CONTRAST TECHNIQUE: Multidetector CT imaging of the abdomen and pelvis was performed using the standard protocol following bolus administration of intravenous contrast. Sagittal and coronal MPR images reconstructed from axial data set. CONTRAST:  ISOVUE-300 IOPAMIDOL (ISOVUE-300) INJECTION 61% IV. No oral contrast. COMPARISON:  None. FINDINGS: Lower chest: Dependent atelectasis at lung bases. Question atelectasis versus 8 mm nodule at medial RIGHT middle lobe image 3. Hepatobiliary: 7 mm nonspecific intermediate attenuation focus superiorly at dome of liver image 8. No additional focal abnormalities of the liver or gallbladder. Pancreas: Normal appearance Spleen: Normal appearance Adrenals/Urinary Tract: Adrenal glands, RIGHT kidney, RIGHT ureter, and bladder normal appearance. LEFT hydronephrosis with delay in LEFT nephrogram, LEFT perinephric edema, and proximal LEFT ureteral dilatation secondary to a 7 x 8 x 13 mm proximal LEFT ureteral calculus. Distal LEFT ureter decompressed. Stomach/Bowel: Normal appearance Vascular/Lymphatic: Atherosclerotic calcification aorta and iliac arteries without aneurysm. No adenopathy. Reproductive: Mild prostatic enlargement, gland 5.4 x 3.7 cm image 81. Seminal vesicles unremarkable. Other: No free air free fluid.  No hernia. Musculoskeletal: Prior lumbar fusion L3-L4 with failure of incorporation of the disc prosthesis and associated irregular vertebral endplates. Bones demineralized. IMPRESSION: LEFT hydronephrosis and proximal LEFT hydroureter with associated impairment of LEFT nephrogram secondary to an obstructing 7 x 8 x 13 mm proximal LEFT ureteral calculus. Nonspecific 7 mm intermediate attenuation lesion within the  liver ; recommend follow-up MR imaging in 6 months to assess stability and characterize. Prostatic enlargement. Question 8 mm RIGHT middle lobe nodule, recommendation below. Non-contrast chest CT at 6-12 months is recommended. If the nodule is stable at time of repeat CT, then future CT at 18-24 months (from today's scan) is considered optional for low-risk patients, but is recommended for high-risk patients. This recommendation follows the consensus statement: Guidelines for Management of Incidental Pulmonary Nodules Detected on CT Images: From the Fleischner Society 2017; Radiology 2017; 284:228-243. Aortic Atherosclerosis (ICD10-I70.0). Electronically Signed   By: Ulyses Southward M.D.   On: 03/18/2017 13:48   US Carotid Bilateral (at [facility] And [facility] Only)  Result Date: 03/20/2017 CLINICAL DATA:  Right-sided weakness, right-sided facial droop acute cerebral infarction. EXAM: BILATERAL CAROTID DUPLEX ULTRASOUND TECHNIQUE: Wallace Cullens scale imaging, color Doppler and duplex ultrasound were performed of bilateral carotid and vertebral arteries in the neck. COMPARISON:  None. FINDINGS: Criteria: Quantification of carotid stenosis is based on velocity parameters that correlate the residual internal carotid diameter with NASCET-based stenosis levels, using the diameter of the distal internal carotid lumen as the denominator for stenosis measurement. The following velocity measurements were obtained: RIGHT ICA:  114/38 cm/sec CCA:  138/34 cm/sec SYSTOLIC ICA/CCA RATIO:  0.8 DIASTOLIC ICA/CCA RATIO:  1.1 ECA:  159 cm/sec LEFT ICA:  114/41 cm/sec CCA:  113/28 cm/sec SYSTOLIC ICA/CCA RATIO:  1.0 DIASTOLIC ICA/CCA RATIO:  1.5 ECA:  151 cm/sec RIGHT CAROTID ARTERY: Intimal thickening present without focal plaque. There is no evidence of right ICA stenosis. RIGHT VERTEBRAL ARTERY: Antegrade flow with normal waveform and velocity. LEFT CAROTID ARTERY: Small amount of calcified plaque is present at the level of the left carotid bulb. No  evidence of ICA plaque or stenosis. LEFT VERTEBRAL ARTERY: Antegrade flow with normal waveform and velocity. IMPRESSION: Small amount of plaque at the level  of the left carotid bulb. No evidence of plaque in either internal carotid artery or evidence of ICA stenosis bilaterally. Electronically Signed   By: Aletta Edouard M.D.   On: 03/20/2017 18:29   Dg C-arm 1-60 Min-no Report  Result Date: 03/23/2017 Fluoroscopy was utilized by the requesting physician.  No radiographic interpretation.   Ct Head Code Stroke Wo Contrast`  Result Date: 04/12/2017 CLINICAL DATA:  Code stroke. These results were called by telephone at the time of interpretation on 04/12/2017 at 1:05 am to Dr. Joseph Berkshire , who verbally acknowledged these results. Right-sided numbness. EXAM: CT HEAD WITHOUT CONTRAST TECHNIQUE: Contiguous axial images were obtained from the base of the skull through the vertex without intravenous contrast. COMPARISON:  03/20/2017 CT head FINDINGS: Brain: No evidence of acute infarction, hemorrhage, hydrocephalus, extra-axial collection or mass lesion/mass effect. The chronic lacunar infarcts are present within the bilateral caudate heads, bilateral putamen, and left caudate body/ anterior corona radiata. Additionally, there are small chronic periventricular infarcts within the bilateral frontal lobes. Vascular: The calcific atherosclerosis of carotid siphons. No hyperdense vessel identified. Skull: Normal. Negative for fracture or focal lesion. Sinuses/Orbits: No acute finding. Other: None. ASPECTS [facility] Stroke Program Early CT Score) - Ganglionic level infarction (caudate, lentiform nuclei, internal capsule, insula, M1-M3 cortex): 7 - Supraganglionic infarction (M4-M6 cortex): 3 Total score (0-10 with 10 being normal): 10 IMPRESSION: 1. No acute intracranial abnormality identified. 2. ASPECTS is 10 3. Multiple stable chronic lacunar infarcts in basal ganglia and periventricular white matter. These  results were called by telephone at the time of interpretation on 04/12/2017 at 1:08 am to Dr. Joseph Berkshire , who verbally acknowledged these results. Electronically Signed   By: Kristine Garbe M.D.   On: 04/12/2017 01:10       Filed Weights   04/12/17 0039 04/12/17 0522  Weight: 72.6 kg (160 lb) 72.5 kg (159 lb 13.7 oz)     Microbiology: No results found for this or any previous visit (from the past 240 hour(s)).     Blood Culture No results found for: SDES, SPECREQUEST, CULT, REPTSTATUS    Labs: Results for orders placed or performed during the hospital encounter of 04/12/17 (from the past 48 hour(s))  CBG monitoring, ED     Status: Abnormal   Collection Time: 04/12/17 12:40 AM  Result Value Ref Range   Glucose-Capillary 113 (H) 65 - 99 mg/dL  Ethanol     Status: None   Collection Time: 04/12/17 12:40 AM  Result Value Ref Range   Alcohol, Ethyl (B) <5 <5 mg/dL    Comment:        LOWEST DETECTABLE LIMIT FOR SERUM ALCOHOL IS 5 mg/dL FOR MEDICAL PURPOSES ONLY   Protime-INR     Status: None   Collection Time: 04/12/17 12:40 AM  Result Value Ref Range   Prothrombin Time 12.6 11.4 - 15.2 seconds   INR 0.95   APTT     Status: None   Collection Time: 04/12/17 12:40 AM  Result Value Ref Range   aPTT 27 24 - 36 seconds  CBC     Status: Abnormal   Collection Time: 04/12/17 12:40 AM  Result Value Ref Range   WBC 11.2 (H) 4.0 - 10.5 K/uL   RBC 4.60 4.22 - 5.81 MIL/uL   Hemoglobin 14.0 13.0 - 17.0 g/dL   HCT 42.0 39.0 - 52.0 %   MCV 91.3 78.0 - 100.0 fL   MCH 30.4 26.0 - 34.0 pg   MCHC 33.3 30.0 -  36.0 g/dL   RDW 13.5 11.5 - 15.5 %   Platelets 382 150 - 400 K/uL  Differential     Status: None   Collection Time: 04/12/17 12:40 AM  Result Value Ref Range   Neutrophils Relative % 68 %   Neutro Abs 7.6 1.7 - 7.7 K/uL   Lymphocytes Relative 24 %   Lymphs Abs 2.7 0.7 - 4.0 K/uL   Monocytes Relative 7 %   Monocytes Absolute 0.7 0.1 - 1.0 K/uL    Eosinophils Relative 1 %   Eosinophils Absolute 0.1 0.0 - 0.7 K/uL   Basophils Relative 0 %   Basophils Absolute 0.0 0.0 - 0.1 K/uL  Comprehensive metabolic panel     Status: Abnormal   Collection Time: 04/12/17 12:40 AM  Result Value Ref Range   Sodium 137 135 - 145 mmol/L   Potassium 3.8 3.5 - 5.1 mmol/L   Chloride 104 101 - 111 mmol/L   CO2 24 22 - 32 mmol/L   Glucose, Bld 111 (H) 65 - 99 mg/dL   BUN 15 6 - 20 mg/dL   Creatinine, Ser 0.92 0.61 - 1.24 mg/dL   Calcium 9.1 8.9 - 10.3 mg/dL   Total Protein 7.4 6.5 - 8.1 g/dL   Albumin 4.4 3.5 - 5.0 g/dL   AST 24 15 - 41 U/L   ALT 30 17 - 63 U/L   Alkaline Phosphatase 103 38 - 126 U/L   Total Bilirubin 0.6 0.3 - 1.2 mg/dL   GFR calc non Af Amer >60 >60 mL/min   GFR calc Af Amer >60 >60 mL/min    Comment: (NOTE) The eGFR has been calculated using the CKD EPI equation. This calculation has not been validated in all clinical situations. eGFR's persistently <60 mL/min signify possible Chronic Kidney Disease.    Anion gap 9 5 - 15  I-Stat Chem 8, ED     Status: Abnormal   Collection Time: 04/12/17 12:46 AM  Result Value Ref Range   Sodium 143 135 - 145 mmol/L   Potassium 4.1 3.5 - 5.1 mmol/L   Chloride 107 101 - 111 mmol/L   BUN 17 6 - 20 mg/dL   Creatinine, Ser 0.80 0.61 - 1.24 mg/dL   Glucose, Bld 106 (H) 65 - 99 mg/dL   Calcium, Ion 1.08 (L) 1.15 - 1.40 mmol/L   TCO2 26 22 - 32 mmol/L   Hemoglobin 15.0 13.0 - 17.0 g/dL   HCT 44.0 39.0 - 52.0 %  I-stat troponin, ED     Status: None   Collection Time: 04/12/17 12:46 AM  Result Value Ref Range   Troponin i, poc 0.00 0.00 - 0.08 ng/mL   Comment 3            Comment: Due to the release kinetics of cTnI, a negative result within the first hours of the onset of symptoms does not rule out myocardial infarction with certainty. If myocardial infarction is still suspected, repeat the test at appropriate intervals.   Urine rapid drug screen (hosp performed)     Status: Abnormal    Collection Time: 04/12/17  2:05 AM  Result Value Ref Range   Opiates POSITIVE (A) NONE DETECTED   Cocaine NONE DETECTED NONE DETECTED   Benzodiazepines NONE DETECTED NONE DETECTED   Amphetamines NONE DETECTED NONE DETECTED   Tetrahydrocannabinol NONE DETECTED NONE DETECTED   Barbiturates NONE DETECTED NONE DETECTED    Comment:        DRUG SCREEN FOR MEDICAL PURPOSES ONLY.  IF CONFIRMATION IS NEEDED FOR ANY PURPOSE, NOTIFY LAB WITHIN 5 DAYS.        LOWEST DETECTABLE LIMITS FOR URINE DRUG SCREEN Drug Class       Cutoff (ng/mL) Amphetamine      1000 Barbiturate      200 Benzodiazepine   200 Tricyclics       300 Opiates          300 Cocaine          300 THC              50   Urinalysis, Routine w reflex microscopic     Status: Abnormal   Collection Time: 04/12/17  2:05 AM  Result Value Ref Range   Color, Urine RED (A) YELLOW    Comment: BIOCHEMICALS MAY BE AFFECTED BY COLOR CORRECTED ON 09/05 AT 0315: PREVIOUSLY REPORTED AS RED    APPearance CLOUDY (A) CLEAR   Specific Gravity, Urine 1.025 1.005 - 1.030    Comment: CORRECTED ON 09/06 AT 1012: PREVIOUSLY REPORTED AS TEST NOT REPORTED DUE TO COLOR INTERFERENCE OF URINE PIGMENT, CORRECTED ON 09/05 AT 0315: PREVIOUSLY REPORTED AS BIOCHEMICALS MAY BE AFFECTED BY COLOR   pH 6.5 5.0 - 8.0    Comment: CORRECTED ON 09/06 AT 1012: PREVIOUSLY REPORTED AS TEST NOT REPORTED DUE TO COLOR INTERFERENCE OF URINE PIGMENT, CORRECTED ON 09/05 AT 0315: PREVIOUSLY REPORTED AS BIOCHEMICALS MAY BE AFFECTED BY COLOR   Glucose, UA (A) NEGATIVE mg/dL    TEST NOT REPORTED DUE TO COLOR INTERFERENCE OF URINE PIGMENT    Comment: CORRECTED ON 09/05 AT 0315: PREVIOUSLY REPORTED AS BIOCHEMICALS MAY BE AFFECTED BY COLOR   Hgb urine dipstick (A) NEGATIVE    TEST NOT REPORTED DUE TO COLOR INTERFERENCE OF URINE PIGMENT    Comment: CORRECTED ON 09/05 AT 0315: PREVIOUSLY REPORTED AS BIOCHEMICALS MAY BE AFFECTED BY COLOR   Bilirubin Urine (A) NEGATIVE    TEST NOT  REPORTED DUE TO COLOR INTERFERENCE OF URINE PIGMENT    Comment: CORRECTED ON 09/05 AT 0315: PREVIOUSLY REPORTED AS BIOCHEMICALS MAY BE AFFECTED BY COLOR   Ketones, ur (A) NEGATIVE mg/dL    TEST NOT REPORTED DUE TO COLOR INTERFERENCE OF URINE PIGMENT    Comment: CORRECTED ON 09/05 AT 0315: PREVIOUSLY REPORTED AS BIOCHEMICALS MAY BE AFFECTED BY COLOR   Protein, ur (A) NEGATIVE mg/dL    TEST NOT REPORTED DUE TO COLOR INTERFERENCE OF URINE PIGMENT    Comment: CORRECTED ON 09/05 AT 0315: PREVIOUSLY REPORTED AS BIOCHEMICALS MAY BE AFFECTED BY COLOR   Nitrite (A) NEGATIVE    TEST NOT REPORTED DUE TO COLOR INTERFERENCE OF URINE PIGMENT    Comment: CORRECTED ON 09/05 AT 0315: PREVIOUSLY REPORTED AS BIOCHEMICALS MAY BE AFFECTED BY COLOR   Leukocytes, UA (A) NEGATIVE    TEST NOT REPORTED DUE TO COLOR INTERFERENCE OF URINE PIGMENT    Comment: CORRECTED ON 09/05 AT 0315: PREVIOUSLY REPORTED AS BIOCHEMICALS MAY BE AFFECTED BY COLOR  Urinalysis, Microscopic (reflex)     Status: Abnormal   Collection Time: 04/12/17  2:05 AM  Result Value Ref Range   RBC / HPF TOO NUMEROUS TO COUNT 0 - 5 RBC/hpf   WBC, UA 6-30 0 - 5 WBC/hpf   Bacteria, UA RARE (A) NONE SEEN   Squamous Epithelial / LPF 0-5 (A) NONE SEEN   Urine-Other      ADMENDED REPORT CALLED TO HAYMORE,R @ 0315 BY MATTHEWS, B 9.5.18 BIOCHEMICAL MAY BE AFFECTED BY COLOR CHANGED TO tEST  NOT PERFORMED DUE TO COLOR INTERFERENCE  CBC     Status: None   Collection Time: 04/13/17  6:58 AM  Result Value Ref Range   WBC 10.1 4.0 - 10.5 K/uL   RBC 4.66 4.22 - 5.81 MIL/uL   Hemoglobin 14.5 13.0 - 17.0 g/dL   HCT 79.9 99.5 - 14.5 %   MCV 90.8 78.0 - 100.0 fL   MCH 31.1 26.0 - 34.0 pg   MCHC 34.3 30.0 - 36.0 g/dL   RDW 89.3 58.2 - 51.4 %   Platelets 334 150 - 400 K/uL  Comprehensive metabolic panel     Status: Abnormal   Collection Time: 04/13/17  6:58 AM  Result Value Ref Range   Sodium 139 135 - 145 mmol/L   Potassium 3.7 3.5 - 5.1 mmol/L   Chloride  106 101 - 111 mmol/L   CO2 24 22 - 32 mmol/L   Glucose, Bld 108 (H) 65 - 99 mg/dL   BUN 10 6 - 20 mg/dL   Creatinine, Ser 9.94 0.61 - 1.24 mg/dL   Calcium 9.2 8.9 - 30.3 mg/dL   Total Protein 7.0 6.5 - 8.1 g/dL   Albumin 4.2 3.5 - 5.0 g/dL   AST 31 15 - 41 U/L   ALT 36 17 - 63 U/L   Alkaline Phosphatase 98 38 - 126 U/L   Total Bilirubin 0.8 0.3 - 1.2 mg/dL   GFR calc non Af Amer >60 >60 mL/min   GFR calc Af Amer >60 >60 mL/min    Comment: (NOTE) The eGFR has been calculated using the CKD EPI equation. This calculation has not been validated in all clinical situations. eGFR's persistently <60 mL/min signify possible Chronic Kidney Disease.    Anion gap 9 5 - 15     Lipid Panel     Component Value Date/Time   CHOL 141 03/21/2017 0509   TRIG 80 03/21/2017 0509   HDL 36 (L) 03/21/2017 0509   CHOLHDL 3.9 03/21/2017 0509   VLDL 16 03/21/2017 0509   LDLCALC 89 03/21/2017 0509     Lab Results  Component Value Date   HGBA1C 5.5 03/21/2017     Lab Results  Component Value Date   LDLCALC 89 03/21/2017   CREATININE 0.82 04/13/2017     HPI :   60 y.o.malewith history of stroke recently discharged 2 weeks ago presents to the ER with complaints of sudden onset of numbness and weakness of the left upper extremity around 9:30 PM last night (04/11/2017). Patient denies any difficulty speaking and swallowing or any visual symptoms. Patient was admitted 2 weeks ago for stroke with right-sided weakness and at that time patient had stroke workup and was placed on aspirin and Plavix and statins. in the ER on exam patient has left upper extremity pronator drift with weakness. Neurology recommended  no thrombolyticssince patient had recent stroke and advised to get CT angiogram of the head and neck  . Patient is being admitted for further management of possible stroke.   Patient recently also had cystoscopy with stentplacement on the left on 8/16 which was 2 days after his  stroke Reconsulted neurology  For Small acute right medullary infarct with no associated hemorrhage or mass effect. This is likely associated with the severe distal right vertebral artery stenosis demonstrated today by CTA per MRI report .  Hospital course  1. Acute lacunar infarct involving the right medullary -  . Patient passed swallow will continue with aspirin 325 mg a and Plavix  75 mg a day along with maximum dose of Lipitor 80 mg a day. Appreciate neurology input. Aspirin neurology no interventional procedures are required. Neurology recommends holding off on any elective surgery for at least one month. Will not repeat 2-D echo as the patient recently had one. Patient would need CIR.  Patient would benefit from outpatient [facility] stroke specialist in 4-6 weeks 2. History of hepatitis C per the chart. 3. Tobacco abuse - tobacco cessation counseling requested. 4. Recent intervention for left ureteral stone. 5. 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 Indeterminant liver lesion seen during last admission will need further workup as outpatient.      Discharge Exam:   Blood pressure (!) 166/89, pulse 88, temperature 98 F (36.7 C), temperature source Oral, resp. rate 16, height '5\' 11"'$  (1.803 m), weight 72.5 kg (159 lb 13.7 oz), SpO2 97 %.  General exam: Appears calm and comfortable  Respiratory system: Clear to auscultation. Respiratory effort normal. Cardiovascular system: S1 & S2 heard, RRR. No JVD, murmurs, rubs, gallops or clicks. No pedal edema. Gastrointestinal system: Abdomen is nondistended, soft and nontender. No organomegaly or masses felt. Normal bowel sounds heard. Central nervous system: Dysarthria. Extremities: Symmetric 5 x 5 power. Skin: No rashes, lesions or ulcers Psychiatry: Judgement and insight appear normal. Mood & affect  appropriate    Follow-up Information    [employer], MD. Call.   Specialty:  Internal Medicine Why:  in 3-5 days  Contact information: [address] [place] [address] [phone]           Signed: Reyne Dumas 04/13/2017, 11:24 AM        Time spent >1 hour

## 2017-04-13 NOTE — Progress Notes (Signed)
Inpatient Rehabilitation  I have received insurance authorization, acute medical clearance, and have an IP Rehab bed to offer today.  Discussed with RN case manager that we can admit patient by 5pm.  She will arrange transport and update patient and family.  Will proceed with admission today.  Call if questions.    Charlane FerrettiMelissa Burris Matherne, M.A., CCC/SLP Admission Coordinator  [facility]  Cell [phone]

## 2017-04-13 NOTE — H&P (Signed)
Physical Medicine and Rehabilitation Admission H&P         Chief Complaint  Patient presents with  . Numbness      left sided  : HPI: 61 year old right-handed male with history of tobacco abuse, hepatitis C and recent CVA with right hemiparesis due to left MCA infarct admitted 03/20/2017 discharge 03/21/2017 ambulating minimal assist to 100 feet using a rolling walker. Discharged on aspirin and Plavix. Per chart review patient lives with family. Was using a walker prior to admission. 2 level home with bath and bedroom on main level and 5 steps to entry of home. Family can assist as needed. He was able to complete ADL tasks with supervision for safety. He was receiving home therapies with [facility] home [facility]. Patient was doing well until 04/12/2017 with left-sided weakness and numbness as well as severe dysarthria. CT/MRI showed small acute right medullary infarct no associated hemorrhage or mass effect. Evolution of left corona radiata basal ganglia infarct since 03/20/2017. CT angiogram head and neck with no dissection or aneurysm. No large vessel occlusion. Recent echocardiogram with ejection fraction of 65% overall motion abnormality. Patient also with recent left ureteral stone seen by urology Dr. Haskel Schroeder underwent a cystoscopy 03/23/2017 showing moderate hydronephrosis no masses or lesions. Plan was for stone extraction in approximately 4 weeks. He had been placed on Flomax. Patient currently remains on aspirin and Plavix as prior to admission. Subcutaneous Lovenox for DVT prophylaxis. Mechanical soft thin liquid diet. Physical therapy evaluation completed 04/12/2017 with recommendations of physical medicine rehabilitation consult. Patient was admitted for comprehensive rehabilitation program.  Patient feels like the left side is weaker than the right side Discussed with nursing, he does have hematuria which was reported also at [facility] Follows with urology.   Review of Systems   Constitutional: Negative for chills and fever.  HENT: Negative for hearing loss.   Eyes: Negative for blurred vision and double vision.  Respiratory: Positive for cough. Negative for shortness of breath.   Cardiovascular: Positive for leg swelling. Negative for chest pain and palpitations.  Gastrointestinal: Positive for constipation. Negative for nausea and vomiting.  Genitourinary: Positive for dysuria, flank pain and urgency.  Musculoskeletal: Positive for back pain.  Skin: Negative for rash.  Neurological: Positive for weakness. Negative for seizures.  Psychiatric/Behavioral:       Anxiety  All other systems reviewed and are negative.       Past Medical History:  Diagnosis Date  . Anxiety    . Chronic back pain    . CVA (cerebral vascular accident) ([place]) 03/20/2017  . Hepatitis C      HEP C, never treated, 2007    Past Surgical History:  Procedure Laterality Date  . CERVICAL DISC SURGERY   2001    anterior  . COLONOSCOPY WITH PROPOFOL N/A 03/14/2017    Procedure: COLONOSCOPY WITH PROPOFOL;  Surgeon: Danie Binder, MD;  Location: [facility];  Service: Endoscopy;  Laterality: N/A;  8:15am  . CYSTOSCOPY W/ URETERAL STENT PLACEMENT Left 03/23/2017    Procedure: CYSTOSCOPY WITH LEFT RETROGRADE PYELOGRAM/URETERAL LEFT STENT PLACEMENT;  Surgeon: Cleon Gustin, MD;  Location: [facility];  Service: Urology;  Laterality: Left;  . LUMBAR SPINE SURGERY   2015  . POLYPECTOMY   03/14/2017    Procedure: POLYPECTOMY;  Surgeon: Danie Binder, MD;  Location: [facility];  Service: Endoscopy;;  rectum         Family History  Problem Relation Age of Onset  . Cancer Mother    .  Colon cancer Neg Hx      Social History:  reports that he has quit smoking. His smoking use included Cigarettes. He has a 25.00 pack-year smoking history. He has never used smokeless tobacco. He reports that he does not drink alcohol or use drugs. Allergies:      Allergies  Allergen Reactions  . Codeine  Itching and Nausea Only          Medications Prior to Admission  Medication Sig Dispense Refill  . aspirin 325 MG tablet Take 1 tablet (325 mg total) by mouth daily. 30 tablet 12  . atorvastatin (LIPITOR) 80 MG tablet Take 1 tablet (80 mg total) by mouth daily at 6 PM. 30 tablet 3  . clopidogrel (PLAVIX) 75 MG tablet Take 1 tablet (75 mg total) by mouth daily with breakfast. 30 tablet 3  . ondansetron (ZOFRAN ODT) 8 MG disintegrating tablet Take 1 tablet (8 mg total) by mouth every 8 (eight) hours as needed for nausea or vomiting. 20 tablet 0  . tamsulosin (FLOMAX) 0.4 MG CAPS capsule Take 1 capsule (0.4 mg total) by mouth daily. 30 capsule 0  . [DISCONTINUED] ALPRAZolam (XANAX) 0.5 MG tablet Take 0.5 mg by mouth 3 (three) times daily as needed for anxiety.      . [DISCONTINUED] HYDROcodone-acetaminophen (NORCO) 10-325 MG tablet Take 1 tablet by mouth 4 (four) times daily.           Drug Regimen Review  Drug regimen was reviewed and remains appropriate with no significant issues identified   Home: Home Living Family/patient expects to be discharged to:: Private residence Living Arrangements: Children Available Help at Discharge: Family, Available 24 hours/day Type of Home: House Home Access: Stairs to enter [employer] of Steps: 5 in front rails to wide to use both, 17 step to 2nd floor in house with siderail on the left Entrance Stairs-Rails: Right, Left, Can reach both Home Layout: Two level, Able to live on main level with bedroom/bathroom, Bed/bath upstairs Alternate Level Stairs-Number of Steps: full flight  Alternate Level Stairs-Rails: Left Bathroom Shower/Tub: Multimedia programmer: Standard Bathroom Accessibility: Yes Home Equipment: Environmental consultant - standard  Lives With: Son   Functional History: Prior Function Level of Independence: Needs assistance Gait / Transfers Assistance Needed: Since admission for CVA 2 weeks ago pt has been using RW for functional  mobility purposes ADL's / Homemaking Assistance Needed: Pt has been completing ADL tasks with supervision for safety.  Comments: Pt receiving PT/OT/RN services from [facility], reports this Friday was supposed to be his last day with them   Functional Status:  Mobility: Bed Mobility Overal bed mobility: Needs Assistance Bed Mobility: Supine to Sit, Sit to Supine Supine to sit: Min assist Sit to supine: Min assist General bed mobility comments: frequent falling backwards due to trunk weakness Transfers Overall transfer level: Needs assistance Equipment used: Rolling walker (2 wheeled) Transfers: Sit to/from Stand, [employer] Transfers Sit to Stand: Mod assist Stand pivot transfers: Mod assist General transfer comment: Pt able to maintain standing for short periods with mod assist, max assist for stand-pivot transfer. Pt has significant difficulty with side-stepping, able to bear weight on LLE when stepping back with RLE. Verbal cuing for sequencing.  Ambulation/Gait Ambulation/Gait assistance: Max assist, Mod assist Ambulation Distance (Feet): 10 Feet Assistive device: Rolling walker (2 wheeled) Gait Pattern/deviations: Decreased stance time - right, Decreased stance time - left, Decreased step length - left, Decreased step length - right, Decreased stride length, Leaning posteriorly, Staggering  left, Staggering right General Gait Details: frequent scissoring of legs, especially when stepping backwards Gait velocity interpretation: Below normal speed for age/gender   ADL: ADL Overall ADL's : Needs assistance/impaired Eating/Feeding: Set up, Bed level Eating/Feeding Details (indicate cue type and reason): Pt using BUE for feeding tasks this am, using RUE as dominant and LUE as assist. Requiring assistance for opening butter and bowl containing apples.  Grooming: Wash/dry hands, Wash/dry face, Sitting Grooming Details (indicate cue type and reason): Difficulty with strength and  coordination of task with LUE. Unable to complete in standing due to LLE weakness and balance deficits Upper Body Bathing: Moderate assistance, Sitting Lower Body Bathing: Maximal assistance, Sitting/lateral leans Lower Body Bathing Details (indicate cue type and reason): Pt limited due to sitting balance deficits-able to sit upright at EOB with BUE support and cuing for trunk control. Exhibits left posterior lean without BUE support Upper Body Dressing : Maximal assistance, Sitting Upper Body Dressing Details (indicate cue type and reason): Assist for threading arms and managing buttons/ties Lower Body Dressing: Maximal assistance, Sitting/lateral leans Lower Body Dressing Details (indicate cue type and reason): Requiring assistance due to sitting balance deficits. OT providing assist with socks, cuing pt for seated balance and trunk control, improved sitting balance today Toilet Transfer: Moderate assistance, Cueing for safety, Cueing for sequencing, Stand-pivot, BSC, RW Toilet Transfer Details (indicate cue type and reason): Pt requiring assistance due to LLE and LUE weakness, able to grasp RW with LUE and provide limited support. Moderate assistance provided due to LLE weakness and buckling Toileting- Clothing Manipulation and Hygiene: Maximal assistance, Sitting/lateral lean General ADL Comments: Pt requiring increased support for all ADL tasks due to left sided weakness, coordination, and balance deficits.    Cognition: Cognition Overall Cognitive Status: Within Functional Limits for tasks assessed Arousal/Alertness: Awake/alert Orientation Level: Oriented X4 Memory: Appears intact Awareness: Appears intact Problem Solving: Appears intact Safety/Judgment: Appears intact Cognition Arousal/Alertness: Awake/alert Behavior During Therapy: WFL for tasks assessed/performed Overall Cognitive Status: Within Functional Limits for tasks assessed General Comments: patient appears slightly  anxious    Physical Exam: Blood pressure (!) 160/80, pulse 97, temperature 98.1 F (36.7 C), temperature source Oral, resp. rate 18, height 5' 11" (1.803 m), weight 72.5 kg (159 lb 13.7 oz), SpO2 99 %. Physical Exam  Vitals reviewed. HENT:  Head: Normocephalic.  Eyes:  Pupils reactive to light  Neck: Neck supple. No thyromegaly present.  Cardiovascular: Normal rate, regular rhythm and normal heart sounds.   Respiratory:  Some decreased breath sounds at the bases but clear to auscultation  GI: Soft. Bowel sounds are normal. He exhibits no distension.  Skin. Intact Neurological. Patient is alert and severely dysarthric. He did follow simple commands. Appears to be a bit impulsive. Removed his Hep-Lock. Left forearm . He does withdraw to deep stimuli. Right upper extremity4 minus out of 5 would decreased fine motor skills. Right lower extremity 4 minus out of 5. Left upper extremity3-/5 LLE 2/5   sensation intact to light touch bilateral upper and lower limbs     Lab Results Last 48 Hours  Results for orders placed or performed during the hospital encounter of 04/12/17 (from the past 48 hour(s))  CBG monitoring, ED     Status: Abnormal    Collection Time: 04/12/17 12:40 AM  Result Value Ref Range    Glucose-Capillary 113 (H) 65 - 99 mg/dL  Ethanol     Status: None    Collection Time: 04/12/17 12:40 AM  Result Value Ref Range  Alcohol, Ethyl (B) <5 <5 mg/dL      Comment:        LOWEST DETECTABLE LIMIT FOR SERUM ALCOHOL IS 5 mg/dL FOR MEDICAL PURPOSES ONLY    Protime-INR     Status: None    Collection Time: 04/12/17 12:40 AM  Result Value Ref Range    Prothrombin Time 12.6 11.4 - 15.2 seconds    INR 0.95    APTT     Status: None    Collection Time: 04/12/17 12:40 AM  Result Value Ref Range    aPTT 27 24 - 36 seconds  CBC     Status: Abnormal    Collection Time: 04/12/17 12:40 AM  Result Value Ref Range    WBC 11.2 (H) 4.0 - 10.5 K/uL    RBC 4.60 4.22 - 5.81 MIL/uL     Hemoglobin 14.0 13.0 - 17.0 g/dL    HCT 42.0 39.0 - 52.0 %    MCV 91.3 78.0 - 100.0 fL    MCH 30.4 26.0 - 34.0 pg    MCHC 33.3 30.0 - 36.0 g/dL    RDW 13.5 11.5 - 15.5 %    Platelets 382 150 - 400 K/uL  Differential     Status: None    Collection Time: 04/12/17 12:40 AM  Result Value Ref Range    Neutrophils Relative % 68 %    Neutro Abs 7.6 1.7 - 7.7 K/uL    Lymphocytes Relative 24 %    Lymphs Abs 2.7 0.7 - 4.0 K/uL    Monocytes Relative 7 %    Monocytes Absolute 0.7 0.1 - 1.0 K/uL    Eosinophils Relative 1 %    Eosinophils Absolute 0.1 0.0 - 0.7 K/uL    Basophils Relative 0 %    Basophils Absolute 0.0 0.0 - 0.1 K/uL  Comprehensive metabolic panel     Status: Abnormal    Collection Time: 04/12/17 12:40 AM  Result Value Ref Range    Sodium 137 135 - 145 mmol/L    Potassium 3.8 3.5 - 5.1 mmol/L    Chloride 104 101 - 111 mmol/L    CO2 24 22 - 32 mmol/L    Glucose, Bld 111 (H) 65 - 99 mg/dL    BUN 15 6 - 20 mg/dL    Creatinine, Ser 0.92 0.61 - 1.24 mg/dL    Calcium 9.1 8.9 - 10.3 mg/dL    Total Protein 7.4 6.5 - 8.1 g/dL    Albumin 4.4 3.5 - 5.0 g/dL    AST 24 15 - 41 U/L    ALT 30 17 - 63 U/L    Alkaline Phosphatase 103 38 - 126 U/L    Total Bilirubin 0.6 0.3 - 1.2 mg/dL    GFR calc non Af Amer >60 >60 mL/min    GFR calc Af Amer >60 >60 mL/min      Comment: (NOTE) The eGFR has been calculated using the CKD EPI equation. This calculation has not been validated in all clinical situations. eGFR's persistently <60 mL/min signify possible Chronic Kidney Disease.      Anion gap 9 5 - 15  I-Stat Chem 8, ED     Status: Abnormal    Collection Time: 04/12/17 12:46 AM  Result Value Ref Range    Sodium 143 135 - 145 mmol/L    Potassium 4.1 3.5 - 5.1 mmol/L    Chloride 107 101 - 111 mmol/L    BUN 17 6 - 20 mg/dL    Creatinine,  Ser 0.80 0.61 - 1.24 mg/dL    Glucose, Bld 106 (H) 65 - 99 mg/dL    Calcium, Ion 1.08 (L) 1.15 - 1.40 mmol/L    TCO2 26 22 - 32 mmol/L    Hemoglobin  15.0 13.0 - 17.0 g/dL    HCT 44.0 39.0 - 52.0 %  I-stat troponin, ED     Status: None    Collection Time: 04/12/17 12:46 AM  Result Value Ref Range    Troponin i, poc 0.00 0.00 - 0.08 ng/mL    Comment 3               Comment: Due to the release kinetics of cTnI, a negative result within the first hours of the onset of symptoms does not rule out myocardial infarction with certainty. If myocardial infarction is still suspected, repeat the test at appropriate intervals.    Urine rapid drug screen (hosp performed)     Status: Abnormal    Collection Time: 04/12/17  2:05 AM  Result Value Ref Range    Opiates POSITIVE (A) NONE DETECTED    Cocaine NONE DETECTED NONE DETECTED    Benzodiazepines NONE DETECTED NONE DETECTED    Amphetamines NONE DETECTED NONE DETECTED    Tetrahydrocannabinol NONE DETECTED NONE DETECTED    Barbiturates NONE DETECTED NONE DETECTED      Comment:        DRUG SCREEN FOR MEDICAL PURPOSES ONLY.  IF CONFIRMATION IS NEEDED FOR ANY PURPOSE, NOTIFY LAB WITHIN 5 DAYS.        LOWEST DETECTABLE LIMITS FOR URINE DRUG SCREEN Drug Class       Cutoff (ng/mL) Amphetamine      1000 Barbiturate      200 Benzodiazepine   951 Tricyclics       884 Opiates          300 Cocaine          300 THC              50    Urinalysis, Routine w reflex microscopic     Status: Abnormal    Collection Time: 04/12/17  2:05 AM  Result Value Ref Range    Color, Urine RED (A) YELLOW      Comment: BIOCHEMICALS MAY BE AFFECTED BY COLOR CORRECTED ON 09/05 AT 0315: PREVIOUSLY REPORTED AS RED      APPearance CLOUDY (A) CLEAR    Specific Gravity, Urine 1.025 1.005 - 1.030      Comment: CORRECTED ON 09/06 AT 1012: PREVIOUSLY REPORTED AS TEST NOT REPORTED DUE TO COLOR INTERFERENCE OF URINE PIGMENT, CORRECTED ON 09/05 AT 0315: PREVIOUSLY REPORTED AS BIOCHEMICALS MAY BE AFFECTED BY COLOR    pH 6.5 5.0 - 8.0      Comment: CORRECTED ON 09/06 AT 1012: PREVIOUSLY REPORTED AS TEST NOT REPORTED DUE TO  COLOR INTERFERENCE OF URINE PIGMENT, CORRECTED ON 09/05 AT 0315: PREVIOUSLY REPORTED AS BIOCHEMICALS MAY BE AFFECTED BY COLOR    Glucose, UA (A) NEGATIVE mg/dL      TEST NOT REPORTED DUE TO COLOR INTERFERENCE OF URINE PIGMENT      Comment: CORRECTED ON 09/05 AT 0315: PREVIOUSLY REPORTED AS BIOCHEMICALS MAY BE AFFECTED BY COLOR    Hgb urine dipstick (A) NEGATIVE      TEST NOT REPORTED DUE TO COLOR INTERFERENCE OF URINE PIGMENT      Comment: CORRECTED ON 09/05 AT 0315: PREVIOUSLY REPORTED AS BIOCHEMICALS MAY BE AFFECTED BY COLOR    Bilirubin Urine (A) NEGATIVE  TEST NOT REPORTED DUE TO COLOR INTERFERENCE OF URINE PIGMENT      Comment: CORRECTED ON 09/05 AT 0315: PREVIOUSLY REPORTED AS BIOCHEMICALS MAY BE AFFECTED BY COLOR    Ketones, ur (A) NEGATIVE mg/dL      TEST NOT REPORTED DUE TO COLOR INTERFERENCE OF URINE PIGMENT      Comment: CORRECTED ON 09/05 AT 0315: PREVIOUSLY REPORTED AS BIOCHEMICALS MAY BE AFFECTED BY COLOR    Protein, ur (A) NEGATIVE mg/dL      TEST NOT REPORTED DUE TO COLOR INTERFERENCE OF URINE PIGMENT      Comment: CORRECTED ON 09/05 AT 0315: PREVIOUSLY REPORTED AS BIOCHEMICALS MAY BE AFFECTED BY COLOR    Nitrite (A) NEGATIVE      TEST NOT REPORTED DUE TO COLOR INTERFERENCE OF URINE PIGMENT      Comment: CORRECTED ON 09/05 AT 0315: PREVIOUSLY REPORTED AS BIOCHEMICALS MAY BE AFFECTED BY COLOR    Leukocytes, UA (A) NEGATIVE      TEST NOT REPORTED DUE TO COLOR INTERFERENCE OF URINE PIGMENT      Comment: CORRECTED ON 09/05 AT 0315: PREVIOUSLY REPORTED AS BIOCHEMICALS MAY BE AFFECTED BY COLOR  Urinalysis, Microscopic (reflex)     Status: Abnormal    Collection Time: 04/12/17  2:05 AM  Result Value Ref Range    RBC / HPF TOO NUMEROUS TO COUNT 0 - 5 RBC/hpf    WBC, UA 6-30 0 - 5 WBC/hpf    Bacteria, UA RARE (A) NONE SEEN    Squamous Epithelial / LPF 0-5 (A) NONE SEEN    Urine-Other          ADMENDED REPORT CALLED TO HAYMORE,R @ 0315 BY MATTHEWS, B 9.5.18 BIOCHEMICAL MAY BE  AFFECTED BY COLOR CHANGED TO tEST NOT PERFORMED DUE TO COLOR INTERFERENCE  CBC     Status: None    Collection Time: 04/13/17  6:58 AM  Result Value Ref Range    WBC 10.1 4.0 - 10.5 K/uL    RBC 4.66 4.22 - 5.81 MIL/uL    Hemoglobin 14.5 13.0 - 17.0 g/dL    HCT 42.3 39.0 - 52.0 %    MCV 90.8 78.0 - 100.0 fL    MCH 31.1 26.0 - 34.0 pg    MCHC 34.3 30.0 - 36.0 g/dL    RDW 13.6 11.5 - 15.5 %    Platelets 334 150 - 400 K/uL  Comprehensive metabolic panel     Status: Abnormal    Collection Time: 04/13/17  6:58 AM  Result Value Ref Range    Sodium 139 135 - 145 mmol/L    Potassium 3.7 3.5 - 5.1 mmol/L    Chloride 106 101 - 111 mmol/L    CO2 24 22 - 32 mmol/L    Glucose, Bld 108 (H) 65 - 99 mg/dL    BUN 10 6 - 20 mg/dL    Creatinine, Ser 0.82 0.61 - 1.24 mg/dL    Calcium 9.2 8.9 - 10.3 mg/dL    Total Protein 7.0 6.5 - 8.1 g/dL    Albumin 4.2 3.5 - 5.0 g/dL    AST 31 15 - 41 U/L    ALT 36 17 - 63 U/L    Alkaline Phosphatase 98 38 - 126 U/L    Total Bilirubin 0.8 0.3 - 1.2 mg/dL    GFR calc non Af Amer >60 >60 mL/min    GFR calc Af Amer >60 >60 mL/min      Comment: (NOTE) The eGFR has been calculated using  the CKD EPI equation. This calculation has not been validated in all clinical situations. eGFR's persistently <60 mL/min signify possible Chronic Kidney Disease.      Anion gap 9 5 - 15       Imaging Results (Last 48 hours)  Ct Angio Head W Or Wo Contrast   Result Date: 04/12/2017 CLINICAL DATA:  61 y/o  M; 61 y/o  M; right-sided numbness. EXAM: CT ANGIOGRAPHY HEAD AND NECK TECHNIQUE: Multidetector CT imaging of the head and neck was performed using the standard protocol during bolus administration of intravenous contrast. Multiplanar CT image reconstructions and MIPs were obtained to evaluate the vascular anatomy. Carotid stenosis measurements (when applicable) are obtained utilizing NASCET criteria, using the distal internal carotid diameter as the denominator. CONTRAST:  100 cc  Isovue 370 COMPARISON:  04/12/2017 CT head.  03/20/2017 MRI head. FINDINGS: CTA NECK FINDINGS Aortic arch: Standard branching. Imaged portion shows no evidence of aneurysm or dissection. No significant stenosis of the major arch vessel origins. Right carotid system: No evidence of dissection, stenosis (50% or greater) or occlusion. Left carotid system: No evidence of dissection, stenosis (50% or greater) or occlusion. Vertebral arteries: Codominant. No evidence of dissection, stenosis (50% or greater) or occlusion. Skeleton: C5-C7 anterior cervical discectomy and fusion. Advanced cervical degenerative changes at the C3-4 level with severe disc space narrowing, a disc protrusion, and anterior marginal osteophytes. Other neck: 13 mm nodule within right lobe of thyroid. Upper chest: Negative. Review of the MIP images confirms the above findings CTA HEAD FINDINGS Anterior circulation: No significant stenosis, proximal occlusion, aneurysm, or vascular malformation. Posterior circulation: Tandem segments of severe stenosis of right vertebral artery. Patent left vertebral artery, basilar artery, and bilateral posterior cerebral arteries. No large vessel occlusion, aneurysm, or significant stenosis is identified. Venous sinuses: As permitted by contrast timing, patent. Anatomic variants: Anterior communicating artery and probable diminutive bilateral posterior communicating arteries. Delayed phase: No abnormal intracranial enhancement. Review of the MIP images confirms the above findings IMPRESSION: 1. Patent carotid and vertebral arteries in the neck. No dissection, aneurysm, or hemodynamically significant stenosis utilizing NASCET criteria. 2. Patent circle of Willis.  No large vessel occlusion or aneurysm. 3. Tandem segments of severe stenosis of right vertebral artery proximal to PICA origin. Otherwise no significant intracranial arterial stenosis. These results were called by telephone at the time of interpretation on  04/12/2017 at 2:28 am to Dr. Joseph Berkshire , who verbally acknowledged these results. Electronically Signed   By: Kristine Garbe M.D.   On: 04/12/2017 02:28    Ct Angio Neck W And/or Wo Contrast   Result Date: 04/12/2017 CLINICAL DATA:  61 y/o  M; 61 y/o  M; right-sided numbness. EXAM: CT ANGIOGRAPHY HEAD AND NECK TECHNIQUE: Multidetector CT imaging of the head and neck was performed using the standard protocol during bolus administration of intravenous contrast. Multiplanar CT image reconstructions and MIPs were obtained to evaluate the vascular anatomy. Carotid stenosis measurements (when applicable) are obtained utilizing NASCET criteria, using the distal internal carotid diameter as the denominator. CONTRAST:  100 cc Isovue 370 COMPARISON:  04/12/2017 CT head.  03/20/2017 MRI head. FINDINGS: CTA NECK FINDINGS Aortic arch: Standard branching. Imaged portion shows no evidence of aneurysm or dissection. No significant stenosis of the major arch vessel origins. Right carotid system: No evidence of dissection, stenosis (50% or greater) or occlusion. Left carotid system: No evidence of dissection, stenosis (50% or greater) or occlusion. Vertebral arteries: Codominant. No evidence of dissection, stenosis (50% or greater) or  occlusion. Skeleton: C5-C7 anterior cervical discectomy and fusion. Advanced cervical degenerative changes at the C3-4 level with severe disc space narrowing, a disc protrusion, and anterior marginal osteophytes. Other neck: 13 mm nodule within right lobe of thyroid. Upper chest: Negative. Review of the MIP images confirms the above findings CTA HEAD FINDINGS Anterior circulation: No significant stenosis, proximal occlusion, aneurysm, or vascular malformation. Posterior circulation: Tandem segments of severe stenosis of right vertebral artery. Patent left vertebral artery, basilar artery, and bilateral posterior cerebral arteries. No large vessel occlusion, aneurysm, or significant  stenosis is identified. Venous sinuses: As permitted by contrast timing, patent. Anatomic variants: Anterior communicating artery and probable diminutive bilateral posterior communicating arteries. Delayed phase: No abnormal intracranial enhancement. Review of the MIP images confirms the above findings IMPRESSION: 1. Patent carotid and vertebral arteries in the neck. No dissection, aneurysm, or hemodynamically significant stenosis utilizing NASCET criteria. 2. Patent circle of Willis.  No large vessel occlusion or aneurysm. 3. Tandem segments of severe stenosis of right vertebral artery proximal to PICA origin. Otherwise no significant intracranial arterial stenosis. These results were called by telephone at the time of interpretation on 04/12/2017 at 2:28 am to Dr. Joseph Berkshire , who verbally acknowledged these results. Electronically Signed   By: Kristine Garbe M.D.   On: 04/12/2017 02:28    Mr Brain Wo Contrast   Result Date: 04/12/2017 CLINICAL DATA:  61 year old male with weakness and confusion. Left corona radiata/basal ganglia infarct earlier this month. Severe distal right vertebral artery stenosis on CTA head and neck earlier today. EXAM: MRI HEAD WITHOUT CONTRAST TECHNIQUE: Multiplanar, multiecho pulse sequences of the brain and surrounding structures were obtained without intravenous contrast. COMPARISON:  CTA head and neck 0156 hours today. Brain MRI 03/20/2017 FINDINGS: The examination had to be discontinued prior to completion due to inability of the patient to cooperate. Axial and coronal diffusion weighted imaging, sagittal T1, and axial FLAIR in T2 weighted imaging was obtained. Brain: Continued trace diffusion signal abnormality in the left corona radiata tracking toward the left lentiform nuclei, but signal on the ADC map has normalized since August. Associated T2 and FLAIR hyperintensity with no mass effect or evidence of interval hemorrhage. New linear and patchy area of  restricted diffusion in the right medulla (series 3, image 67 and series 6 image 14). Mild if any associated T2 and FLAIR hyperintensity. No associated hemorrhage or mass effect. No other posterior fossa restricted diffusion. No other restricted diffusion. No midline shift, mass effect, evidence of mass lesion, ventriculomegaly, extra-axial collection or acute intracranial hemorrhage. Cervicomedullary junction and pituitary are within normal limits. Periventricular and other scattered cerebral white matter signal abnormality is stable and in areas resembles chronic lacunar infarcts. Stable chronic lacunar infarcts of both caudate nuclei. Vascular: Major intracranial vascular flow voids are stable. Skull and upper cervical spine: Stable. Sinuses/Orbits: Stable and negative orbits soft tissues. Stable left paranasal sinus mucosal thickening. Other: Stable left greater than right mastoid effusions. Negative scalp soft tissues. IMPRESSION: 1. Small acute right medullary infarct with no associated hemorrhage or mass effect. This is likely associated with the severe distal right vertebral artery stenosis demonstrated today by CTA. 2. Evolution of the left corona radiata/basal ganglia infarct since 03/20/2017 with no associated hemorrhage or mass effect. Underlying chronic cerebral white matter and basal ganglia small vessel ischemia. 3.  The examination had to be discontinued prior to completion. Electronically Signed   By: Genevie Ann M.D.   On: 04/12/2017 08:34    Ct Head Code  Stroke Wo Contrast`   Result Date: 04/12/2017 CLINICAL DATA:  Code stroke. These results were called by telephone at the time of interpretation on 04/12/2017 at 1:05 am to Dr. Joseph Berkshire , who verbally acknowledged these results. Right-sided numbness. EXAM: CT HEAD WITHOUT CONTRAST TECHNIQUE: Contiguous axial images were obtained from the base of the skull through the vertex without intravenous contrast. COMPARISON:  03/20/2017 CT head  FINDINGS: Brain: No evidence of acute infarction, hemorrhage, hydrocephalus, extra-axial collection or mass lesion/mass effect. The chronic lacunar infarcts are present within the bilateral caudate heads, bilateral putamen, and left caudate body/ anterior corona radiata. Additionally, there are small chronic periventricular infarcts within the bilateral frontal lobes. Vascular: The calcific atherosclerosis of carotid siphons. No hyperdense vessel identified. Skull: Normal. Negative for fracture or focal lesion. Sinuses/Orbits: No acute finding. Other: None. ASPECTS [facility] Stroke Program Early CT Score) - Ganglionic level infarction (caudate, lentiform nuclei, internal capsule, insula, M1-M3 cortex): 7 - Supraganglionic infarction (M4-M6 cortex): 3 Total score (0-10 with 10 being normal): 10 IMPRESSION: 1. No acute intracranial abnormality identified. 2. ASPECTS is 10 3. Multiple stable chronic lacunar infarcts in basal ganglia and periventricular white matter. These results were called by telephone at the time of interpretation on 04/12/2017 at 1:08 am to Dr. Joseph Berkshire , who verbally acknowledged these results. Electronically Signed   By: Kristine Garbe M.D.   On: 04/12/2017 01:10             Medical Problem List and Plan: 1.  Right hemiparesis as well as left side weakness/dysarthria secondary to acute lacunar infarction involving the right medulla oblongata and medial aspect. Subacute large vessel left MCA infarct due to intracranial stenosis 2.  DVT Prophylaxis/Anticoagulation: Subcutaneous Lovenox. Monitor platelet counts of any signs of bleeding 3. Pain Management/chronic back pain: Oxycodone as needed 4. Mood: Xanax 0.5 mg 3 times a day as needed 5. Neuropsych: This patient is capable of making decisions on his own behalf. 6. Skin/Wound Care: Routine skin checks 7. Fluids/Electrolytes/Nutrition: Routine I&O's with follow-up chemistries 8. Hyperlipidemia. Lipitor 9. Tobacco  abuse counseling 10. Recent left ureteral stone. Continue Flomax. Follow-up urology services 11. Hypertension. Norvasc 5 mg daily. Monitor with increased mobility         Post Admission Physician Evaluation: 1. Functional deficits secondary  to left hemiparesis due to right medulla oblongata infarct, recent left MCA infarct causing residual right hemiparesis. 2. Patient is admitted to receive collaborative, interdisciplinary care between the physiatrist, rehab nursing staff, and therapy team. 3. Patient's level of medical complexity and substantial therapy needs in context of that medical necessity cannot be provided at a lesser intensity of care such as a SNF. 4. Patient has experienced substantial functional loss from his/her baseline which was documented above under the "Functional History" and "Functional Status" headings.  Judging by the patient's diagnosis, physical exam, and functional history, the patient has potential for functional progress which will result in measurable gains while on inpatient rehab.  These gains will be of substantial and practical use upon discharge  in facilitating mobility and self-care at the household level. 5. Physiatrist will provide 24 hour management of medical needs as well as oversight of the therapy plan/treatment and provide guidance as appropriate regarding the interaction of the two. 6. The Preadmission Screening has been reviewed and patient status is unchanged unless otherwise stated above. 7. 24 hour rehab nursing will assist with bladder management, bowel management, safety, skin/wound care, disease management, medication administration, pain management and patient education  and help integrate therapy concepts, techniques,education, etc. 8. PT will assess and treat for/with: pre gait, gait training, endurance , safety, equipment, neuromuscular re education.   Goals are: Supervision to modified independent. 9. OT will assess and treat for/with: ADLs,  Cognitive perceptual skills, Neuromuscular re education, safety, endurance, equipment.   Goals are: Sup/Mod I. Therapy may proceed with showering this patient. 10. SLP will assess and treat for/with: Swallowing, speech articulation, evaluate cognition.  Goals are: Safe by mouth intake, 100% speech intelligibility. 11. Case Management and Social Worker will assess and treat for psychological issues and discharge planning. 12. Team conference will be held weekly to assess progress toward goals and to determine barriers to discharge. 13. Patient will receive at least 3 hours of therapy per day at least 5 days per week. 14. ELOS: 18-21 days       15. Prognosis:  excellent         Charlett Blake M.D. [facility] FAAPM&R (Sports Med, Neuromuscular Med) Diplomate Am Board of Electrodiagnostic Med  Cathlyn Parsons., PA-C 04/13/2017

## 2017-04-14 ENCOUNTER — Inpatient Hospital Stay (HOSPITAL_COMMUNITY): Payer: Medicare Other | Admitting: Speech Pathology

## 2017-04-14 ENCOUNTER — Inpatient Hospital Stay (HOSPITAL_COMMUNITY): Payer: Medicare Other

## 2017-04-14 ENCOUNTER — Inpatient Hospital Stay (HOSPITAL_COMMUNITY): Payer: Medicare Other | Admitting: Occupational Therapy

## 2017-04-14 DIAGNOSIS — G8191 Hemiplegia, unspecified affecting right dominant side: Secondary | ICD-10-CM

## 2017-04-14 DIAGNOSIS — I63512 Cerebral infarction due to unspecified occlusion or stenosis of left middle cerebral artery: Secondary | ICD-10-CM

## 2017-04-14 LAB — CBC WITH DIFFERENTIAL/PLATELET
BASOS PCT: 0 %
Basophils Absolute: 0 10*3/uL (ref 0.0–0.1)
Eosinophils Absolute: 0.1 10*3/uL (ref 0.0–0.7)
Eosinophils Relative: 1 %
HEMATOCRIT: 44.4 % (ref 39.0–52.0)
HEMOGLOBIN: 14.8 g/dL (ref 13.0–17.0)
LYMPHS PCT: 21 %
Lymphs Abs: 2.3 10*3/uL (ref 0.7–4.0)
MCH: 30 pg (ref 26.0–34.0)
MCHC: 33.3 g/dL (ref 30.0–36.0)
MCV: 90.1 fL (ref 78.0–100.0)
MONOS PCT: 9 %
Monocytes Absolute: 0.9 10*3/uL (ref 0.1–1.0)
NEUTROS ABS: 7.3 10*3/uL (ref 1.7–7.7)
NEUTROS PCT: 69 %
Platelets: 376 10*3/uL (ref 150–400)
RBC: 4.93 MIL/uL (ref 4.22–5.81)
RDW: 14 % (ref 11.5–15.5)
WBC: 10.6 10*3/uL — ABNORMAL HIGH (ref 4.0–10.5)

## 2017-04-14 LAB — COMPREHENSIVE METABOLIC PANEL
ALBUMIN: 4.1 g/dL (ref 3.5–5.0)
ALK PHOS: 108 U/L (ref 38–126)
ALT: 46 U/L (ref 17–63)
ANION GAP: 10 (ref 5–15)
AST: 38 U/L (ref 15–41)
BILIRUBIN TOTAL: 0.8 mg/dL (ref 0.3–1.2)
BUN: 11 mg/dL (ref 6–20)
CALCIUM: 9.5 mg/dL (ref 8.9–10.3)
CO2: 24 mmol/L (ref 22–32)
Chloride: 109 mmol/L (ref 101–111)
Creatinine, Ser: 0.85 mg/dL (ref 0.61–1.24)
GFR calc Af Amer: >60 mL/min (ref >60)
GFR calc non Af Amer: >60 mL/min (ref >60)
GLUCOSE: 103 mg/dL — AB (ref 65–99)
Potassium: 3.6 mmol/L (ref 3.5–5.1)
Sodium: 143 mmol/L (ref 135–145)
TOTAL PROTEIN: 6.9 g/dL (ref 6.5–8.1)

## 2017-04-14 NOTE — Care Management Note (Signed)
[facility]patient [facility] Individual Statement of Services  Patient Name:  Brendan GreenhouseDouglas B Hable  Date:  04/14/2017  Welcome to the [facility]patient Rehabilitation [facility].  Our goal is to provide you with an individualized program based on your diagnosis and situation, designed to meet your specific needs.  With this comprehensive rehabilitation program, you will be expected to participate in at least 3 hours of rehabilitation therapies Monday-Friday, with modified therapy programming on the weekends.  Your rehabilitation program will include the following services:  Physical Therapy (PT), Occupational Therapy (OT), Speech Therapy (ST), 24 hour per day rehabilitation nursing, Neuropsychology, Case Management (Social Worker), Rehabilitation Medicine, Nutrition Services and Pharmacy Services  Weekly team conferences will be held on Wednesday to discuss your progress.  Your Social Worker will talk with you frequently to get your input and to update you on team discussions.  Team conferences with you and your family in attendance may also be held.  Expected length of stay: 17-21 days  Overall anticipated outcome: supervision-min assist level  Depending on your progress and recovery, your program may change. Your Social Worker will coordinate services and will keep you informed of any changes. Your Social Worker's name and contact numbers are listed  below.  The following services may also be recommended but are not provided by the Inpatient Rehabilitation Center:   Driving Evaluations  Home Health Rehabiltiation Services  Outpatient Rehabilitation Services    Arrangements will be made to provide these services after discharge if needed.  Arrangements include referral to agencies that provide these services.  Your insurance has been verified to be:  [employer]-Medicare Your primary doctor is:  Elfredia NevinsLawrence Fusco  Pertinent information will be shared with your doctor and your insurance company.  Social  Worker:  Dossie DerBecky Nicolette Gieske, SW [phone] or [phone]) [phone]  Information discussed with and copy given to patient by: Lucy Chrisupree, Mikalia Fessel G, 04/14/2017, 12:53 PM

## 2017-04-14 NOTE — Progress Notes (Signed)
Patient information reviewed and entered into eRehab system by Johnnie Goynes, RN, CRRN, PPS Coordinator.  Information including medical coding and functional independence measure will be reviewed and updated through discharge.     Per nursing patient was given "Data Collection Information Summary for Patients in Inpatient Rehabilitation Facilities with attached "Privacy Act Statement-Health Care Records" upon admission.  

## 2017-04-14 NOTE — IPOC Note (Signed)
Overall Plan of Care [facility]) Patient Details Name: Brendan Charles MRN: [id number] DOB: [birth year]  Admitting Diagnosis: cva  Hospital Problems: Active Problems:   Left middle cerebral artery stroke [facility])     Functional Problem List: Nursing Bladder, Bowel, Safety, Sensory  PT Balance, Endurance, Motor, Pain, Safety, Skin Integrity  OT Balance, Cognition, Endurance, Motor, Perception, Sensory  SLP    TR         Basic ADL's: OT Eating, Grooming, Dressing, Toileting, Bathing     Advanced  ADL's: OT       Transfers: PT Bed Mobility, Bed to Chair, Car, Occupational psychologist, Research scientist (life sciences): PT Ambulation, Psychologist, prison and probation services, Stairs     Additional Impairments: OT Fuctional Use of Upper Extremity  SLP Swallowing, Communication expression    TR      Anticipated Outcomes Item Anticipated Outcome  Self Feeding Supervision   Swallowing  Mod I   Basic self-care  Supervision  Toileting  Supervision   Bathroom Transfers Supervision  Bowel/Bladder  to remain continent of b/b with min assistance  Transfers  supervision  Locomotion  supervision household gait distances; min assist stairs  Communication  Supervision   Cognition     Pain  less than 3   Safety/Judgment  pt will remain free from falls with min assistance   Therapy Plan: PT Intensity: Minimum of 1-2 x/day ,45 to 90 minutes PT Frequency: 5 out of 7 days PT Duration Estimated Length of Stay: 17-21 days OT Intensity: Minimum of 1-2 x/day, 45 to 90 minutes OT Frequency: 5 out of 7 days OT Duration/Estimated Length of Stay: 18-21 days SLP Intensity: Minumum of 1-2 x/day, 30 to 90 minutes SLP Frequency: 3 to 5 out of 7 days SLP Duration/Estimated Length of Stay: 17-21 days    Team Interventions: Nursing Interventions Patient/Family Education, Bladder Management, Bowel Management, Disease Management/Prevention, Pain Management, Dysphagia/Aspiration Precaution Training, Discharge Planning   PT interventions Ambulation/gait training, Balance/vestibular training, Cognitive remediation/compensation, Discharge planning, Community reintegration, Disease management/prevention, DME/adaptive equipment instruction, Functional electrical stimulation, Functional mobility training, Neuromuscular re-education, Pain management, Patient/family education, Psychosocial support, Skin care/wound management, Splinting/orthotics, Stair training, Therapeutic Exercise, Therapeutic Activities, UE/LE Strength taining/ROM, UE/LE Coordination activities, Wheelchair propulsion/positioning  OT Interventions Warden/ranger, Cognitive remediation/compensation, Community reintegration, Discharge planning, DME/adaptive equipment instruction, Functional electrical stimulation, Functional mobility training, Neuromuscular re-education, Patient/family education, Psychosocial support, Self Care/advanced ADL retraining, Therapeutic Activities, Therapeutic Exercise, UE/LE Strength taining/ROM, Splinting/orthotics, UE/LE Coordination activities, Wheelchair propulsion/positioning, Visual/perceptual remediation/compensation  SLP Interventions Cueing hierarchy, Dysphagia/aspiration precaution training, Environmental controls, Functional tasks, Patient/family education, Therapeutic Activities, Speech/Language facilitation, Internal/external aids  TR Interventions    SW/CM Interventions Discharge Planning, Psychosocial Support, Patient/Family Education   Barriers to Discharge MD  Medical stability  Nursing Home environment access/layout    PT Decreased caregiver support reports son can provide 24/7 but need to confirm  OT      SLP      SW       Team Discharge Planning: Destination: PT-Home ,OT- Home , SLP-Home Projected Follow-up: PT-Home health PT, 24 hour supervision/assistance, OT-  Home health OT, SLP-Outpatient SLP, 24 hour supervision/assistance Projected Equipment Needs: PT-To be determined, OT- To be  determined (possible tub transfer bench), SLP-To be determined Equipment Details: PT-has RW; TBD if w/c needed, OT-  Patient/family involved in discharge planning: PT- Patient,  OT-Patient, Family member/caregiver, SLP-Patient  MD ELOS: 17-21 days Medical Rehab Prognosis:  Excellent Assessment: The patient has been admitted for CIR therapies with the diagnosis  of left mca infarct. The team will be addressing functional mobility, strength, stamina, balance, safety, adaptive techniques and equipment, self-care, bowel and bladder mgt, patient and caregiver education, NMR, orthotic, community reentry, swallowing, cognition. Goals have been set at supervision across all disciplines .    Ranelle OysterZachary T. Bo Rogue, MD, FAAPMR      See Team Conference Notes for weekly updates to the plan of care

## 2017-04-14 NOTE — Evaluation (Signed)
Physical Therapy Assessment and Plan  Patient Details  Name: HODGES TREIBER MRN: [id number] Date of Birth: Dec 06, 1955  PT Diagnosis: Abnormal posture, Difficulty walking, Hemiparesis non-dominant, Hypotonia, Impaired cognition, Muscle weakness, Pain in joint and Paralysis Rehab Potential: Good ELOS: 17-21 days   Today's Date: 04/14/2017 PT Individual Time: 1000-1100 PT Individual Time Calculation (min): 60 min    Problem List:  Patient Active Problem List   Diagnosis Date Noted  . Left middle cerebral artery stroke ([place]) 04/13/2017  . Weakness   . Acute ischemic stroke ([place]) 04/12/2017  . Stroke (cerebrum) ([place]) 04/12/2017  . Acute CVA (cerebrovascular accident) ([place]) 04/12/2017  . CVA (cerebral vascular accident) ([place]) 03/20/2017  . Hydronephrosis, left 03/20/2017  . Aortic atherosclerosis ([place]) 03/20/2017  . Hepatitis C 02/14/2017  . Encounter for screening colonoscopy 02/14/2017  . Spondylolisthesis at L3-L4 level 06/03/2014    Past Medical History:  Past Medical History:  Diagnosis Date  . Anxiety   . Chronic back pain   . CVA (cerebral vascular accident) ([place]) 03/20/2017  . Hepatitis C    HEP C, never treated, 2007   Past Surgical History:  Past Surgical History:  Procedure Laterality Date  . CERVICAL DISC SURGERY  2001   anterior  . COLONOSCOPY WITH PROPOFOL N/A 03/14/2017   Procedure: COLONOSCOPY WITH PROPOFOL;  Surgeon: Danie Binder, MD;  [facility];  Service: Endoscopy;  Laterality: N/A;  8:15am  . CYSTOSCOPY W/ URETERAL STENT PLACEMENT Left 03/23/2017   Procedure: CYSTOSCOPY WITH LEFT RETROGRADE PYELOGRAM/URETERAL LEFT STENT PLACEMENT;  Surgeon: Cleon Gustin, MD;  Location: [facility];  Service: Urology;  Laterality: Left;  . LUMBAR SPINE SURGERY  2015  . POLYPECTOMY  03/14/2017   Procedure: POLYPECTOMY;  Surgeon: Danie Binder, MD;  [facility];  Service: Endoscopy;;  rectum    Assessment & Plan Clinical Impression: Patient  is a 61 y.o. year old right-handed male with history of tobacco abuse, hepatitis C and recent CVA with right hemiparesis due to left MCA infarct admitted 03/20/2017 discharge 03/21/2017 ambulating minimal assist to 100 feet using a rolling walker. Discharged on aspirin and Plavix. Per chart review patient lives with family. Was using a walker prior to admission. 2 level home with bath and bedroom on main level and 5 steps to entry of home. Family can assist as needed. He was able to complete ADL tasks with supervision for safety. He was receiving home therapies with advanced home care. Patient was doing well until 04/12/2017 with left-sided weakness and numbnessas well as severe dysarthria. CT/MRI showed small acute right medullary infarct no associated hemorrhage or mass effect. Evolution of left corona radiata basal ganglia infarct since 03/20/2017. CT angiogram head and neck with no dissection or aneurysm. No large vessel occlusion. Recent echocardiogram with ejection fraction of 65% overall motion abnormality. Patient also with recent left ureteral stone seen by urology Dr. Haskel Schroeder underwent a cystoscopy 03/23/2017 showing moderate hydronephrosis no masses or lesions. Plan was for stone extraction in approximately 4 weeks. He had been placed on Flomax. Patient currently remains on aspirin and Plavix as prior to admission. Subcutaneous Lovenox for DVT prophylaxis. Mechanical soft thin liquid diet. Physical therapy evaluation completed 04/12/2017 with recommendations of physical medicine rehabilitation consult. Patient was admitted for comprehensive rehabilitation program. Patient transferred to CIR on 04/13/2017 .   Patient currently requires mod to max assist with mobility secondary to muscle weakness, muscle joint tightness and muscle paralysis, decreased cardiorespiratoy endurance, impaired timing and sequencing  and abnormal tone, decreased problem solving, decreased safety awareness and decreased  memory and decreased sitting balance, decreased standing balance, decreased postural control and decreased balance strategies.  Prior to hospitalization, patient was supervision with mobility and lived with Son in a House home.  Home access is 5 in front rails to wide to use both, 17 step to 2nd floor in house with siderail on the leftStairs to enter.  Patient will benefit from skilled PT intervention to maximize safe functional mobility, minimize fall risk and decrease caregiver burden for planned discharge home with 24 hour supervision.  Anticipate patient will benefit from follow up [place] at discharge.  PT - End of Session Activity Tolerance: Decreased this session PT Assessment Rehab Potential (ACUTE/IP ONLY): Good PT Barriers to Discharge: Decreased caregiver support PT Barriers to Discharge Comments: reports son can provide 24/7 but need to confirm PT Patient demonstrates impairments in the following area(s): Balance;Endurance;Motor;Pain;Safety;Skin Integrity PT Transfers Functional Problem(s): Bed Mobility;Bed to Chair;Car;Furniture PT Locomotion Functional Problem(s): Ambulation;Wheelchair Mobility;Stairs PT Plan PT Intensity: Minimum of 1-2 x/day ,45 to 90 minutes PT Frequency: 5 out of 7 days PT Duration Estimated Length of Stay: 17-21 days PT Treatment/Interventions: Ambulation/gait training;Balance/vestibular training;Cognitive remediation/compensation;Discharge planning;Community reintegration;Disease management/prevention;DME/adaptive equipment instruction;Functional electrical stimulation;Functional mobility training;Neuromuscular re-education;Pain management;Patient/family education;Psychosocial support;Skin care/wound management;Splinting/orthotics;Stair training;Therapeutic Exercise;Therapeutic Activities;UE/LE Strength taining/ROM;UE/LE Coordination activities;Wheelchair propulsion/positioning PT Transfers Anticipated Outcome(s): supervision PT Locomotion Anticipated Outcome(s):  supervision household gait distances; min assist stairs PT Recommendation Recommendations for Other Services: Neuropsych consult Follow Up Recommendations: Home health PT;24 hour supervision/assistance Patient destination: Home Equipment Recommended: To be determined Equipment Details: has RW; TBD if w/c needed  Skilled Therapeutic Intervention Evaluation completed (see details above and below) with education on PT POC and goals and individual treatment initiated with focus on functional transfers and bed mobility, pre-gait and initiation of gait in parallel bars, postural control re-training in seated and standing positions with facilitation for weightshifting and reorientation to midline (both in seated and standing position), w/c mobility using BUE for functional strengthening and NMR for coordination, and initiation of stair negotiation (3" step) to prepare for home entry requiring max assist due to balance and poor postural control and weakness in BLE. Pt becoming tearful during session due to realization of his deficits demonstrating some insight and awareness. Recommending Neuropsych for coping. Pt is motivated to work hard in therapy and get better. Pt does demonstrate some impulsive behaviors during session but improved with education.    PT Evaluation Precautions/Restrictions Precautions Precautions: Fall Precaution Comments: new L hemiparesis; recent CVA with R sided weakness Restrictions Weight Bearing Restrictions: No Pain   generalized discomfort from being in bed and positioning of LUE - repositioned as needed.  Home Living/Prior Functioning Home Living Available Help at Discharge: Family;Available 24 hours/day Type of Home: House Home Access: Stairs to enter [employer] of Steps: 5 in front rails to wide to use both, 17 step to 2nd floor in house with siderail on the left Entrance Stairs-Rails: Right;Left Home Layout: Two level;Able to live on main level with  bedroom/bathroom;Bed/bath upstairs Alternate Level Stairs-Number of Steps: full flight   Lives With: Son Prior Function Level of Independence: Requires assistive device for independence (using RW after recent CVA at supervision level)  Able to Take Stairs?: Yes Driving: No  Cognition Orientation Level: Oriented X4 Safety/Judgment: Impaired Comments: slightly impulsive initially with mobility but improved during session with education Sensation Sensation Light Touch: Appears Intact Coordination Gross Motor Movements are Fluid and Coordinated: No Fine Motor Movements are  Fluid and Coordinated: No Coordination and Movement Description: decreased more in LUE/LLE due to increased weakness compared to R though R side still weak from recent CVA Motor  Motor Motor: Hemiplegia;Abnormal tone;Abnormal postural alignment and control Motor - Skilled Clinical Observations: R sided weakness from recent CVA; new onset L hemiparesis;      Trunk/Postural Assessment  Cervical Assessment Cervical Assessment: Within Functional Limits Thoracic Assessment Thoracic Assessment:  (kyphotic; decreased postural control) Lumbar Assessment Lumbar Assessment:  (posterior pelvic tilt in seated postiion) Postural Control Postural Control: Deficits on evaluation Trunk Control: impaired; up to mod/max assist for balance in seated postiion; posterior bias in standing Protective Responses: delayed and inadequate  Balance Balance Balance Assessed: Yes Static Sitting Balance Static Sitting - Level of Assistance: 5: Stand by assistance;4: Min assist;2: Max assist;3: Mod assist (fluctuates) Dynamic Sitting Balance Dynamic Sitting - Level of Assistance: 3: Mod assist;2: Max assist (EOB without back suppor) Static Standing Balance Static Standing - Level of Assistance: 3: Mod assist Dynamic Standing Balance Dynamic Standing - Level of Assistance: 2: Max assist Extremity Assessment      RLE Assessment RLE  Assessment: Exceptions to [facility] RLE Strength RLE Overall Strength Comments: grossly 3+ to 4-/5 LLE Assessment LLE Assessment: Exceptions to [facility] LLE Strength LLE Overall Strength Comments: able to activate hip and knee against gravity and maintain weightbearing in standing; ankle 2-/5 DF and 3+/5 PF   See Function Navigator for Current Functional Status.   Refer to Care Plan for Long Term Goals  Recommendations for other services: Neuropsych and Therapeutic Recreation  Stress management and Outing/community reintegration  Discharge Criteria: Patient will be discharged from PT if patient refuses treatment 3 consecutive times without medical reason, if treatment goals not met, if there is a change in medical status, if patient makes no progress towards goals or if patient is discharged from hospital.  The above assessment, treatment plan, treatment alternatives and goals were discussed and mutually agreed upon: by patient  Juanna Cao, PT, DPT  04/14/2017, 12:29 PM

## 2017-04-14 NOTE — Evaluation (Signed)
Occupational Therapy Assessment and Plan  Patient Details  Name: Brendan Charles MRN: [id number] Date of Birth: 05-28-1956  OT Diagnosis: apraxia, ataxia, cognitive deficits, hemiplegia affecting non-dominant side and muscle weakness (generalized) Rehab Potential: Rehab Potential (ACUTE ONLY): Excellent ELOS: 18-21 days   Today's Date: 04/14/2017 OT Individual Time: 0272-5366 OT Individual Time Calculation (min): 89 min     Problem List:  Patient Active Problem List   Diagnosis Date Noted  . Left middle cerebral artery stroke ([place]) 04/13/2017  . Weakness   . Acute ischemic stroke ([place]) 04/12/2017  . Stroke (cerebrum) ([place]) 04/12/2017  . Acute CVA (cerebrovascular accident) ([place]) 04/12/2017  . CVA (cerebral vascular accident) ([place]) 03/20/2017  . Hydronephrosis, left 03/20/2017  . Aortic atherosclerosis ([place]) 03/20/2017  . Hepatitis C 02/14/2017  . Encounter for screening colonoscopy 02/14/2017  . Spondylolisthesis at L3-L4 level 06/03/2014    Past Medical History:  Past Medical History:  Diagnosis Date  . Anxiety   . Chronic back pain   . CVA (cerebral vascular accident) ([place]) 03/20/2017  . Hepatitis C    HEP C, never treated, 2007   Past Surgical History:  Past Surgical History:  Procedure Laterality Date  . CERVICAL DISC SURGERY  2001   anterior  . COLONOSCOPY WITH PROPOFOL N/A 03/14/2017   Procedure: COLONOSCOPY WITH PROPOFOL;  Surgeon: Danie Binder, MD;  [facility];  Service: Endoscopy;  Laterality: N/A;  8:15am  . CYSTOSCOPY W/ URETERAL STENT PLACEMENT Left 03/23/2017   Procedure: CYSTOSCOPY WITH LEFT RETROGRADE PYELOGRAM/URETERAL LEFT STENT PLACEMENT;  Surgeon: Cleon Gustin, MD;  [facility];  Service: Urology;  Laterality: Left;  . LUMBAR SPINE SURGERY  2015  . POLYPECTOMY  03/14/2017   Procedure: POLYPECTOMY;  Surgeon: Danie Binder, MD;  [facility];  Service: Endoscopy;;  rectum    Assessment & Plan Clinical Impression:  Patient is a 61 y.o. year old male with history of tobacco abuse, hepatitis C and recent CVA with right hemiparesis due to left MCA infarct admitted 03/20/2017 discharge 03/21/2017 ambulating minimal assist to 100 feet using a rolling walker. Discharged on aspirin and Plavix. Per chart review patient lives with family. Was using a walker prior to admission. 2 level home with bath and bedroom on main level and 5 steps to entry of home. Family can assist as needed. He was able to complete ADL tasks with supervision for safety. He was receiving home therapies with advanced home care. Patient was doing well until 04/12/2017 with left-sided weakness and numbness as well as severe dysarthria. CT/MRI showed small acute right medullary infarct no associated hemorrhage or mass effect. Evolution of left corona radiata basal ganglia infarct since 03/20/2017. CT angiogram head and neck with no dissection or aneurysm. No large vessel occlusion. Recent echocardiogram with ejection fraction of 65% overall motion abnormality. Patient also with recent left ureteral stone seen by urology Dr. Haskel Schroeder underwent a cystoscopy 03/23/2017 showing moderate hydronephrosis no masses or lesions. Patient transferred to CIR on 04/13/2017 .    Patient currently requires max with basic self-care skills secondary to muscle weakness, decreased cardiorespiratoy endurance, impaired timing and sequencing, abnormal tone, unbalanced muscle activation, motor apraxia, ataxia, decreased coordination and decreased motor planning, decreased midline orientation and decreased attention to left, decreased initiation, decreased attention, decreased awareness and decreased problem solving and decreased sitting balance, decreased standing balance, decreased postural control, hemiplegia and decreased balance strategies.  Prior to hospitalization, patient could complete BADL with supervision for safety, needed  assistance for higher level IADL.  Patient  will benefit from skilled intervention to decrease level of assist with basic self-care skills and increase independence with basic self-care skills prior to discharge home with care partner.  Anticipate patient will require 24 hour supervision and follow up home health.  OT - End of Session Endurance Deficit: Yes Endurance Deficit Description: Multiple rest breaks required during BADL OT Assessment Rehab Potential (ACUTE ONLY): Excellent OT Patient demonstrates impairments in the following area(s): Balance;Cognition;Endurance;Motor;Perception;Sensory OT Basic ADL's Functional Problem(s): Eating;Grooming;Dressing;Toileting;Bathing OT Transfers Functional Problem(s): Toilet;Tub/Shower OT Additional Impairment(s): Fuctional Use of Upper Extremity OT Plan OT Intensity: Minimum of 1-2 x/day, 45 to 90 minutes OT Frequency: 5 out of 7 days OT Duration/Estimated Length of Stay: 18-21 days OT Treatment/Interventions: Balance/vestibular training;Cognitive remediation/compensation;Community reintegration;Discharge planning;DME/adaptive equipment instruction;Functional electrical stimulation;Functional mobility training;Neuromuscular re-education;Patient/family education;Psychosocial support;Self Care/advanced ADL retraining;Therapeutic Activities;Therapeutic Exercise;UE/LE Strength taining/ROM;Splinting/orthotics;UE/LE Coordination activities;Wheelchair propulsion/positioning;Visual/perceptual remediation/compensation OT Self Feeding Anticipated Outcome(s): Supervision  OT Basic Self-Care Anticipated Outcome(s): Supervision OT Toileting Anticipated Outcome(s): Supervision OT Bathroom Transfers Anticipated Outcome(s): Supervision OT Recommendation Recommendations for Other Services: Neuropsych consult;Therapeutic Recreation consult Therapeutic Recreation Interventions: Outing/community reintergration;Stress management Patient destination: Home Follow Up Recommendations: Home health OT Equipment  Recommended: To be determined (possible tub transfer bench) Skilled Therapeutic Intervention OT eval completed addressing rehab process, OT purpose, POC, and goals. Treatment session provided focused on modified bathing/dressing, functional use of L UE, activity tolerance, sitting balance, and improved sit<>stand. Pt completed squat-pivot transfers with Mod A to stronger R side and Max A to left side. Pt completed bathing shower level with close supevrision/min A for sitting balance on tub bench and Mod A overrall for bathing tasks. Guided  A to integrate L UE into bathing tasks. Pt needed elbow support to bring L UE all the way to scalp to assist with hair washing 2/2 L UE weakness and ataxia. Dressing completed wc level at the sink with Max A for LB and Mod A for UB. Pt needed multiple rest breaks and fatigued quickly. Worked on improved sit<>stand, postural control and standing balance with grooming tasks at the sink. Pt needed Max A to power up to stand, then utilized hip press on sink to maintain standing balance with min/mod while R UE brushed hair. Pt brought to therapy gym and worked on L fine motor control with graded peg board activity. Pt returned to bed at end of session with Max A squat-pivot to L. Pt left semi-reclined in bed with needs met and son present.    OT Evaluation Precautions/Restrictions  Precautions Precautions: Fall Precaution Comments: new L hemiparesis; recent CVA with R sided weakness Restrictions Weight Bearing Restrictions: No Pain  none/denies pain Home Living/Prior Functioning Home Living Family/patient expects to be discharged to:: Private residence Living Arrangements: Children Available Help at Discharge: Family, Available 24 hours/day Type of Home: House Home Access: Stairs to enter [employer] of Steps: 5 in front rails to wide to use both, 17 step to 2nd floor in house with siderail on the left Entrance Stairs-Rails: Right, Left Home Layout:  Two level, Able to live on main level with bedroom/bathroom, Bed/bath upstairs Alternate Level Stairs-Number of Steps: full flight  Alternate Level Stairs-Rails: Left Bathroom Shower/Tub: Chiropodist: Standard Bathroom Accessibility: Yes Additional Comments: Has a walk-in shower upstairs, but downstairs is only tub shower  Lives With: Son IADL History Education: 2 years college Occupation: Retired Type of Occupation: retired Administrator Leisure and [place]: enjoys watching TV Prior Function Level of Independence: Requires  assistive device for independence, Independent with basic ADLs (using RW after recent CVA at supervision level)  Able to Take Stairs?: Yes Driving: No Vocation: Retired (Administrator) Comments: Pt receiving PT/OT/RN services from [facility], reports this Friday was supposed to be his last day with them ADL ADL ADL Comments: Please see functional navigator Vision Baseline Vision/History: No visual deficits Patient Visual Report: No change from baseline Vision Assessment?: No apparent visual deficits Perception  Perception: Within Functional Limits Praxis Praxis: Impaired Praxis Impairment Details: Motor planning Cognition Overall Cognitive Status: Impaired/Different from baseline Arousal/Alertness: Awake/alert Orientation Level: Person;Place;Situation Person: Oriented Place: Oriented Situation: Oriented Year: 2018 Month: September Day of Week: Correct Memory: Appears intact Immediate Memory Recall: Sock;Blue;Bed Memory Recall: Blue;Bed Memory Recall Blue: Without Cue Memory Recall Bed: Without Cue Awareness: Appears intact Problem Solving: Appears intact Behaviors: Impulsive Safety/Judgment: Impaired Comments: slightly impulsive initially with mobility but improved during session with education Sensation Sensation Light Touch: Impaired Detail Light Touch Impaired Details: Impaired LUE;Impaired LLE Hot/Cold: Appears  Intact Coordination Gross Motor Movements are Fluid and Coordinated: No Fine Motor Movements are Fluid and Coordinated: No Coordination and Movement Description: decreased more in LUE/LLE due to increased weakness compared to R though R side still weak from recent CVA Finger Nose Finger Test: undershooting with L UE Motor  Motor Motor: Hemiplegia;Abnormal tone;Abnormal postural alignment and control Motor - Skilled Clinical Observations: R sided weakness from recent CVA; new onset L hemiparesis;  Trunk/Postural Assessment  Cervical Assessment Cervical Assessment: Within Functional Limits Thoracic Assessment Thoracic Assessment:  (kyphotic; decreased postural control) Lumbar Assessment Lumbar Assessment:  (posterior pelvic tilt in seated postiion) Postural Control Postural Control: Deficits on evaluation Trunk Control: impaired; up to mod/max assist for balance in seated postiion; posterior bias in standing Protective Responses: delayed and inadequate  Balance Balance Balance Assessed: Yes Static Sitting Balance Static Sitting - Level of Assistance: 5: Stand by assistance;4: Min assist;2: Max assist;3: Mod assist (fluctuates) Dynamic Sitting Balance Dynamic Sitting - Level of Assistance: 3: Mod assist;2: Max assist (EOB without back suppor) Static Standing Balance Static Standing - Level of Assistance: 3: Mod assist Dynamic Standing Balance Dynamic Standing - Level of Assistance: 2: Max assist Extremity/Trunk Assessment RUE Assessment RUE Assessment: Within Functional Limits (4-/5 overall) LUE Assessment LUE Assessment: Exceptions to [facility] (2-/5 overall, proximal weakness) LUE Strength Left Shoulder Flexion: 2-/5   See Function Navigator for Current Functional Status.   Refer to Care Plan for Long Term Goals  Recommendations for other services: Neuropsych and Therapeutic Recreation  Stress management and Outing/community reintegration   Discharge Criteria: Patient will be  discharged from OT if patient refuses treatment 3 consecutive times without medical reason, if treatment goals not met, if there is a change in medical status, if patient makes no progress towards goals or if patient is discharged from hospital.  The above assessment, treatment plan, treatment alternatives and goals were discussed and mutually agreed upon: by patient and by family  [name] 04/14/2017, 4:22 PM

## 2017-04-14 NOTE — Evaluation (Signed)
Speech Language Pathology Assessment and Plan  Patient Details  Name: Brendan Charles MRN: [id number] Date of Birth: 1956-06-21  SLP Diagnosis: Dysarthria;Dysphagia  Rehab Potential: Fair ELOS: 17-21 days    Today's Date: 04/14/2017 SLP Individual Time: 0712-1975 SLP Individual Time Calculation (min): 25 min   Problem List:  Patient Active Problem List   Diagnosis Date Noted  . Left middle cerebral artery stroke ([place]) 04/13/2017  . Weakness   . Acute ischemic stroke ([place]) 04/12/2017  . Stroke (cerebrum) ([place]) 04/12/2017  . Acute CVA (cerebrovascular accident) ([place]) 04/12/2017  . CVA (cerebral vascular accident) ([place]) 03/20/2017  . Hydronephrosis, left 03/20/2017  . Aortic atherosclerosis ([place]) 03/20/2017  . Hepatitis C 02/14/2017  . Encounter for screening colonoscopy 02/14/2017  . Spondylolisthesis at L3-L4 level 06/03/2014   Past Medical History:  Past Medical History:  Diagnosis Date  . Anxiety   . Chronic back pain   . CVA (cerebral vascular accident) ([place]) 03/20/2017  . Hepatitis C    HEP C, never treated, 2007   Past Surgical History:  Past Surgical History:  Procedure Laterality Date  . CERVICAL DISC SURGERY  2001   anterior  . COLONOSCOPY WITH PROPOFOL N/A 03/14/2017   Procedure: COLONOSCOPY WITH PROPOFOL;  Surgeon: Danie Binder, MD;  [facility];  Service: Endoscopy;  Laterality: N/A;  8:15am  . CYSTOSCOPY W/ URETERAL STENT PLACEMENT Left 03/23/2017   Procedure: CYSTOSCOPY WITH LEFT RETROGRADE PYELOGRAM/URETERAL LEFT STENT PLACEMENT;  Surgeon: Cleon Gustin, MD;  Location: [facility];  Service: Urology;  Laterality: Left;  . LUMBAR SPINE SURGERY  2015  . POLYPECTOMY  03/14/2017   Procedure: POLYPECTOMY;  Surgeon: Danie Binder, MD;  [facility];  Service: Endoscopy;;  rectum    Assessment / Plan / Recommendation Clinical Impression Patient is a 61 year old right-handed male with history of tobacco abuse, hepatitis C and recent  CVA with right hemiparesis due to left MCA infarct admitted 03/20/2017 discharge 03/21/2017 ambulating minimal assist to 100 feet using a rolling walker. Discharged on aspirin and Plavix. Per chart review patient lives with family. Was using a walker prior to admission. 2 level home with bath and bedroom on main level and 5 steps to entry of home. Family can assist as needed. He was able to complete ADL tasks with supervision for safety. He was receiving home therapies with advanced home care. Patient was doing well until 04/12/2017 with left-sided weakness and numbnessas well as severe dysarthria. CT/MRI showed small acute right medullary infarct no associated hemorrhage or mass effect. Evolution of left corona radiata basal ganglia infarct since 03/20/2017. CT angiogram head and neck with no dissection or aneurysm. No large vessel occlusion. Recent echocardiogram with ejection fraction of 65% overall motion abnormality. Patient also with recent left ureteral stone seen by urology Dr. Haskel Schroeder underwent a cystoscopy 03/23/2017 showing moderate hydronephrosis no masses or lesions. Plan was for stone extraction in approximately 4 weeks. He had been placed on Flomax. Patient currently remains on aspirin and Plavix as prior to admission. Subcutaneous Lovenox for DVT prophylaxis. Mechanical soft thin liquid diet. Physical therapy evaluation completed 04/12/2017 with recommendations of physical medicine rehabilitation consult. Patient was admitted for comprehensive rehabilitation program 04/13/17.  Patient demonstrates a moderate-severe dysarthria due to poor respiratory support and decreased lingual strength and ROM leading to ~50% intelligibility at the phrase level.  Patient consumed thin liquids via straw without overt s/s of aspiration but demonstrated prolonged mastication of solid textures. Recommend patient continue thin liquids  but downgrade to Dys. 2 textures. Although patient appears to be tolerating  his diet indicated by clear lung sounds and remaining afebrile, recommend MBS to assess patient's current swallowing function given patient and RN reports of difficulty swallowing and location of stroke. Patient verbalized understanding and agreement. MBS will be performed later this afternoon.    Skilled Therapeutic Interventions          Administered a BSE and cognitive-linguistic evaluation. Please see above for details.   SLP Assessment  Patient will need skilled Speech Lanaguage Pathology Services during CIR admission    Recommendations  SLP Diet Recommendations: Dysphagia 2 (Fine chop);Thin Liquid Administration via: Cup;Straw Medication Administration: Whole meds with puree Supervision: Patient able to self feed;Intermittent supervision to cue for compensatory strategies Compensations: Slow rate;Lingual sweep for clearance of pocketing;Follow solids with liquid;Small sips/bites Postural Changes and/or Swallow Maneuvers: Seated upright 90 degrees Oral Care Recommendations: Oral care BID Recommendations for Other Services: Neuropsych consult Patient destination: Home Follow up Recommendations: Outpatient SLP;24 hour supervision/assistance Equipment Recommended: To be determined    SLP Frequency 3 to 5 out of 7 days   SLP Duration  SLP Intensity  SLP Treatment/Interventions 17-21 days  Minumum of 1-2 x/day, 30 to 90 minutes  Cueing hierarchy;Dysphagia/aspiration precaution training;Environmental controls;Functional tasks;Patient/family education;Therapeutic Activities;Speech/Language facilitation;Internal/external aids    Pain No/Denies Pain   Prior Functioning Type of Home: House  Lives With: Son Available Help at Discharge: Family;Available 24 hours/day  Function:  Eating Eating   Modified Consistency Diet: Yes Eating Assist Level: More than reasonable amount of time;Set up assist for   Eating Set Up Assist For: Opening containers       Cognition Comprehension  Comprehension assist level: Understands basic 90% of the time/cues < 10% of the time  Expression   Expression assist level: Expresses basic 50 - 74% of the time/requires cueing 25 - 49% of the time. Needs to repeat parts of sentences.  Social Interaction Social Interaction assist level: Interacts appropriately 90% of the time - Needs monitoring or encouragement for participation or interaction.  Problem Solving Problem solving assist level: Solves basic 75 - 89% of the time/requires cueing 10 - 24% of the time  Memory Memory assist level: Recognizes or recalls 90% of the time/requires cueing < 10% of the time   Short Term Goals: Week 1: SLP Short Term Goal 1 (Week 1): Patient will utilize speech intelligibility strategies to maximize speech intelligibility at the phrase level to 75% with Min A verbal cues. SLP Short Term Goal 2 (Week 1): Patient will consume current diet with minimal overt s/s of aspiration with supervision verbal cues for use of swallowing compensatory strategies.   Refer to Care Plan for Long Term Goals  Recommendations for other services: Neuropsych  Discharge Criteria: Patient will be discharged from SLP if patient refuses treatment 3 consecutive times without medical reason, if treatment goals not met, if there is a change in medical status, if patient makes no progress towards goals or if patient is discharged from hospital.  The above assessment, treatment plan, treatment alternatives and goals were discussed and mutually agreed upon: by patient  [place] 04/14/2017, 3:47 PM

## 2017-04-14 NOTE — Progress Notes (Signed)
Subjective/Complaints: Pt states he's had hematuria since kidney stone with stent placement.  Report the stent removal was planned for 1 wk from today  Incont BM   ROS- neg CP, SOB, N/V/D  Objective: Vital Signs: Blood pressure (!) 154/94, pulse 94, temperature 98.1 F (36.7 C), temperature source Oral, resp. rate 18, height _0  (1.803 m), weight 71.2 kg (157 lb), SpO2 99 %. Mr Brain 70 Contrast  Result Date: 04/12/2017 CLINICAL DATA:  61 year old male with weakness and confusion. Left corona radiata/basal ganglia infarct earlier this month. Severe distal right vertebral artery stenosis on CTA head and neck earlier today. EXAM: MRI HEAD WITHOUT CONTRAST TECHNIQUE: Multiplanar, multiecho pulse sequences of the brain and surrounding structures were obtained without intravenous contrast. COMPARISON:  CTA head and neck 0156 hours today. Brain MRI 03/20/2017 FINDINGS: The examination had to be discontinued prior to completion due to inability of the patient to cooperate. Axial and coronal diffusion weighted imaging, sagittal T1, and axial FLAIR in T2 weighted imaging was obtained. Brain: Continued trace diffusion signal abnormality in the left corona radiata tracking toward the left lentiform nuclei, but signal on the ADC map has normalized since August. Associated T2 and FLAIR hyperintensity with no mass effect or evidence of interval hemorrhage. New linear and patchy area of restricted diffusion in the right medulla (series 3, image 67 and series 6 image 14). Mild if any associated T2 and FLAIR hyperintensity. No associated hemorrhage or mass effect. No other posterior fossa restricted diffusion. No other restricted diffusion. No midline shift, mass effect, evidence of mass lesion, ventriculomegaly, extra-axial collection or acute intracranial hemorrhage. Cervicomedullary junction and pituitary are within normal limits. Periventricular and other scattered cerebral white matter signal abnormality is  stable and in areas resembles chronic lacunar infarcts. Stable chronic lacunar infarcts of both caudate nuclei. Vascular: Major intracranial vascular flow voids are stable. Skull and upper cervical spine: Stable. Sinuses/Orbits: Stable and negative orbits soft tissues. Stable left paranasal sinus mucosal thickening. Other: Stable left greater than right mastoid effusions. Negative scalp soft tissues. IMPRESSION: 1. Small acute right medullary infarct with no associated hemorrhage or mass effect. This is likely associated with the severe distal right vertebral artery stenosis demonstrated today by CTA. 2. Evolution of the left corona radiata/basal ganglia infarct since 03/20/2017 with no associated hemorrhage or mass effect. Underlying chronic cerebral white matter and basal ganglia small vessel ischemia. 3.  The examination had to be discontinued prior to completion. Electronically Signed   By: Genevie Ann M.D.   On: 04/12/2017 08:34   Results for orders placed or performed during the hospital encounter of 04/13/17 (from the past 72 hour(s))  CBC     Status: Abnormal   Collection Time: 04/13/17 10:47 PM  Result Value Ref Range   WBC 10.9 (H) 4.0 - 10.5 K/uL   RBC 4.80 4.22 - 5.81 MIL/uL   Hemoglobin 14.6 13.0 - 17.0 g/dL   HCT 43.5 39.0 - 52.0 %   MCV 90.6 78.0 - 100.0 fL   MCH 30.4 26.0 - 34.0 pg   MCHC 33.6 30.0 - 36.0 g/dL   RDW 13.6 11.5 - 15.5 %   Platelets 321 150 - 400 K/uL  Creatinine, serum     Status: None   Collection Time: 04/13/17 10:47 PM  Result Value Ref Range   Creatinine, Ser 0.85 0.61 - 1.24 mg/dL   GFR calc non Af Amer >60 >60 mL/min   GFR calc Af Amer >60 >60 mL/min    Comment: (NOTE)  The eGFR has been calculated using the CKD EPI equation. This calculation has not been validated in all clinical situations. eGFR's persistently <60 mL/min signify possible Chronic Kidney Disease.   CBC WITH DIFFERENTIAL     Status: Abnormal   Collection Time: 04/14/17  5:29 AM  Result  Value Ref Range   WBC 10.6 (H) 4.0 - 10.5 K/uL   RBC 4.93 4.22 - 5.81 MIL/uL   Hemoglobin 14.8 13.0 - 17.0 g/dL   HCT 44.4 39.0 - 52.0 %   MCV 90.1 78.0 - 100.0 fL   MCH 30.0 26.0 - 34.0 pg   MCHC 33.3 30.0 - 36.0 g/dL   RDW 14.0 11.5 - 15.5 %   Platelets 376 150 - 400 K/uL   Neutrophils Relative % 69 %   Neutro Abs 7.3 1.7 - 7.7 K/uL   Lymphocytes Relative 21 %   Lymphs Abs 2.3 0.7 - 4.0 K/uL   Monocytes Relative 9 %   Monocytes Absolute 0.9 0.1 - 1.0 K/uL   Eosinophils Relative 1 %   Eosinophils Absolute 0.1 0.0 - 0.7 K/uL   Basophils Relative 0 %   Basophils Absolute 0.0 0.0 - 0.1 K/uL  Comprehensive metabolic panel     Status: Abnormal   Collection Time: 04/14/17  5:29 AM  Result Value Ref Range   Sodium 143 135 - 145 mmol/L   Potassium 3.6 3.5 - 5.1 mmol/L   Chloride 109 101 - 111 mmol/L   CO2 24 22 - 32 mmol/L   Glucose, Bld 103 (H) 65 - 99 mg/dL   BUN 11 6 - 20 mg/dL   Creatinine, Ser 0.85 0.61 - 1.24 mg/dL   Calcium 9.5 8.9 - 10.3 mg/dL   Total Protein 6.9 6.5 - 8.1 g/dL   Albumin 4.1 3.5 - 5.0 g/dL   AST 38 15 - 41 U/L   ALT 46 17 - 63 U/L   Alkaline Phosphatase 108 38 - 126 U/L   Total Bilirubin 0.8 0.3 - 1.2 mg/dL   GFR calc non Af Amer >60 >60 mL/min   GFR calc Af Amer >60 >60 mL/min    Comment: (NOTE) The eGFR has been calculated using the CKD EPI equation. This calculation has not been validated in all clinical situations. eGFR's persistently <60 mL/min signify possible Chronic Kidney Disease.    Anion gap 10 5 - 15     HEENT: normal and dysarthric Cardio: RRR and no murmur Resp: CTA B/L and unlabored GI: BS positive and NT, ND Extremity:  Pulses positive and No Edema Skin:   Intact Neuro: Alert/Oriented, Cranial Nerve Abnormalities Left central VII, Normal Sensory, Abnormal Motor 2- Left delt, Bi, tri, finger flexor/ext, hip/knee extensor synergy, 0/5 at ankle and Dysarthric Musc/Skel:  Other no pain with UE or LE ROM Gen  NAD   Assessment/Plan: 1. Functional deficits secondary to acute left hemiparesis, subacute right hemiparesis which require 3+ hours per day of interdisciplinary therapy in a comprehensive inpatient rehab setting. Physiatrist is providing close team supervision and 24 hour management of active medical problems listed below. Physiatrist and rehab team continue to assess barriers to discharge/monitor patient progress toward functional and medical goals. FIM:       Function - Toileting Toileting steps completed by helper: Adjust clothing prior to toileting, Performs perineal hygiene, Adjust clothing after toileting Assist level: Set up/obtain supplies, Touching or steadying assistance (Pt.75%)           Function - Comprehension Comprehension: Auditory Comprehension assist level: Follows basic  conversation/direction with no assist  Function - Expression Expression: Verbal (dysphagia) Expression assist level: Expresses basic 75 - 89% of the time/requires cueing 10 - 24% of the time. Needs helper to occlude trach/needs to repeat words.  Function - Social Interaction Social Interaction assist level: Interacts appropriately with others with medication or extra time (anti-anxiety, antidepressant).  Function - Problem Solving Problem solving assist level: Solves complex 90% of the time/cues < 10% of the time  Function - Memory Memory assist level: More than reasonable amount of time Patient normally able to recall (first 3 days only): Current season, Staff names and faces, That he or she is in a hospital  Medical Problem List and Plan: 1. Right hemiparesis as well as left side weakness/dysarthriasecondary to acute lacunar infarction involving the right medulla oblongata and medial aspect. Subacute large vessel left MCA infarct due to intracranial stenosis-dual antiplatelet ASA and Plavix Rehab evals today 2. DVT Prophylaxis/Anticoagulation: Subcutaneous Lovenox. Monitor platelet  counts of any signs of bleeding 3. Pain Management/chronic back pain: Oxycodone as needed 4. Mood: Xanax 0.5 mg 3 times a day as needed 5. Neuropsych: This patient iscapable of making decisions on hisown behalf. 6. Skin/Wound Care: Routine skin checks 7. Fluids/Electrolytes/Nutrition: Routine I&O's with follow-up chemistries 8.Hyperlipidemia. Lipitor 9.Tobacco abuse counseling 10.Recent left ureteral stone. Has hematuria potentiated by anticoagulants, will monitor hgb Continue Flomax. Follow-up urology services 11.Hypertension. Norvasc 5 mg daily. Monitor with increased mobility, will monitor, permissive for the next week then will start adjusting tighter control 12.  Mild leukocytosis, afebrile LOS (Days) 1 A FACE TO FACE EVALUATION WAS PERFORMED  KIRSTEINS,ANDREW E 04/14/2017, 7:11 AM

## 2017-04-14 NOTE — Progress Notes (Signed)
Social Work Assessment and Plan Social Work Assessment and Plan  Patient Details  Name: Brendan Charles MRN: [id number] Date of Birth: [birth year]  Today's Date: 04/14/2017  Problem List:  Patient Active Problem List   Diagnosis Date Noted  . Left middle cerebral artery stroke ([facility]) 04/13/2017  . Weakness   . Acute ischemic stroke ([facility]) 04/12/2017  . Stroke (cerebrum) ([facility]) 04/12/2017  . Acute CVA (cerebrovascular accident) ([facility]) 04/12/2017  . CVA (cerebral vascular accident) ([facility]) 03/20/2017  . Hydronephrosis, left 03/20/2017  . Aortic atherosclerosis ([facility]) 03/20/2017  . Hepatitis C 02/14/2017  . Encounter for screening colonoscopy 02/14/2017  . Spondylolisthesis at L3-L4 level 06/03/2014   Past Medical History:  Past Medical History:  Diagnosis Date  . Anxiety   . Chronic back pain   . CVA (cerebral vascular accident) ([facility]) 03/20/2017  . Hepatitis C    HEP C, never treated, 2007   Past Surgical History:  Past Surgical History:  Procedure Laterality Date  . CERVICAL DISC SURGERY  2001   anterior  . COLONOSCOPY WITH PROPOFOL N/A 03/14/2017   Procedure: COLONOSCOPY WITH PROPOFOL;  Surgeon: [place] Bali, MD;  [facility];  Service: Endoscopy;  Laterality: N/A;  8:15am  . CYSTOSCOPY W/ URETERAL STENT PLACEMENT Left 03/23/2017   Procedure: CYSTOSCOPY WITH LEFT RETROGRADE PYELOGRAM/URETERAL LEFT STENT PLACEMENT;  Surgeon: Malen Gauze, MD;  [facility];  Service: Urology;  Laterality: Left;  . LUMBAR SPINE SURGERY  2015  . POLYPECTOMY  03/14/2017   Procedure: POLYPECTOMY;  Surgeon: [place] Bali, MD;  [facility];  Service: Endoscopy;;  rectum   Social History:  reports that he has quit smoking. His smoking use included Cigarettes. He has a 22.50 pack-year smoking history. He has never used smokeless tobacco. He reports that he does not drink alcohol or use drugs.  Family / Support Systems Marital Status: Divorced Patient Roles: Parent,  Other (Comment) (retiree) Children: Joseph-son [phone] Other Supports: Two other son's Anticipated Caregiver: Three son's will work together to provide care to pt Ability/Limitations of Caregiver: They work or go to [employer] Jomarie Longs doesn't work and can be there with him Caregiver Availability: 24/7 Family Dynamics: Close knit family who assist one another, pt was recovering form his first stroke when this happened. He is frustrated and somewhat depressed he has to start all over again. He does not want to burden his boys with his care.  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   Abuse/Neglect Physical Abuse: Denies Verbal Abuse: Denies Sexual Abuse: Denies Exploitation of patient/patient's resources: Denies Self-Neglect: Denies  Emotional Status Pt's affect, behavior adn adjustment status: Pt has always been independent and able to care for himself, he had recovered from his first stroke and was doing well until this. He is frustrated he has to start all over again. He hopes he can do as well but his one is worse than before. Recent Psychosocial Issues: other health issues-recent CVA in 03/2017 Pyschiatric History: No history but is feeling depressed and crying due to this stroke. Do feel he would benefit from seeing neuro-psych while here for his coping.  Substance Abuse History: Quit tobacco-no other issues  Patient / Family Perceptions, Expectations & Goals Pt/Family understanding of illness & functional limitations: Pt and son can explain  his stroke and deficits. Son went through this with her before. Pt has talked to the MD and feels his questions are being answered. He wants to know reason so they can  stop. Premorbid pt/family roles/activities: Dad, Retiree, Friend, etc Anticipated changes in roles/activities/participation: resume Pt/family expectations/goals: Pt states: " I want to take care of myself like I was doing."  Son states: " I hope he does well I know he wants too."  [employer] IncCommunity Resources Community Agencies: None Premorbid Home Care/DME Agencies: Other (Comment) (had [facility] following ) Transportation available at discharge: Arrow ElectronicsSon's Resource referrals recommended: Neuropsychology, Support group (specify)  Discharge Planning Living Arrangements: Children Support Systems: Children, Friends/neighbors Type of Residence: Private residence Insurance Resources: Media plannerrivate Insurance (specify) Investment banker, operational([employer]HC-Medicare) Financial Resources: SSD Financial Screen Referred: No Living Expenses: Own Money Management: Patient Does the patient have any problems obtaining your medications?: No Home Management: pt and son Patient/Family Preliminary Plans: Return home with son whom lives with pt and pt reports is there and not working. Pt may require 24 hr care and his son;s are going to work out a plan for this. Await team evaluations and work on safe plan for pt. Social Work Anticipated Follow Up Needs: HH/OP, Support Group  Clinical Impression Pleasant gentleman who is motivated to do well and quite frustrated with having another stroke so recent to his first one. His son's are supportive and willing to help him at home. Will make neuro-psych referral for Coping. Will work on safe plan for discharge.  Brendan Charles, Brendan Charles 04/14/2017, 1:20 PM

## 2017-04-14 NOTE — Progress Notes (Signed)
   04/14/17 1300  Clinical Encounter Type  Visited With Patient;Patient and family together  Visit Type Spiritual support  Referral From Nurse  Consult/Referral To Chaplain  Recommendations (advanced directive)  Spiritual Encounters  Spiritual Needs Literature  Stress Factors  Patient Stress Factors None identified  Family Stress Factors (stress)  Chaplain TG met with son Wille Glaser) until patient arrived back in the room from a procedure.  Brendan Charles expressed wanting to get an advanced directive done for his father.  Patient is a stroke patient and has lost the ability to write his name fully.  Son is going to gather witnesses (will use volunteers) if necessary to get advanced directive signed on Thursday (9/13).    Brendan Charles expressed a certain level of stress in seeing his father stuck in a bed after suffering stroke 2x.  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 We discussed methods of stress reduction in a healthy way.  This emotion will not fully resolve itself until dad is out of the hospital but son will use coping mechanisms.  Chaplain will follow up next week

## 2017-04-14 NOTE — Progress Notes (Signed)
Modified Barium Swallow Progress Note  Patient Details  Name: Brendan Charles MRN: [id number] Date of Birth: 1956-02-10  Today's Date: 04/14/2017  Modified Barium Swallow completed.  Full report located under Chart Review in the Imaging Section.  Brief recommendations include the following:  Clinical Impression    Patient demonstrates a moderate oropharyngeal dysphagia. Oral phase is characterized by poor oral control and bolus cohesion leading to premature spillage of consistencies leading to intermittent aspiration of both thin and nectar thick liquids before the swallow.  Patient also demonstrates a delayed swallow initiation with decreased base of tongue retraction with mild-moderate vallecular and pyriform sinus residue that is intermittently aspirated.  However, there are decreased residuals with nectar-thick liquids leading to decreased episodes of aspiration. Although it appears patient has been tolerating his current diet (dysphagia 3 with thin liquids) indicated by clear lung sounds without fever, recommend a conservative diet of Dys. 1 textures with nectar-thick liquids, water protocol with focus on pharyngeal strengthening exercises and RMT. Discussed results of MBS with the patient and his son and both verbalized understanding and agreement.   Swallow Evaluation Recommendations       SLP Diet Recommendations: Dysphagia 1 (Puree) solids;Free water protocol after oral care;Nectar thick liquid   Liquid Administration via: Cup   Medication Administration: Crushed with puree   Supervision: Patient able to self feed;Full supervision/cueing for compensatory strategies   Compensations: Slow rate;Follow solids with liquid;Small sips/bites;Minimize environmental distractions;Multiple dry swallows after each bite/sip;Clear throat intermittently   Postural Changes: Seated upright at 90 degrees   Oral Care Recommendations: Oral care BID   Other Recommendations: Order thickener from  pharmacy;Have oral suction available;Prohibited food (jello, ice cream, thin soups);Remove water pitcher    [name] 04/14/2017,4:19 PM

## 2017-04-15 ENCOUNTER — Inpatient Hospital Stay (HOSPITAL_COMMUNITY): Payer: Medicare Other | Admitting: Speech Pathology

## 2017-04-15 ENCOUNTER — Inpatient Hospital Stay (HOSPITAL_COMMUNITY): Payer: Medicare Other | Admitting: Physical Therapy

## 2017-04-15 ENCOUNTER — Inpatient Hospital Stay (HOSPITAL_COMMUNITY): Payer: Medicare Other | Admitting: Occupational Therapy

## 2017-04-15 DIAGNOSIS — I10 Essential (primary) hypertension: Secondary | ICD-10-CM

## 2017-04-15 DIAGNOSIS — E785 Hyperlipidemia, unspecified: Secondary | ICD-10-CM

## 2017-04-15 MED ORDER — RESOURCE THICKENUP CLEAR PO POWD
ORAL | Status: DC | PRN
  Filled 2017-04-15 (×2): qty 125

## 2017-04-15 MED ORDER — STARCH (THICKENING) PO POWD
ORAL | Status: DC | PRN
  Filled 2017-04-15: qty 227

## 2017-04-15 NOTE — Progress Notes (Signed)
Physical Therapy Session Note  Patient Details  Name: Brendan Charles MRN: [id number] Date of Birth: [birth year]  Today's Date: 04/15/2017 PT Individual Time: [phone] AND 1600-1630   82mn and 30 min   Short Term Goals: Week 1:  PT Short Term Goal 1 (Week 1): Pt will perform functional transfers with min assist PT Short Term Goal 2 (Week 1): Pt will be able to gait x 25' with mod assist PT Short Term Goal 3 (Week 1): Pt will be able to perform 4 steps with max assist to prepare for home entry  Skilled Therapeutic Interventions/Progress Updates:   [facility]  [facility]   Squat pivot transfer to mat table with mod assist from Pt. Moderate cues for proper set up and sequencing to improve safety of transfer.   Sit>supine completed with mod assist from PT and min cues for LLE control.   Supine NMR completed with BLE :  SAQ. x12 Heel slides  x12 Isometric hip abduction x10 Hip adduction/adduction with min assist from PT to reduce compensatory movement. X 10  Ankle PF with level 2 tband x 15BLE  Ankle DF, AROM x 20 BLE.  Bridges x 10   PNF dynamic reversals and stabilizign reversals for thoracic and for lumber rotation x 5 each, bilaterally.   Moderate cues for improved control of movement, decreased speed of eccentric movement, and reduced compensatory movement   Patient returned to room and [facility] and all needs met.    Session 2.   Pt received supine in bed and agreeable to PT. Supine>sit transfer with max assist and max cues.   Car transfer training instructed by PT with max assist. Max cues from PT for sequencing and set up.   Sit<>stand and initiation of gait training in parallel bars. Mod assist for sit<>stand and mod assist to maintain balance in standing. Genu recurvatum noted on the LLE in stance.   Pt returned to room and  performed Squat pivot transfer to bed with max assist. Sit>supine completed with mod assist and left supine in bed with call bell in reach and all needs met.            Therapy Documentation Precautions:  Precautions Precautions: Fall Precaution Comments: new L hemiparesis; recent CVA with R sided weakness Restrictions Weight Bearing Restrictions: No Vital Signs: Therapy Vitals Temp: 98.3 F (36.8 C) Temp Source: Oral Pulse Rate: (!) 101 Resp: 18 BP: (!) 144/88 Patient Position (if appropriate): Lying Oxygen Therapy SpO2: 96 % O2 Device: Not Delivered Pain: 0/10   See Function Navigator for Current Functional Status.   Therapy/Group: Individual Therapy  ALorie Phenix9/03/2017, 7:56 AM

## 2017-04-15 NOTE — Progress Notes (Signed)
Brendan GreenhouseDouglas B Depolo is a 61 y.o. male 1956-05-16 [id number]  Subjective: No new complaints. No new problems. Slept well. Feeling OK.  Objective: Vital signs in last 24 hours: Temp:  [98.3 F (36.8 C)-98.9 F (37.2 C)] 98.3 F (36.8 C) (09/08 0400) Pulse Rate:  [98-101] 101 (09/08 0400) Resp:  [17-18] 18 (09/08 0400) BP: (144-148)/(88-94) 144/88 (09/08 0400) SpO2:  [96 %-97 %] 96 % (09/08 0400) Weight change:  Last BM Date: 04/15/17  Intake/Output from previous day: 09/07 0701 - 09/08 0700 In: 660 [P.O.:660] Out: 475 [Urine:475] Last cbgs: CBG (last 3)  No results for input(s): GLUCAP in the last 72 hours.   Physical Exam General: No apparent distress   HEENT: not dry Lungs: Normal effort. Lungs clear to auscultation, no crackles or wheezes. Cardiovascular: Regular rate and rhythm, no edema Abdomen: S/NT/ND; BS(+) Musculoskeletal:  unchanged Neurological: No new neurological deficits Wounds: N/A    Skin: clear  Mental state: Alert, oriented, cooperative    Lab Results: BMET    Component Value Date/Time   NA 143 04/14/2017 0529   K 3.6 04/14/2017 0529   CL 109 04/14/2017 0529   CO2 24 04/14/2017 0529   GLUCOSE 103 (H) 04/14/2017 0529   BUN 11 04/14/2017 0529   CREATININE 0.85 04/14/2017 0529   CALCIUM 9.5 04/14/2017 0529   GFRNONAA >60 04/14/2017 0529   GFRAA >60 04/14/2017 0529   CBC    Component Value Date/Time   WBC 10.6 (H) 04/14/2017 0529   RBC 4.93 04/14/2017 0529   HGB 14.8 04/14/2017 0529   HCT 44.4 04/14/2017 0529   PLT 376 04/14/2017 0529   MCV 90.1 04/14/2017 0529   MCH 30.0 04/14/2017 0529   MCHC 33.3 04/14/2017 0529   RDW 14.0 04/14/2017 0529   LYMPHSABS 2.3 04/14/2017 0529   MONOABS 0.9 04/14/2017 0529   EOSABS 0.1 04/14/2017 0529   BASOSABS 0.0 04/14/2017 0529    Studies/Results: Dg Swallowing Func-speech Pathology  Result Date: 04/14/2017 Objective Swallowing Evaluation: Type of Study: MBS-Modified Barium Swallow Study Patient  Details Name: Brendan Charles MRN: [id number] Date of Birth: 1956-05-16 Today's Date: 04/14/2017 Time: SLP Start Time (ACUTE ONLY): 1300-SLP Stop Time (ACUTE ONLY): 1345 SLP Time Calculation (min) (ACUTE ONLY): 45 min Past Medical History: Past Medical History: Diagnosis Date . Anxiety  . Chronic back pain  . CVA (cerebral vascular accident) (HCC) 03/20/2017 . Hepatitis C   HEP C, never treated, 2007 Past Surgical History: Past Surgical History: Procedure Laterality Date . CERVICAL DISC SURGERY  2001  anterior . COLONOSCOPY WITH PROPOFOL N/A 03/14/2017  Procedure: COLONOSCOPY WITH PROPOFOL;  Surgeon: West BaliFields, Sandi L, MD;  Location: [facility];  Service: Endoscopy;  Laterality: N/A;  8:15am . CYSTOSCOPY W/ URETERAL STENT PLACEMENT Left 03/23/2017  Procedure: CYSTOSCOPY WITH LEFT RETROGRADE PYELOGRAM/URETERAL LEFT STENT PLACEMENT;  Surgeon: Malen GauzeMcKenzie, Patrick L, MD;  Location: [facility];  Service: Urology;  Laterality: Left; . LUMBAR SPINE SURGERY  2015 . POLYPECTOMY  03/14/2017  Procedure: POLYPECTOMY;  Surgeon: West BaliFields, Sandi L, MD;  Location: [facility];  Service: Endoscopy;;  rectum HPI: Brendan GreenhouseDouglas B Saks is a 61 y.o. male with history of stroke recently discharged 2 weeks ago presents to the ER with complaints of sudden onset of numbness and weakness of the left upper extremity around 9:30 PM last night (04/11/2017). Patient denies any difficulty speaking and swallowing or any visual symptoms. Patient was admitted 2 weeks ago for stroke with right-sided weakness and at that time patient had stroke workup  and was placed on aspirin and Plavix and statins. Patient states he has been taking his medications. MRI shows acute right medullary infarct. Subjective: "I had a hard time chewing today." Assessment / Plan / Recommendation CHL IP CLINICAL IMPRESSIONS 04/14/2017 Clinical Impression Patient demonstrates a moderate oropharyngeal dysphagia. Oral phase is characterized by poor oral control and bolus cohesion leading to  premature spillage of consistencies leading to intermittent aspiration of both thin and nectar thick liquids before the swallow.  Patient also demonstrates a delayed swallow initiation with decreased base of tongue retraction with mild-moderate vallecular and pyriform sinus residue that is intermittently aspirated.  However, there are decreased residuals with nectar-thick liquids leading to decreased episodes of aspiration. Although it appears patient has been tolerating his current diet (dysphagia 3 with thin liquids) indicated by clear lung sounds without fever, recommend a conservative diet of Dys. 1 textures with nectar-thick liquids, water protocol with focus on pharyngeal strengthening exercises and RMT. Discussed results of MBS with the patient and his son and both verbalized understanding and agreement.  SLP Visit Diagnosis Dysphagia, oropharyngeal phase (R13.12) Attention and concentration deficit following -- Frontal lobe and executive function deficit following -- Impact on safety and function Moderate aspiration risk;Severe aspiration risk   CHL IP TREATMENT RECOMMENDATION 04/14/2017 Treatment Recommendations Therapy as outlined in treatment plan below   Prognosis 04/14/2017 Prognosis for Safe Diet Advancement Good Barriers to Reach Goals Severity of deficits Barriers/Prognosis Comment -- CHL IP DIET RECOMMENDATION 04/14/2017 SLP Diet Recommendations Dysphagia 1 (Puree) solids;Free water protocol after oral care;Nectar thick liquid Liquid Administration via Cup Medication Administration Crushed with puree Compensations Slow rate;Follow solids with liquid;Small sips/bites;Minimize environmental distractions;Multiple dry swallows after each bite/sip;Clear throat intermittently Postural Changes Seated upright at 90 degrees   CHL IP OTHER RECOMMENDATIONS 04/14/2017 Recommended Consults -- Oral Care Recommendations Oral care BID Other Recommendations Order thickener from pharmacy;Have oral suction  available;Prohibited food (jello, ice cream, thin soups);Remove water pitcher   CHL IP FOLLOW UP RECOMMENDATIONS 04/14/2017 Follow up Recommendations Inpatient Rehab   CHL IP FREQUENCY AND DURATION 04/14/2017 Speech Therapy Frequency (ACUTE ONLY) min 5x/week Treatment Duration 2 weeks      CHL IP ORAL PHASE 04/14/2017 Oral Phase Impaired Oral - Pudding Teaspoon -- Oral - Pudding Cup -- Oral - Honey Teaspoon Premature spillage;Decreased bolus cohesion;Delayed oral transit;Reduced posterior propulsion Oral - Honey Cup NT Oral - Nectar Teaspoon Weak lingual manipulation;Delayed oral transit;Decreased bolus cohesion;Premature spillage;Reduced posterior propulsion Oral - Nectar Cup Weak lingual manipulation;Reduced posterior propulsion;Premature spillage;Delayed oral transit Oral - Nectar Straw -- Oral - Thin Teaspoon Premature spillage;Decreased bolus cohesion;Delayed oral transit;Weak lingual manipulation Oral - Thin Cup Delayed oral transit;Premature spillage;Decreased bolus cohesion;Weak lingual manipulation Oral - Thin Straw -- Oral - Puree Reduced posterior propulsion;Premature spillage;Decreased bolus cohesion;Weak lingual manipulation;Delayed oral transit Oral - Mech Soft -- Oral - Regular -- Oral - Multi-Consistency -- Oral - Pill -- Oral Phase - Comment --  CHL IP PHARYNGEAL PHASE 04/14/2017 Pharyngeal Phase Impaired Pharyngeal- Pudding Teaspoon -- Pharyngeal -- Pharyngeal- Pudding Cup -- Pharyngeal -- Pharyngeal- Honey Teaspoon Delayed swallow initiation-pyriform sinuses;Reduced tongue base retraction;Pharyngeal residue - valleculae;Penetration/Apiration after swallow Pharyngeal Material enters airway, CONTACTS cords and not ejected out Pharyngeal- Honey Cup -- Pharyngeal -- Pharyngeal- Nectar Teaspoon Delayed swallow initiation-vallecula;Reduced tongue base retraction;Penetration/Aspiration during swallow;Pharyngeal residue - valleculae Pharyngeal Material enters airway, remains ABOVE vocal cords then ejected out  Pharyngeal- Nectar Cup Delayed swallow initiation-vallecula;Penetration/Apiration after swallow;Penetration/Aspiration during swallow;Pharyngeal residue - valleculae Pharyngeal Material enters airway, passes BELOW cords without attempt  by patient to eject out (silent aspiration);Material enters airway, remains ABOVE vocal cords then ejected out Pharyngeal- Nectar Straw -- Pharyngeal -- Pharyngeal- Thin Teaspoon Penetration/Aspiration before swallow;Reduced tongue base retraction;Delayed swallow initiation-pyriform sinuses Pharyngeal Material enters airway, passes BELOW cords and not ejected out despite cough attempt by patient Pharyngeal- Thin Cup Delayed swallow initiation-pyriform sinuses;Reduced tongue base retraction;Penetration/Apiration after swallow;Penetration/Aspiration during swallow Pharyngeal Material enters airway, passes BELOW cords without attempt by patient to eject out (silent aspiration) Pharyngeal- Thin Straw -- Pharyngeal -- Pharyngeal- Puree Delayed swallow initiation-vallecula;Pharyngeal residue - valleculae;Reduced tongue base retraction Pharyngeal -- Pharyngeal- Mechanical Soft -- Pharyngeal -- Pharyngeal- Regular -- Pharyngeal -- Pharyngeal- Multi-consistency -- Pharyngeal -- Pharyngeal- Pill -- Pharyngeal -- Pharyngeal Comment --  CHL IP CERVICAL ESOPHAGEAL PHASE 04/14/2017 Cervical Esophageal Phase Impaired Pudding Teaspoon -- Pudding Cup -- Honey Teaspoon -- Honey Cup -- Nectar Teaspoon -- Nectar Cup -- Nectar Straw -- Thin Teaspoon -- Thin Cup -- Thin Straw -- Puree -- Mechanical Soft -- Regular -- Multi-consistency -- Pill -- Cervical Esophageal Comment -- No flowsheet data found. PAYNE, COURTNEY 04/14/2017, 4:09 PM  Feliberto Gottron, MA, CCC-SLP [phone]              Medications: I have reviewed the patient's current medications.  Assessment/Plan:  1. Right hemiparesis as well as left side weakness/dysarthriasecondary to acute lacunar infarction involving the right medulla oblongata  and medial aspect. Subacute large vessel left MCA infarct due to intracranial stenosis. On ASA, Plavix CIR 2. DVT proph w/Lovenox 3. LBP, chronic. On Oxycodone. 4. Anxiety. Xanax prn 5. Dyslipidemia - Lipitor 6. HTN. Norvasc qd   Length of stay, days: 2  Sonda Primes , MD 04/15/2017, 12:38 PM

## 2017-04-15 NOTE — Progress Notes (Signed)
Speech Language Pathology Daily Session Note  Patient Details  Name: Brendan Charles MRN: [id number] Date of Birth: 07-Feb-1956  Today's Date: 04/15/2017 SLP Individual Time:  -     Short Term Goals: Week 1: SLP Short Term Goal 1 (Week 1): Patient will utilize speech intelligibility strategies to maximize speech intelligibility at the phrase level to 75% with Min A verbal cues. SLP Short Term Goal 2 (Week 1): Patient will consume current diet with minimal overt s/s of aspiration with supervision verbal cues for use of swallowing compensatory strategies.  SLP Short Term Goal 3 (Week 1): Patient will perform pharyngeal strengthening exercises with supervision verbal cues for accuracy.  SLP Short Term Goal 4 (Week 1): Patient will consume trials of thin liquids with minimal overt s/s of aspiration over 3 sessions to assess readiness for repeat MBS.   Skilled Therapeutic Interventions: Skilled treatment session focused on dysphagia and speech goals. Upon arrival, patient was consuming his lunch meal of Dys. 1 textures with nectar-thick liquids. Patient consumed meal without overt s/s of aspiration but required Mod A verbal cues for use of swallowing compensatory strategies. Patient also educated on pharyngeal strengthening exercises and required Max A verbal cues and extra time to complete accurately. Patient was verbalizing at the phrase level and was ~25% intelligible with Max A verbal cues needed for an increased vocal intensity. Patient left upright in bed with all needs within reach and son present. Continue with current plan of care.      Function:  Eating Eating   Modified Consistency Diet: Yes Eating Assist Level: More than reasonable amount of time;Set up assist for;Supervision or verbal cues   Eating Set Up Assist For: Opening containers       Cognition Comprehension Comprehension assist level: Understands basic 90% of the time/cues < 10% of the time  Expression   Expression  assist level: Expresses basic 25 - 49% of the time/requires cueing 50 - 75% of the time. Uses single words/gestures.  Social Interaction Social Interaction assist level: Interacts appropriately 90% of the time - Needs monitoring or encouragement for participation or interaction.;Interacts appropriately with others with medication or extra time (anti-anxiety, antidepressant).  Problem Solving Problem solving assist level: Solves complex 90% of the time/cues < 10% of the time  Memory Memory assist level: More than reasonable amount of time    Pain No/Denies Pain   Therapy/Group: Individual Therapy  Kessie Croston 04/15/2017, 3:07 PM

## 2017-04-15 NOTE — Progress Notes (Signed)
Occupational Therapy Session Note  Patient Details  Name: Brendan Charles MRN: [id number] Date of Birth: 06-30-1956  Today's Date: 04/15/2017 OT Individual Time: 0800-0900 OT Individual Time Calculation (min): 60 min   Short Term Goals: Week 1:  OT Short Term Goal 1 (Week 1): Pt will use L UE for bathing tasks with Min A OT Short Term Goal 2 (Week 1): Pt will maintain standing for 2 mins in order to complete grooming tasks at the sink.   OT Short Term Goal 3 (Week 1): Pt will maintain midline during bathing tasks while seated with supervision OT Short Term Goal 4 (Week 1): Pt will don shirt with min A  Skilled Therapeutic Interventions/Progress Updates:    OT treatment session focused on modified bathing/dressing, functional use of L UE, toilet transfers, and standing balance. Pt reported need for bathroom upon OT arrival. Pt transitioned out of bed with min A to advance LE's and Mod A to elevate trunk. Max A squat-pivot to L to wc. Stand-pivot to wide BSC over toilet with grab bars and Mod A to R. Pt with successful BM and worked on hip hike for toileting while reaching behind with R UE. Max A stand-pivot to L with facilitation for head/hips relationship. Pt transferred in similar fashion in and out of shower. Guided A to integrate L UE into bathing tasks with pt being able to bring L UE to head today for ~ 30 seconds to assist with washing hair. Educated pt on hemi-dressing techniques with pt needing assistance to thread L UE, orient shirt, find head hole, and pull shirt down over trunk. Pt with poor trunk control and often falling back before being able to pull shirt down. Worked on sit<>stands at the sink and standing balance while OT assisted with pulling up pants. OT then set-up with breakfast tray and worked on self-feeding with pt able to take appropriate sized bites and managing feeding utensils using R hand. Pt initiated use of L UE as a plate stabilizer with min cues. Pt left seated in wc  at end of session with needs met.   Therapy Documentation Precautions:  Precautions Precautions: Fall Precaution Comments: new L hemiparesis; recent CVA with R sided weakness Restrictions Weight Bearing Restrictions: No Pain:  none/denies pain ADL: ADL ADL Comments: Please see functional navigator  See Function Navigator for Current Functional Status.   Therapy/Group: Individual Therapy  [name] 04/15/2017, 8:18 AM

## 2017-04-16 DIAGNOSIS — F419 Anxiety disorder, unspecified: Secondary | ICD-10-CM

## 2017-04-16 NOTE — Progress Notes (Signed)
Patient ID: Brendan Charles, male   DOB: 11-26-1955, 61 y.o.   MRN: [id number]  Brendan Charles is a 61 y.o. male Jan 06, 1956 [id number]  Subjective: No new complaints. No new problems. Slept well. Feeling OK.  Objective: Vital signs in last 24 hours: Temp:  [97.3 F (36.3 C)-97.5 F (36.4 C)] 97.5 F (36.4 C) (09/09 0300) Pulse Rate:  [98-108] 98 (09/09 0300) Resp:  [18] 18 (09/09 0300) BP: (142)/(90-92) 142/90 (09/09 0300) SpO2:  [97 %-98 %] 97 % (09/09 0300) Weight change:  Last BM Date: 04/15/17  Intake/Output from previous day: 09/08 0701 - 09/09 0700 In: 300 [P.O.:300] Out: -  Last cbgs: CBG (last 3)  No results for input(s): GLUCAP in the last 72 hours.   Physical Exam General: No apparent distress   HEENT: not dry Lungs: Normal effort. Lungs clear to auscultation, no crackles or wheezes. Cardiovascular: Regular rate and rhythm, no edema Abdomen: S/NT/ND; BS(+) Musculoskeletal:  unchanged Neurological: No new neurological deficits Wounds: N/A    Skin: clear Mental state: Alert, oriented, cooperative    Lab Results: BMET    Component Value Date/Time   NA 143 04/14/2017 0529   K 3.6 04/14/2017 0529   CL 109 04/14/2017 0529   CO2 24 04/14/2017 0529   GLUCOSE 103 (H) 04/14/2017 0529   BUN 11 04/14/2017 0529   CREATININE 0.85 04/14/2017 0529   CALCIUM 9.5 04/14/2017 0529   GFRNONAA >60 04/14/2017 0529   GFRAA >60 04/14/2017 0529   CBC    Component Value Date/Time   WBC 10.6 (H) 04/14/2017 0529   RBC 4.93 04/14/2017 0529   HGB 14.8 04/14/2017 0529   HCT 44.4 04/14/2017 0529   PLT 376 04/14/2017 0529   MCV 90.1 04/14/2017 0529   MCH 30.0 04/14/2017 0529   MCHC 33.3 04/14/2017 0529   RDW 14.0 04/14/2017 0529   LYMPHSABS 2.3 04/14/2017 0529   MONOABS 0.9 04/14/2017 0529   EOSABS 0.1 04/14/2017 0529   BASOSABS 0.0 04/14/2017 0529    Studies/Results: Dg Swallowing Func-speech Pathology  Result Date: 04/14/2017 Objective Swallowing Evaluation:  Type of Study: MBS-Modified Barium Swallow Study Patient Details Name: Brendan Charles MRN: [id number] Date of Birth: July 14, 1956 Today's Date: 04/14/2017 Time: SLP Start Time (ACUTE ONLY): 1300-SLP Stop Time (ACUTE ONLY): 1345 SLP Time Calculation (min) (ACUTE ONLY): 45 min Past Medical History: Past Medical History: Diagnosis Date . Anxiety  . Chronic back pain  . CVA (cerebral vascular accident) (HCC) 03/20/2017 . Hepatitis C   HEP C, never treated, 2007 Past Surgical History: Past Surgical History: Procedure Laterality Date . CERVICAL DISC SURGERY  2001  anterior . COLONOSCOPY WITH PROPOFOL N/A 03/14/2017  Procedure: COLONOSCOPY WITH PROPOFOL;  Surgeon: West Bali, MD;  Location: [facility];  Service: Endoscopy;  Laterality: N/A;  8:15am . CYSTOSCOPY W/ URETERAL STENT PLACEMENT Left 03/23/2017  Procedure: CYSTOSCOPY WITH LEFT RETROGRADE PYELOGRAM/URETERAL LEFT STENT PLACEMENT;  Surgeon: Malen Gauze, MD;  Location: [facility];  Service: Urology;  Laterality: Left; . LUMBAR SPINE SURGERY  2015 . POLYPECTOMY  03/14/2017  Procedure: POLYPECTOMY;  Surgeon: West Bali, MD;  Location: [facility];  Service: Endoscopy;;  rectum HPI: Brendan Charles is a 61 y.o. male with history of stroke recently discharged 2 weeks ago presents to the ER with complaints of sudden onset of numbness and weakness of the left upper extremity around 9:30 PM last night (04/11/2017). Patient denies any difficulty speaking and swallowing or any visual symptoms. Patient was admitted  2 weeks ago for stroke with right-sided weakness and at that time patient had stroke workup and was placed on aspirin and Plavix and statins. Patient states he has been taking his medications. MRI shows acute right medullary infarct. Subjective: "I had a hard time chewing today." Assessment / Plan / Recommendation CHL IP CLINICAL IMPRESSIONS 04/14/2017 Clinical Impression Patient demonstrates a moderate oropharyngeal dysphagia. Oral phase is characterized  by poor oral control and bolus cohesion leading to premature spillage of consistencies leading to intermittent aspiration of both thin and nectar thick liquids before the swallow.  Patient also demonstrates a delayed swallow initiation with decreased base of tongue retraction with mild-moderate vallecular and pyriform sinus residue that is intermittently aspirated.  However, there are decreased residuals with nectar-thick liquids leading to decreased episodes of aspiration. Although it appears patient has been tolerating his current diet (dysphagia 3 with thin liquids) indicated by clear lung sounds without fever, recommend a conservative diet of Dys. 1 textures with nectar-thick liquids, water protocol with focus on pharyngeal strengthening exercises and RMT. Discussed results of MBS with the patient and his son and both verbalized understanding and agreement.  SLP Visit Diagnosis Dysphagia, oropharyngeal phase (R13.12) Attention and concentration deficit following -- Frontal lobe and executive function deficit following -- Impact on safety and function Moderate aspiration risk;Severe aspiration risk   CHL IP TREATMENT RECOMMENDATION 04/14/2017 Treatment Recommendations Therapy as outlined in treatment plan below   Prognosis 04/14/2017 Prognosis for Safe Diet Advancement Good Barriers to Reach Goals Severity of deficits Barriers/Prognosis Comment -- CHL IP DIET RECOMMENDATION 04/14/2017 SLP Diet Recommendations Dysphagia 1 (Puree) solids;Free water protocol after oral care;Nectar thick liquid Liquid Administration via Cup Medication Administration Crushed with puree Compensations Slow rate;Follow solids with liquid;Small sips/bites;Minimize environmental distractions;Multiple dry swallows after each bite/sip;Clear throat intermittently Postural Changes Seated upright at 90 degrees   CHL IP OTHER RECOMMENDATIONS 04/14/2017 Recommended Consults -- Oral Care Recommendations Oral care BID Other Recommendations Order thickener  from pharmacy;Have oral suction available;Prohibited food (jello, ice cream, thin soups);Remove water pitcher   CHL IP FOLLOW UP RECOMMENDATIONS 04/14/2017 Follow up Recommendations Inpatient Rehab   CHL IP FREQUENCY AND DURATION 04/14/2017 Speech Therapy Frequency (ACUTE ONLY) min 5x/week Treatment Duration 2 weeks      CHL IP ORAL PHASE 04/14/2017 Oral Phase Impaired Oral - Pudding Teaspoon -- Oral - Pudding Cup -- Oral - Honey Teaspoon Premature spillage;Decreased bolus cohesion;Delayed oral transit;Reduced posterior propulsion Oral - Honey Cup NT Oral - Nectar Teaspoon Weak lingual manipulation;Delayed oral transit;Decreased bolus cohesion;Premature spillage;Reduced posterior propulsion Oral - Nectar Cup Weak lingual manipulation;Reduced posterior propulsion;Premature spillage;Delayed oral transit Oral - Nectar Straw -- Oral - Thin Teaspoon Premature spillage;Decreased bolus cohesion;Delayed oral transit;Weak lingual manipulation Oral - Thin Cup Delayed oral transit;Premature spillage;Decreased bolus cohesion;Weak lingual manipulation Oral - Thin Straw -- Oral - Puree Reduced posterior propulsion;Premature spillage;Decreased bolus cohesion;Weak lingual manipulation;Delayed oral transit Oral - Mech Soft -- Oral - Regular -- Oral - Multi-Consistency -- Oral - Pill -- Oral Phase - Comment --  CHL IP PHARYNGEAL PHASE 04/14/2017 Pharyngeal Phase Impaired Pharyngeal- Pudding Teaspoon -- Pharyngeal -- Pharyngeal- Pudding Cup -- Pharyngeal -- Pharyngeal- Honey Teaspoon Delayed swallow initiation-pyriform sinuses;Reduced tongue base retraction;Pharyngeal residue - valleculae;Penetration/Apiration after swallow Pharyngeal Material enters airway, CONTACTS cords and not ejected out Pharyngeal- Honey Cup -- Pharyngeal -- Pharyngeal- Nectar Teaspoon Delayed swallow initiation-vallecula;Reduced tongue base retraction;Penetration/Aspiration during swallow;Pharyngeal residue - valleculae Pharyngeal Material enters airway, remains ABOVE  vocal cords then ejected out Pharyngeal- Nectar Cup Delayed swallow initiation-vallecula;Penetration/Apiration  after swallow;Penetration/Aspiration during swallow;Pharyngeal residue - valleculae Pharyngeal Material enters airway, passes BELOW cords without attempt by patient to eject out (silent aspiration);Material enters airway, remains ABOVE vocal cords then ejected out Pharyngeal- Nectar Straw -- Pharyngeal -- Pharyngeal- Thin Teaspoon Penetration/Aspiration before swallow;Reduced tongue base retraction;Delayed swallow initiation-pyriform sinuses Pharyngeal Material enters airway, passes BELOW cords and not ejected out despite cough attempt by patient Pharyngeal- Thin Cup Delayed swallow initiation-pyriform sinuses;Reduced tongue base retraction;Penetration/Apiration after swallow;Penetration/Aspiration during swallow Pharyngeal Material enters airway, passes BELOW cords without attempt by patient to eject out (silent aspiration) Pharyngeal- Thin Straw -- Pharyngeal -- Pharyngeal- Puree Delayed swallow initiation-vallecula;Pharyngeal residue - valleculae;Reduced tongue base retraction Pharyngeal -- Pharyngeal- Mechanical Soft -- Pharyngeal -- Pharyngeal- Regular -- Pharyngeal -- Pharyngeal- Multi-consistency -- Pharyngeal -- Pharyngeal- Pill -- Pharyngeal -- Pharyngeal Comment --  CHL IP CERVICAL ESOPHAGEAL PHASE 04/14/2017 Cervical Esophageal Phase Impaired Pudding Teaspoon -- Pudding Cup -- Honey Teaspoon -- Honey Cup -- Nectar Teaspoon -- Nectar Cup -- Nectar Straw -- Thin Teaspoon -- Thin Cup -- Thin Straw -- Puree -- Mechanical Soft -- Regular -- Multi-consistency -- Pill -- Cervical Esophageal Comment -- No flowsheet data found. PAYNE, COURTNEY 04/14/2017, 4:09 PM  Feliberto Gottron, MA, CCC-SLP [phone]              Medications: I have reviewed the patient's current medications.  Assessment/Plan:   1. Right hemiparesis as well as left side weakness/dysarthriasecondary to acute lacunar infarction  involving the right medulla oblongata and medial aspect. Subacute large vessel left MCA infarct due to intracranial stenosis. Cont w/ASA and Plavix. CIR 2. DVT proph w/Lovenox 3. LBP - Oxycodone prn 4. Anxiety - Xanax prn 5. Dyslipidemia - on Lipitor 6. HTN - cont w/Norvasc     Length of stay, days: 3  Sonda Primes , MD 04/16/2017, 9:11 AM

## 2017-04-17 ENCOUNTER — Inpatient Hospital Stay (HOSPITAL_COMMUNITY): Payer: Medicare Other | Admitting: Speech Pathology

## 2017-04-17 ENCOUNTER — Inpatient Hospital Stay (HOSPITAL_COMMUNITY): Admit: 2017-04-17 | Payer: Medicare Other

## 2017-04-17 ENCOUNTER — Encounter (HOSPITAL_COMMUNITY): Admission: RE | Admit: 2017-04-17 | Payer: Medicare Other | Source: Ambulatory Visit

## 2017-04-17 ENCOUNTER — Inpatient Hospital Stay (HOSPITAL_COMMUNITY): Payer: Medicare Other | Admitting: Occupational Therapy

## 2017-04-17 ENCOUNTER — Inpatient Hospital Stay (HOSPITAL_COMMUNITY): Payer: Medicare Other | Admitting: Physical Therapy

## 2017-04-17 DIAGNOSIS — G8194 Hemiplegia, unspecified affecting left nondominant side: Secondary | ICD-10-CM

## 2017-04-17 DIAGNOSIS — I639 Cerebral infarction, unspecified: Secondary | ICD-10-CM

## 2017-04-17 NOTE — Progress Notes (Signed)
Subjective/Complaints:   Pt states he slept poorly due to multiple BM, no diarrhea, no abd pain  ROS- neg CP, SOB, N/V/D  Objective: Vital Signs: Blood pressure (!) 149/86, pulse 95, temperature 97.8 F (36.6 C), temperature source Oral, resp. rate 18, height 5\' 11"  (1.803 m), weight 71.2 kg (157 lb), SpO2 96 %. No results found. No results found for this or any previous visit (from the past 72 hour(s)).   HEENT: normal and dysarthric Cardio: RRR and no murmur Resp: CTA B/L and unlabored GI: BS positive and NT, ND Extremity:  Pulses positive and No Edema Skin:   Intact Neuro: Alert/Oriented, Cranial Nerve Abnormalities Left central VII, Normal Sensory, Abnormal Motor 2- Left delt, Bi, tri, finger flexor/ext, hip/knee extensor synergy, 0/5 at ankle and Dysarthric Musc/Skel:  Other no pain with UE or LE ROM Gen NAD   Assessment/Plan: 1. Functional deficits secondary to acute left hemiparesis, subacute right hemiparesis which require 3+ hours per day of interdisciplinary therapy in a comprehensive inpatient rehab setting. Physiatrist is providing close team supervision and 24 hour management of active medical problems listed below. Physiatrist and rehab team continue to assess barriers to discharge/monitor patient progress toward functional and medical goals. FIM: Function - Bathing Position: Shower Body parts bathed by patient: Right arm, Left arm, Chest, Abdomen, Front perineal area, Left upper leg, Right upper leg Body parts bathed by helper: Buttocks, Right lower leg, Left lower leg, Back Assist Level: Touching or steadying assistance(Pt > 75%)  Function- Upper Body Dressing/Undressing What is the patient wearing?: Pull over shirt/dress Pull over shirt/dress - Perfomed by patient: Thread/unthread right sleeve, Put head through opening Pull over shirt/dress - Perfomed by helper: Thread/unthread left sleeve, Pull shirt over trunk Assist Level: Touching or steadying  assistance(Pt > 75%) Function - Lower Body Dressing/Undressing What is the patient wearing?: Pants, Non-skid slipper socks Position: Wheelchair/chair at sink Pants- Performed by patient: Thread/unthread right pants leg Pants- Performed by helper: Thread/unthread left pants leg, Pull pants up/down Non-skid slipper socks- Performed by helper: Don/doff right sock, Don/doff left sock Assist for footwear: Dependant Assist for lower body dressing: Touching or steadying assistance (Pt > 75%)  Function - Toileting Toileting steps completed by helper: Adjust clothing prior to toileting, Performs perineal hygiene, Adjust clothing after toileting Toileting Assistive Devices: Grab bar or rail Assist level: Two helpers  Function - ArchivistToilet Transfers Toilet transfer assistive device: Grab bar Assist level to toilet: 2 helpers Assist level from toilet: 2 helpers  Function - Chair/bed transfer Chair/bed transfer method: Squat pivot Chair/bed transfer assist level: Moderate assist (Pt 50 - 74%/lift or lower) Chair/bed transfer assistive device: Armrests  Function - Locomotion: Wheelchair Will patient use wheelchair at discharge?: Yes Type: Manual Max wheelchair distance: 5450' Assist Level: Supervision or verbal cues Assist Level: Touching or steadying assistance (Pt > 75%) Wheel 150 feet activity did not occur: Safety/medical concerns Turns around,maneuvers to table,bed, and toilet,negotiates 3% grade,maneuvers on rugs and over doorsills: No Function - Locomotion: Ambulation Assistive device: Parallel bars Max distance: 605ft  Assist level: Moderate assist (Pt 50 - 74%) Walk 10 feet activity did not occur: Safety/medical concerns Walk 50 feet with 2 turns activity did not occur: Safety/medical concerns Walk 150 feet activity did not occur: Safety/medical concerns Walk 10 feet on uneven surfaces activity did not occur: Safety/medical concerns  Function - Comprehension Comprehension:  Auditory Comprehension assist level: Understands basic 75 - 89% of the time/ requires cueing 10 - 24% of the time  Function - Expression  Expression: Verbal Expression assist level: Expresses basic 90% of the time/requires cueing < 10% of the time.  Function - Social Interaction Social Interaction assist level: Interacts appropriately with others with medication or extra time (anti-anxiety, antidepressant).  Function - Problem Solving Problem solving assist level: Solves complex 90% of the time/cues < 10% of the time  Function - Memory Memory assist level: More than reasonable amount of time Patient normally able to recall (first 3 days only): Current season, Location of own room, Staff names and faces, That he or she is in a hospital  Medical Problem List and Plan: 1. Right hemiparesis as well as left side weakness/dysarthriasecondary to acute lacunar infarction involving the right medulla oblongata and medial aspect. Subacute large vessel left MCA infarct due to intracranial stenosis-dual antiplatelet ASA and Plavix CIR PT OT 2. DVT Prophylaxis/Anticoagulation: Subcutaneous Lovenox. Monitor platelet counts of any signs of bleeding 3. Pain Management/chronic back pain: Oxycodone as needed 4. Mood: Xanax 0.5 mg 3 times a day as needed 5. Neuropsych: This patient iscapable of making decisions on hisown behalf. 6. Skin/Wound Care: Routine skin checks 7. Fluids/Electrolytes/Nutrition: Routine I&O's with follow-up chemistries 8.Hyperlipidemia. Lipitor 9.Tobacco abuse counseling 10.Recent left ureteral stone. Has hematuria potentiated by anticoagulants, will monitor hgb Continue Flomax. Follow-up urology services, has stent placement, scheduled for removal which will be delayed by CVA, uro f/u as OP 11.Hypertension. Norvasc 5 mg daily. Vitals:   04/16/17 1438 04/17/17 0456  BP: 127/89 (!) 149/86  Pulse: (!) 122 95  Resp: 18 18  Temp: 97.8 F (36.6 C) 97.8 F (36.6 C)  SpO2:  95% 96%  12.  Mild leukocytosis, afebrile 13.  Tachycardia- no anemia, volume status looks normal, EKG show sinus rhythm- will monitor   LOS (Days) 4 A FACE TO FACE EVALUATION WAS PERFORMED  Linna Thebeau E 04/17/2017, 7:34 AM

## 2017-04-17 NOTE — Progress Notes (Signed)
Occupational Therapy Session Note  Patient Details  Name: Brendan Charles MRN: [id number] Date of Birth: 05/11/1956  Today's Date: 04/17/2017 OT Individual Time: 0800-0900 OT Individual Time Calculation (min): 60 min    Short Term Goals: Week 1:  OT Short Term Goal 1 (Week 1): Pt will use L UE for bathing tasks with Min A OT Short Term Goal 2 (Week 1): Pt will maintain standing for 2 mins in order to complete grooming tasks at the sink.   OT Short Term Goal 3 (Week 1): Pt will maintain midline during bathing tasks while seated with supervision OT Short Term Goal 4 (Week 1): Pt will don shirt with min A  Skilled Therapeutic Interventions/Progress Updates:    OT treatment session focused on modified bathing/dressing, activity tolerance, balance, and transfers. Pt transferred to sitting with increased time and min A to elevate trunk. Pt with posterior LOB upon sitting with poor trunk control requiring mod/max A to achieve sitting posture initially, progressing to min guard A. Squat-pivot transfers bec>wc>drop arm commode>wc>tub bench with Mod A. Pt needed min to Mod A to maintain dynamic sitting balance on tub bench today with lateral LOB when leaning outside base of support to wash body parts. Instructional cues and hand over hand A to integrate L UE into bathing tasks 50% of opportunities. Pt fatigues quickly and needed multiple rest breaks between BADL tasks. B UE coordination with w/c propulsion in room to collect clothing. Dressing completed w/c level at the sink with increased time and min A to don shirt. Worked on sitting balance while in figure 4 position to don socks. Pt needed min?mod A to maintain sitting balance when reaching forward to attempt to don socks. Pt then completed oral care at the sink with supervision and set up. Administered water per water protocol with 1 large cough on second sip and verbal cues for size of sip and throat clear. Pt left seated in wc with safety belt on and  needs met.   Therapy Documentation Precautions:  Precautions Precautions: Fall Precaution Comments: new L hemiparesis; recent CVA with R sided weakness Restrictions Weight Bearing Restrictions: No Pain:  none/denies pain ADL: ADL ADL Comments: Please see functional navigator  See Function Navigator for Current Functional Status.   Therapy/Group: Individual Therapy  Brendan Charles 04/17/2017, 8:16 AM

## 2017-04-17 NOTE — Progress Notes (Signed)
Physical Therapy Session Note  Patient Details  Name: Brendan Charles MRN: [name] Date of Birth: 1956-05-28  Today's Date: 04/17/2017 PT Individual Time: [id number] PT Individual Time Calculation (min): 68 min   Short Term Goals: Week 1:  PT Short Term Goal 1 (Week 1): Pt will perform functional transfers with min assist PT Short Term Goal 2 (Week 1): Pt will be able to gait x 25' with mod assist PT Short Term Goal 3 (Week 1): Pt will be able to perform 4 steps with max assist to prepare for home entry  Skilled Therapeutic Interventions/Progress Updates:    no c/o pain at rest.  Session focus on postural control, NMR for LEs and trunk, activity tolerance, and transfers.   Pt requires mod assist for supine>sit with significant assist to bring trunk forward.  Max assist for squat/pivot throughout initial part of session.  Blocked practice for squat/pivot transfers L and R with max fade to mod cues for forward weight shift and head/hips relationship, fade to mod assist with practice.  Gait training 2x30' with rail in hallway (L and R rail), max assist to facilitate upright posture, weight shift, and for BLEs advance/place/stabilize.  Significant ataxia v apraxia with ambulation, better on second trial.  Nustep x10 minutes with BLEs and BUEs at level 4 focus on reciprocal stepping pattern retraining, forced use, coordination, and activity tolerance.  Pt returned to room at end of session and positioned in recliner with son present and call bell in reach.  Son notified to alert nursing of pt position prior to leaving and agreeable.    Therapy Documentation Precautions:  Precautions Precautions: Fall Precaution Comments: new L hemiparesis; recent CVA with R sided weakness Restrictions Weight Bearing Restrictions: No   See Function Navigator for Current Functional Status.   Therapy/Group: Individual Therapy  Stephania FragminCaitlin E Kordelia Severin 04/17/2017, 3:27 PM

## 2017-04-17 NOTE — Progress Notes (Signed)
Speech Language Pathology Daily Session Note  Patient Details  Name: Brendan Charles MRN: [id number] Date of Birth: [birth year]  Today's Date: 04/17/2017 SLP Individual Time: 1105-1205 SLP Individual Time Calculation (min): 60 min  Short Term Goals: Week 1: SLP Short Term Goal 1 (Week 1): Patient will utilize speech intelligibility strategies to maximize speech intelligibility at the phrase level to 75% with Min A verbal cues. SLP Short Term Goal 2 (Week 1): Patient will consume current diet with minimal overt s/s of aspiration with supervision verbal cues for use of swallowing compensatory strategies.  SLP Short Term Goal 3 (Week 1): Patient will perform pharyngeal strengthening exercises with supervision verbal cues for accuracy.  SLP Short Term Goal 4 (Week 1): Patient will consume trials of thin liquids with minimal overt s/s of aspiration over 3 sessions to assess readiness for repeat MBS.  SLP Short Term Goal 5 (Week 1): Patient will perform 20 repetitions of EMST excerises with Mod A verbal cues for accuracy and a self-percieved effort level of 8/10.  SLP Short Term Goal 6 (Week 1): Patient will utilize diaphragmatic breathing at the word level with Mod A verbal cues to ahieve 100% intelligibility.   Skilled Therapeutic Interventions: Skilled treatment session focused on speech goals. SLP facilitated session by administering RMT. Patient demonstrated meaningful weakness in both his peak MIP and MEP. Patient's peak MIP was 10 cm H2O with the lower limit of normal at 52 cm H2O. Patient's peak MEP was 20 cm H2O with the lower limit of normal at 62 cm H2O. Patient performed EMST exercises at 5 cm H2O with a self-perceived effort level of 10/10. Patient unable to perform IMST at lowest setting despite Max A multimodal cues. Therefore, SLP focused on diaphragmatic breathing at the word level that patient was able to achieve in 75% of opportunities. Patient also consumed nectar-thick liquids without  overt s/s of aspiration. Patient left upright in bed with all needs within reach. Continue with current plan of care.      Function:  Eating Eating   Modified Consistency Diet: Yes Eating Assist Level: More than reasonable amount of time;Set up assist for;Supervision or verbal cues   Eating Set Up Assist For: Opening containers       Cognition Comprehension Comprehension assist level: Understands basic 75 - 89% of the time/ requires cueing 10 - 24% of the time  Expression   Expression assist level: Expresses basic 50 - 74% of the time/requires cueing 25 - 49% of the time. Needs to repeat parts of sentences.  Social Interaction Social Interaction assist level: Interacts appropriately with others with medication or extra time (anti-anxiety, antidepressant).  Problem Solving Problem solving assist level: Solves complex 90% of the time/cues < 10% of the time  Memory Memory assist level: More than reasonable amount of time    Pain No/Denies Pain   Therapy/Group: Individual Therapy  [name] 04/17/2017, 3:15 PM

## 2017-04-18 ENCOUNTER — Inpatient Hospital Stay (HOSPITAL_COMMUNITY): Payer: Medicare Other | Admitting: Occupational Therapy

## 2017-04-18 ENCOUNTER — Encounter (HOSPITAL_COMMUNITY): Payer: Medicare Other | Admitting: Psychology

## 2017-04-18 ENCOUNTER — Inpatient Hospital Stay (HOSPITAL_COMMUNITY): Payer: Medicare Other | Admitting: Physical Therapy

## 2017-04-18 ENCOUNTER — Inpatient Hospital Stay (HOSPITAL_COMMUNITY): Payer: Medicare Other | Admitting: Speech Pathology

## 2017-04-18 DIAGNOSIS — F4321 Adjustment disorder with depressed mood: Secondary | ICD-10-CM

## 2017-04-18 DIAGNOSIS — R269 Unspecified abnormalities of gait and mobility: Secondary | ICD-10-CM

## 2017-04-18 DIAGNOSIS — I69398 Other sequelae of cerebral infarction: Secondary | ICD-10-CM

## 2017-04-18 MED ORDER — TRAZODONE HCL 50 MG PO TABS
50.0000 mg | ORAL_TABLET | Freq: Every evening | ORAL | Status: DC | PRN
Start: 2017-04-18 — End: 2017-04-27
  Administered 2017-04-19 – 2017-04-25 (×6): 50 mg via ORAL
  Filled 2017-04-18 (×6): qty 1

## 2017-04-18 NOTE — Progress Notes (Signed)
Occupational Therapy Session Note  Patient Details  Name: Brendan Charles MRN: [id number] Date of Birth: 03-Jan-1956  Today's Date: 04/18/2017 OT Individual Time: 0830-1000 OT Individual Time Calculation (min): 90 min   Short Term Goals: Week 1:  OT Short Term Goal 1 (Week 1): Pt will use L UE for bathing tasks with Min A OT Short Term Goal 2 (Week 1): Pt will maintain standing for 2 mins in order to complete grooming tasks at the sink.   OT Short Term Goal 3 (Week 1): Pt will maintain midline during bathing tasks while seated with supervision OT Short Term Goal 4 (Week 1): Pt will don shirt with min A  Skilled Therapeutic Interventions/Progress Updates:    OT treatment session focused on modified bathing/dressing, trunk control, toileting, functional transfers, standing balance, and functional use of L UE,Worked on donning socks bed level with back/trunk supported and pt able to achieve figure 4 position and reach forward with B UE to pull on socks. L ataxia and decreased pinch strength makes L UE difficulty to use but pt utilizing it with min A. Squat-pivot transfer bed>wc>commode with Mod A. Pt with successful BM, worked on trunk control and hip hike while coordinating reach behind to complete toileting. Pt needed min guard A for balance and assistance for thoroughness. Bathing completed shower level with min A to integrate L UE into bathing tasks. UB dressing with increased time and min A to pull shirt over trunk. Worked on functional use of L UE within grooming tasks. Pt needed elbow support to bring L UE to head to brush hair. Pt brushed teeth with set-up A, then was administered water per protocol. Pt with 1 large courgh on first sip, then managed 4 more sips without cough. B UE coordination with wc propulsion. Sitting balance and trunk extension with reaching activity seated in therapy mat. Facilitated trunk extension and pelvic position using NDT techniques. L fine motor coordination with  graded peg board activity and medium soft putty exercises. Pt with anterior LOB when transferring back to wc requiring Max A to safely transfer onto wc. Pt left seated in wc with safety belt on and needs met.  Therapy Documentation Precautions:  Precautions Precautions: Fall Precaution Comments: new L hemiparesis; recent CVA with R sided weakness Restrictions Weight Bearing Restrictions: No Pain:  none/denies pain ADL: ADL ADL Comments: Please see functional navigator  See Function Navigator for Current Functional Status.   Therapy/Group: Individual Therapy  [name] 04/18/2017, 8:41 AM

## 2017-04-18 NOTE — Consult Note (Signed)
Neuropsychological Consultation   Patient:   Brendan Charles   DOB:   1955/09/24  MR Number:  [id number]  Location:  [facility] [address] [facility] [address] [id number] [place] [address] Dept: [phone] Loc: [phone]           Date of Service:   04/18/2017  Start Time:   2:30 PM End Time:   3:30 PM  Provider/Observer:  Arley Phenix, Psy.D.       Clinical Neuropsychologist       Billing Code/Service: [phone] Units  Chief Complaint:    Brendan Charles is a 61 year old right handed male with history of hep C and recent CVA with right hemiparesis due to left MCA infarct.  Admitted 8/13 and discharged 8/14 min assist.  04/12/2017 developed left sided weakness and numbness along with dysarthria.  MRI small acute right medullary infarct without hemorrhage or mass effect.  Left corona radiata basal ganglia infarct from 8/13 stroke.  Recommendation for physical medicine consult and admitted for CIR program.  Patient has continued to expressive issues with depression and adjustment issues following this most recent stroke.  He has recovered well from first one in August but has not recovered like before.  Tearful and continued expressive language issues.  Reason for Service:  Per Brendan Charles was referred for neuropsychological consultation due to ongoing coping issues, depressvie reaction and expressive language issues.  Below is the HPI for the current admission.    HPI: Brendan Charles 61 year old right-handed male with history of tobacco abuse, hepatitis C and recent CVA with right hemiparesis due to left MCA infarct admitted 03/20/2017 discharge 03/21/2017 ambulating minimal assist to 100 feet using a rolling walker. Discharged on aspirin and Plavix. Per chart review patient lives with family. Was using a walker prior to admission. 2 level home with bath and bedroom on main level and 5 steps to entry of home. Family can  assist as needed. He was able to complete ADL tasks with supervision for safety. He was receiving home therapies with advanced home care. Patient was doing well until 04/12/2017 with left-sided weakness and numbnessas well as severe dysarthria. CT/MRI showed small acute right medullary infarct no associated hemorrhage or mass effect. Evolution of left corona radiata basal ganglia infarct since 03/20/2017. CT angiogram head and neck with no dissection or aneurysm. No large vessel occlusion. Recent echocardiogram with ejection fraction of 65% overall motion abnormality. Patient also with recent left ureteral stone seen by urology Dr. Cleotis Lema underwent a cystoscopy 03/23/2017 showing moderate hydronephrosis no masses or lesions. Plan was for stone extraction in approximately 4 weeks. He had been placed on Flomax. Patient currently remains on aspirin and Plavix as prior to admission. Subcutaneous Lovenox for DVT prophylaxis. Mechanical soft thin liquid diet. Physical therapy evaluation completed 04/12/2017 with recommendations of physical medicine rehabilitation consult. Patient was admitted for comprehensive rehabilitation program.  Current Status:  The patient is having adjustment disorder with primary depressive response.  Denies any significant anxiety symptoms.   Behavioral Observation: Brendan Charles  presents as a 61 y.o.-year-old Right Caucasian Male who appeared his stated age. his dress was Appropriate and he was Well Groomed and his manners were Appropriate to the situation.  his participation was indicative of Appropriate and Redirectable behaviors.  There were physical disabilities noted.  he displayed an appropriate level of cooperation and motivation.     Interactions:    Active Appropriate and Redirectable  Attention:   abnormal and attention span appeared shorter than expected for age  Memory:   abnormal; remote memory intact, recent memory impaired  Visuo-spatial:  within  normal limits  Speech (Volume):  low  Speech:   non-fluent aphasia; slurred  Thought Process:  Coherent and Relevant  Though Content:  WNL;   Orientation:   person and time/date  Judgment:   Fair  Planning:   Fair  Affect:    Depressed and Tearful  Mood:    Depressed  Insight:   Present  Intelligence:   normal  Medical History:   Past Medical History:  Diagnosis Date  . Anxiety   . Chronic back pain   . CVA (cerebral vascular accident) (HCC) 03/20/2017  . Hepatitis C    HEP C, never treated, 2007   Family Med/Psych History:  Family History  Problem Relation Age of Onset  . Cancer Mother   . Colon cancer Neg Hx     Risk of Suicide/Violence: low Patient denies any SI or HI, although he does display significant issues with depression over loss of function.  Impression/DX:  Brendan Charles is a 61 year old right handed male with history of hep C and recent CVA with right hemiparesis due to left MCA infarct.  Admitted 8/13 and discharged 8/14 min assist.  04/12/2017 developed left sided weakness and numbness along with dysarthria.  MRI small acute right medullary infarct without hemorrhage or mass effect.  Left corona radiata basal ganglia infarct from 8/13 stroke.  Recommendation for physical medicine consult and admitted for CIR program.  Patient has continued to expressive issues with depression and adjustment issues following this most recent stroke.  He has recovered well from first one in August but has not recovered like before.  Tearful and continued expressive language issues.  The patient is having adjustment disorder with primary depressive response.  Denies any significant anxiety symptoms.   Disposition/Plan:  Will see again next week to follow-up with coping issues and depressive symptoms.         Electronically Signed   _______________________ Arley PhenixJohn Rodenbough, Psy.D.

## 2017-04-18 NOTE — Progress Notes (Signed)
Subjective/Complaints:    ROS- neg CP, SOB, N/V/D  Objective: Vital Signs: Blood pressure (!) 144/91, pulse 85, temperature 97.7 F (36.5 C), temperature source Oral, resp. rate 16, height 5\' 11"  (1.803 m), weight 71.2 kg (157 lb), SpO2 99 %. No results found. No results found for this or any previous visit (from the past 72 hour(s)).   HEENT: normal and dysarthric Cardio: RRR and no murmur Resp: CTA B/L and unlabored GI: BS positive and NT, ND Extremity:  Pulses positive and No Edema Skin:   Intact Neuro: Alert/Oriented, Cranial Nerve Abnormalities Left central VII, Normal Sensory, Abnormal Motor 2- Left delt, Bi, tri, finger flexor/ext, hip/knee extensor synergy, 0/5 at ankle and Dysarthric Musc/Skel:  Other no pain with UE or LE ROM Gen NAD   Assessment/Plan: 1. Functional deficits secondary to acute left hemiparesis, subacute right hemiparesis which require 3+ hours per day of interdisciplinary therapy in a comprehensive inpatient rehab setting. Physiatrist is providing close team supervision and 24 hour management of active medical problems listed below. Physiatrist and rehab team continue to assess barriers to discharge/monitor patient progress toward functional and medical goals. FIM: Function - Bathing Position: Shower Body parts bathed by patient: Right arm, Left arm, Chest, Abdomen, Front perineal area, Left upper leg, Right upper leg Body parts bathed by helper: Buttocks, Right lower leg, Left lower leg, Back Assist Level: Touching or steadying assistance(Pt > 75%)  Function- Upper Body Dressing/Undressing What is the patient wearing?: Pull over shirt/dress Pull over shirt/dress - Perfomed by patient: Thread/unthread right sleeve, Put head through opening Pull over shirt/dress - Perfomed by helper: Thread/unthread left sleeve, Pull shirt over trunk Assist Level: Touching or steadying assistance(Pt > 75%) Function - Lower Body Dressing/Undressing What is the  patient wearing?: Pants, Non-skid slipper socks Position: Wheelchair/chair at sink Pants- Performed by patient: Thread/unthread right pants leg Pants- Performed by helper: Thread/unthread left pants leg, Pull pants up/down Non-skid slipper socks- Performed by helper: Don/doff right sock, Don/doff left sock Assist for footwear: Dependant Assist for lower body dressing: Touching or steadying assistance (Pt > 75%)  Function - Toileting Toileting steps completed by patient: Performs perineal hygiene Toileting steps completed by helper: Adjust clothing prior to toileting, Adjust clothing after toileting Toileting Assistive Devices: Grab bar or rail Assist level: Touching or steadying assistance (Pt.75%)  Function - ArchivistToilet Transfers Toilet transfer assistive device: Grab bar Assist level to toilet: Maximal assist (Pt 25 - 49%/lift and lower) Assist level from toilet: Moderate assist (Pt 50 - 74%/lift or lower)  Function - Chair/bed transfer Chair/bed transfer method: Squat pivot Chair/bed transfer assist level: Maximal assist (Pt 25 - 49%/lift and lower) (best attempts mod assist) Chair/bed transfer assistive device: Armrests Chair/bed transfer details: Manual facilitation for weight shifting, Verbal cues for precautions/safety, Verbal cues for technique  Function - Locomotion: Wheelchair Will patient use wheelchair at discharge?: Yes Type: Manual Max wheelchair distance: 50' Assist Level: Supervision or verbal cues Assist Level: Touching or steadying assistance (Pt > 75%) Wheel 150 feet activity did not occur: Safety/medical concerns Turns around,maneuvers to table,bed, and toilet,negotiates 3% grade,maneuvers on rugs and over doorsills: No Function - Locomotion: Ambulation Assistive device: Rail in hallway Max distance: 30 Assist level: Maximal assist (Pt 25 - 49%) Walk 10 feet activity did not occur: Safety/medical concerns Assist level: Maximal assist (Pt 25 - 49%) Walk 50 feet  with 2 turns activity did not occur: Safety/medical concerns Walk 150 feet activity did not occur: Safety/medical concerns Walk 10 feet on uneven surfaces activity  did not occur: Safety/medical concerns  Function - Comprehension Comprehension: Auditory Comprehension assist level: Understands basic 75 - 89% of the time/ requires cueing 10 - 24% of the time  Function - Expression Expression: Verbal Expression assist level: Expresses basic 50 - 74% of the time/requires cueing 25 - 49% of the time. Needs to repeat parts of sentences.  Function - Social Interaction Social Interaction assist level: Interacts appropriately with others with medication or extra time (anti-anxiety, antidepressant).  Function - Problem Solving Problem solving assist level: Solves complex 90% of the time/cues < 10% of the time  Function - Memory Memory assist level: More than reasonable amount of time Patient normally able to recall (first 3 days only): Current season, Location of own room, Staff names and faces, That he or she is in a hospital  Medical Problem List and Plan: 1. Right hemiparesis as well as left side weakness/dysarthriasecondary to acute lacunar infarction involving the right medulla oblongata and medial aspect. Subacute large vessel left MCA infarct due to intracranial stenosis-dual antiplatelet ASA and Plavix CIR PT OT- team conf in am 2. DVT Prophylaxis/Anticoagulation: Subcutaneous Lovenox. Monitor platelet counts of any signs of bleeding 3. Pain Management/chronic back pain: Oxycodone as needed 4. Mood: Xanax 0.5 mg 3 times a day as needed 5. Neuropsych: This patient iscapable of making decisions on hisown behalf. 6. Skin/Wound Care: Routine skin checks 7. Fluids/Electrolytes/Nutrition: Routine I&O's with follow-up chemistries 8.Hyperlipidemia. Lipitor 9.Tobacco abuse counseling 10.Recent left ureteral stone. Has hematuria potentiated by anticoagulants, will monitor hgb Continue  Flomax. Follow-up urology services, has stent placement, scheduled for removal which will be delayed by CVA, uro f/u as OP 11.Hypertension. Norvasc 5 mg daily. Vitals:   04/17/17 1442 04/18/17 0611  BP: 123/83 (!) 144/91  Pulse: 94 85  Resp: 16 16  Temp: 98.4 F (36.9 C) 97.7 F (36.5 C)  SpO2: 98% 99%  12.  Mild leukocytosis, afebrile 13.  Tachycardia- no anemia, volume status looks normal, EKG show sinus rhythm- will monitor  14.  Insomnia - prn trazodone   LOS (Days) 5 A FACE TO FACE EVALUATION WAS PERFORMED  Brendan Charles E 04/18/2017, 7:40 AM

## 2017-04-18 NOTE — Progress Notes (Signed)
Speech Language Pathology Daily Session Note  Patient Details  Name: Brendan Charles MRN: [id number] Date of Birth: January 19, 1956  Today's Date: 04/18/2017 SLP Individual Time: 1030-1130 SLP Individual Time Calculation (min): 60 min  Short Term Goals: Week 1: SLP Short Term Goal 1 (Week 1): Patient will utilize speech intelligibility strategies to maximize speech intelligibility at the phrase level to 75% with Min A verbal cues. SLP Short Term Goal 2 (Week 1): Patient will consume current diet with minimal overt s/s of aspiration with supervision verbal cues for use of swallowing compensatory strategies.  SLP Short Term Goal 3 (Week 1): Patient will perform pharyngeal strengthening exercises with supervision verbal cues for accuracy.  SLP Short Term Goal 4 (Week 1): Patient will consume trials of thin liquids with minimal overt s/s of aspiration over 3 sessions to assess readiness for repeat MBS.  SLP Short Term Goal 5 (Week 1): Patient will perform 20 repetitions of EMST excerises with Mod A verbal cues for accuracy and a self-percieved effort level of 8/10.  SLP Short Term Goal 6 (Week 1): Patient will utilize diaphragmatic breathing at the word level with Mod A verbal cues to ahieve 100% intelligibility.   Skilled Therapeutic Interventions: Skilled treatment session focused on speech and dysphagia goals. SLP facilitated session by providing Min A verbal cues for accuracy during EMST exercises. Patient performed 20 repetitions with a self-perceived effort level of 10/10. Patient performed oral care via the suction toothbrush and and consumed trials of thin liquids via tsp without overt s/s of aspiration. Recommend continued trials of thin with SLP only. Patient also verbalized at the word level with 75% intelligibility and Max A verbal cues for use of speech intelligibility strategies. Patient left upright in wheelchair with quick release belt in place and all needs within reach. Continue with  current plan of care.      Function:   Cognition Comprehension Comprehension assist level: Understands basic 75 - 89% of the time/ requires cueing 10 - 24% of the time  Expression   Expression assist level: Expresses basic 50 - 74% of the time/requires cueing 25 - 49% of the time. Needs to repeat parts of sentences.  Social Interaction Social Interaction assist level: Interacts appropriately with others with medication or extra time (anti-anxiety, antidepressant).  Problem Solving Problem solving assist level: Solves complex 90% of the time/cues < 10% of the time  Memory Memory assist level: More than reasonable amount of time    Pain No/Denies Pain   Therapy/Group: Individual Therapy  [name] 04/18/2017, 2:04 PM

## 2017-04-18 NOTE — Progress Notes (Signed)
Physical Therapy Session Note  Patient Details  Name: Brendan Charles MRN: [id number] Date of Birth: 04-Mar-1956  Today's Date: 04/18/2017 PT Individual Time: 5300-5110 PT Individual Time Calculation (min): 58 min   Short Term Goals: Week 1:  PT Short Term Goal 1 (Week 1): Pt will perform functional transfers with min assist PT Short Term Goal 2 (Week 1): Pt will be able to gait x 25' with mod assist PT Short Term Goal 3 (Week 1): Pt will be able to perform 4 steps with max assist to prepare for home entry  Skilled Therapeutic Interventions/Progress Updates:    c/o sore all over but laughs and does not rate.  Session focus on NMR for UEs/LEs/trunk.    Pt requires min/mod assist for sit<>supine throughout session.  Squat/pivot with mod assist and mod cues for set up, sequencing, and head hips relationship.    NMR on therapy mat as below: x10-12 reps heel slides bilaterally x10 reps trunk rotation in hook lying x10 reps bridges with adductor hold x10 reps L/R hip add/abd from hook lying position 2x7 reps side lying reach for targeted oblique contraction on L side x10 reps anterior/posterior pelvic tilt in sitting with mod facilitation from therapist  W/C propulsion with BUEs for coordination and strengthening, pt returned to room at end of session, refused to stay up in chair.  PT provided education on importance of maintaining out of bed during day, but pt continued to decline.  Pt returned to bed with mod assist for LEs, call bell in reach and needs met.   Therapy Documentation Precautions:  Precautions Precautions: Fall Precaution Comments: new L hemiparesis; recent CVA with R sided weakness Restrictions Weight Bearing Restrictions: No   See Function Navigator for Current Functional Status.   Therapy/Group: Individual Therapy  Michel Santee 04/18/2017, 4:28 PM

## 2017-04-19 ENCOUNTER — Inpatient Hospital Stay (HOSPITAL_COMMUNITY): Payer: Medicare Other | Admitting: Physical Therapy

## 2017-04-19 ENCOUNTER — Inpatient Hospital Stay (HOSPITAL_COMMUNITY): Payer: Medicare Other | Admitting: Occupational Therapy

## 2017-04-19 ENCOUNTER — Inpatient Hospital Stay (HOSPITAL_COMMUNITY): Payer: Medicare Other | Admitting: Speech Pathology

## 2017-04-19 DIAGNOSIS — I69352 Hemiplegia and hemiparesis following cerebral infarction affecting left dominant side: Secondary | ICD-10-CM

## 2017-04-19 DIAGNOSIS — F4321 Adjustment disorder with depressed mood: Secondary | ICD-10-CM

## 2017-04-19 NOTE — Progress Notes (Signed)
Speech Language Pathology Daily Session Note  Patient Details  Name: Brendan Charles MRN: [id number] Date of Birth: [birth year]  Today's Date: 04/19/2017 SLP Individual Time: 1000-1100 SLP Individual Time Calculation (min): 60 min  Short Term Goals: Week 1: SLP Short Term Goal 1 (Week 1): Patient will utilize speech intelligibility strategies to maximize speech intelligibility at the phrase level to 75% with Min A verbal cues. SLP Short Term Goal 2 (Week 1): Patient will consume current diet with minimal overt s/s of aspiration with supervision verbal cues for use of swallowing compensatory strategies.  SLP Short Term Goal 3 (Week 1): Patient will perform pharyngeal strengthening exercises with supervision verbal cues for accuracy.  SLP Short Term Goal 4 (Week 1): Patient will consume trials of thin liquids with minimal overt s/s of aspiration over 3 sessions to assess readiness for repeat MBS.  SLP Short Term Goal 5 (Week 1): Patient will perform 20 repetitions of EMST/IMST excerises with Mod A verbal cues for accuracy and a self-percieved effort level of 8/10.  SLP Short Term Goal 6 (Week 1): Patient will utilize diaphragmatic breathing at the word level with Mod A verbal cues to ahieve 100% intelligibility.   Skilled Therapeutic Interventions: Skilled treatment session focused on speech and dysphagia goals. SLP facilitated session by providing supervision verbal cues for accuracy during EMST/IMST exercises. Patient performed 20 repetitions with a self-perceived effort level of 10/10. Patient performed oral care via the suction toothbrush and and consumed trials of thin liquids via tsp without overt s/s of aspiration. Recommend continued trials of thin with SLP only. Patient also verbalized at the phrase level with 75% intelligibility and Mod A verbal cues for use of speech intelligibility strategies. Patient left upright in wheelchair with quick release belt in place and all needs within reach.  Continue with current plan of care.      Function:  Eating Eating   Modified Consistency Diet: Yes Eating Assist Level: More than reasonable amount of time;Set up assist for;Supervision or verbal cues           Cognition Comprehension Comprehension assist level: Understands basic 75 - 89% of the time/ requires cueing 10 - 24% of the time  Expression   Expression assist level: Expresses basic 50 - 74% of the time/requires cueing 25 - 49% of the time. Needs to repeat parts of sentences.  Social Interaction Social Interaction assist level: Interacts appropriately with others with medication or extra time (anti-anxiety, antidepressant).  Problem Solving Problem solving assist level: Solves complex 90% of the time/cues < 10% of the time  Memory Memory assist level: More than reasonable amount of time    Pain Pain Assessment Pain Assessment: No/denies pain Pain Score: 0-No pain  Therapy/Group: Individual Therapy  [name] 04/19/2017, 3:14 PM

## 2017-04-19 NOTE — Patient Care Conference (Signed)
Inpatient RehabilitationTeam Conference and Plan of Care Update Date: 04/19/2017   Time: 11:35 AM    Patient Name: Brendan Charles      Medical Record Number: [id number]  Date of Birth: November 08, 1955 Sex: Male         Room/Bed: 4M07C/4M07C-01 Payor Info: Payor: [employer] MEDICARE / Plan: [employer] MEDICARE / Product Type: *No Product type* /    Admitting Diagnosis: cva  Admit Date/Time:  04/13/2017  3:08 PM Admission Comments: No comment available   Primary Diagnosis:  Spastic hemiparesis of left dominant side as late effect of cerebral infarction [facility](HCC) Principal Problem: Spastic hemiparesis of left dominant side as late effect of cerebral infarction [facility](HCC)  Patient Active Problem List   Diagnosis Date Noted  . Spastic hemiparesis of left dominant side as late effect of cerebral infarction (HCC) 04/19/2017  . Gait disturbance, post-stroke 04/18/2017  . Adjustment disorder with depressed mood   . Cerebral infarction due to thrombosis of right middle cerebral artery (HCC) 04/13/2017  . Weakness   . Acute ischemic stroke (HCC) 04/12/2017  . Stroke (cerebrum) (HCC) 04/12/2017  . Acute CVA (cerebrovascular accident) (HCC) 04/12/2017  . CVA (cerebral vascular accident) (HCC) 03/20/2017  . Hydronephrosis, left 03/20/2017  . Aortic atherosclerosis (HCC) 03/20/2017  . Hepatitis C 02/14/2017  . Encounter for screening colonoscopy 02/14/2017  . Spondylolisthesis at L3-L4 level 06/03/2014    Expected Discharge Date: Expected Discharge Date: 05/05/17  Team Members Present: Physician leading conference: Dr. Claudette LawsAndrew Kirsteins Social Worker Present: Dossie DerBecky Gentry Pilson, LCSW Nurse Present: Chrissie NoaMelanie Barnes, RN PT Present: Teodoro Kilaitlin Penven-Crew, PT OT Present: Kearney HardElisabeth Doe, OT SLP Present: Feliberto Gottronourtney Payne, SLP PPS Coordinator present : Edson SnowballBecky Windsor, PT     Current Status/Progress Goal Weekly Team Focus  Medical   negative outlook, poor balance, bilateral CVAs  home with family assist  work on  balance and mood   Bowel/Bladder   continent of bowel and bladder incontinent at times  continent of bowel and bladder  Assist with tolieting needs prn   Swallow/Nutrition/ Hydration   Dys. 1 textures with nectar-thick liquids, Mod I  Mod I  trials of upgraded liquids   ADL's   Mod/max A overall  Suoervision/Mod I goals  Ataxia, sitting/standing balance, activity tolerance, modified bathing/dressing, NMR   Mobility   mod for transfers, max/total for gait with rail  supervision overall, min on stairs  NMR, balance, gait    Communication             Safety/Cognition/ Behavioral Observations  Mod A  Min A  speech intelligibility strategies    Pain   no c/o pain  no c/o pain  Assess pain q shift and prn   Skin   no skin issues   no skin issues  Assess skin q shift and prn      *See Care Plan and progress notes for long and short-term goals.     Barriers to Discharge  Current Status/Progress Possible Resolutions Date Resolved   Physician    Decreased caregiver support;Lack of/limited family support;Behavior     progressing toward goals  Neuropsych eval      Nursing                  PT                    OT                  SLP  SW                Discharge Planning/Teaching Needs:  HOme with son's working on a 24 hr discharge plan. One son doesn't work      Team Discussion:  Goals supervision-mod/i wheelchair level. Lofty goals may need to downgrade goals. Balance issues. Started trazadone for sleep. B-LE-Ataxia. Being seen by neuro-psych for coping. MBS next week. Pt needs to be encouraged to stay out of bed.  Revisions to Treatment Plan:  DC 9/28    Continued Need for Acute Rehabilitation Level of Care: The patient requires daily medical management by a physician with specialized training in physical medicine and rehabilitation for the following conditions: Daily direction of a multidisciplinary physical rehabilitation program to ensure safe treatment  while eliciting the highest outcome that is of practical value to the patient.: Yes Daily medical management of patient stability for increased activity during participation in an intensive rehabilitation regime.: Yes Daily analysis of laboratory values and/or radiology reports with any subsequent need for medication adjustment of medical intervention for : Neurological problems;Mood/behavior problems  Timberlee Roblero, Lemar Livings 04/19/2017, 1:28 PM

## 2017-04-19 NOTE — Progress Notes (Signed)
Social Work Patient ID: Brendan Charles, male   DOB: [birth year], 61 y.o.   MRN: [id number]   Met with pt to discuss team conference goals superivision wheelchair level and PT re-evaluating ambulation goals. Pt does feel he is making progress and is encouraged by this. He is one who is still struggling with having another stroke so close to the first one and how he was progressing and recovering from that one He is trying to stay upbeat and focusing on his progress here. Will work on providing support and plans once they are known.

## 2017-04-19 NOTE — Progress Notes (Signed)
Occupational Therapy Session Note  Patient Details  Name: Brendan Charles MRN: [id number] Date of Birth: [birth year]  Today's Date: 04/19/2017 OT Individual Time: 1304-1400 OT Individual Time Calculation (min): 56 min    Short Term Goals: Week 1:  OT Short Term Goal 1 (Week 1): Pt will use L UE for bathing tasks with Min A OT Short Term Goal 2 (Week 1): Pt will maintain standing for 2 mins in order to complete grooming tasks at the sink.   OT Short Term Goal 3 (Week 1): Pt will maintain midline during bathing tasks while seated with supervision OT Short Term Goal 4 (Week 1): Pt will don shirt with min A  Skilled Therapeutic Interventions/Progress Updates:    Pt reported need for bathroom upon OT arrival. Pt already incontinent of bladder. Discussed importance of calling for assistance when he feels the urge to urinate. Pt completed stand-pivot to raised toilet on R side with Mod A. Pt needed assist for clothing management but was able to complete hygiene with set-up and min guard A for balance with hip hike. Pt voided bladder and had large BM. Worked on standing balance/endurance, trunk extension and L UE strength/coordination using standing frame and graded clothes pins. Sit<>stand Mod A with facilitation to achieve full hip/trunk extension. Pt needed guided A to lift L UE post ~90 2/2 proximal weakness. Graded activity to only include red and green clothes pins which are graded easier. Pt tolerated 8 mins standing at longest bout. 20 mins all together with 3 seated rest breaks. Pt then completed 7 mins on Sci-[employer] arm bike on level 2.5 with 3 rest breaks and intermittent min guard A for balance. Pt returned to room and left seated in wc with safety belt on, needs met and call bell in reach.   Therapy Documentation Precautions:  Precautions Precautions: Fall Precaution Comments: new L hemiparesis; recent CVA with R sided weakness Restrictions Weight Bearing Restrictions: No  See Function  Navigator for Current Functional Status.   Therapy/Group: Individual Therapy  [name] 04/19/2017, 1:18 PM

## 2017-04-19 NOTE — Progress Notes (Signed)
Physical Therapy Session Note  Patient Details  Name: Brendan Charles MRN: [id number] Date of Birth: Sep 12, 1955  Today's Date: 04/19/2017 PT Individual Time: 1445-1556 PT Individual Time Calculation (min): 71 min   Short Term Goals: Week 1:  PT Short Term Goal 1 (Week 1): Pt will perform functional transfers with min assist PT Short Term Goal 2 (Week 1): Pt will be able to gait x 25' with mod assist PT Short Term Goal 3 (Week 1): Pt will be able to perform 4 steps with max assist to prepare for home entry  Skilled Therapeutic Interventions/Progress Updates:    no c/o pain.  Session focus on NMR and activity tolerance.   W/C propulsion x100' with BUEs for coordination, mobility, and activity tolerance.  Pt requires occasional short rest break but overall improved pace from yesterday.    Gait x92' with 3 muskateers, initially min assist to facilitate upright posture and L<>R weight shift, progress to mod assist +2 for posture, pacing, and occasional assist with LE advancement 2/2 fatigue.  Kinetron 3 trials of 10 cycles in sitting at 15 cm/s, plus 3 trials of 10 cycles in standing at 15 cm/s focus on reciprocal stepping, strengthening, coordination, and trunk control.    Supine hip flexor stretch bilateral x2 minutes each.  Attempted seated with posterior wedge for improved anterior pelvic tilt with little success.  Pt education on changing of w/c cushion for better positioning and trial of Lite Gait in the future to progress ambulation.  Pt returned to bed at end of session, continues to require mod assist to lift Les into bed.  Call bell in reach and needs met.    Therapy Documentation Precautions:  Precautions Precautions: Fall Precaution Comments: new L hemiparesis; recent CVA with R sided weakness Restrictions Weight Bearing Restrictions: No   See Function Navigator for Current Functional Status.   Therapy/Group: Individual Therapy  Michel Santee 04/19/2017, 3:58 PM

## 2017-04-19 NOTE — Progress Notes (Signed)
Subjective/Complaints:  Appreciate Neuropsych note  ROS- neg CP, SOB, N/V/D  Objective: Vital Signs: Blood pressure (!) 147/87, pulse 90, temperature 98.3 F (36.8 C), temperature source Oral, resp. rate 17, height '5\' 11"'$  (1.803 m), weight 71.2 kg (157 lb), SpO2 97 %. No results found. No results found for this or any previous visit (from the past 72 hour(s)).   HEENT: normal and dysarthric Cardio: RRR and no murmur Resp: CTA B/L and unlabored GI: BS positive and NT, ND Extremity:  Pulses positive and No Edema Skin:   Intact Neuro: Alert/Oriented, Cranial Nerve Abnormalities Left central VII, Normal Sensory, Abnormal Motor 2- Left delt, Bi, tri, finger flexor/ext, hip/knee extensor synergy, 0/5 at ankle and Dysarthric Musc/Skel:  Other no pain with UE or LE ROM Gen NAD   Assessment/Plan: 1. Functional deficits secondary to acute left hemiparesis, subacute right hemiparesis which require 3+ hours per day of interdisciplinary therapy in a comprehensive inpatient rehab setting. Physiatrist is providing close team supervision and 24 hour management of active medical problems listed below. Physiatrist and rehab team continue to assess barriers to discharge/monitor patient progress toward functional and medical goals. FIM: Function - Bathing Position: Shower Body parts bathed by patient: Right arm, Left arm, Chest, Abdomen, Front perineal area, Left upper leg, Right upper leg Body parts bathed by helper: Buttocks, Right lower leg, Left lower leg, Back Assist Level: Touching or steadying assistance(Pt > 75%)  Function- Upper Body Dressing/Undressing What is the patient wearing?: Pull over shirt/dress Pull over shirt/dress - Perfomed by patient: Thread/unthread right sleeve, Put head through opening Pull over shirt/dress - Perfomed by helper: Thread/unthread left sleeve, Pull shirt over trunk Assist Level: Touching or steadying assistance(Pt > 75%) Function - Lower Body  Dressing/Undressing What is the patient wearing?: Pants, Non-skid slipper socks Position: Wheelchair/chair at sink Pants- Performed by patient: Thread/unthread right pants leg Pants- Performed by helper: Thread/unthread left pants leg, Pull pants up/down Non-skid slipper socks- Performed by helper: Don/doff right sock, Don/doff left sock Assist for footwear: Dependant Assist for lower body dressing: Touching or steadying assistance (Pt > 75%)  Function - Toileting Toileting steps completed by patient: Performs perineal hygiene Toileting steps completed by helper: Adjust clothing prior to toileting, Adjust clothing after toileting Toileting Assistive Devices: Grab bar or rail Assist level: Touching or steadying assistance (Pt.75%)  Function - Air cabin crew transfer assistive device: Grab bar Assist level to toilet: Maximal assist (Pt 25 - 49%/lift and lower) Assist level from toilet: Moderate assist (Pt 50 - 74%/lift or lower)  Function - Chair/bed transfer Chair/bed transfer method: Squat pivot Chair/bed transfer assist level: Maximal assist (Pt 25 - 49%/lift and lower) (best attempts mod assist) Chair/bed transfer assistive device: Armrests Chair/bed transfer details: Manual facilitation for weight shifting, Verbal cues for precautions/safety, Verbal cues for technique  Function - Locomotion: Wheelchair Will patient use wheelchair at discharge?: Yes Type: Manual Max wheelchair distance: 50' Assist Level: Supervision or verbal cues Assist Level: Touching or steadying assistance (Pt > 75%) Wheel 150 feet activity did not occur: Safety/medical concerns Turns around,maneuvers to table,bed, and toilet,negotiates 3% grade,maneuvers on rugs and over doorsills: No Function - Locomotion: Ambulation Assistive device: Rail in hallway Max distance: 30 Assist level: Maximal assist (Pt 25 - 49%) Walk 10 feet activity did not occur: Safety/medical concerns Assist level: Maximal  assist (Pt 25 - 49%) Walk 50 feet with 2 turns activity did not occur: Safety/medical concerns Walk 150 feet activity did not occur: Safety/medical concerns Walk 10 feet on uneven  surfaces activity did not occur: Safety/medical concerns  Function - Comprehension Comprehension: Auditory Comprehension assist level: Understands basic 75 - 89% of the time/ requires cueing 10 - 24% of the time  Function - Expression Expression: Verbal Expression assist level: Expresses basic 50 - 74% of the time/requires cueing 25 - 49% of the time. Needs to repeat parts of sentences.  Function - Social Interaction Social Interaction assist level: Interacts appropriately with others with medication or extra time (anti-anxiety, antidepressant).  Function - Problem Solving Problem solving assist level: Solves complex 90% of the time/cues < 10% of the time  Function - Memory Memory assist level: More than reasonable amount of time Patient normally able to recall (first 3 days only): Current season, Location of own room, Staff names and faces, That he or she is in a hospital  Medical Problem List and Plan: 1. Right hemiparesis as well as left side weakness/dysarthriasecondary to acute lacunar infarction involving the right medulla oblongata and medial aspect. Subacute large vessel left MCA infarct due to intracranial stenosis-dual antiplatelet ASA and Plavix CIR PT OT- Team conference today please see physician documentation under team conference tab, met with team face-to-face to discuss problems,progress, and goals. Formulized individual treatment plan based on medical history, underlying problem and comorbidities. 2. DVT Prophylaxis/Anticoagulation: Subcutaneous Lovenox. Monitor platelet counts of any signs of bleeding 3. Pain Management/chronic back pain: Oxycodone as needed 4. Mood: Xanax 0.5 mg 3 times a day as needed 5. Neuropsych: This patient iscapable of making decisions on hisown behalf. 6.  Skin/Wound Care: Routine skin checks 7. Fluids/Electrolytes/Nutrition: Routine I&O's with follow-up chemistries 8.Hyperlipidemia. Lipitor 9.Tobacco abuse counseling 10.Recent left ureteral stone. Has hematuria potentiated by anticoagulants, last Hgb is 14.8 on 9/7 will repeat  Continue Flomax. Follow-up urology services, has stent placement, scheduled for removal which will be delayed by CVA, uro f/u as OP 11.Hypertension. Norvasc 5 mg daily. Fair control 9/12 Vitals:   04/18/17 0611 04/18/17 1523  BP: (!) 144/91 (!) 147/87  Pulse: 85 90  Resp: 16 17  Temp: 97.7 F (36.5 C) 98.3 F (36.8 C)  SpO2: 99% 97%  12.  Mild leukocytosis, afebrile 13.  Tachycardia- no anemia, volume status looks normal, EKG show sinus rhythm- will monitor  14.  Insomnia - prn trazodone 15.  Pt reports no BM x 2 d, discussed constipation related to immobility   LOS (Days) 6 A FACE TO FACE EVALUATION WAS PERFORMED  Levette Paulick E 04/19/2017, 8:17 AM

## 2017-04-20 ENCOUNTER — Inpatient Hospital Stay (HOSPITAL_COMMUNITY): Payer: Medicare Other | Admitting: Physical Therapy

## 2017-04-20 ENCOUNTER — Inpatient Hospital Stay (HOSPITAL_COMMUNITY): Payer: Medicare Other | Admitting: Occupational Therapy

## 2017-04-20 ENCOUNTER — Inpatient Hospital Stay (HOSPITAL_COMMUNITY): Payer: Medicare Other | Admitting: Speech Pathology

## 2017-04-20 LAB — CREATININE, SERUM
CREATININE: 0.81 mg/dL (ref 0.61–1.24)
GFR calc Af Amer: >60 mL/min (ref >60)

## 2017-04-20 NOTE — Progress Notes (Signed)
Occupational Therapy Session Note  Patient Details  Name: Brendan Charles MRN: [id number] Date of Birth: 1955-09-18  Today's Date: 04/20/2017 OT Individual Time: 0905-1000 OT Individual Time Calculation (min): 55 min    Short Term Goals: Week 1:  OT Short Term Goal 1 (Week 1): Pt will use L UE for bathing tasks with Min A OT Short Term Goal 2 (Week 1): Pt will maintain standing for 2 mins in order to complete grooming tasks at the sink.   OT Short Term Goal 3 (Week 1): Pt will maintain midline during bathing tasks while seated with supervision OT Short Term Goal 4 (Week 1): Pt will don shirt with min A  Skilled Therapeutic Interventions/Progress Updates:    Pt received in chair working on brushing his teeth at the sink. Pt requested to shower.  He worked on [employer] of sit to partial stand using B hands to push up 5x to prepare for transfers to shower.  Pt completed w/c to tub bench with min A as he pushed up and then used grab bars for a smooth complete transfer onto bench.  Pt sits with severe kyphotic posture but can sit upright with cues. Education with pt on how critical his trunk control and posture is to enable him to be more successful with his mobility.  Pt needed min A with sitting balance as he used lateral leans to wash bottom and for forward reach to feet.  Transferred back to chair for dressing. Excellent active use of LUE for washing UB and donning shirt.  Guiding A with pants for safety.  Focused on controlled sit to stand and standing for donning pants over hips. Transitioned to standing at sink for LUE wt bearing and wt shifting over feet to stand in midline. Cues for upright posture and to actively pull scapula back.  Pt tolerated standing 5 minutes.  Pt sat back in chair with quick release belt on and all needs met.   Therapy Documentation Precautions:  Precautions Precautions: Fall Precaution Comments: new L hemiparesis; recent CVA with R sided  weakness Restrictions Weight Bearing Restrictions: No       Pain: Pain Assessment Pain Score: 0-No pain Faces Pain Scale: No hurt ADL: ADL ADL Comments: Please see functional navigator    See Function Navigator for Current Functional Status.   Therapy/Group: Individual Therapy  Bud 04/20/2017, 12:42 PM

## 2017-04-20 NOTE — Progress Notes (Deleted)
Social Work Patient ID: Brendan Charles, male   DOB: Jan 24, 1956, 61 y.o.   MRN: [id number]  Spoke with daughter per telephone to discuss her need to come in and attend therapies with Dad. Pt is much care and do not feel wife will be able to do on her own. Discussed with both option of NH until he is at a higher level and can return home. Wife has health issues of her own and The discharge plan needs to be safe for the both of them. Made aware once goes home and if it is not successful his insurance may not cover for him to go to one. Daughter to be in tomorrow at 3:00 for PT  And any other therapy can involve her in. Wife missed am therapies and thought she was coming, but will talk with then here. Daughter voiced she doesn't come in until the afternoon, due to can't get moving in the Morning.

## 2017-04-20 NOTE — Progress Notes (Signed)
Subjective/Complaints:  Discussed d/c date 9/28  ROS- neg CP, SOB, N/V/D  Objective: Vital Signs: Blood pressure 126/78, pulse 88, temperature 97.9 F (36.6 C), temperature source Oral, resp. rate 18, height  (1.803 m), weight 71.2 kg (157 lb), SpO2 96 %. No results found. No results found for this or any previous visit (from the past 72 hour(s)).   HEENT: normal and dysarthric Cardio: RRR and no murmur Resp: CTA B/L and unlabored GI: BS positive and NT, ND Extremity:  Pulses positive and No Edema Skin:   Intact Neuro: Alert/Oriented, Cranial Nerve Abnormalities Left central VII, Normal Sensory, Abnormal Motor 2- Left delt, Bi, tri, finger flexor/ext, hip/knee extensor synergy, 0/5 at ankle and Dysarthric Musc/Skel:  Other no pain with UE or LE ROM Gen NAD   Assessment/Plan: 1. Functional deficits secondary to acute left hemiparesis, subacute right hemiparesis which require 3+ hours per day of interdisciplinary therapy in a comprehensive inpatient rehab setting. Physiatrist is providing close team supervision and 24 hour management of active medical problems listed below. Physiatrist and rehab team continue to assess barriers to discharge/monitor patient progress toward functional and medical goals. FIM: Function - Bathing Position: Shower Body parts bathed by patient: Right arm, Left arm, Chest, Abdomen, Front perineal area, Left upper leg, Right upper leg Body parts bathed by helper: Buttocks, Right lower leg, Left lower leg, Back Assist Level: Touching or steadying assistance(Pt > 75%)  Function- Upper Body Dressing/Undressing What is the patient wearing?: Pull over shirt/dress Pull over shirt/dress - Perfomed by patient: Thread/unthread right sleeve, Put head through opening Pull over shirt/dress - Perfomed by helper: Thread/unthread left sleeve, Pull shirt over trunk Assist Level: Touching or steadying assistance(Pt > 75%) Function - Lower Body  Dressing/Undressing What is the patient wearing?: Pants, Non-skid slipper socks Position: Wheelchair/chair at sink Pants- Performed by patient: Thread/unthread right pants leg Pants- Performed by helper: Thread/unthread left pants leg, Pull pants up/down Non-skid slipper socks- Performed by helper: Don/doff right sock, Don/doff left sock Assist for footwear: Dependant Assist for lower body dressing: Touching or steadying assistance (Pt > 75%)  Function - Toileting Toileting steps completed by patient: Performs perineal hygiene Toileting steps completed by helper: Adjust clothing prior to toileting, Adjust clothing after toileting Toileting Assistive Devices: Grab bar or rail Assist level: Touching or steadying assistance (Pt.75%)  Function - Archivist transfer assistive device: Grab bar Assist level to toilet: Moderate assist (Pt 50 - 74%/lift or lower) Assist level from toilet: Moderate assist (Pt 50 - 74%/lift or lower)  Function - Chair/bed transfer Chair/bed transfer method: Squat pivot Chair/bed transfer assist level: Moderate assist (Pt 50 - 74%/lift or lower) (best attempt steady assist) Chair/bed transfer assistive device: Armrests, Bedrails Chair/bed transfer details: Verbal cues for precautions/safety, Verbal cues for technique, Tactile cues for weight shifting, Tactile cues for sequencing  Function - Locomotion: Wheelchair Will patient use wheelchair at discharge?: Yes Type: Manual Max wheelchair distance: 100 Assist Level: Supervision or verbal cues Assist Level: Supervision or verbal cues Wheel 150 feet activity did not occur: Safety/medical concerns Turns around,maneuvers to table,bed, and toilet,negotiates 3% grade,maneuvers on rugs and over doorsills: No Function - Locomotion: Ambulation Assistive device: Other (comment) (3 muskateers) Max distance: 92 Assist level: 2 helpers (+2 mod assist) Walk 10 feet activity did not occur: Safety/medical  concerns Assist level: 2 helpers Walk 50 feet with 2 turns activity did not occur: Safety/medical concerns Assist level: 2 helpers Walk 150 feet activity did not occur: Safety/medical concerns Walk 10 feet  on uneven surfaces activity did not occur: Safety/medical concerns  Function - Comprehension Comprehension: Auditory Comprehension assist level: Understands basic 75 - 89% of the time/ requires cueing 10 - 24% of the time  Function - Expression Expression: Verbal Expression assist level: Expresses basic 50 - 74% of the time/requires cueing 25 - 49% of the time. Needs to repeat parts of sentences.  Function - Social Interaction Social Interaction assist level: Interacts appropriately with others with medication or extra time (anti-anxiety, antidepressant).  Function - Problem Solving Problem solving assist level: Solves complex 90% of the time/cues < 10% of the time  Function - Memory Memory assist level: More than reasonable amount of time Patient normally able to recall (first 3 days only): Current season, Location of own room, Staff names and faces, That he or she is in a hospital  Medical Problem List and Plan: 1. Right hemiparesis as well as left side weakness/dysarthriasecondary to acute lacunar infarction involving the right medulla oblongata and medial aspect. Subacute large vessel left MCA infarct due to intracranial stenosis-dual antiplatelet ASA and Plavix CIR PT OT SLP2. DVT Prophylaxis/Anticoagulation: Subcutaneous Lovenox. Monitor platelet counts of any signs of bleeding 3. Pain Management/chronic back pain: Oxycodone as needed 4. Mood: Xanax 0.5 mg 3 times a day as needed 5. Neuropsych: This patient iscapable of making decisions on hisown behalf. 6. Skin/Wound Care: Routine skin checks 7. Fluids/Electrolytes/Nutrition: Routine I&O's with follow-up chemistries 8.Hyperlipidemia. Lipitor 9.Tobacco abuse counseling 10.Recent left ureteral stone. Has hematuria  potentiated by anticoagulants, last Hgb is 14.8 on 9/7 will repeat  Continue Flomax. Follow-up urology services, has stent placement, scheduled for removal which will be delayed by CVA, uro f/u as OP 11.Hypertension. Norvasc 5 mg daily. Fair control 9/12 Vitals:   04/19/17 1407 04/20/17 0500  BP: 136/86 126/78  Pulse: (!) 125 88  Resp: 18 18  Temp: 98 F (36.7 C) 97.9 F (36.6 C)  SpO2: 98% 96%  12.  Mild leukocytosis, afebrile 13.  Tachycardia- no anemia, volume status looks normal, EKG show sinus rhythm- will monitor  14.  Insomnia - prn trazodone 15.  Pt reports no BM x 2 d, discussed constipation related to immobility 16.  Bladder incont with freq at noc no dysuria, likely spastic bladder    LOS (Days) 7 A FACE TO FACE EVALUATION WAS PERFORMED  Alfhild Partch E 04/20/2017, 6:29 AM

## 2017-04-20 NOTE — Progress Notes (Signed)
Speech Language Pathology Weekly Progress and Session Note  Patient Details  Name: Brendan Charles MRN: [id number] Date of Birth: 04-Jan-1956  Beginning of progress report period: April 13, 2017 End of progress report period: April 20, 2017  Today's Date: 04/20/2017 SLP Individual Time: 1100-1200 SLP Individual Time Calculation (min): 60 min  Short Term Goals: Week 1: SLP Short Term Goal 1 (Week 1): Patient will utilize speech intelligibility strategies to maximize speech intelligibility at the phrase level to 75% with Min A verbal cues. SLP Short Term Goal 1 - Progress (Week 1): Not met SLP Short Term Goal 2 (Week 1): Patient will consume current diet with minimal overt s/s of aspiration with supervision verbal cues for use of swallowing compensatory strategies.  SLP Short Term Goal 2 - Progress (Week 1): Met SLP Short Term Goal 3 (Week 1): Patient will perform pharyngeal strengthening exercises with supervision verbal cues for accuracy.  SLP Short Term Goal 3 - Progress (Week 1): Not met SLP Short Term Goal 4 (Week 1): Patient will consume trials of thin liquids with minimal overt s/s of aspiration over 3 sessions to assess readiness for repeat MBS.  SLP Short Term Goal 4 - Progress (Week 1): Not met SLP Short Term Goal 5 (Week 1): Patient will perform 20 repetitions of EMST/IMST excerises with Mod A verbal cues for accuracy and a self-percieved effort level of 8/10.  SLP Short Term Goal 5 - Progress (Week 1): Met SLP Short Term Goal 6 (Week 1): Patient will utilize diaphragmatic breathing at the word level with Mod A verbal cues to ahieve 100% intelligibility.  SLP Short Term Goal 6 - Progress (Week 1): Met    New Short Term Goals: Week 2: SLP Short Term Goal 1 (Week 2): Patient will perform pharyngeal strengthening exercises with supervision verbal cues for accuracy.  SLP Short Term Goal 2 (Week 2): Patient will perform 25 repetitions of EMST/IMST excerises with Mod A verbal  cues for accuracy and a self-percieved effort level of 8/10.  SLP Short Term Goal 3 (Week 2): Patient will consume trials of thin liquids via tsp without overt s/s of aspiration with supervision verbal cues for use of swallowing strategies to assess readiness for repeat MBS.  SLP Short Term Goal 4 (Week 2): Patient will utilize speech intelligibility strategies to maximize speech intelligibility at the phrase level to 75% with Min A verbal cues.  Weekly Progress Updates: Patient has made functional gains and has met 3 of 6 STG's this reporting period. Currently, patient is consuming Dys. 1 textures with nectar-thick liquids without overt s/s of aspiration with intermittent supervision. Patient is also consuming trials of thin liquids via tsp without overt s/s of aspiration, however, when bolus size increases with a cup, patient demonstrates overt s/s of aspiration. Hopeful patient will be ready for repeat MBS next week. Patient demonstrates increased ability to perform IMST/EMST exercises with decreased effort level but continues to require overall Mod A verbal cues for use of speech intelligibility strategies at the phrase level to achieve ~75% intelligibility. Patient and family education is ongoing. Patient would benefit from continued skilled SLP intervention to maximize his swallowing function and functional communication prior to discharge.      Intensity: Minumum of 1-2 x/day, 30 to 90 minutes Frequency: 3 to 5 out of 7 days Duration/Length of Stay: 05/05/17 Treatment/Interventions: English as a second language teacher;Dysphagia/aspiration precaution training;Environmental controls;Functional tasks;Patient/family education;Therapeutic Activities;Speech/Language facilitation;Internal/external aids   Daily Session  Skilled Therapeutic Interventions: Skilled treatment session focused on speech and dysphagia  goals. SLP facilitated session by providing supervision verbal cues for accuracy during EMST/IMST exercises.  Patient performed 20 repetitions with a self-perceived effort level of 8/10. Patient performed oral care via the suction toothbrush and and consumed trials of thin liquids via tsp without overt s/s of aspiration. Recommend continued trials of thin with SLP only. Patient also verbalized at the phrase level during a verbal description task with 50-75% intelligibility and Mod A verbal cues for use of speech intelligibility strategies. Patient left upright in wheelchair with quick release belt in place and all needs within reach. Continue with current plan of care.       Function:   Cognition Comprehension Comprehension assist level: Understands basic 90% of the time/cues < 10% of the time  Expression   Expression assist level: Expresses basic 90% of the time/requires cueing < 10% of the time.  Social Interaction Social Interaction assist level: Interacts appropriately with others with medication or extra time (anti-anxiety, antidepressant).  Problem Solving Problem solving assist level: Solves complex 90% of the time/cues < 10% of the time  Memory Memory assist level: More than reasonable amount of time   Pain Pain Assessment Pain Score: 0-No pain Faces Pain Scale: No hurt  Therapy/Group: Individual Therapy  Victorine Mcnee 04/20/2017, 12:42 PM

## 2017-04-20 NOTE — Progress Notes (Signed)
Physical Therapy Session Note  Patient Details  Name: Brendan Charles MRN: [id number] Date of Birth: 1955/10/08  Today's Date: 04/20/2017 PT Individual Time: 1330-1450 PT Individual Time Calculation (min): 80 min   Short Term Goals: Week 1:  PT Short Term Goal 1 (Week 1): Pt will perform functional transfers with min assist PT Short Term Goal 2 (Week 1): Pt will be able to gait x 25' with mod assist PT Short Term Goal 3 (Week 1): Pt will be able to perform 4 steps with max assist to prepare for home entry  Skilled Therapeutic Interventions/Progress Updates:    no c/o pain.  Session focus on NMR via postural re-education and body weight support treadmill training (BWSTT).    Pt completes 3 trials of BWSTT with Lite Gait and overall max assist for LE advancement and weight shift with mod multimodal cues for the same.  Pt ambulates 62'+72'+87' on treadmill with rest breaks between trials.  Ambulation over ground with +2 HHA, mod/max assist for weight shifting, upright posture, and occasional assist for LE advancement.    Postural re-education while seated on 4" therapy wedge and feet supported while engaged in overhead reaching task.  Pt requires occasional rest break for fatigue.  Switched out pt's cushion for [employer] quatro used for positioning similar to wedged sitting on therapy mat.    Handoff to Stroke Support Group.   Therapy Documentation Precautions:  Precautions Precautions: Fall Precaution Comments: new L hemiparesis; recent CVA with R sided weakness Restrictions Weight Bearing Restrictions: No   See Function Navigator for Current Functional Status.   Therapy/Group: Individual Therapy  Stephania FragminCaitlin E Rachele Lamaster 04/20/2017, 4:32 PM

## 2017-04-20 NOTE — Plan of Care (Signed)
Problem: RH SKIN INTEGRITY Goal: RH STG SKIN FREE OF INFECTION/BREAKDOWN Skin will be free of infection/breakdown with min assistance  Outcome: Progressing No signs of infection or skin breakdown.  Problem: RH SAFETY Goal: RH STG ADHERE TO SAFETY PRECAUTIONS W/ASSISTANCE/DEVICE STG Adhere to Safety Precautions With Min Assistance/Device.   Outcome: Progressing Patient calls and waits for assistance.  Problem: RH PAIN MANAGEMENT Goal: RH STG PAIN MANAGED AT OR BELOW PT'S PAIN GOAL Less than 4  Outcome: Progressing Denies pain.

## 2017-04-21 ENCOUNTER — Inpatient Hospital Stay (HOSPITAL_COMMUNITY): Payer: Medicare Other | Admitting: Occupational Therapy

## 2017-04-21 ENCOUNTER — Inpatient Hospital Stay (HOSPITAL_COMMUNITY): Payer: Medicare Other | Admitting: Speech Pathology

## 2017-04-21 ENCOUNTER — Ambulatory Visit (HOSPITAL_COMMUNITY): Admission: RE | Admit: 2017-04-21 | Payer: Medicare Other | Source: Ambulatory Visit | Admitting: Urology

## 2017-04-21 ENCOUNTER — Encounter (HOSPITAL_COMMUNITY): Admission: RE | Payer: Self-pay | Source: Ambulatory Visit

## 2017-04-21 ENCOUNTER — Inpatient Hospital Stay (HOSPITAL_COMMUNITY): Payer: Medicare Other | Admitting: Physical Therapy

## 2017-04-21 LAB — CBC
HEMATOCRIT: 41.5 % (ref 39.0–52.0)
Hemoglobin: 13.7 g/dL (ref 13.0–17.0)
MCH: 30.4 pg (ref 26.0–34.0)
MCHC: 33 g/dL (ref 30.0–36.0)
MCV: 92.2 fL (ref 78.0–100.0)
PLATELETS: 357 10*3/uL (ref 150–400)
RBC: 4.5 MIL/uL (ref 4.22–5.81)
RDW: 13.8 % (ref 11.5–15.5)
WBC: 10.4 10*3/uL (ref 4.0–10.5)

## 2017-04-21 SURGERY — CYSTOURETEROSCOPY, WITH RETROGRADE PYELOGRAM AND STENT INSERTION
Anesthesia: General | Laterality: Left

## 2017-04-21 NOTE — Progress Notes (Signed)
Speech Language Pathology Daily Session Note  Patient Details  Name: Brendan Charles MRN: [id number] Date of Birth: 07/17/1956  Today's Date: 04/21/2017 SLP Individual Time: 1300-1345 SLP Individual Time Calculation (min): 45 min  Short Term Goals: Week 2: SLP Short Term Goal 1 (Week 2): Patient will perform pharyngeal strengthening exercises with supervision verbal cues for accuracy.  SLP Short Term Goal 2 (Week 2): Patient will perform 25 repetitions of EMST/IMST excerises with Mod A verbal cues for accuracy and a self-percieved effort level of 8/10.  SLP Short Term Goal 3 (Week 2): Patient will consume trials of thin liquids via tsp without overt s/s of aspiration with supervision verbal cues for use of swallowing strategies to assess readiness for repeat MBS.  SLP Short Term Goal 4 (Week 2): Patient will utilize speech intelligibility strategies to maximize speech intelligibility at the phrase level to 75% with Min A verbal cues.  Skilled Therapeutic Interventions: Skilled treatment session focused on speech intelligibility and dysphagia goals. SLP facilitated session by providing skilled observation of pt consuming trials of thin liquids via tsp bolus. Pt with no overt s/s of aspiration. SLP further facilitated session by providing speech communication activity with other pt and therapists. Pt required Min A cues to increase speech intelligibility to ~ 75% at the phrase/sentence level. Pt was left in dayroom, upright in wheelchair with safety belt donned and other therapists present while he waited for PT. Continue per current plan of care.      Function:  Eating Eating   Modified Consistency Diet: Yes Eating Assist Level: More than reasonable amount of time;Set up assist for;Supervision or verbal cues   Eating Set Up Assist For: Opening containers       Cognition Comprehension Comprehension assist level: Understands basic 90% of the time/cues < 10% of the time  Expression    Expression assist level: Expresses basic 90% of the time/requires cueing < 10% of the time.  Social Interaction Social Interaction assist level: Interacts appropriately with others with medication or extra time (anti-anxiety, antidepressant).  Problem Solving Problem solving assist level: Solves complex 90% of the time/cues < 10% of the time  Memory Memory assist level: More than reasonable amount of time    Pain Pain Assessment Pain Assessment: No/denies pain  Therapy/Group: Individual Therapy  Wilbur Labuda 04/21/2017, 2:52 PM

## 2017-04-21 NOTE — Progress Notes (Signed)
Physical Therapy Weekly Progress Note  Patient Details  Name: Brendan Charles MRN: [id number] Date of Birth: 11-26-1955  Beginning of progress report period: April 14, 2017 End of progress report period: April 21, 2017  Today's Date: 04/21/2017 PT Individual Time: 1400-1510 PT Individual Time Calculation (min): 70 min   Patient has met 0 of 3 short term goals.  Pt has made very slow progress this week, and is currently requiring consistent mod assist for squat/pivot transfers and +2 assist for gait.  Pt somewhat self limiting in therapies and requires max encouragement and max multimodal cues with little demo of carry over.    Patient continues to demonstrate the following deficits muscle weakness, decreased cardiorespiratoy endurance, impaired timing and sequencing, abnormal tone, unbalanced muscle activation, motor apraxia, ataxia, decreased coordination and decreased motor planning,  , decreased awareness, decreased problem solving, decreased safety awareness, decreased memory and delayed processing and decreased sitting balance, decreased standing balance, decreased postural control, hemiplegia and decreased balance strategies and therefore will continue to benefit from skilled PT intervention to increase functional independence with mobility.  Patient not progressing toward long term goals.  See goal revision..  Downgraded gait goals and will continue to reassess for appropriateness throughout next reporting period.  Given pt's BLE ataxia may not be safe for home ambulation by d/c and may need to be w/c level   PT Short Term Goals Week 1:  PT Short Term Goal 1 (Week 1): Pt will perform functional transfers with min assist PT Short Term Goal 1 - Progress (Week 1): Partly met PT Short Term Goal 2 (Week 1): Pt will be able to gait x 25' with mod assist PT Short Term Goal 2 - Progress (Week 1): Not met PT Short Term Goal 3 (Week 1): Pt will be able to perform 4 steps with max assist  to prepare for home entry PT Short Term Goal 3 - Progress (Week 1): Not met Week 2:  PT Short Term Goal 1 (Week 2): Pt will transfer with consistent min assist PT Short Term Goal 2 (Week 2): Pt will propel w/c x150' with no rest breaks and supervision PT Short Term Goal 3 (Week 2): Pt will initiate stair training with PT PT Short Term Goal 4 (Week 2): Pt will ambulate 30' with +1 assist  Skilled Therapeutic Interventions/Progress Updates:    no c/o pain but very fatigued.  Session focus on w/c positioning, and NMR for transfers and w/c mobility.    PT adjusted pt's w/c to improve posture and sitting tolerance as well as decrease need for assist with transfers.    Blocked practice squat/pivot to L and R as well as blocked practice scooting R/L for NMR with max multimodal cues for placement, head/hips relationship, and sequencing.  By end of session pt able to complete 1 squat/pivot transfer back to bed with close supervision.    W/C mobility with BUEs for coordination and strengthening with verbal cues for posture.  Pt requires frequent short rest breaks 2/2 fatigue.    Returned to bed at end of session, positioned with call bell in reach and needs met.   Therapy Documentation Precautions:  Precautions Precautions: Fall Precaution Comments: new L hemiparesis; recent CVA with R sided weakness Restrictions Weight Bearing Restrictions: No  See Function Navigator for Current Functional Status.  Therapy/Group: Individual Therapy  Michel Santee 04/21/2017, 3:16 PM

## 2017-04-21 NOTE — Progress Notes (Signed)
Occupational Therapy Weekly Progress Note  Patient Details  Name: Brendan Charles MRN: [id number] Date of Birth: 11-03-1955  Beginning of progress report period: April 14, 2017 End of progress report period: April 21, 2017  Today's Date: 04/21/2017 OT Individual Time: 0835-1000 OT Individual Time Calculation (min): 85 min    Patient has met 4 of 4 short term goals.  Pt has made considerable progress this first week. He can now complete sit to stands to sink with min A and then needs mod A to balance to support his LLE in standing.  His transfers to his L have improved to a min A and to the R a mod A.  Improved sitting balance in shower and excellent progress with his LUE as he is now using it as an active A. He can now don a shirt with S and LB clothing with min-mod A. His cognition is also improving in that he has not been impulsive during the sessions and waits for cues.  He does continue to need cues with problem solving during his basic self care.  Patient continues to demonstrate the following deficits: muscle weakness, decreased cardiorespiratoy endurance, unbalanced muscle activation and decreased coordination and decreased sitting balance, decreased standing balance, decreased postural control and decreased balance strategies and therefore will continue to benefit from skilled OT intervention to enhance overall performance with BADL and iADL.  Patient progressing toward long term goals..  Continue plan of care.  OT Short Term Goals Week 1:  OT Short Term Goal 1 (Week 1): Pt will use L UE for bathing tasks with Min A OT Short Term Goal 1 - Progress (Week 1): Met OT Short Term Goal 2 (Week 1): Pt will maintain standing for 2 mins in order to complete grooming tasks at the sink.   OT Short Term Goal 2 - Progress (Week 1): Met OT Short Term Goal 3 (Week 1): Pt will maintain midline during bathing tasks while seated with supervision OT Short Term Goal 3 - Progress (Week 1):  Met OT Short Term Goal 4 (Week 1): Pt will don shirt with min A OT Short Term Goal 4 - Progress (Week 1): Met Week 2:  OT Short Term Goal 1 (Week 2): Pt will complete toilet transfers with S. OT Short Term Goal 2 (Week 2): Pt will complete 3/3 toileting tasks with S. OT Short Term Goal 3 (Week 2): Pt will be able to stand at sink with S to perform grooming tasks. OT Short Term Goal 4 (Week 2): Pt will use AE for bathing LB to increase safety with balance. OT Short Term Goal 5 (Week 2): Pt will use AE for donning socks and pants using his LUE as an active assist.  Skilled Therapeutic Interventions/Progress Updates:    Pt seen for ADL retraining with a focus on safe, symmetrical transfers, sit to stands and standing.  Pt completed toileting, shower, dressing, standing at sink for oral care, and then followed water protocol so he could have a few sips of water.  Good attention to task, but does get frustrated and discouraged at times as he is having difficulty with problem solving other solutions.  His LUE is now functioning well and he actively uses it in all bimanual tasks. Due to back pain and weakness from lumbar surgery in 2015, pt may benefit from AE for bathing and LB dressing.  Will trial on next self care session.  Pt resting in room with quick release belt on and all needs  met.  Therapy Documentation Precautions:  Precautions Precautions: Fall Precaution Comments: new L hemiparesis; recent CVA with R sided weakness Restrictions Weight Bearing Restrictions: No    Pain: Pain Assessment Pain Assessment: No/denies pain ADL: ADL ADL Comments: Please see functional navigator   See Function Navigator for Current Functional Status.   Therapy/Group: Individual Therapy  [place] 04/21/2017, 12:45 PM

## 2017-04-21 NOTE — Progress Notes (Signed)
Subjective/Complaints: No issues overnite, states he became emotional in a group activity when discussing CVA, he discussed having 2 strokes on opposite sides of the brain  ROS- neg CP, SOB, N/V/D  Objective: Vital Signs: Blood pressure 127/75, pulse 84, temperature 97.7 F (36.5 C), temperature source Oral, resp. rate 16, height _0  (1.803 m), weight 71.2 kg (157 lb), SpO2 98 %. No results found. Results for orders placed or performed during the hospital encounter of 04/13/17 (from the past 72 hour(s))  Creatinine, serum     Status: None   Collection Time: 04/20/17  7:42 AM  Result Value Ref Range   Creatinine, Ser 0.81 0.61 - 1.24 mg/dL   GFR calc non Af Amer >60 >60 mL/min   GFR calc Af Amer >60 >60 mL/min    Comment: (NOTE) The eGFR has been calculated using the CKD EPI equation. This calculation has not been validated in all clinical situations. eGFR's persistently <60 mL/min signify possible Chronic Kidney Disease.      HEENT: normal and dysarthric Cardio: RRR and no murmur Resp: CTA B/L and unlabored GI: BS positive and NT, ND Extremity:  Pulses positive and No Edema Skin:   Intact Neuro: Alert/Oriented, Cranial Nerve Abnormalities Left central VII, Normal Sensory, Abnormal Motor 2- Left delt, Bi, tri, finger flexor/ext, hip/knee extensor synergy, 0/5 at ankle and Dysarthric Musc/Skel:  Other no pain with UE or LE ROM Gen NAD   Assessment/Plan: 1. Functional deficits secondary to acute left hemiparesis, subacute right hemiparesis which require 3+ hours per day of interdisciplinary therapy in a comprehensive inpatient rehab setting. Physiatrist is providing close team supervision and 24 hour management of active medical problems listed below. Physiatrist and rehab team continue to assess barriers to discharge/monitor patient progress toward functional and medical goals. FIM: Function - Bathing Position: Shower Body parts bathed by patient: Right arm, Left arm,  Chest, Abdomen, Front perineal area, Left upper leg, Right upper leg, Buttocks, Right lower leg, Left lower leg Body parts bathed by helper: Back Assist Level: Touching or steadying assistance(Pt > 75%)  Function- Upper Body Dressing/Undressing What is the patient wearing?: Pull over shirt/dress Pull over shirt/dress - Perfomed by patient: Thread/unthread right sleeve, Put head through opening, Thread/unthread left sleeve, Pull shirt over trunk Pull over shirt/dress - Perfomed by helper: Thread/unthread left sleeve, Pull shirt over trunk Assist Level: Touching or steadying assistance(Pt > 75%) Function - Lower Body Dressing/Undressing What is the patient wearing?: Pants, Non-skid slipper socks, Shoes Position: Wheelchair/chair at sink Pants- Performed by patient: Thread/unthread right pants leg, Thread/unthread left pants leg, Pull pants up/down Pants- Performed by helper: Thread/unthread left pants leg, Pull pants up/down Non-skid slipper socks- Performed by helper: Don/doff right sock, Don/doff left sock Shoes - Performed by helper: Don/doff right shoe, Don/doff left shoe Assist for footwear: Supervision/touching assist Assist for lower body dressing: Touching or steadying assistance (Pt > 75%)  Function - Toileting Toileting activity did not occur: N/A Toileting steps completed by patient: Performs perineal hygiene Toileting steps completed by helper: Adjust clothing prior to toileting, Adjust clothing after toileting Toileting Assistive Devices: Grab bar or rail Assist level: Two helpers  Function - Air cabin crew transfer assistive device: Grab bar Assist level to toilet: Moderate assist (Pt 50 - 74%/lift or lower) Assist level from toilet: Moderate assist (Pt 50 - 74%/lift or lower)  Function - Chair/bed transfer Chair/bed transfer method: Squat pivot Chair/bed transfer assist level: Moderate assist (Pt 50 - 74%/lift or lower) (best attempt steady assist) Chair/bed  transfer assistive device: Armrests, Bedrails Chair/bed transfer details: Verbal cues for precautions/safety, Verbal cues for technique, Tactile cues for weight shifting, Tactile cues for sequencing  Function - Locomotion: Wheelchair Will patient use wheelchair at discharge?: Yes Type: Manual Max wheelchair distance: 100 Assist Level: Supervision or verbal cues Assist Level: Supervision or verbal cues Wheel 150 feet activity did not occur: Safety/medical concerns Turns around,maneuvers to table,bed, and toilet,negotiates 3% grade,maneuvers on rugs and over doorsills: No Function - Locomotion: Ambulation Assistive device: Other (comment) (3 muskateers) Max distance: 92 Assist level: 2 helpers (+2 mod assist) Walk 10 feet activity did not occur: Safety/medical concerns Assist level: 2 helpers Walk 50 feet with 2 turns activity did not occur: Safety/medical concerns Assist level: 2 helpers Walk 150 feet activity did not occur: Safety/medical concerns Walk 10 feet on uneven surfaces activity did not occur: Safety/medical concerns  Function - Comprehension Comprehension: Auditory Comprehension assist level: Understands basic 90% of the time/cues < 10% of the time  Function - Expression Expression: Verbal Expression assist level: Expresses basic 90% of the time/requires cueing < 10% of the time.  Function - Social Interaction Social Interaction assist level: Interacts appropriately with others with medication or extra time (anti-anxiety, antidepressant).  Function - Problem Solving Problem solving assist level: Solves complex 90% of the time/cues < 10% of the time  Function - Memory Memory assist level: More than reasonable amount of time Patient normally able to recall (first 3 days only): Current season, Location of own room, Staff names and faces, That he or she is in a hospital  Medical Problem List and Plan: 1. Right hemiparesis as well as left side  weakness/dysarthriasecondary to acute lacunar infarction involving the right medulla oblongata and medial aspect. Subacute large vessel left MCA infarct due to intracranial stenosis-dual antiplatelet ASA and Plavix CIR PT OT SLP2. DVT Prophylaxis/Anticoagulation: Subcutaneous Lovenox. Monitor platelet counts of any signs of bleeding 3. Pain Management/chronic back pain: Oxycodone as needed 4. Mood: Xanax 0.5 mg 3 times a day as needed 5. Neuropsych: This patient iscapable of making decisions on hisown behalf. 6. Skin/Wound Care: Routine skin checks 7. Fluids/Electrolytes/Nutrition: Routine I&O's with follow-up chemistries 8.Hyperlipidemia. Lipitor 9.Tobacco abuse counseling 10.Recent left ureteral stone. Has hematuria potentiated by anticoagulants, last Hgb is 14.8 on 9/7 will repeat 9/14 Continue Flomax. Follow-up urology services, has stent placement, scheduled for removal which will be delayed by CVA, uro f/u as OP 11.Hypertension. Norvasc 5 mg daily. good control 9/14 Vitals:   04/20/17 1500 04/21/17 0500  BP: 136/87 127/75  Pulse: 99 84  Resp:  16  Temp: 98.3 F (36.8 C) 97.7 F (36.5 C)  SpO2: 99% 98%  12.  Mild leukocytosis, afebrile 13.  Tachycardia- no anemia, volume status looks normal, EKG show sinus rhythm- will monitor  14.  Insomnia - prn trazodone 15.  Pt reports no BM x 2 d, discussed constipation related to immobility 16.  Bladder incont with freq at noc no dysuria, likely spastic bladder  17.  Emotional lability vs adjustment d/o f/u neuropsych, pt does not want med management of this   LOS (Days) 8 A FACE TO FACE EVALUATION WAS PERFORMED  Kaysey Berndt E 04/21/2017, 7:09 AM

## 2017-04-22 ENCOUNTER — Inpatient Hospital Stay (HOSPITAL_COMMUNITY): Payer: Medicare Other

## 2017-04-22 NOTE — Progress Notes (Signed)
Occupational Therapy Session Note  Patient Details  Name: Brendan Charles MRN: [id number] Date of Birth: 18-Sep-1955  Today's Date: 04/22/2017 OT Individual Time: 1445-1515 OT Individual Time Calculation (min): 30 min    Short Term Goals: Week 2:  OT Short Term Goal 1 (Week 2): Pt will complete toilet transfers with S. OT Short Term Goal 2 (Week 2): Pt will complete 3/3 toileting tasks with S. OT Short Term Goal 3 (Week 2): Pt will be able to stand at sink with S to perform grooming tasks. OT Short Term Goal 4 (Week 2): Pt will use AE for bathing LB to increase safety with balance. OT Short Term Goal 5 (Week 2): Pt will use AE for donning socks and pants using his LUE as an active assist.  Skilled Therapeutic Interventions/Progress Updates:    1;1. Pt with no c/o pain this session. Pt squat pivot transfer EOB<>w/c with MOD A for lifting and Vc for hand placement throughout transfer. Pt with posterior pelvic tilt while sitting in w/c despite cueing and A to adjust hips. In tx gym, OT demonstrates use of reacher/sock aide/shoe funnel to doff/don socks and shoes. Pt return demonstrates AE use to doff/don socks with supervision and VC for technique. Pt reports family will bring shoes to practice donning with AE. Pt propels w/c back to room with supervision. Exited session with pt seated in bed with call light in reach, bed exit alarm on and all needs met.   Therapy Documentation Precautions:  Precautions Precautions: Fall Precaution Comments: new L hemiparesis; recent CVA with R sided weakness Restrictions Weight Bearing Restrictions: No Other Treatments:    See Function Navigator for Current Functional Status.   Therapy/Group: Individual Therapy  Tonny Branch 04/22/2017, 6:10 PM

## 2017-04-22 NOTE — Progress Notes (Signed)
Subjective/Complaints:  No issues overnite, slept ok  ROS- neg CP, SOB, N/V/D  Objective: Vital Signs: Blood pressure 140/74, pulse 98, temperature 98 F (36.7 C), temperature source Oral, resp. rate 16, height '5\' 11"'$  (1.803 m), weight 71.2 kg (157 lb), SpO2 99 %. No results found. Results for orders placed or performed during the hospital encounter of 04/13/17 (from the past 72 hour(s))  Creatinine, serum     Status: None   Collection Time: 04/20/17  7:42 AM  Result Value Ref Range   Creatinine, Ser 0.81 0.61 - 1.24 mg/dL   GFR calc non Af Amer >60 >60 mL/min   GFR calc Af Amer >60 >60 mL/min    Comment: (NOTE) The eGFR has been calculated using the CKD EPI equation. This calculation has not been validated in all clinical situations. eGFR's persistently <60 mL/min signify possible Chronic Kidney Disease.   CBC     Status: None   Collection Time: 04/21/17  7:59 AM  Result Value Ref Range   WBC 10.4 4.0 - 10.5 K/uL   RBC 4.50 4.22 - 5.81 MIL/uL   Hemoglobin 13.7 13.0 - 17.0 g/dL   HCT 41.5 39.0 - 52.0 %   MCV 92.2 78.0 - 100.0 fL   MCH 30.4 26.0 - 34.0 pg   MCHC 33.0 30.0 - 36.0 g/dL   RDW 13.8 11.5 - 15.5 %   Platelets 357 150 - 400 K/uL     HEENT: normal and dysarthric Cardio: RRR and no murmur Resp: CTA B/L and unlabored GI: BS positive and NT, ND Extremity:  Pulses positive and No Edema Skin:   Intact Neuro: Alert/Oriented, Cranial Nerve Abnormalities Left central VII, Normal Sensory, Abnormal Motor 2- Left delt, Bi, tri, finger flexor/ext, hip/knee extensor synergy, 0/5 at ankle and Dysarthric Musc/Skel:  Other no pain with UE or LE ROM Gen NAD   Assessment/Plan: 1. Functional deficits secondary to acute left hemiparesis, subacute right hemiparesis which require 3+ hours per day of interdisciplinary therapy in a comprehensive inpatient rehab setting. Physiatrist is providing close team supervision and 24 hour management of active medical problems listed  below. Physiatrist and rehab team continue to assess barriers to discharge/monitor patient progress toward functional and medical goals. FIM: Function - Bathing Position: Shower Body parts bathed by patient: Right arm, Left arm, Chest, Abdomen, Front perineal area, Left upper leg, Right upper leg, Buttocks, Right lower leg, Left lower leg Body parts bathed by helper: Back Assist Level: Touching or steadying assistance(Pt > 75%)  Function- Upper Body Dressing/Undressing What is the patient wearing?: Pull over shirt/dress Pull over shirt/dress - Perfomed by patient: Thread/unthread right sleeve, Put head through opening, Thread/unthread left sleeve, Pull shirt over trunk Pull over shirt/dress - Perfomed by helper: Thread/unthread left sleeve, Pull shirt over trunk Assist Level: Set up Function - Lower Body Dressing/Undressing What is the patient wearing?: Pants, Non-skid slipper socks Position: Wheelchair/chair at sink Pants- Performed by patient: Thread/unthread right pants leg, Thread/unthread left pants leg, Pull pants up/down Pants- Performed by helper: Thread/unthread left pants leg, Pull pants up/down Non-skid slipper socks- Performed by helper: Don/doff right sock, Don/doff left sock Shoes - Performed by helper: Don/doff right shoe, Don/doff left shoe Assist for footwear: Supervision/touching assist Assist for lower body dressing: Touching or steadying assistance (Pt > 75%)  Function - Toileting Toileting activity did not occur: N/A Toileting steps completed by patient: Performs perineal hygiene Toileting steps completed by helper: Adjust clothing after toileting Toileting Assistive Devices: Grab bar or rail Assist level:  Touching or steadying assistance (Pt.75%)  Function - Toilet Transfers Toilet transfer assistive device: Grab bar Assist level to toilet: Touching or steadying assistance (Pt > 75%) Assist level from toilet: Touching or steadying assistance (Pt >  75%)  Function - Chair/bed transfer Chair/bed transfer method: Squat pivot Chair/bed transfer assist level: Moderate assist (Pt 50 - 74%/lift or lower) (best attempt steady assist) Chair/bed transfer assistive device: Armrests, Bedrails Chair/bed transfer details: Verbal cues for precautions/safety, Verbal cues for technique, Tactile cues for weight shifting, Tactile cues for sequencing  Function - Locomotion: Wheelchair Will patient use wheelchair at discharge?: Yes Type: Manual Max wheelchair distance: 100 Assist Level: Supervision or verbal cues Assist Level: Supervision or verbal cues Wheel 150 feet activity did not occur: Safety/medical concerns Turns around,maneuvers to table,bed, and toilet,negotiates 3% grade,maneuvers on rugs and over doorsills: No Function - Locomotion: Ambulation Assistive device: Other (comment) (3 muskateers) Max distance: 92 Assist level: 2 helpers (+2 mod assist) Walk 10 feet activity did not occur: Safety/medical concerns Assist level: 2 helpers Walk 50 feet with 2 turns activity did not occur: Safety/medical concerns Assist level: 2 helpers Walk 150 feet activity did not occur: Safety/medical concerns Walk 10 feet on uneven surfaces activity did not occur: Safety/medical concerns  Function - Comprehension Comprehension: Auditory Comprehension assist level: Understands basic 90% of the time/cues < 10% of the time  Function - Expression Expression: Verbal Expression assist level: Expresses basic 90% of the time/requires cueing < 10% of the time.  Function - Social Interaction Social Interaction assist level: Interacts appropriately with others with medication or extra time (anti-anxiety, antidepressant).  Function - Problem Solving Problem solving assist level: Solves basic 90% of the time/requires cueing < 10% of the time  Function - Memory Memory assist level: More than reasonable amount of time Patient normally able to recall (first 3 days  only): Current season, Location of own room, Staff names and faces, That he or she is in a hospital  Medical Problem List and Plan: 1. Right hemiparesis as well as left side weakness/dysarthriasecondary to acute lacunar infarction involving the right medulla oblongata and medial aspect. Subacute large vessel left MCA infarct due to intracranial stenosis-dual antiplatelet ASA and Plavix CIR PT OT SLP        2. DVT Prophylaxis/Anticoagulation: Subcutaneous Lovenox. Monitor platelet counts of any signs of bleeding 3. Pain Management/chronic back pain: Oxycodone as needed 4. Mood: Xanax 0.5 mg 3 times a day as needed 5. Neuropsych: This patient iscapable of making decisions on hisown behalf. 6. Skin/Wound Care: Routine skin checks 7. Fluids/Electrolytes/Nutrition: Routine I&O's with follow-up chemistries 8.Hyperlipidemia. Lipitor 9.Tobacco abuse counseling 10.Recent left ureteral stone. Has hematuria potentiated by anticoagulants, last Hgb is 14.8 on 9/7 will repeat 9/14 13.7  Continue Flomax. Follow-up urology services, has stent placement, scheduled for removal which will be delayed by CVA, uro f/u as OP 11.Hypertension. Norvasc 5 mg daily. good control 9/15 Vitals:   04/21/17 1548 04/22/17 0500  BP: (!) 142/80 140/74  Pulse: 98 98  Resp: 18 16  Temp: 98.6 F (37 C) 98 F (36.7 C)  SpO2: 100% 99%  12.  Mild leukocytosis, afebrile 13.  Tachycardia- no anemia, volume status looks normal, EKG show sinus rhythm- will monitor  14.  Insomnia - prn trazodone 15.  Pt reports no BM x 2 d, discussed constipation related to immobility 16.  Bladder incont with freq at noc no dysuria, likely spastic bladder  17.  Emotional lability vs adjustment d/o f/u neuropsych, pt does  not want med management of this   LOS (Days) 9 A FACE TO FACE EVALUATION WAS PERFORMED  Kiegan Macaraeg E 04/22/2017, 8:21 AM

## 2017-04-23 ENCOUNTER — Inpatient Hospital Stay (HOSPITAL_COMMUNITY): Payer: Medicare Other

## 2017-04-23 MED ORDER — CALCIUM CARBONATE ANTACID 500 MG PO CHEW
1.0000 | CHEWABLE_TABLET | Freq: Two times a day (BID) | ORAL | Status: DC | PRN
  Administered 2017-04-23 – 2017-05-10 (×16): 200 mg via ORAL
  Filled 2017-04-23 (×20): qty 1

## 2017-04-23 NOTE — Progress Notes (Signed)
Occupational Therapy Session Note  Patient Details  Name: Brendan Charles MRN: [id number] Date of Birth: 13-Oct-1955  Today's Date: 04/23/2017 OT Individual Time: 2863-8177 OT Individual Time Calculation (min): 57 min    Short Term Goals: Week 2:  OT Short Term Goal 1 (Week 2): Pt will complete toilet transfers with S. OT Short Term Goal 2 (Week 2): Pt will complete 3/3 toileting tasks with S. OT Short Term Goal 3 (Week 2): Pt will be able to stand at sink with S to perform grooming tasks. OT Short Term Goal 4 (Week 2): Pt will use AE for bathing LB to increase safety with balance. OT Short Term Goal 5 (Week 2): Pt will use AE for donning socks and pants using his LUE as an active assist.  Skilled Therapeutic Interventions/Progress Updates:    1:1. Pt stand pivot transfer min A EOB>w/c<>BSC<>TTB with Vc for hand placement and weight shifting. Pt voids bowel seated on toilet with MIN A for balance during clothing mangement. Pt bathes seated using LHSS to wash BLE and back with instrucitonal cues for technique from OT. Pt dresses at sit to stand level with min A for balnce while advancing pants past hips. Pt threads BLE into pants using reacher with VC for technique. Pt dons socks with sock aide with set up recalling technique taught yesterday. Pt stands at sink to apply deodorant with MIN A for balance and VC for weight shfiting and posture. Exited session with pt seated semi reclined in bed with call light in reach and all needs met  Therapy Documentation Precautions:  Precautions Precautions: Fall Precaution Comments: new L hemiparesis; recent CVA with R sided weakness Restrictions Weight Bearing Restrictions: No General:   Vital Signs: Therapy Vitals Temp: 97.7 F (36.5 C) Temp Source: Oral Pulse Rate: 87 Resp: 16 BP: 126/75 Patient Position (if appropriate): Lying Oxygen Therapy SpO2: 98 % O2 Device: Not Delivered  See Function Navigator for Current Functional  Status.   Therapy/Group: Individual Therapy  Tonny Branch 04/23/2017, 8:53 AM

## 2017-04-23 NOTE — Progress Notes (Signed)
Subjective/Complaints:  No issues overnite  ROS- neg CP, SOB, N/V/D  Objective: Vital Signs: Blood pressure 126/75, pulse 87, temperature 97.7 F (36.5 C), temperature source Oral, resp. rate 16, height  (1.803 m), weight 71.2 kg (157 lb), SpO2 98 %. No results found. Results for orders placed or performed during the hospital encounter of 04/13/17 (from the past 72 hour(s))  CBC     Status: None   Collection Time: 04/21/17  7:59 AM  Result Value Ref Range   WBC 10.4 4.0 - 10.5 K/uL   RBC 4.50 4.22 - 5.81 MIL/uL   Hemoglobin 13.7 13.0 - 17.0 g/dL   HCT 29.5 62.1 - 30.8 %   MCV 92.2 78.0 - 100.0 fL   MCH 30.4 26.0 - 34.0 pg   MCHC 33.0 30.0 - 36.0 g/dL   RDW 65.7 84.6 - 96.2 %   Platelets 357 150 - 400 K/uL     HEENT: normal and dysarthric Cardio: RRR and no murmur Resp: CTA B/L and unlabored GI: BS positive and NT, ND Extremity:  Pulses positive and No Edema Skin:   Intact Neuro: Alert/Oriented, Cranial Nerve Abnormalities Left central VII, Normal Sensory, Abnormal Motor 2- Left delt, Bi, tri, finger flexor/ext, hip/knee extensor synergy, 0/5 at ankle and Dysarthric Musc/Skel:  Other no pain with UE or LE ROM Gen NAD   Assessment/Plan: 1. Functional deficits secondary to acute left hemiparesis, subacute right hemiparesis which require 3+ hours per day of interdisciplinary therapy in a comprehensive inpatient rehab setting. Physiatrist is providing close team supervision and 24 hour management of active medical problems listed below. Physiatrist and rehab team continue to assess barriers to discharge/monitor patient progress toward functional and medical goals. FIM: Function - Bathing Position: Shower Body parts bathed by patient: Right arm, Left arm, Chest, Abdomen, Front perineal area, Left upper leg, Right upper leg, Buttocks, Right lower leg, Left lower leg Body parts bathed by helper: Back Assist Level: Touching or steadying assistance(Pt > 75%)  Function-  Upper Body Dressing/Undressing What is the patient wearing?: Pull over shirt/dress Pull over shirt/dress - Perfomed by patient: Thread/unthread right sleeve, Put head through opening, Thread/unthread left sleeve, Pull shirt over trunk Pull over shirt/dress - Perfomed by helper: Thread/unthread left sleeve, Pull shirt over trunk Assist Level: Set up Function - Lower Body Dressing/Undressing What is the patient wearing?: Pants, Non-skid slipper socks Position: Wheelchair/chair at sink Pants- Performed by patient: Thread/unthread right pants leg, Thread/unthread left pants leg, Pull pants up/down Pants- Performed by helper: Thread/unthread left pants leg, Pull pants up/down Non-skid slipper socks- Performed by helper: Don/doff right sock, Don/doff left sock Shoes - Performed by helper: Don/doff right shoe, Don/doff left shoe Assist for footwear: Supervision/touching assist Assist for lower body dressing: Touching or steadying assistance (Pt > 75%)  Function - Toileting Toileting activity did not occur: N/A Toileting steps completed by patient: Performs perineal hygiene Toileting steps completed by helper: Adjust clothing after toileting Toileting Assistive Devices: Grab bar or rail Assist level: Touching or steadying assistance (Pt.75%)  Function - Archivist transfer assistive device: Grab bar Assist level to toilet: Touching or steadying assistance (Pt > 75%) Assist level from toilet: Touching or steadying assistance (Pt > 75%)  Function - Chair/bed transfer Chair/bed transfer method: Squat pivot Chair/bed transfer assist level: Moderate assist (Pt 50 - 74%/lift or lower) (best attempt steady assist) Chair/bed transfer assistive device: Armrests, Bedrails Chair/bed transfer details: Verbal cues for precautions/safety, Verbal cues for technique, Tactile cues for weight shifting, Tactile  cues for sequencing  Function - Locomotion: Wheelchair Will patient use wheelchair at  discharge?: Yes Type: Manual Max wheelchair distance: 100 Assist Level: Supervision or verbal cues Assist Level: Supervision or verbal cues Wheel 150 feet activity did not occur: Safety/medical concerns Turns around,maneuvers to table,bed, and toilet,negotiates 3% grade,maneuvers on rugs and over doorsills: No Function - Locomotion: Ambulation Assistive device: Other (comment) (3 muskateers) Max distance: 92 Assist level: 2 helpers (+2 mod assist) Walk 10 feet activity did not occur: Safety/medical concerns Assist level: 2 helpers Walk 50 feet with 2 turns activity did not occur: Safety/medical concerns Assist level: 2 helpers Walk 150 feet activity did not occur: Safety/medical concerns Walk 10 feet on uneven surfaces activity did not occur: Safety/medical concerns  Function - Comprehension Comprehension: Auditory Comprehension assist level: Follows basic conversation/direction with no assist  Function - Expression Expression: Verbal Expression assist level: Expresses basic 90% of the time/requires cueing < 10% of the time.  Function - Social Interaction Social Interaction assist level: Interacts appropriately 90% of the time - Needs monitoring or encouragement for participation or interaction.  Function - Problem Solving Problem solving assist level: Solves basic 90% of the time/requires cueing < 10% of the time  Function - Memory Memory assist level: More than reasonable amount of time Patient normally able to recall (first 3 days only): Current season, Location of own room, Staff names and faces, That he or she is in a hospital  Medical Problem List and Plan: 1. Right hemiparesis as well as left side weakness/dysarthriasecondary to acute lacunar infarction involving the right medulla oblongata and medial aspect. Subacute large vessel left MCA infarct due to intracranial stenosis-dual antiplatelet ASA and Plavix CIR PT OT SLP        2. DVT Prophylaxis/Anticoagulation:  Subcutaneous Lovenox. Monitor platelet counts of any signs of bleeding 3. Pain Management/chronic back pain: Oxycodone as needed 4. Mood: Xanax 0.5 mg 3 times a day as needed 5. Neuropsych: This patient iscapable of making decisions on hisown behalf. 6. Skin/Wound Care: Routine skin checks 7. Fluids/Electrolytes/Nutrition: Routine I&O's with follow-up chemistries 8.Hyperlipidemia. Lipitor 9.Tobacco abuse counseling 10.Recent left ureteral stone. Has hematuria potentiated by anticoagulants, last Hgb is 14.8 on 9/7 will repeat 9/14 13.7  Continue Flomax. Follow-up urology services, has stent placement, scheduled for removal which will be delayed by CVA, uro f/u as OP 11.Hypertension. Norvasc 5 mg daily. good control 9/16 Vitals:   04/22/17 1412 04/23/17 0500  BP: 131/79 126/75  Pulse: 98 87  Resp: 18 16  Temp: 98.7 F (37.1 C) 97.7 F (36.5 C)  SpO2: 99% 98%  12.  Mild leukocytosis, afebrile 13.  Tachycardia- no anemia, volume status looks normal, EKG show sinus rhythm- will monitor  14.  Insomnia - prn trazodone 15.  Pt reports no BM x 2 d, discussed constipation related to immobility 16.  Bladder incont with freq at noc no dysuria, likely spastic bladder  17.  Emotional lability vs adjustment d/o f/u neuropsych, pt does not want med management of this, no further labile episodes noted   LOS (Days) 10 A FACE TO FACE EVALUATION WAS PERFORMED  KIRSTEINS,ANDREW E 04/23/2017, 8:02 AM

## 2017-04-24 ENCOUNTER — Inpatient Hospital Stay (HOSPITAL_COMMUNITY): Payer: Medicare Other | Admitting: Physical Therapy

## 2017-04-24 ENCOUNTER — Inpatient Hospital Stay (HOSPITAL_COMMUNITY): Payer: Medicare Other | Admitting: Occupational Therapy

## 2017-04-24 ENCOUNTER — Inpatient Hospital Stay (HOSPITAL_COMMUNITY): Payer: Medicare Other | Admitting: Speech Pathology

## 2017-04-24 MED ORDER — MIRTAZAPINE 15 MG PO TBDP
15.0000 mg | ORAL_TABLET | Freq: Every day | ORAL | Status: DC
  Administered 2017-04-24 – 2017-05-11 (×18): 15 mg via ORAL
  Filled 2017-04-24 (×19): qty 1

## 2017-04-24 NOTE — Progress Notes (Signed)
Speech Language Pathology Daily Session Note  Patient Details  Name: Brendan Charles MRN: [id number] Date of Birth: 06/05/1956  Today's Date: 04/24/2017 SLP Individual Time: 4098-1191 SLP Individual Time Calculation (min): 60 min  Short Term Goals: Week 2: SLP Short Term Goal 1 (Week 2): Patient will perform pharyngeal strengthening exercises with supervision verbal cues for accuracy.  SLP Short Term Goal 2 (Week 2): Patient will perform 25 repetitions of EMST/IMST excerises with Mod A verbal cues for accuracy and a self-percieved effort level of 8/10.  SLP Short Term Goal 3 (Week 2): Patient will consume trials of thin liquids via tsp without overt s/s of aspiration with supervision verbal cues for use of swallowing strategies to assess readiness for repeat MBS.  SLP Short Term Goal 4 (Week 2): Patient will utilize speech intelligibility strategies to maximize speech intelligibility at the phrase level to 75% with Min A verbal cues.  Skilled Therapeutic Interventions: Skilled treatment session focused on speech and dysphagia goals. SLP facilitated session by re-administering RMT. Patient demonstrated meaningful weakness in both his peak MIP and MEP. Patient's peak MIP was 47 cm H2O with the lower limit of normal at 52 cm H2O. Patient's peak MEP was 38 cm H2O with the lower limit of normal at 62 cm H2O. Patient performed EMST exercises at 13 cm H2O with a self-perceived effort level of 10/10 and IMST at 17 cm H2O with a self-perceived effort level of 10/10.  Patient also consumed trials of thin liquids via tsp without overt s/s of aspiration. Patient left upright in wheelchair with all needs within reach. Continue with current plan of care.      Function:   Cognition Comprehension Comprehension assist level: Follows basic conversation/direction with no assist  Expression   Expression assist level: Expresses basic 75 - 89% of the time/requires cueing 10 - 24% of the time. Needs helper to  occlude trach/needs to repeat words.  Social Interaction Social Interaction assist level: Interacts appropriately 90% of the time - Needs monitoring or encouragement for participation or interaction.  Problem Solving Problem solving assist level: Solves basic 90% of the time/requires cueing < 10% of the time  Memory Memory assist level: More than reasonable amount of time    Pain No/Denies Pain   Therapy/Group: Individual Therapy  [name] 04/24/2017, 3:03 PM

## 2017-04-24 NOTE — Progress Notes (Signed)
Physical Therapy Session Note  Patient Details  Name: Brendan Charles MRN: [id number] Date of Birth: Dec 28, 1955  Today's Date: 04/24/2017 PT Individual Time: 1300-1400 PT Individual Time Calculation (min): 60 min   Short Term Goals: Week 2:  PT Short Term Goal 1 (Week 2): Pt will transfer with consistent min assist PT Short Term Goal 2 (Week 2): Pt will propel w/c x150' with no rest breaks and supervision PT Short Term Goal 3 (Week 2): Pt will initiate stair training with PT PT Short Term Goal 4 (Week 2): Pt will ambulate 30' with +1 assist  Skilled Therapeutic Interventions/Progress Updates:    no c/o pain.  Session focus on NMR via gait training with BWSTT.    Pt ambulates 47' +92' +71' with BWS on treadmill with mod/max assist to facilitate weight shift and LLE/RLE advance/placement.  PT provided education and mod multimodal cues for upright posture and terminal knee extension for improved gait pattern throughout session.    PT applied BLE PLS AFO in preparation for second PT session today.    Pt returned to room at end of session and positioned upright in w/c with call bell in reach and needs met.   Therapy Documentation Precautions:  Precautions Precautions: Fall Precaution Comments: new L hemiparesis; recent CVA with R sided weakness Restrictions Weight Bearing Restrictions: No   See Function Navigator for Current Functional Status.   Therapy/Group: Individual Therapy  Michel Santee 04/24/2017, 2:03 PM

## 2017-04-24 NOTE — Progress Notes (Signed)
Occupational Therapy Session Note  Patient Details  Name: Brendan Charles MRN: [id number] Date of Birth: 1956/01/16  Today's Date: 04/24/2017 OT Individual Time: 0902-1000 OT Individual Time Calculation (min): 58 min   Short Term Goals: Week 2:  OT Short Term Goal 1 (Week 2): Pt will complete toilet transfers with S. OT Short Term Goal 2 (Week 2): Pt will complete 3/3 toileting tasks with S. OT Short Term Goal 3 (Week 2): Pt will be able to stand at sink with S to perform grooming tasks. OT Short Term Goal 4 (Week 2): Pt will use AE for bathing LB to increase safety with balance. OT Short Term Goal 5 (Week 2): Pt will use AE for donning socks and pants using his LUE as an active assist.  Skilled Therapeutic Interventions/Progress Updates:    Pt incontinent of urine upon OT arrival. Pt states he can't tell when he needs to urinate, but can tell when he has to have a BM. Discussed timed toileting with pt. Pt completed stquat-pivot bed>wc>drop arm commode over toilet with min/mod A. Pt with successful BM. Worked on hip hike and trunk control to reach behind for hygiene. Pt then tranfserred toilet>wc>tub bench with heavy use of grab bars to transfer into shower despite VC for power up. Bathing completed with focus on L UE coordination and upright posture. Pt needed verbal and tactile cues to maintain trunk extension 80% of the time during bathing.Pt with R hip now weaker than L, requiring modification to LB dressing by threading R LE first. Pt needed Mod A to power up to stand with verbal and tactile cues to maintain anterior weight shift and power up. Min/Mod A for standing balance while reaching to pull up pants. Had pt stay standing while brushing hair at sink. Pt with L LE hyperextension needing mod A to correct. Pt utilized sock-aid to don socks today with set-up A and tactile cues for sitting posture. Pt left seated in wc with safety belt on and needs met.   Therapy Documentation Precautions:   Precautions Precautions: Fall Precaution Comments: new L hemiparesis; recent CVA with R sided weakness Restrictions Weight Bearing Restrictions: No Pain: Pain Assessment Pain Assessment: No/denies pain Pain Score: 0-No pain ADL: ADL ADL Comments: Please see functional navigator  See Function Navigator for Current Functional Status.   Therapy/Group: Individual Therapy  [name] 04/24/2017, 9:14 AM

## 2017-04-24 NOTE — Progress Notes (Signed)
Social Work Patient ID: [name], male   DOB: [birth year], 61 y.o.   MRN: [id number]   CSW spoke with pt's ST who mentioned that pt was awaiting visit from [facility] to complete Advanced Directives.  CSW called Theda Belfast in [facility] and asked if they could f/u with pt on this.  He stated that someone would be to see pt today.

## 2017-04-24 NOTE — Consult Note (Signed)
Neuropsychological Consultation   Patient:   Brendan Charles   DOB:   [birth year]  MR Number:  [id number]  Location:  [facility] [address] [facility] [address] [id number] [place] [address] Dept: [phone] Loc: [phone]           Date of Service:   04/24/2017  Start Time:   8 AM End Time:   9 AM  Provider/Observer:  Arley Phenix, Psy.D.       Clinical Neuropsychologist       Billing Code/Service: [phone] 4 Units  Chief Complaint:    Brendan Charles is a 61 year old right handed male with history of hep C and recent CVA with right hemiparesis due to left MCA infarct.  Admitted 8/13 and discharged 8/14 min assist.  04/12/2017 developed left sided weakness and numbness along with dysarthria.  MRI small acute right medullary infarct without hemorrhage or mass effect.  Left corona radiata basal ganglia infarct from 8/13 stroke.  Recommendation for physical medicine consult and admitted for CIR program.  Patient has continued to expressive issues with depression and adjustment issues following this most recent stroke.  He has recovered well from first one in August but has not recovered like before.  Tearful and continued expressive language issues.  Over past week tearfulness has improved and patient reports mood has been improving.  Reports that he is worried about how much help he will be able to get from family after discharge.  Reason for Service:  Brendan Charles was referred for neuropsychological consultation due to ongoing coping issues, depressvie reaction and expressive language issues.  Below is the HPI for the current admission.    HPI: Brendan Charles 61 year old right-handed male with history of tobacco abuse, hepatitis C and recent CVA with right hemiparesis due to left MCA infarct admitted 03/20/2017 discharge 03/21/2017 ambulating minimal assist to 100 feet using a rolling walker. Discharged on aspirin and  Plavix. Per chart review patient lives with family. Was using a walker prior to admission. 2 level home with bath and bedroom on main level and 5 steps to entry of home. Family can assist as needed. He was able to complete ADL tasks with supervision for safety. He was receiving home therapies with advanced home care. Patient was doing well until 04/12/2017 with left-sided weakness and numbnessas well as severe dysarthria. CT/MRI showed small acute right medullary infarct no associated hemorrhage or mass effect. Evolution of left corona radiata basal ganglia infarct since 03/20/2017. CT angiogram head and neck with no dissection or aneurysm. No large vessel occlusion. Recent echocardiogram with ejection fraction of 65% overall motion abnormality. Patient also with recent left ureteral stone seen by urology Dr. Cleotis Lema underwent a cystoscopy 03/23/2017 showing moderate hydronephrosis no masses or lesions. Plan was for stone extraction in approximately 4 weeks. He had been placed on Flomax. Patient currently remains on aspirin and Plavix as prior to admission. Subcutaneous Lovenox for DVT prophylaxis. Mechanical soft thin liquid diet. Physical therapy evaluation completed 04/12/2017 with recommendations of physical medicine rehabilitation consult. Patient was admitted for comprehensive rehabilitation program.  Current Status:  The patient is having adjustment disorder with primary depressive response.  Denies any significant anxiety symptoms.  While the patient is starting to worry more, the level is appropriate to the situation.   Behavioral Observation: Brendan Charles  presents as a 61 y.o.-year-old Right Caucasian Male who appeared his stated age. his dress was Appropriate and  he was Well Groomed and his manners were Appropriate to the situation.  his participation was indicative of Appropriate and Redirectable behaviors.  There were physical disabilities noted.  he displayed an appropriate level of  cooperation and motivation.     Interactions:    Active Appropriate and Redirectable  Attention:   abnormal and attention span appeared shorter than expected for age  Memory:   abnormal; remote memory intact, recent memory impaired  Visuo-spatial:  within normal limits  Speech (Volume):  low  Speech:   non-fluent aphasia; slurred  Thought Process:  Coherent and Relevant  Though Content:  WNL;   Orientation:   person and time/date  Judgment:   Fair  Planning:   Fair  Affect:    Depressed and Tearful  Mood:    Depressed  Insight:   Present  Intelligence:   normal  Medical History:   Past Medical History:  Diagnosis Date  . Anxiety   . Chronic back pain   . CVA (cerebral vascular accident) (HCC) 03/20/2017  . Hepatitis C    HEP C, never treated, 2007   Family Med/Psych History:  Family History  Problem Relation Age of Onset  . Cancer Mother   . Colon cancer Neg Hx     Risk of Suicide/Violence: low Patient denies any SI or HI, although he does display significant issues with depression over loss of function.  Impression/DX:  Brendan Charles is a 61 year old right handed male with history of hep C and recent CVA with right hemiparesis due to left MCA infarct.  Admitted 8/13 and discharged 8/14 min assist.  04/12/2017 developed left sided weakness and numbness along with dysarthria.  MRI small acute right medullary infarct without hemorrhage or mass effect.  Left corona radiata basal ganglia infarct from 8/13 stroke.  Recommendation for physical medicine consult and admitted for CIR program.  Patient has continued to expressive issues with depression and adjustment issues following this most recent stroke.  He has recovered well from first one in August but has not recovered like before.  Tearful and continued expressive language issues.  The patient is having adjustment disorder with primary depressive response.  Denies any significant anxiety symptoms.   Patient does have  past history of anxiety disorder, the level of worry appears appropriate to current situation.  Depressive symptoms are improving and we worked on these issues during visit today.         Electronically Signed   _______________________ Arley Phenix, Psy.D.

## 2017-04-24 NOTE — Progress Notes (Signed)
Physical Therapy Session Note  Patient Details  Name: Brendan Charles MRN: [id number] Date of Birth: September 20, 1955  Today's Date: 04/24/2017 PT Individual Time: 2091-0681 PT Individual Time Calculation (min): 23 min   Short Term Goals: Week 2:  PT Short Term Goal 1 (Week 2): Pt will transfer with consistent min assist PT Short Term Goal 2 (Week 2): Pt will propel w/c x150' with no rest breaks and supervision PT Short Term Goal 3 (Week 2): Pt will initiate stair training with PT PT Short Term Goal 4 (Week 2): Pt will ambulate 30' with +1 assist  Skilled Therapeutic Interventions/Progress Updates:    no c/o pain but reports fatigue.   Gait training x60' with +2 HHA, increased assist needed for weight shift with fatigue, especially for RLE clearance.  Pt overall demos improved posture, step length, and terminal knee extension.  Returned to bed at end of session with supervision for squat/pivot and sit>supine, min cues for set up.  PT inspected pt's ankles/feet for signs of intolerance of AFO and found none.  Positioned to comfort with call bell in reach and needs met.   Therapy Documentation Precautions:  Precautions Precautions: Fall Precaution Comments: new L hemiparesis; recent CVA with R sided weakness Restrictions Weight Bearing Restrictions: No   See Function Navigator for Current Functional Status.   Therapy/Group: Individual Therapy  Michel Santee 04/24/2017, 4:27 PM

## 2017-04-24 NOTE — Progress Notes (Signed)
Subjective/Complaints:  Pt states he received a foot/ankle brace for the R side after his first stroke in August, reports he hasn't tried a brace with therapy yet, has R AFO at home  No issues overnite  ROS- neg CP, SOB, N/V/D  Objective: Vital Signs: Blood pressure 128/80, pulse 95, temperature 97.8 F (36.6 C), temperature source Oral, resp. rate 18, height  (1.803 m), weight 71.2 kg (157 lb), SpO2 98 %. No results found. Results for orders placed or performed during the hospital encounter of 04/13/17 (from the past 72 hour(s))  CBC     Status: None   Collection Time: 04/21/17  7:59 AM  Result Value Ref Range   WBC 10.4 4.0 - 10.5 K/uL   RBC 4.50 4.22 - 5.81 MIL/uL   Hemoglobin 13.7 13.0 - 17.0 g/dL   HCT 30.8 65.7 - 84.6 %   MCV 92.2 78.0 - 100.0 fL   MCH 30.4 26.0 - 34.0 pg   MCHC 33.0 30.0 - 36.0 g/dL   RDW 96.2 95.2 - 84.1 %   Platelets 357 150 - 400 K/uL     HEENT: normal and dysarthric Cardio: RRR and no murmur Resp: CTA B/L and unlabored GI: BS positive and NT, ND Extremity:  Pulses positive and No Edema Skin:   Intact Neuro: Alert/Oriented, Cranial Nerve Abnormalities Left central VII, Normal Sensory, Abnormal Motor 3- Left delt, Bi, tri, finger flexor/ext, hip/knee extensor synergy, 2-/5 at ankle, 4/5 in RUE and RLE   and Dysarthric Musc/Skel:  Other no pain with UE or LE ROM Gen NAD   Assessment/Plan: 1. Functional deficits secondary to acute left hemiparesis, subacute right hemiparesis which require 3+ hours per day of interdisciplinary therapy in a comprehensive inpatient rehab setting. Physiatrist is providing close team supervision and 24 hour management of active medical problems listed below. Physiatrist and rehab team continue to assess barriers to discharge/monitor patient progress toward functional and medical goals. FIM: Function - Bathing Position: Shower Body parts bathed by patient: Right arm, Left arm, Chest, Abdomen, Front perineal  area, Left upper leg, Right upper leg, Buttocks, Right lower leg, Left lower leg Body parts bathed by helper: Back Assist Level: Touching or steadying assistance(Pt > 75%)  Function- Upper Body Dressing/Undressing What is the patient wearing?: Pull over shirt/dress Pull over shirt/dress - Perfomed by patient: Thread/unthread right sleeve, Put head through opening, Thread/unthread left sleeve, Pull shirt over trunk Pull over shirt/dress - Perfomed by helper: Thread/unthread left sleeve, Pull shirt over trunk Assist Level: Set up Function - Lower Body Dressing/Undressing What is the patient wearing?: Pants, Non-skid slipper socks Position: Wheelchair/chair at sink Pants- Performed by patient: Thread/unthread right pants leg, Thread/unthread left pants leg, Pull pants up/down Pants- Performed by helper: Thread/unthread left pants leg, Pull pants up/down Non-skid slipper socks- Performed by helper: Don/doff right sock, Don/doff left sock Shoes - Performed by helper: Don/doff right shoe, Don/doff left shoe Assist for footwear: Setup (sock aide) Assist for lower body dressing: Touching or steadying assistance (Pt > 75%)  Function - Toileting Toileting activity did not occur: N/A Toileting steps completed by patient: Adjust clothing prior to toileting, Adjust clothing after toileting, Performs perineal hygiene Toileting steps completed by helper: Adjust clothing after toileting Toileting Assistive Devices: Grab bar or rail Assist level: Touching or steadying assistance (Pt.75%)  Function - Archivist transfer assistive device: Grab bar Assist level to toilet: Touching or steadying assistance (Pt > 75%) Assist level from toilet: Touching or steadying assistance (Pt > 75%)  Function - Chair/bed transfer Chair/bed transfer method: Squat pivot Chair/bed transfer assist level: Moderate assist (Pt 50 - 74%/lift or lower) (best attempt steady assist) Chair/bed transfer assistive  device: Armrests, Bedrails Chair/bed transfer details: Verbal cues for precautions/safety, Verbal cues for technique, Tactile cues for weight shifting, Tactile cues for sequencing  Function - Locomotion: Wheelchair Will patient use wheelchair at discharge?: Yes Type: Manual Max wheelchair distance: 100 Assist Level: Supervision or verbal cues Assist Level: Supervision or verbal cues Wheel 150 feet activity did not occur: Safety/medical concerns Turns around,maneuvers to table,bed, and toilet,negotiates 3% grade,maneuvers on rugs and over doorsills: No Function - Locomotion: Ambulation Assistive device: Other (comment) (3 muskateers) Max distance: 92 Assist level: 2 helpers (+2 mod assist) Walk 10 feet activity did not occur: Safety/medical concerns Assist level: 2 helpers Walk 50 feet with 2 turns activity did not occur: Safety/medical concerns Assist level: 2 helpers Walk 150 feet activity did not occur: Safety/medical concerns Walk 10 feet on uneven surfaces activity did not occur: Safety/medical concerns  Function - Comprehension Comprehension: Auditory Comprehension assist level: Follows basic conversation/direction with no assist  Function - Expression Expression: Verbal Expression assist level: Expresses basic 90% of the time/requires cueing < 10% of the time.  Function - Social Interaction Social Interaction assist level: Interacts appropriately 90% of the time - Needs monitoring or encouragement for participation or interaction.  Function - Problem Solving Problem solving assist level: Solves basic 90% of the time/requires cueing < 10% of the time  Function - Memory Memory assist level: More than reasonable amount of time Patient normally able to recall (first 3 days only): Current season, Location of own room, Staff names and faces, That he or she is in a hospital  Medical Problem List and Plan: 1. Right hemiparesis as well as left side weakness/dysarthriasecondary  to acute lacunar infarction involving the right medulla oblongata and medial aspect. Subacute large vessel left MCA infarct due to intracranial stenosis-dual antiplatelet ASA and Plavix, Will discuss bracing issues with PT CIR PT OT SLP        2. DVT Prophylaxis/Anticoagulation: Subcutaneous Lovenox. Monitor platelet counts of any signs of bleeding 3. Pain Management/chronic back pain: Oxycodone as needed 4. Mood: Xanax 0.5 mg 3 times a day as needed 5. Neuropsych: This patient iscapable of making decisions on hisown behalf. 6. Skin/Wound Care: Routine skin checks 7. Fluids/Electrolytes/Nutrition: Routine I&O's with follow-up chemistries 8.Hyperlipidemia. Lipitor 9.Tobacco abuse counseling 10.Recent left ureteral stone. Has hematuria potentiated by anticoagulants, last Hgb is 14.8 on 9/7 will repeat 9/14 13.7  Continue Flomax. Follow-up urology services, has stent placement, scheduled for removal which will be delayed by CVA, uro f/u as OP 11.Hypertension. Norvasc 5 mg daily. good control 9/17 Vitals:   04/23/17 1423 04/24/17 0500  BP: 117/79 128/80  Pulse: 99 95  Resp: 18 18  Temp: 98.5 F (36.9 C) 97.8 F (36.6 C)  SpO2: 97% 98%  12.  Mild leukocytosis, afebrile 13.  Tachycardia- no anemia, volume status looks normal, EKG show sinus rhythm- will monitor  14.  Insomnia - prn trazodone, up since 2a will trial remeron 15.  Pt reports no BM x 2 d, discussed constipation related to immobility 16.  Bladder incont with freq at noc no dysuria, likely spastic bladder  17.  Emotional lability vs adjustment d/o f/u neuropsych, pt does not want med management of this, no further labile episodes noted   LOS (Days) 11 A FACE TO FACE EVALUATION WAS PERFORMED  KIRSTEINS,ANDREW E 04/24/2017, 6:47 AM

## 2017-04-25 ENCOUNTER — Inpatient Hospital Stay (HOSPITAL_COMMUNITY): Payer: Medicare Other

## 2017-04-25 ENCOUNTER — Inpatient Hospital Stay (HOSPITAL_COMMUNITY): Payer: Medicare Other | Admitting: Occupational Therapy

## 2017-04-25 ENCOUNTER — Inpatient Hospital Stay (HOSPITAL_COMMUNITY): Payer: Medicare Other | Admitting: Physical Therapy

## 2017-04-25 NOTE — Progress Notes (Signed)
Occupational Therapy Session Note  Patient Details  Name: Brendan Charles MRN: [id number] Date of Birth: 1956/08/06  Today's Date: 04/25/2017 OT Individual Time: 1308-6578 OT Individual Time Calculation (min): 56 min    Short Term Goals: Week 2:  OT Short Term Goal 1 (Week 2): Pt will complete toilet transfers with S. OT Short Term Goal 2 (Week 2): Pt will complete 3/3 toileting tasks with S. OT Short Term Goal 3 (Week 2): Pt will be able to stand at sink with S to perform grooming tasks. OT Short Term Goal 4 (Week 2): Pt will use AE for bathing LB to increase safety with balance. OT Short Term Goal 5 (Week 2): Pt will use AE for donning socks and pants using his LUE as an active assist.  Skilled Therapeutic Interventions/Progress Updates:    Pt resting in bed upon OT arrival and was agreeable to participating in self-care and therapeutic activities to improve functional safety and I with functional tasks.  Pt transitioned to sitting EOB with SBA and performed squat pivot transfer bed>w/c with Min A.  Pt donned pants while seated in w/c at sink with VCing for techniques and trunk awareness.  Assist provided to manage LEs when crossing legs into tailor sit.  Pt stood at sink with Mod A to don shirt as therapeutic activity to address postural control and body awareness with Mod facilitation techniques for upright posture.  Pt donned shirt with increased time in standing.  Education provided on friction-less adaptive techniques to don TED hose, pt assisted in donning L TEDs.  Shoe buttons applied to bilateral shoes to improve independence in managing shoelaces and pt demonstrated ability to fasten laces over buttons.  In gym, pt participated in core strengthening activities seated EOM on wedge to promote anterior pelvic tilt and improve postural control while completing functional reaching with LUE for improved proximal strength and coordination necessary for increased I in functional tasks.  Pt  tolerated well however fatigued throughout card matching activity, SBA provided initially with Min-Mod A provided towards end of task to maintain L shoulder flexion.  Pt assisted back to room and remained seated in w/c with call light and quick-release belt in place.  All needs met upon OT departure.  Therapy Documentation Precautions:  Precautions Precautions: Fall Precaution Comments: new L hemiparesis; recent CVA with R sided weakness Restrictions Weight Bearing Restrictions: No General:   Vital Signs:   Pain: Pain Assessment Pain Assessment: No/denies pain Pain Score: 0-No pain ADL: ADL ADL Comments: Please see functional navigator  See Function Navigator for Current Functional Status.   Therapy/Group: Individual Therapy  [name] 04/25/2017, 10:00 AM

## 2017-04-25 NOTE — Progress Notes (Signed)
Speech Language Pathology Daily Session Note  Patient Details  Name: Brendan Charles MRN: [id number] Date of Birth: 1955/10/23  Today's Date: 04/25/2017 SLP Individual Time: 6213-0865 SLP Individual Time Calculation (min): 31 min  Short Term Goals: Week 2: SLP Short Term Goal 1 (Week 2): Patient will perform pharyngeal strengthening exercises with supervision verbal cues for accuracy.  SLP Short Term Goal 2 (Week 2): Patient will perform 25 repetitions of EMST/IMST excerises with Mod A verbal cues for accuracy and a self-percieved effort level of 8/10.  SLP Short Term Goal 3 (Week 2): Patient will consume trials of thin liquids via tsp without overt s/s of aspiration with supervision verbal cues for use of swallowing strategies to assess readiness for repeat MBS.  SLP Short Term Goal 4 (Week 2): Patient will utilize speech intelligibility strategies to maximize speech intelligibility at the phrase level to 75% with Min A verbal cues.  Skilled Therapeutic Interventions: Skilled ST services focused on swallow and speech skills. SLP facilitated water protocol via tsp with no overt s/s aspiration, however given cup sips immediate cough following consumption. Pt demonstrated RMT utilizing EMT 20 reps with an effort level of 10/10 and IMST 20 reps with an effortful of 8/10. Pt requested to get back in bed and was left in bed with call bell within reach. Recommend to continue ST services.      Function:   Cognition Comprehension Comprehension assist level: Follows basic conversation/direction with no assist  Expression   Expression assist level: Expresses basic 75 - 89% of the time/requires cueing 10 - 24% of the time. Needs helper to occlude trach/needs to repeat words.  Social Interaction Social Interaction assist level: Interacts appropriately with others - No medications needed.  Problem Solving Problem solving assist level: Solves basic 90% of the time/requires cueing < 10% of the time   Memory Memory assist level: More than reasonable amount of time    Pain Pain Assessment Pain Assessment: No/denies pain Pain Score: 0-No pain  Therapy/Group: Individual Therapy  Koden Hunzeker  [facility] 04/25/2017, 12:32 PM

## 2017-04-25 NOTE — Progress Notes (Signed)
Physical Therapy Session Note  Patient Details  Name: Brendan Charles MRN: [id number] Date of Birth: 21-Jan-1956  Today's Date: 04/25/2017 PT Individual Time: 1530-1600 PT Individual Time Calculation (min): 30 min  and Today's Date: 04/25/2017 PT Missed Time: 30 Minutes Missed Time Reason: Other (Comment) (fall)  Short Term Goals: Week 2:  PT Short Term Goal 1 (Week 2): Pt will transfer with consistent min assist PT Short Term Goal 2 (Week 2): Pt will propel w/c x150' with no rest breaks and supervision PT Short Term Goal 3 (Week 2): Pt will initiate stair training with PT PT Short Term Goal 4 (Week 2): Pt will ambulate 30' with +1 assist  Skilled Therapeutic Interventions/Progress Updates:    no c/o pain.  Session focus on gait and standing balance.    Pt transitions sit<>stand throughout session with steady assist, fade to supervision with verbal cues for hand placement on armrest versus seat cushion.  Gait training x65' with min<>mod assist, mod multimodal cues for posture, step length, and RLE activation.  Static standing balance during ball tap activity with overall min assist for balance.  Pt attempted to bring both hands off RW to catch ball, forward LOB requiring max assist to control descent to floor.  Pt reports no injury and no injury noted.  BP assessed and 144/99, RN notified.  Pt returned to room and bed with call bell in reach and needs met.  BP once back in bed 144/97.    Therapy Documentation Precautions:  Precautions Precautions: Fall Precaution Comments: new L hemiparesis; recent CVA with R sided weakness Restrictions Weight Bearing Restrictions: No General: PT Amount of Missed Time (min): 30 Minutes PT Missed Treatment Reason: Other (Comment) (fall)   See Function Navigator for Current Functional Status.   Therapy/Group: Individual Therapy  Michel Santee 04/25/2017, 4:19 PM

## 2017-04-25 NOTE — Progress Notes (Signed)
Physical Therapy Session Note  Patient Details  Name: Brendan Charles MRN: [id number] Date of Birth: 07-03-1956  Today's Date: 04/25/2017 PT Individual Time: 1300-1345 PT Individual Time Calculation (min): 45 min   Short Term Goals: Week 2:  PT Short Term Goal 1 (Week 2): Pt will transfer with consistent min assist PT Short Term Goal 2 (Week 2): Pt will propel w/c x150' with no rest breaks and supervision PT Short Term Goal 3 (Week 2): Pt will initiate stair training with PT PT Short Term Goal 4 (Week 2): Pt will ambulate 30' with +1 assist  Skilled Therapeutic Interventions/Progress Updates:    no c/o pain.  Session focus on bed mobility, transfers, and gait training with RW.  Pt transitions sit<>supine x2 with min assist to initiate trunk elevation.  Lower body dressing from bed level 2/2 urinary incontinence.  Pt performs clothing management and hygiene with increased time.  Squat/pivot throughout session with min assist.  Stair negotiation x8 steps with 2 rails, min assist for balance and mod multimodal cues for sequencing and posture.  Gait training x40' +40' with mod assist overall, increased time and assist on second trial last 10' due to fatigue.  Pt returned to room at end of session and positioned with call bell in reach and needs met.    Therapy Documentation Precautions:  Precautions Precautions: Fall Precaution Comments: new L hemiparesis; recent CVA with R sided weakness Restrictions Weight Bearing Restrictions: No   See Function Navigator for Current Functional Status.   Therapy/Group: Individual Therapy  Michel Santee 04/25/2017, 4:04 PM

## 2017-04-25 NOTE — Progress Notes (Signed)
Orthopedic Tech Progress Note Patient Details:  Brendan Charles 12-06-1955 [id number]  Patient ID: Brendan Charles, male   DOB: 10-28-1955, 61 y.o.   MRN: [id number]   Brendan Charles 04/25/2017, 2:56 PMCalled [facility] for bilateral AFO's

## 2017-04-25 NOTE — Progress Notes (Signed)
Subjective/Complaints:    Slept ok, having regular BMs  ROS- neg CP, SOB, N/V/D  Objective: Vital Signs: Blood pressure 120/85, pulse 87, temperature 98 F (36.7 C), temperature source Oral, resp. rate 18, height  (1.803 m), weight 71.2 kg (157 lb), SpO2 98 %. No results found. No results found for this or any previous visit (from the past 72 hour(s)).   HEENT: normal and dysarthric Cardio: RRR and no murmur Resp: CTA B/L and unlabored GI: BS positive and NT, ND Extremity:  Pulses positive and No Edema Skin:   Intact Neuro: Alert/Oriented, Cranial Nerve Abnormalities Left central VII, Normal Sensory, Abnormal Motor 3- Left delt, Bi, tri, finger flexor/ext, hip/knee extensor synergy, 2-/5 at ankle, 4/5 in RUE and RLE   and Dysarthric Musc/Skel:  Other no pain with UE or LE ROM Gen NAD   Assessment/Plan: 1. Functional deficits secondary to acute left hemiparesis, subacute right hemiparesis which require 3+ hours per day of interdisciplinary therapy in a comprehensive inpatient rehab setting. Physiatrist is providing close team supervision and 24 hour management of active medical problems listed below. Physiatrist and rehab team continue to assess barriers to discharge/monitor patient progress toward functional and medical goals. FIM: Function - Bathing Position: Shower Body parts bathed by patient: Right arm, Left arm, Chest, Abdomen, Front perineal area, Left upper leg, Right upper leg, Buttocks, Right lower leg, Left lower leg Body parts bathed by helper: Back Assist Level: Touching or steadying assistance(Pt > 75%), Assistive device Assistive Device Comment: long handled sponge  Function- Upper Body Dressing/Undressing What is the patient wearing?: Pull over shirt/dress Pull over shirt/dress - Perfomed by patient: Put head through opening, Thread/unthread left sleeve, Thread/unthread right sleeve, Pull shirt over trunk Pull over shirt/dress - Perfomed by helper:  Thread/unthread left sleeve, Pull shirt over trunk Assist Level: Supervision or verbal cues Function - Lower Body Dressing/Undressing What is the patient wearing?: Pants, Non-skid slipper socks Position: Wheelchair/chair at sink Pants- Performed by patient: Thread/unthread right pants leg, Thread/unthread left pants leg, Pull pants up/down Pants- Performed by helper: Thread/unthread left pants leg, Pull pants up/down Non-skid slipper socks- Performed by patient: Don/doff left sock, Don/doff right sock Non-skid slipper socks- Performed by helper: Don/doff right sock, Don/doff left sock Shoes - Performed by helper: Don/doff right shoe, Don/doff left shoe Assist for footwear: Supervision/touching assist, Setup Assist for lower body dressing: Assistive device, Touching or steadying assistance (Pt > 75%) Assistive Device Comment: sock aid  Function - Toileting Toileting activity did not occur: N/A Toileting steps completed by patient: Performs perineal hygiene Toileting steps completed by helper: Adjust clothing after toileting, Performs perineal hygiene Toileting Assistive Devices: Grab bar or rail Assist level: Touching or steadying assistance (Pt.75%)  Function - Archivist transfer assistive device: Grab bar Assist level to toilet: Touching or steadying assistance (Pt > 75%) Assist level from toilet: Touching or steadying assistance (Pt > 75%)  Function - Chair/bed transfer Chair/bed transfer method: Squat pivot Chair/bed transfer assist level: Moderate assist (Pt 50 - 74%/lift or lower) (best attempt steady assist) Chair/bed transfer assistive device: Armrests, Bedrails Chair/bed transfer details: Verbal cues for precautions/safety, Verbal cues for technique, Tactile cues for weight shifting, Tactile cues for sequencing  Function - Locomotion: Wheelchair Will patient use wheelchair at discharge?: Yes Type: Manual Max wheelchair distance: 100 Assist Level: Supervision  or verbal cues Assist Level: Supervision or verbal cues Wheel 150 feet activity did not occur: Safety/medical concerns Turns around,maneuvers to table,bed, and toilet,negotiates 3% grade,maneuvers on rugs and  over doorsills: No Function - Locomotion: Ambulation Assistive device: Other (comment) (3 muskateers) Max distance: 92 Assist level: 2 helpers (+2 mod assist) Walk 10 feet activity did not occur: Safety/medical concerns Assist level: 2 helpers Walk 50 feet with 2 turns activity did not occur: Safety/medical concerns Assist level: 2 helpers Walk 150 feet activity did not occur: Safety/medical concerns Walk 10 feet on uneven surfaces activity did not occur: Safety/medical concerns  Function - Comprehension Comprehension: Auditory, Visual Comprehension assist level: Follows basic conversation/direction with no assist  Function - Expression Expression: Verbal Expression assist level: Expresses basic needs/ideas: With extra time/assistive device  Function - Social Interaction Social Interaction assist level: Interacts appropriately with others - No medications needed.  Function - Problem Solving Problem solving assist level: Solves basic problems with no assist  Function - Memory Memory assist level: More than reasonable amount of time Patient normally able to recall (first 3 days only): That he or she is in a hospital  Medical Problem List and Plan: 1. Right hemiparesis as well as left side weakness/dysarthriasecondary to acute lacunar infarction involving the right medulla oblongata and medial aspect. Subacute large vessel left MCA infarct due to intracranial stenosis- Team conf in am CIR PT OT SLP        2. DVT Prophylaxis/Anticoagulation: Subcutaneous Lovenox. Monitor platelet counts of any signs of bleeding 3. Pain Management/chronic back pain: Oxycodone as needed 4. Mood: Xanax 0.5 mg 3 times a day as needed 5. Neuropsych: This patient iscapable of making decisions on  hisown behalf. 6. Skin/Wound Care: Routine skin checks 7. Fluids/Electrolytes/Nutrition: Routine I&O's with follow-up chemistries 8.Hyperlipidemia. Lipitor 9.Tobacco abuse counseling 10.Recent left ureteral stone. Has hematuria potentiated by anticoagulants, last Hgb is 14.8 on 9/79/14 13.7  Continue Flomax. Follow-up urology services, has stent placement, scheduled for removal which will be delayed by CVA, uro f/u as OP 11.Hypertension. Norvasc 5 mg daily. good control 9/18 Vitals:   04/24/17 1429 04/25/17 0515  BP: 127/80 120/85  Pulse: (!) 114 87  Resp: 18 18  Temp: 98.7 F (37.1 C) 98 F (36.7 C)  SpO2: 98% 98%  12.  Mild leukocytosis, afebrile 13.  Tachycardia- no anemia, volume status looks normal, EKG show sinus rhythm- will monitor  14.  Insomnia - remeron, slept ok last noc 15.  Pt reports no BM x 2 d, discussed constipation related to immobility 16.  Bladder incont with freq at noc no dysuria, likely spastic bladder  17.  Emotional lability vs adjustment d/o f/u neuropsych, pt does not want med management of this, no further labile episodes noted   LOS (Days) 12 A FACE TO FACE EVALUATION WAS PERFORMED  Xzavian Semmel E 04/25/2017, 7:15 AM

## 2017-04-26 ENCOUNTER — Inpatient Hospital Stay (HOSPITAL_COMMUNITY): Payer: Medicare Other

## 2017-04-26 ENCOUNTER — Inpatient Hospital Stay (HOSPITAL_COMMUNITY): Payer: Medicare Other | Admitting: Physical Therapy

## 2017-04-26 ENCOUNTER — Inpatient Hospital Stay (HOSPITAL_COMMUNITY): Payer: Medicare Other | Admitting: Speech Pathology

## 2017-04-26 ENCOUNTER — Inpatient Hospital Stay (HOSPITAL_COMMUNITY): Payer: Medicare Other | Admitting: Occupational Therapy

## 2017-04-26 DIAGNOSIS — I471 Supraventricular tachycardia: Secondary | ICD-10-CM

## 2017-04-26 LAB — URINALYSIS, ROUTINE W REFLEX MICROSCOPIC
Glucose, UA: 100 mg/dL — AB
Ketones, ur: 15 mg/dL — AB
Nitrite: POSITIVE — AB
PH: 6.5 (ref 5.0–8.0)
Protein, ur: >300 mg/dL — AB
SPECIFIC GRAVITY, URINE: 1.02 (ref 1.005–1.030)

## 2017-04-26 LAB — URINALYSIS, MICROSCOPIC (REFLEX): Squamous Epithelial / LPF: NONE SEEN

## 2017-04-26 NOTE — Progress Notes (Signed)
Modified Barium Swallow Progress Note  Patient Details  Name: Brendan Charles MRN: [id number] Date of Birth: 02-14-1956  Today's Date: 04/26/2017  Modified Barium Swallow completed.  Full report located under Chart Review in the Imaging Section.  Brief recommendations include the following:  Clinical Impression  Patient demonstrates a moderate oropharyngeal dysphagia that appears mildly improved since initial MBS. Oral phase is characterized by poor oral control and bolus cohesion leading to premature spillage of all consistencies.  Patient's pharyngeal phase is characterized by delayed swallow initiation with decreased base of tongue retraction.  Impairments listed above result in intermittent silent penetration and aspiration of thin liquids via tsp and cup. However, amount of aspirates and episodes of aspiration have decreased since initial MBS. Although patient demonstrated increased cough strength, it was not strong enough to expectorate aspirates. Patient demonstrated efficient mastication of Dys. 2 textures but demonstrated moderate vallecular residue that was reduced with multiple swallows. However, patient required extra time and increased effort to initiate second swallow. For now, recommend patient remain on current diet with trials of Dys. 2 textures with SLP and continue water protocol via cup. Also recommend patient continue RMT and pharyngeal strengthening exercises. Discussed results of MBS with the patient and he verbalized understanding and agreement.   Swallow Evaluation Recommendations       SLP Diet Recommendations: Dysphagia 1 (Puree) solids;Free water protocol after oral care;Nectar thick liquid   Liquid Administration via: Cup   Medication Administration: Crushed with puree   Supervision: Patient able to self feed;Full supervision/cueing for compensatory strategies   Compensations: Slow rate;Follow solids with liquid;Small sips/bites;Minimize environmental  distractions;Multiple dry swallows after each bite/sip;Clear throat intermittently   Postural Changes: Seated upright at 90 degrees   Oral Care Recommendations: Oral care BID   Other Recommendations: Order thickener from pharmacy;Have oral suction available;Prohibited food (jello, ice cream, thin soups);Remove water pitcher    Lourdes Kucharski 04/26/2017,3:50 PM

## 2017-04-26 NOTE — Progress Notes (Signed)
Occupational Therapy Session Note  Patient Details  Name: Brendan Charles MRN: [id number] Date of Birth: September 09, 1955  Today's Date: 04/26/2017 OT Individual Time: 1430-1450 OT Individual Time Calculation (min): 20 min   Short Term Goals: Week 2:  OT Short Term Goal 1 (Week 2): Pt will complete toilet transfers with S. OT Short Term Goal 2 (Week 2): Pt will complete 3/3 toileting tasks with S. OT Short Term Goal 3 (Week 2): Pt will be able to stand at sink with S to perform grooming tasks. OT Short Term Goal 4 (Week 2): Pt will use AE for bathing LB to increase safety with balance. OT Short Term Goal 5 (Week 2): Pt will use AE for donning socks and pants using his LUE as an active assist.  Skilled Therapeutic Interventions/Progress Updates:    Pt greeted in bed with nurse tech present. Worked on hip bridging to pull up pants with assistance needed to stabilize LE's to achieve full bridge. Pt reports fatigue and feels bad overall today. Motivated to try to participate in EOB tasks. RN entered room and reported need for urine sample. Pt came to sitting EOB with increased time and min A to elevate trunk. Min A to maintain upright posture, then sit<>stand with Mod A. Pt needed assistance with clothing management and urinal placement to try to void in standing. Facilitated upright posture with pt able to tolerate 1.5 mins in standing. Pt unable to void successfully and was returned to supine. RN reported urgent need to get urine sample so pt left semi-reclined in bed with needs met and nursing present.  Therapy Documentation Precautions:  Precautions Precautions: Fall Precaution Comments: new L hemiparesis; recent CVA with R sided weakness Restrictions Weight Bearing Restrictions: No General: General OT Amount of Missed Time: 40 Minutes Vital Signs: Therapy Vitals Temp: 98.6 F (37 C) Temp Source: Oral Pulse Rate: (!) 132 Resp: (!) 28 BP: 116/73 Patient Position (if appropriate):  Lying Oxygen Therapy SpO2: 98 % O2 Device: Not Delivered Pain: Pain Assessment Pain Assessment: No/denies pain ADL: ADL ADL Comments: Please see functional navigator  See Function Navigator for Current Functional Status.   Therapy/Group: Individual Therapy  [name] 04/26/2017, 2:53 PM

## 2017-04-26 NOTE — Progress Notes (Signed)
Incontinent urine X 2 during night. Continent BM. PRN tums given at HS. Brendan Charles A

## 2017-04-26 NOTE — Progress Notes (Signed)
Subjective/Complaints:  Patient did not sleep well last night, he denied any urinary problems or bowel problems. No pain issues. He does have some pain with urination, but this did not keep him awake. He does feel anxious.    ROS- neg CP, SOB, N/V/D  Objective: Vital Signs: Blood pressure 129/78, pulse (!) 115, temperature 98.6 F (37 C), temperature source Oral, resp. rate (!) 22, height _0  (1.803 m), weight 71.2 kg (157 lb), SpO2 98 %. No results found. No results found for this or any previous visit (from the past 72 hour(s)).   HEENT: normal and dysarthric Cardio: RRR and no murmur Resp: CTA B/L and unlabored GI: BS positive and NT, ND Extremity:  Pulses positive and No Edema Skin:   Intact Neuro: Alert/Oriented, Cranial Nerve Abnormalities Left central VII, Normal Sensory, Abnormal Motor 3- Left delt, Bi, tri, finger flexor/ext, hip/knee extensor synergy, 2-/5 at ankle, 4/5 in RUE and RLE   and Dysarthric Musc/Skel:  Other no pain with UE or LE ROM Gen NAD   Assessment/Plan: 1. Functional deficits secondary to acute left hemiparesis, subacute right hemiparesis which require 3+ hours per day of interdisciplinary therapy in a comprehensive inpatient rehab setting. Physiatrist is providing close team supervision and 24 hour management of active medical problems listed below. Physiatrist and rehab team continue to assess barriers to discharge/monitor patient progress toward functional and medical goals. FIM: Function - Bathing Position: Shower Body parts bathed by patient: Right arm, Left arm, Chest, Abdomen, Front perineal area, Left upper leg, Right upper leg, Buttocks, Right lower leg, Left lower leg Body parts bathed by helper: Back Assist Level: Touching or steadying assistance(Pt > 75%), Assistive device Assistive Device Comment: long handled sponge  Function- Upper Body Dressing/Undressing What is the patient wearing?: Pull over shirt/dress Pull over shirt/dress  - Perfomed by patient: Thread/unthread right sleeve, Thread/unthread left sleeve, Put head through opening, Pull shirt over trunk Pull over shirt/dress - Perfomed by helper: Thread/unthread left sleeve, Pull shirt over trunk Assist Level: Supervision or verbal cues Function - Lower Body Dressing/Undressing What is the patient wearing?: Pants, Non-skid slipper socks Position: Wheelchair/chair at sink Pants- Performed by patient: Thread/unthread right pants leg, Thread/unthread left pants leg Pants- Performed by helper: Pull pants up/down Non-skid slipper socks- Performed by patient: Don/doff left sock, Don/doff right sock Non-skid slipper socks- Performed by helper: Don/doff right sock, Don/doff left sock Shoes - Performed by helper: Don/doff right shoe, Don/doff left shoe Assist for footwear: Supervision/touching assist, Setup Assist for lower body dressing: Assistive device, Touching or steadying assistance (Pt > 75%) Assistive Device Comment: sock aid  Function - Toileting Toileting activity did not occur: N/A Toileting steps completed by patient: Performs perineal hygiene Toileting steps completed by helper: Adjust clothing after toileting, Performs perineal hygiene Toileting Assistive Devices: Grab bar or rail Assist level: Touching or steadying assistance (Pt.75%)  Function - Air cabin crew transfer assistive device: Grab bar Assist level to toilet: Touching or steadying assistance (Pt > 75%) Assist level from toilet: Touching or steadying assistance (Pt > 75%)  Function - Chair/bed transfer Chair/bed transfer method: Squat pivot Chair/bed transfer assist level: Touching or steadying assistance (Pt > 75%) Chair/bed transfer assistive device: Armrests, Bedrails Chair/bed transfer details: Verbal cues for precautions/safety, Verbal cues for technique, Tactile cues for weight shifting, Tactile cues for sequencing  Function - Locomotion: Wheelchair Will patient use  wheelchair at discharge?: Yes Type: Manual Max wheelchair distance: 100 Assist Level: Supervision or verbal cues Assist Level: Supervision or verbal  cues Wheel 150 feet activity did not occur: Safety/medical concerns Turns around,maneuvers to table,bed, and toilet,negotiates 3% grade,maneuvers on rugs and over doorsills: No Function - Locomotion: Ambulation Assistive device: Walker-rolling Max distance: 40 Assist level: Moderate assist (Pt 50 - 74%) Walk 10 feet activity did not occur: Safety/medical concerns Assist level: Moderate assist (Pt 50 - 74%) Walk 50 feet with 2 turns activity did not occur: Safety/medical concerns Assist level: 2 helpers Walk 150 feet activity did not occur: Safety/medical concerns Walk 10 feet on uneven surfaces activity did not occur: Safety/medical concerns  Function - Comprehension Comprehension: Auditory Comprehension assist level: Follows basic conversation/direction with no assist  Function - Expression Expression: Verbal Expression assist level: Expresses basic 75 - 89% of the time/requires cueing 10 - 24% of the time. Needs helper to occlude trach/needs to repeat words.  Function - Social Interaction Social Interaction assist level: Interacts appropriately 90% of the time - Needs monitoring or encouragement for participation or interaction.  Function - Problem Solving Problem solving assist level: Solves basic 90% of the time/requires cueing < 10% of the time  Function - Memory Memory assist level: More than reasonable amount of time Patient normally able to recall (first 3 days only): That he or she is in a hospital  Medical Problem List and Plan: 1. Right hemiparesis as well as left side weakness/dysarthriasecondary to acute lacunar infarction involving the right medulla oblongata and medial aspect. Subacute large vessel left MCA infarct due to intracranial stenosis- Team conference today please see physician documentation under team  conference tab, met with team face-to-face to discuss problems,progress, and goals. Formulized individual treatment plan based on medical history, underlying problem and comorbidities. Pt progressing slowly but has the potential to progress beyond wheelchair level. If we extend him an extra week. I think this would be beneficial for the patient's recovery. CIR PT OT SLP        2. DVT Prophylaxis/Anticoagulation: Subcutaneous Lovenox. Monitor platelet counts of any signs of bleeding 3. Pain Management/chronic back pain: Oxycodone as needed 4. Mood: Xanax 0.5 mg 3 times a day as needed 5. Neuropsych: This patient iscapable of making decisions on hisown behalf. 6. Skin/Wound Care: Routine skin checks 7. Fluids/Electrolytes/Nutrition: Routine I&O's with follow-up chemistries 8.Hyperlipidemia. Lipitor 9.Tobacco abuse counseling 10.Recent left ureteral stone. Has hematuria potentiated by anticoagulants, last Hgb is 14.8 on 9/79/14 13.7  Continue Flomax. Follow-up urology services, has stent placement, scheduled for removal which will be delayed by CVA, uro f/u as OP 11.Hypertension. Norvasc 5 mg daily. good control 9/18 Tachycardia has increased, will check EKG, may need to start beta blocker, Vitals:   04/25/17 1821 04/26/17 0420  BP: 136/86 129/78  Pulse: (!) 108 (!) 115  Resp: 18 (!) 22  Temp: 97.8 F (36.6 C) 98.6 F (37 C)  SpO2: 99% 98%  12.  Mild leukocytosis, afebrile 13.  Tachycardia- no anemia, volume status looks normal, EKG show sinus rhythm- will monitor  14.  Insomnia - remeron, His complaints are inconsistent. May need some relaxation strategies 15.  Pt reports no BM x 2 d, discussed constipation related to immobility 16.  Bladder incont with freq at noc no dysuria, likely spastic bladder  17.  Emotional lability vs adjustment d/o f/u neuropsych, pt does not want med management of this, no further labile episodes noted   LOS (Days) 13 A FACE TO FACE EVALUATION WAS  PERFORMED  Beverlie Kurihara E 04/26/2017, 9:13 AM

## 2017-04-26 NOTE — Progress Notes (Signed)
Physical Therapy Session Note  Patient Details  Name: Brendan Charles MRN: [id number] Date of Birth: 08-22-1955  Today's Date: 04/26/2017 PT Individual Time: 1300-1420 PT Individual Time Calculation (min): 80 min   Short Term Goals: Week 2:  PT Short Term Goal 1 (Week 2): Pt will transfer with consistent min assist PT Short Term Goal 2 (Week 2): Pt will propel w/c x150' with no rest breaks and supervision PT Short Term Goal 3 (Week 2): Pt will initiate stair training with PT PT Short Term Goal 4 (Week 2): Pt will ambulate 30' with +1 assist  Skilled Therapeutic Interventions/Progress Updates:    no c/o pain but reporting feeling very tired and weak today.  Session focus on gait training for ortho consult and static sitting balance/postural control.   Pt requires increased time and assist to transfer to EOB and attain sitting balance this session.  States he's been feeling dizzy all day.  PT donned shoes/AFOs total assist for time management.  Pt requires mod assist for squat/pivot transfers to R and L throughout session with decreased power up from pt.  Gait training x20' +10' with RW and overall mod assist, decreased step length on R, ongoing bilat knee flexion in stance that corrects on LLE with verbal cues.    Static sitting balance edge of mat with minimal low back support and supervision/min guard during seated table top, bimanual tasks.  Pt returned to w/c, noted to be warm.  Vitals assessed and pt noted to have slight temp (99.6) and heart rate up to 142.  RN notified and pt returned to bed.    Therapy Documentation Precautions:  Precautions Precautions: Fall Precaution Comments: new L hemiparesis; recent CVA with R sided weakness Restrictions Weight Bearing Restrictions: No   See Function Navigator for Current Functional Status.   Therapy/Group: Individual Therapy  Stephania Fragmin 04/26/2017, 4:26 PM

## 2017-04-26 NOTE — Progress Notes (Signed)
EKG,UA to lab for UA and Culture. Called to Deatra Ina PA for update

## 2017-04-26 NOTE — Progress Notes (Signed)
Occupational Therapy Note  Patient Details  Name: Brendan Charles MRN: [id number] Date of Birth: 1955/10/11  Today's Date: 04/26/2017 OT Individual Time: 1130-1155 OT Individual Time Calculation (min): 25 min   Pt denies pain Individual Therapy  Pt asleep in bed upon arrival but easily aroused.  Pt stated he was tired but agreed to engaging in bed level activities.  Pt engaged in bed mobility and LUE tasks with focus on increased independence and functional use of LUE.  Pt remained in bed with all needs within reach.    Lavone Neri [facility] 04/26/2017, 11:58 AM

## 2017-04-26 NOTE — Plan of Care (Signed)
Problem: RH BLADDER ELIMINATION Goal: RH STG MANAGE BLADDER WITH ASSISTANCE STG Manage Bladder With Min Assistance   Outcome: Not Progressing Max assist-incontinent

## 2017-04-27 ENCOUNTER — Inpatient Hospital Stay (HOSPITAL_COMMUNITY): Payer: Medicare Other | Admitting: Physical Therapy

## 2017-04-27 ENCOUNTER — Inpatient Hospital Stay (HOSPITAL_COMMUNITY): Payer: Medicare Other

## 2017-04-27 ENCOUNTER — Inpatient Hospital Stay (HOSPITAL_COMMUNITY): Payer: Medicare Other | Admitting: Speech Pathology

## 2017-04-27 ENCOUNTER — Inpatient Hospital Stay (HOSPITAL_COMMUNITY): Payer: Medicare Other | Admitting: Occupational Therapy

## 2017-04-27 DIAGNOSIS — N3001 Acute cystitis with hematuria: Secondary | ICD-10-CM

## 2017-04-27 LAB — BASIC METABOLIC PANEL
Anion gap: 13 (ref 5–15)
BUN: 23 mg/dL — AB (ref 6–20)
CO2: 19 mmol/L — AB (ref 22–32)
Calcium: 9 mg/dL (ref 8.9–10.3)
Chloride: 102 mmol/L (ref 101–111)
Creatinine, Ser: 1.24 mg/dL (ref 0.61–1.24)
GFR calc Af Amer: >60 mL/min (ref >60)
GFR calc non Af Amer: >60 mL/min (ref >60)
GLUCOSE: 140 mg/dL — AB (ref 65–99)
POTASSIUM: 3.5 mmol/L (ref 3.5–5.1)
Sodium: 134 mmol/L — ABNORMAL LOW (ref 135–145)

## 2017-04-27 LAB — CBC WITH DIFFERENTIAL/PLATELET
BASOS PCT: 0 %
Basophils Absolute: 0 10*3/uL (ref 0.0–0.1)
Eosinophils Absolute: 0 10*3/uL (ref 0.0–0.7)
Eosinophils Relative: 0 %
HEMATOCRIT: 39.7 % (ref 39.0–52.0)
HEMOGLOBIN: 13.1 g/dL (ref 13.0–17.0)
LYMPHS PCT: 3 %
Lymphs Abs: 0.6 10*3/uL — ABNORMAL LOW (ref 0.7–4.0)
MCH: 30 pg (ref 26.0–34.0)
MCHC: 33 g/dL (ref 30.0–36.0)
MCV: 91.1 fL (ref 78.0–100.0)
Monocytes Absolute: 0.7 10*3/uL (ref 0.1–1.0)
Monocytes Relative: 4 %
Neutro Abs: 18 10*3/uL — ABNORMAL HIGH (ref 1.7–7.7)
Neutrophils Relative %: 93 %
Platelets: 246 10*3/uL (ref 150–400)
RBC: 4.36 MIL/uL (ref 4.22–5.81)
RDW: 14.1 % (ref 11.5–15.5)
WBC: 19.3 10*3/uL — ABNORMAL HIGH (ref 4.0–10.5)

## 2017-04-27 LAB — CREATININE, SERUM
Creatinine, Ser: 1.03 mg/dL (ref 0.61–1.24)
GFR calc Af Amer: >60 mL/min (ref >60)

## 2017-04-27 MED ORDER — SODIUM CHLORIDE 0.45 % IV SOLN: INTRAVENOUS | Status: DC

## 2017-04-27 MED ORDER — CEPHALEXIN 250 MG PO CAPS
250.0000 mg | ORAL_CAPSULE | Freq: Three times a day (TID) | ORAL | Status: DC
  Administered 2017-04-27: 250 mg via ORAL
  Filled 2017-04-27: qty 1

## 2017-04-27 MED ORDER — SODIUM CHLORIDE 0.45 % IV SOLN
INTRAVENOUS | Status: DC
Start: 2017-04-27 — End: 2017-04-28
  Administered 2017-04-27: 11:00:00 via INTRAVENOUS

## 2017-04-27 MED ORDER — AMLODIPINE BESYLATE 2.5 MG PO TABS
2.5000 mg | ORAL_TABLET | Freq: Every day | ORAL | Status: DC
  Administered 2017-04-27 – 2017-05-08 (×12): 2.5 mg via ORAL
  Filled 2017-04-27 (×12): qty 1

## 2017-04-27 MED ORDER — METOPROLOL TARTRATE 12.5 MG HALF TABLET
12.5000 mg | ORAL_TABLET | Freq: Two times a day (BID) | ORAL | Status: DC
  Administered 2017-04-27 – 2017-04-28 (×4): 12.5 mg via ORAL
  Filled 2017-04-27 (×4): qty 1

## 2017-04-27 MED ORDER — DEXTROSE 5 % IV SOLN
1.0000 g | INTRAVENOUS | Status: DC
  Administered 2017-04-27: 1 g via INTRAVENOUS
  Filled 2017-04-27 (×2): qty 10

## 2017-04-27 NOTE — Progress Notes (Signed)
Physical Therapy Session Note  Patient Details  Name: Brendan Charles MRN: [id number] Date of Birth: 04/22/1956  Today's Date: 04/27/2017 PT Individual Time: 0905-0928 PT Individual Time Calculation (min): 23 min   Short Term Goals: Week 2:  PT Short Term Goal 1 (Week 2): Pt will transfer with consistent min assist PT Short Term Goal 2 (Week 2): Pt will propel w/c x150' with no rest breaks and supervision PT Short Term Goal 3 (Week 2): Pt will initiate stair training with PT PT Short Term Goal 4 (Week 2): Pt will ambulate 30' with +1 assist  Skilled Therapeutic Interventions/Progress Updates:  Pt presented in bed hand off from OT. Pt with decreased functional mobility due to UTI. Pt maxA supine to sit to allow nurse to administer meds. Pt sat at EOB total A to intermittent maxA with maxA for improving erect posture to receive meds. Pt sat at EOB x 10 min before returning to supine. Pt unable to perform lateral scoot when at EOB thus required total assist for boosting to [facility]. Pt left in care of nsg.      Therapy Documentation Precautions:  Precautions Precautions: Fall Precaution Comments: new L hemiparesis; recent CVA with R sided weakness Restrictions Weight Bearing Restrictions: No General:   Vital Signs: Therapy Vitals Temp: (!) 101.4 F (38.6 C) Temp Source: Axillary Pain: Pain Assessment Pain Assessment: No/denies pain   See Function Navigator for Current Functional Status.   Therapy/Group: Individual Therapy  Advit Trethewey  Onesti Bonfiglio, PTA  04/27/2017, 12:53 PM

## 2017-04-27 NOTE — Progress Notes (Signed)
Subjective/Complaints:      ROS- neg CP, SOB, N/V/D  Objective: Vital Signs: Blood pressure 125/78, pulse (!) 120, temperature 98.4 F (36.9 C), temperature source Oral, resp. rate 20, height 5' 11" (1.803 m), weight 71.2 kg (157 lb), SpO2 97 %. Dg Swallowing Func-speech Pathology  Result Date: 04/26/2017 Objective Swallowing Evaluation: Type of Study: MBS-Modified Barium Swallow Study Patient Details Name: Brendan Charles MRN: [id number] Date of Birth: 12-Aug-1955 Today's Date: 04/26/2017 Time: SLP Start Time (ACUTE ONLY): 0905-SLP Stop Time (ACUTE ONLY): 0935 SLP Time Calculation (min) (ACUTE ONLY): 30 min Past Medical History: Past Medical History: Diagnosis Date . Anxiety  . Chronic back pain  . CVA (cerebral vascular accident) ([place]) 03/20/2017 . Hepatitis C   HEP C, never treated, 2007 Past Surgical History: Past Surgical History: Procedure Laterality Date . CERVICAL DISC SURGERY  2001  anterior . COLONOSCOPY WITH PROPOFOL N/A 03/14/2017  Procedure: COLONOSCOPY WITH PROPOFOL;  Surgeon: Danie Binder, MD;  Location: [facility];  Service: Endoscopy;  Laterality: N/A;  8:15am . CYSTOSCOPY W/ URETERAL STENT PLACEMENT Left 03/23/2017  Procedure: CYSTOSCOPY WITH LEFT RETROGRADE PYELOGRAM/URETERAL LEFT STENT PLACEMENT;  Surgeon: Cleon Gustin, MD;  Location: [facility];  Service: Urology;  Laterality: Left; . LUMBAR SPINE SURGERY  2015 . POLYPECTOMY  03/14/2017  Procedure: POLYPECTOMY;  Surgeon: Danie Binder, MD;  Location: [facility];  Service: Endoscopy;;  rectum HPI: See H&P  Subjective: "I had a hard time chewing today." Assessment / Plan / Recommendation CHL IP CLINICAL IMPRESSIONS 04/26/2017 Clinical Impression Patient demonstrates a moderate oropharyngeal dysphagia that appears mildly improved since initial MBS. Oral phase is characterized by poor oral control and bolus cohesion leading to premature spillage of all consistencies.  Patient's pharyngeal phase is characterized by delayed  swallow initiation with decreased base of tongue retraction.  Impairments listed above result in intermittent silent penetration and aspiration of thin liquids via tsp and cup. However, amount of aspirates and episodes of aspiration have decreased since initial MBS. Although patient demonstrated increased cough strength, it was not strong enough to expectorate aspirates. Patient demonstrated efficient mastication of Dys. 2 textures but demonstrated moderate vallecular residue that was reduced with multiple swallows. However, patient required extra time and increased effort to initiate second swallow. For now, recommend patient remain on current diet with trials of Dys. 2 textures with SLP and continue water protocol via cup. Also recommend patient continue RMT and pharyngeal strengthening exercises. Discussed results of MBS with the patient and he verbalized understanding and agreement. SLP Visit Diagnosis Dysphagia, oropharyngeal phase (R13.12) Attention and concentration deficit following -- Frontal lobe and executive function deficit following -- Impact on safety and function Moderate aspiration risk;Severe aspiration risk   CHL IP TREATMENT RECOMMENDATION 04/26/2017 Treatment Recommendations Therapy as outlined in treatment plan below   Prognosis 04/26/2017 Prognosis for Safe Diet Advancement Good Barriers to Reach Goals Severity of deficits Barriers/Prognosis Comment -- CHL IP DIET RECOMMENDATION 04/26/2017 SLP Diet Recommendations Dysphagia 1 (Puree) solids;Free water protocol after oral care;Nectar thick liquid Liquid Administration via Cup Medication Administration Crushed with puree Compensations Slow rate;Follow solids with liquid;Small sips/bites;Minimize environmental distractions;Multiple dry swallows after each bite/sip;Clear throat intermittently Postural Changes Seated upright at 90 degrees   CHL IP OTHER RECOMMENDATIONS 04/26/2017 Recommended Consults -- Oral Care Recommendations Oral care BID Other  Recommendations Order thickener from pharmacy;Have oral suction available;Prohibited food (jello, ice cream, thin soups);Remove water pitcher   CHL IP FOLLOW UP RECOMMENDATIONS 04/26/2017 Follow up Recommendations Inpatient Rehab  CHL IP FREQUENCY AND DURATION 04/26/2017 Speech Therapy Frequency (ACUTE ONLY) min 5x/week Treatment Duration 2 weeks      CHL IP ORAL PHASE 04/26/2017 Oral Phase -- Oral - Pudding Teaspoon -- Oral - Pudding Cup -- Oral - Honey Teaspoon NT Oral - Honey Cup NT Oral - Nectar Teaspoon NT Oral - Nectar Cup NT Oral - Nectar Straw -- Oral - Thin Teaspoon Weak lingual manipulation;Reduced posterior propulsion;Delayed oral transit;Decreased bolus cohesion;Premature spillage Oral - Thin Cup Weak lingual manipulation;Delayed oral transit;Decreased bolus cohesion;Premature spillage;Reduced posterior propulsion Oral - Thin Straw -- Oral - Puree Weak lingual manipulation;Reduced posterior propulsion;Premature spillage;Decreased bolus cohesion;Delayed oral transit Oral - Mech Soft Impaired mastication;Delayed oral transit;Decreased bolus cohesion;Reduced posterior propulsion Oral - Regular -- Oral - Multi-Consistency -- Oral - Pill -- Oral Phase - Comment --  CHL IP PHARYNGEAL PHASE 04/26/2017 Pharyngeal Phase Impaired Pharyngeal- Pudding Teaspoon -- Pharyngeal -- Pharyngeal- Pudding Cup -- Pharyngeal -- Pharyngeal- Honey Teaspoon NT Pharyngeal -- Pharyngeal- Honey Cup -- Pharyngeal -- Pharyngeal- Nectar Teaspoon NT Pharyngeal -- Pharyngeal- Nectar Cup NT Pharyngeal -- Pharyngeal- Nectar Straw -- Pharyngeal -- Pharyngeal- Thin Teaspoon Penetration/Aspiration during swallow;Delayed swallow initiation-vallecula;Reduced anterior laryngeal mobility;Reduced tongue base retraction;Pharyngeal residue - valleculae Pharyngeal Material enters airway, passes BELOW cords and not ejected out despite cough attempt by patient Pharyngeal- Thin Cup Penetration/Aspiration during swallow;Delayed swallow initiation-pyriform  sinuses;Pharyngeal residue - valleculae;Pharyngeal residue - pyriform;Reduced anterior laryngeal mobility;Reduced tongue base retraction Pharyngeal Material enters airway, passes BELOW cords without attempt by patient to eject out (silent aspiration);Material enters airway, remains ABOVE vocal cords and not ejected out Pharyngeal- Thin Straw -- Pharyngeal -- Pharyngeal- Puree Delayed swallow initiation-vallecula;Reduced tongue base retraction;Pharyngeal residue - valleculae Pharyngeal -- Pharyngeal- Mechanical Soft Delayed swallow initiation-vallecula;Pharyngeal residue - valleculae Pharyngeal -- Pharyngeal- Regular -- Pharyngeal -- Pharyngeal- Multi-consistency -- Pharyngeal -- Pharyngeal- Pill -- Pharyngeal -- Pharyngeal Comment --  CHL IP CERVICAL ESOPHAGEAL PHASE 04/14/2017 Cervical Esophageal Phase Impaired Pudding Teaspoon -- Pudding Cup -- Honey Teaspoon -- Honey Cup -- Nectar Teaspoon -- Nectar Cup -- Nectar Straw -- Thin Teaspoon -- Thin Cup -- Thin Straw -- Puree -- Mechanical Soft -- Regular -- Multi-consistency -- Pill -- Cervical Esophageal Comment -- No flowsheet data found. PAYNE, [place] 04/26/2017, 3:49 PM              Results for orders placed or performed during the hospital encounter of 04/13/17 (from the past 72 hour(s))  Urinalysis, Routine w reflex microscopic     Status: Abnormal   Collection Time: 04/26/17 12:59 PM  Result Value Ref Range   Color, Urine RED (A) YELLOW    Comment: BIOCHEMICALS MAY BE AFFECTED BY COLOR   APPearance CLOUDY (A) CLEAR   Specific Gravity, Urine 1.020 1.005 - 1.030   pH 6.5 5.0 - 8.0   Glucose, UA 100 (A) NEGATIVE mg/dL   Hgb urine dipstick LARGE (A) NEGATIVE   Bilirubin Urine MODERATE (A) NEGATIVE   Ketones, ur 15 (A) NEGATIVE mg/dL   Protein, ur >300 (A) NEGATIVE mg/dL   Nitrite POSITIVE (A) NEGATIVE   Leukocytes, UA LARGE (A) NEGATIVE  Urinalysis, Microscopic (reflex)     Status: Abnormal   Collection Time: 04/26/17 12:59 PM  Result Value Ref  Range   RBC / HPF TOO NUMEROUS TO COUNT 0 - 5 RBC/hpf   WBC, UA TOO NUMEROUS TO COUNT 0 - 5 WBC/hpf   Bacteria, UA MANY (A) NONE SEEN   Squamous Epithelial / LPF NONE SEEN NONE SEEN  Creatinine, serum  Status: None   Collection Time: 04/27/17  5:30 AM  Result Value Ref Range   Creatinine, Ser 1.03 0.61 - 1.24 mg/dL   GFR calc non Af Amer >60 >60 mL/min   GFR calc Af Amer >60 >60 mL/min    Comment: (NOTE) The eGFR has been calculated using the CKD EPI equation. This calculation has not been validated in all clinical situations. eGFR's persistently <60 mL/min signify possible Chronic Kidney Disease.      HEENT: normal and dysarthric Cardio: RRR and no murmur Resp: CTA B/L and unlabored GI: BS positive and NT, ND Extremity:  Pulses positive and No Edema Skin:   Intact Neuro: Alert/Oriented, Cranial Nerve Abnormalities Left central VII, Normal Sensory, Abnormal Motor 3- Left delt, Bi, tri, finger flexor/ext, hip/knee extensor synergy, 2-/5 at ankle, 4/5 in RUE and RLE   and Dysarthric Musc/Skel:  Other no pain with UE or LE ROM Gen NAD   Assessment/Plan: 1. Functional deficits secondary to acute left hemiparesis, subacute right hemiparesis which require 3+ hours per day of interdisciplinary therapy in a comprehensive inpatient rehab setting. Physiatrist is providing close team supervision and 24 hour management of active medical problems listed below. Physiatrist and rehab team continue to assess barriers to discharge/monitor patient progress toward functional and medical goals. FIM: Function - Bathing Position: Shower Body parts bathed by patient: Right arm, Left arm, Chest, Abdomen, Front perineal area, Left upper leg, Right upper leg, Buttocks, Right lower leg, Left lower leg Body parts bathed by helper: Back Assist Level: Touching or steadying assistance(Pt > 75%), Assistive device Assistive Device Comment: long handled sponge  Function- Upper Body  Dressing/Undressing What is the patient wearing?: Pull over shirt/dress Pull over shirt/dress - Perfomed by patient: Thread/unthread right sleeve, Thread/unthread left sleeve, Put head through opening, Pull shirt over trunk Pull over shirt/dress - Perfomed by helper: Thread/unthread left sleeve, Pull shirt over trunk Assist Level: Supervision or verbal cues Function - Lower Body Dressing/Undressing What is the patient wearing?: Pants, Non-skid slipper socks Position: Wheelchair/chair at sink Pants- Performed by patient: Thread/unthread right pants leg, Thread/unthread left pants leg Pants- Performed by helper: Pull pants up/down Non-skid slipper socks- Performed by patient: Don/doff left sock, Don/doff right sock Non-skid slipper socks- Performed by helper: Don/doff right sock, Don/doff left sock Shoes - Performed by helper: Don/doff right shoe, Don/doff left shoe Assist for footwear: Supervision/touching assist, Setup Assist for lower body dressing: Assistive device, Touching or steadying assistance (Pt > 75%) Assistive Device Comment: sock aid  Function - Toileting Toileting activity did not occur: N/A Toileting steps completed by patient: Performs perineal hygiene Toileting steps completed by helper: Adjust clothing after toileting, Performs perineal hygiene Toileting Assistive Devices: Grab bar or rail Assist level: Touching or steadying assistance (Pt.75%)  Function - Air cabin crew transfer assistive device: Grab bar Assist level to toilet: Touching or steadying assistance (Pt > 75%) Assist level from toilet: Touching or steadying assistance (Pt > 75%)  Function - Chair/bed transfer Chair/bed transfer method: Squat pivot Chair/bed transfer assist level: Touching or steadying assistance (Pt > 75%) Chair/bed transfer assistive device: Armrests, Bedrails Chair/bed transfer details: Verbal cues for precautions/safety, Verbal cues for technique, Tactile cues for weight  shifting, Tactile cues for sequencing  Function - Locomotion: Wheelchair Will patient use wheelchair at discharge?: Yes Type: Manual Max wheelchair distance: 100 Assist Level: Supervision or verbal cues Assist Level: Supervision or verbal cues Wheel 150 feet activity did not occur: Safety/medical concerns Turns around,maneuvers to table,bed, and toilet,negotiates 3% grade,maneuvers  on rugs and over doorsills: No Function - Locomotion: Ambulation Assistive device: Walker-rolling Max distance: 40 Assist level: Moderate assist (Pt 50 - 74%) Walk 10 feet activity did not occur: Safety/medical concerns Assist level: Moderate assist (Pt 50 - 74%) Walk 50 feet with 2 turns activity did not occur: Safety/medical concerns Assist level: 2 helpers Walk 150 feet activity did not occur: Safety/medical concerns Walk 10 feet on uneven surfaces activity did not occur: Safety/medical concerns  Function - Comprehension Comprehension: Auditory Comprehension assist level: Follows basic conversation/direction with no assist  Function - Expression Expression: Verbal Expression assist level: Expresses basic 75 - 89% of the time/requires cueing 10 - 24% of the time. Needs helper to occlude trach/needs to repeat words.  Function - Social Interaction Social Interaction assist level: Interacts appropriately 90% of the time - Needs monitoring or encouragement for participation or interaction.  Function - Problem Solving Problem solving assist level: Solves basic 90% of the time/requires cueing < 10% of the time  Function - Memory Memory assist level: More than reasonable amount of time Patient normally able to recall (first 3 days only): That he or she is in a hospital  Medical Problem List and Plan: 1. Right hemiparesis as well as left side weakness/dysarthriasecondary to acute lacunar infarction involving the right medulla oblongata and medial aspect. Subacute large vessel left MCA infarct due to  intracranial stenosis-  Pt progressing slowly but has the potential to progress beyond wheelchair level. If we extend him an extra week. I think this would be beneficial for the patient's recovery. CIR PT OT SLP        2. DVT Prophylaxis/Anticoagulation: Subcutaneous Lovenox. Monitor platelet counts of any signs of bleeding 3. Pain Management/chronic back pain: Oxycodone as needed 4. Mood: Xanax 0.5 mg 3 times a day as needed 5. Neuropsych: This patient iscapable of making decisions on hisown behalf. 6. Skin/Wound Care: Routine skin checks 7. Fluids/Electrolytes/Nutrition: Routine I&O's with follow-up chemistries 8.Hyperlipidemia. Lipitor 9.Tobacco abuse counseling 10.Recent left ureteral stone. Has hematuria potentiated by anticoagulants, last Hgb is 14.8 on 9/79/14 13.7  Continue Flomax. Follow-up urology services, has stent placement, scheduled for removal which will be delayed by CVA, uro f/u as OP 11.Hypertension. Norvasc 5 mg daily. good control 9/20 Tachycardia has increased,  EKG is sinus, will start beta blocker,but will need to reduce norvasc Vitals:   04/26/17 1508 04/27/17 0515  BP:  125/78  Pulse: 100 (!) 120  Resp:  20  Temp: 98.6 F (37 C) 98.4 F (36.9 C)  SpO2:  97%  12.  Mild leukocytosis, afebrile 13.  Tachycardia- no anemia, volume status looks normal, EKG show sinus rhythm- will monitor  14.  Insomnia - remeron, His complaints are inconsistent. May need some relaxation strategies 15.  Pt reports no BM x 2 d, discussed constipation related to immobility 16.  Bladder incont with freq at noc no dysuria, likely spastic bladder  17.  Emotional lability vs adjustment d/o f/u neuropsych, pt does not want med management of this, no further labile episodes noted 18.  UTI start Keflex await cx- nitrite pos ? proteus  LOS (Days) 14 A FACE TO FACE EVALUATION WAS PERFORMED  Charli Liberatore E 04/27/2017, 7:22 AM

## 2017-04-27 NOTE — Patient Care Conference (Signed)
Inpatient RehabilitationTeam Conference and Plan of Care Update Date: 04/26/2017   Time: 11:05 AM    Patient Name: Brendan Charles      Medical Record Number: [id number]  Date of Birth: 09/16/1955 Sex: Male         Room/Bed: 4M07C/4M07C-01 Payor Info: Payor: Multimedia programmer / Plan: [employer] MEDICARE / Product Type: *No Product type* /    Admitting Diagnosis: cva  Admit Date/Time:  04/13/2017  3:08 PM Admission Comments: No comment available   Primary Diagnosis:  Spastic hemiparesis of left dominant side as late effect of cerebral infarction [facility]) Principal Problem: Spastic hemiparesis of left dominant side as late effect of cerebral infarction [facility])  Patient Active Problem List   Diagnosis Date Noted  . Spastic hemiparesis of left dominant side as late effect of cerebral infarction ([facility]) 04/19/2017  . Gait disturbance, post-stroke 04/18/2017  . Adjustment disorder with depressed mood   . Cerebral infarction due to thrombosis of right middle cerebral artery (HCC) 04/13/2017  . Weakness   . Acute ischemic stroke ([facility]CC) 04/12/2017  . Stroke (cerebrum) (HCC) 04/12/2017  . Acute CVA (cerebrovascular accident) ([facility]) 04/12/2017  . CVA (cerebral vascular accident) ([facility]) 03/20/2017  . Hydronephrosis, left 03/20/2017  . Aortic atherosclerosis ([facility]CC) 03/20/2017  . Hepatitis C 02/14/2017  . Encounter for screening colonoscopy 02/14/2017  . Spondylolisthesis at L3-L4 level 06/03/2014    Expected Discharge Date: Expected Discharge Date: 05/12/17  Team Members Present: Physician leading conference: Dr. Claudette Laws Social Worker Present: Staci Acosta, LCSW Nurse Present: Carlean Purl, RN PT Present: Teodoro Kil, PT OT Present: Kearney Hard, OT SLP Present: Feliberto Gottron, SLP PPS Coordinator present : Tora Duck, RN, CRRN     Current Status/Progress Goal Weekly Team Focus  Medical   repeat MBS minimal improvements, ok with D2,   improve swallow safety, decrease  fall risk  RMT, swallowing   Bowel/Bladder   incontinent of bowel and bladder  continet of bowel and bladder  assist with toileting needs q2hrs and prn   Swallow/Nutrition/ Hydration   Dys. 1 textures with nectar-thick liquids, water protocol, Mod I   Mod I  water protocol, trials of upgraded textures    ADL's   min/mod A overall  Supervision/Mod I goals  postural control, siting/standing balance, modified bathing/dressing, activity tolerance   Mobility   min/mod for transfers, mod for gait with RW, very poor trunk control in sitting/standing  supervision overall, downgrade gait/stairs to min assist  NMR, balance, gait, transfers, d/c planning   Communication   Mod A  Min A  speech intelligibility strategies, RMT   Safety/Cognition/ Behavioral Observations            Pain   no c/o pain  no c/o pain  Assess pain qshift and prn   Skin   no skin issues  no skin issues  Assess skin q shift and prn    Rehab Goals Patient on target to meet rehab goals: Yes Rehab Goals Revised: therapists are asking for more time to help pt progress to highest functional level.  Gait goals and stair goals downgraded to min A. *See Care Plan and progress notes for long and short-term goals.     Barriers to Discharge  Current Status/Progress Possible Resolutions Date Resolved   Physician    Inaccessible home environment;Medical stability;Behavior  intermittent decreased compliance, incont bowel and bladder  progressing  bowel and bladder program      Nursing  PT                    OT                  SLP                SW                Discharge Planning/Teaching Needs:  Home with sons working on piecing together a 24/7 supervision plan among family members.  Pt's family will be offered family education closer to d/c.   Team Discussion:  Per ST MBS was better, but pt's swallow is still inconsistent, so he is on nectar thick liquids and D2 foods with water protocol with cup.   ST to do repeat MBS prior to d/c.  Pt is progressing with PT, but still mod A with walker and is unsteady still with standing tasks.  Pt has no core strength and is min to mod A with ADLs.  Pt still with incontinence, especially at night.  Nursing is addressing this with timed toileting program.  Revisions to Treatment Plan:  Therapists requesting more time with pt to work toward upgrading diet; increasing strength, balance, activity tolerance; and to work on ADLs/toileting with the plan for more independence for pt.    Continued Need for Acute Rehabilitation Level of Care: The patient requires daily medical management by a physician with specialized training in physical medicine and rehabilitation for the following conditions: Daily direction of a multidisciplinary physical rehabilitation program to ensure safe treatment while eliciting the highest outcome that is of practical value to the patient.: Yes Daily medical management of patient stability for increased activity during participation in an intensive rehabilitation regime.: Yes Daily analysis of laboratory values and/or radiology reports with any subsequent need for medication adjustment of medical intervention for : Neurological problems;Mood/behavior problems;Urological problems  Coleman Kalas, [place] 04/27/2017, 10:04 AM

## 2017-04-27 NOTE — Progress Notes (Signed)
Physical Therapy Note  Patient Details  Name: Brendan Charles MRN: [id number] Date of Birth: Dec 06, 1955 Today's Date: 04/27/2017    Per RN, pt on hold for therapies today.  New diagnosis of UTI, axillary temp 103.  Will f/u per POC.   Estill Dooms, PT, DPT    04/27/2017, 10:47 AM

## 2017-04-27 NOTE — Progress Notes (Signed)
Pharmacy Antibiotic Note  Brendan Charles is a 61 y.o. male admitted on 04/13/2017 with CVA.  Temp to 103.3, pt having chills, sweating. Pharmacy has been consulted for Ceftriaxone dosing for UTI.  Plan: Ceftriaxone 1g IV q24h No adjustments in ceftriaxone needed for renal function changes. Pharmacy will sign off.   Height:  (180.3 cm) Weight: 157 lb (71.2 kg) IBW/kg (Calculated) : 75.3  Temp (24hrs), Avg:99.6 F (37.6 C), Min:98.4 F (36.9 C), Max:103.3 F (39.6 C)   Recent Labs Lab 04/21/17 0759 04/27/17 0530  WBC 10.4  --   CREATININE  --  1.03    Estimated Creatinine Clearance: 74.9 mL/min (by C-G formula based on SCr of 1.03 mg/dL).    Allergies  Allergen Reactions  . Codeine Itching and Nausea Only    Antimicrobials this admission: Keflex po 9/20 >>x 1 dose Ceftriaxone 9/20 >>   Dose adjustments this admission: n/a  Microbiology results: 9/19 BCx: sent 9/19 UCx: sent   Thank you for allowing pharmacy to be a part of this patient's care.  Noah Delaine, RPh Clinical Pharmacist 04/27/2017 9:58 AM

## 2017-04-27 NOTE — Progress Notes (Signed)
Noted axillary temperature 103. Urine study positive nitrite Keflex was initiated. We will now obtain blood cultures change antibiotics to intravenous Rocephin check chest x-ray as well as CBC and BMET

## 2017-04-27 NOTE — Progress Notes (Addendum)
Occupational Therapy Session Note  Patient Details  Name: Brendan Charles MRN: [id number] Date of Birth: [birth year]  Today's Date: 04/27/2017 OT Individual Time: 0800-0900 OT Individual Time Calculation (min): 60 min    Short Term Goals: Week 2:  OT Short Term Goal 1 (Week 2): Pt will complete toilet transfers with S. OT Short Term Goal 2 (Week 2): Pt will complete 3/3 toileting tasks with S. OT Short Term Goal 3 (Week 2): Pt will be able to stand at sink with S to perform grooming tasks. OT Short Term Goal 4 (Week 2): Pt will use AE for bathing LB to increase safety with balance. OT Short Term Goal 5 (Week 2): Pt will use AE for donning socks and pants using his LUE as an active assist.  Skilled Therapeutic Interventions/Progress Updates:    Pt greeted in bed eating breakfast. Pt reported he felt much better this morning. Pt transferred to sitting EOB with min A to advance LEs, and Min/Mod A to elevate trunk from flat surface. Addressed sitting balance, and B UE coordination with self feeding task. Pt with slight difficulty maintaining grasp on feeding utensil. Pt needed Max A to maintain sitting balance when using B UEs to assist food onto spoon, Mod A overall with unilateral UE support with intermittent bouts of min A. Multimodal cues to maintain anterior weight shift. Pt with difficulty powering up today requiring Mod/Max A for funtional toilet and shower transfers. Pt with successful BM and able to complete peri-care with min/mod A for sitting balance while reaching behind. Continued to address upright posture and B UE coordination with bathing tasks. Pt needed max A to transfer out of shower 2/2 being cold and muscles tense. Max/total A for LB dressing 2/2 time management. Pt unable to warm up and slow breathing down despite multiple blankets, heating pad, and environmental changes, so pt transferred back to bed with Max A. Informed nurse tech and oncomming PT of pt status.   Therapy  Documentation Precautions:  Precautions Precautions: Fall Precaution Comments: new L hemiparesis; recent CVA with R sided weakness Restrictions Weight Bearing Restrictions: No Pain:  none/denies pain ADL: ADL ADL Comments: Please see functional navigator  See Function Navigator for Current Functional Status.   Therapy/Group: Individual Therapy  Mal Amabile 04/27/2017, 8:27 AM

## 2017-04-27 NOTE — Progress Notes (Signed)
Temp 103.3 axillary with pt having chills, sweating, and hot to touch. Notified Harvel Ricks, PA with finding. STAT chest xray ordered, STAT Bmet and CBC, STAT blood cultures x 2, IV fluids 0.45NS 67ml/hr cont., and IV rocephin consult sent to pharmacy. Pt newly diagnosis of UTI. RN to continue to assess and monitor status of patient.

## 2017-04-27 NOTE — Progress Notes (Signed)
SLP Cancellation Note  Patient Details Name: RANDOL ZUMSTEIN MRN: [id number] DOB: 1955/11/09   Cancelled treatment:       Per RN, pt on hold for therapies today.  New diagnosis of UTI, axillary temp 103.  Will f/u per POC.                                                                                                 Vista Sawatzky 04/27/2017, 11:33 AM

## 2017-04-28 ENCOUNTER — Inpatient Hospital Stay (HOSPITAL_COMMUNITY): Payer: Medicare Other | Admitting: Occupational Therapy

## 2017-04-28 ENCOUNTER — Inpatient Hospital Stay (HOSPITAL_COMMUNITY): Payer: Medicare Other | Admitting: Speech Pathology

## 2017-04-28 ENCOUNTER — Inpatient Hospital Stay (HOSPITAL_COMMUNITY): Payer: Medicare Other | Admitting: Physical Therapy

## 2017-04-28 LAB — BLOOD CULTURE ID PANEL (REFLEXED)
ACINETOBACTER BAUMANNII: NOT DETECTED
CANDIDA ALBICANS: NOT DETECTED
CANDIDA PARAPSILOSIS: NOT DETECTED
Candida glabrata: NOT DETECTED
Candida krusei: NOT DETECTED
Candida tropicalis: NOT DETECTED
Carbapenem resistance: NOT DETECTED
ENTEROBACTER CLOACAE COMPLEX: NOT DETECTED
ENTEROCOCCUS SPECIES: NOT DETECTED
Enterobacteriaceae species: DETECTED — AB
Escherichia coli: DETECTED — AB
HAEMOPHILUS INFLUENZAE: NOT DETECTED
Klebsiella oxytoca: NOT DETECTED
Klebsiella pneumoniae: NOT DETECTED
Listeria monocytogenes: NOT DETECTED
Neisseria meningitidis: NOT DETECTED
PSEUDOMONAS AERUGINOSA: NOT DETECTED
Proteus species: NOT DETECTED
STREPTOCOCCUS AGALACTIAE: NOT DETECTED
STREPTOCOCCUS PYOGENES: NOT DETECTED
STREPTOCOCCUS SPECIES: NOT DETECTED
Serratia marcescens: NOT DETECTED
Staphylococcus aureus (BCID): NOT DETECTED
Staphylococcus species: NOT DETECTED
Streptococcus pneumoniae: NOT DETECTED

## 2017-04-28 LAB — CBC
HCT: 36 % — ABNORMAL LOW (ref 39.0–52.0)
Hemoglobin: 12.1 g/dL — ABNORMAL LOW (ref 13.0–17.0)
MCH: 30.6 pg (ref 26.0–34.0)
MCHC: 33.6 g/dL (ref 30.0–36.0)
MCV: 90.9 fL (ref 78.0–100.0)
PLATELETS: 205 10*3/uL (ref 150–400)
RBC: 3.96 MIL/uL — ABNORMAL LOW (ref 4.22–5.81)
RDW: 14.2 % (ref 11.5–15.5)
WBC: 16.6 10*3/uL — AB (ref 4.0–10.5)

## 2017-04-28 LAB — URINE CULTURE

## 2017-04-28 MED ORDER — SODIUM CHLORIDE 0.45 % IV SOLN
INTRAVENOUS | Status: DC
  Administered 2017-04-28 – 2017-05-01 (×4): via INTRAVENOUS

## 2017-04-28 MED ORDER — DEXTROSE 5 % IV SOLN
2.0000 g | INTRAVENOUS | Status: DC
  Administered 2017-04-28 – 2017-05-02 (×5): 2 g via INTRAVENOUS
  Filled 2017-04-28 (×5): qty 2

## 2017-04-28 NOTE — Progress Notes (Signed)
Speech Language Pathology Weekly Progress Note  Patient Details  Name: Brendan Charles MRN: [id number] Date of Birth: 1955-10-31  Beginning of progress report period: April 20, 2017 End of progress report period: April 28, 2017  Today's Date: 04/28/2017  Short Term Goals: Week 2: SLP Short Term Goal 1 (Week 2): Patient will perform pharyngeal strengthening exercises with supervision verbal cues for accuracy.  SLP Short Term Goal 1 - Progress (Week 2): Not met SLP Short Term Goal 2 (Week 2): Patient will perform 25 repetitions of EMST/IMST excerises with Mod A verbal cues for accuracy and a self-percieved effort level of 8/10.  SLP Short Term Goal 2 - Progress (Week 2): Met SLP Short Term Goal 3 (Week 2): Patient will consume trials of thin liquids via tsp without overt s/s of aspiration with supervision verbal cues for use of swallowing strategies to assess readiness for repeat MBS.  SLP Short Term Goal 3 - Progress (Week 2): Met SLP Short Term Goal 4 (Week 2): Patient will utilize speech intelligibility strategies to maximize speech intelligibility at the phrase level to 75% with Min A verbal cues. SLP Short Term Goal 4 - Progress (Week 2): Met    New Short Term Goals: Week 3: SLP Short Term Goal 1 (Week 3): Patient will perform pharyngeal strengthening exercises with supervision verbal cues for accuracy.  SLP Short Term Goal 2 (Week 3): Patient will perform 25 repetitions of EMST/IMST excerises with Min A verbal cues for accuracy and a self-percieved effort level of 8/10.  SLP Short Term Goal 3 (Week 3): Patient will utilize speech intelligibility strategies to maximize speech intelligibility at the phrase level to 75% with supervision verbal cues. SLP Short Term Goal 4 (Week 3): Patient will consume trials of thin liquids via cup without overt s/s of aspiration with supervision verbal cues for use of swallowing strategies to assess readiness for repeat MBS.  SLP Short Term Goal  5 (Week 3): Patient will demonstrate efficent mastication of Dys. 2 textures without overt s/s of aspiration with Min A verbal cues over 2 sessions prior to upgrade.   Weekly Progress Updates: Patient has made functional gains and has met 3 of 4 STG's this reporting period. Currently, patient is consuming Dys. 1 textures with nectar-thick liquids without overt s/s of aspiration with intermittent supervision. Patient had repeat MBS on 9/19 which showed improvement, however, patient recommended to continue current diet with trials of Dys. 2 textures and water protocol via cup. Patient demonstrates increased ability to perform IMST/EMST exercises with decreased effort level and requires overall Min A verbal cues for use of speech intelligibility strategies at the phrase and sentence level to achieve ~75% intelligibility. Patient and family education is ongoing. Patient would benefit from continued skilled SLP intervention to maximize his swallowing function and functional communication prior to discharge.      Intensity: Minumum of 1-2 x/day, 30 to 90 minutes Frequency: 3 to 5 out of 7 days Duration/Length of Stay: 05/12/17 Treatment/Interventions: English as a second language teacher;Dysphagia/aspiration precaution training;Environmental controls;Functional tasks;Patient/family education;Therapeutic Activities;Speech/Language facilitation;Internal/external aids   Brendan Charles 04/28/2017, 12:31 PM

## 2017-04-28 NOTE — Progress Notes (Signed)
PHARMACY - PHYSICIAN COMMUNICATION CRITICAL VALUE ALERT - BLOOD CULTURE IDENTIFICATION (BCID)  Results for orders placed or performed during the hospital encounter of 04/13/17  Blood Culture ID Panel (Reflexed) (Collected: 04/27/2017 11:05 AM)  Result Value Ref Range   Enterococcus species NOT DETECTED NOT DETECTED   Listeria monocytogenes NOT DETECTED NOT DETECTED   Staphylococcus species NOT DETECTED NOT DETECTED   Staphylococcus aureus NOT DETECTED NOT DETECTED   Streptococcus species NOT DETECTED NOT DETECTED   Streptococcus agalactiae NOT DETECTED NOT DETECTED   Streptococcus pneumoniae NOT DETECTED NOT DETECTED   Streptococcus pyogenes NOT DETECTED NOT DETECTED   Acinetobacter baumannii NOT DETECTED NOT DETECTED   Enterobacteriaceae species DETECTED (A) NOT DETECTED   Enterobacter cloacae complex NOT DETECTED NOT DETECTED   Escherichia coli DETECTED (A) NOT DETECTED   Klebsiella oxytoca NOT DETECTED NOT DETECTED   Klebsiella pneumoniae NOT DETECTED NOT DETECTED   Proteus species NOT DETECTED NOT DETECTED   Serratia marcescens NOT DETECTED NOT DETECTED   Carbapenem resistance NOT DETECTED NOT DETECTED   Haemophilus influenzae NOT DETECTED NOT DETECTED   Neisseria meningitidis NOT DETECTED NOT DETECTED   Pseudomonas aeruginosa NOT DETECTED NOT DETECTED   Candida albicans NOT DETECTED NOT DETECTED   Candida glabrata NOT DETECTED NOT DETECTED   Candida krusei NOT DETECTED NOT DETECTED   Candida parapsilosis NOT DETECTED NOT DETECTED   Candida tropicalis NOT DETECTED NOT DETECTED    Name of physician (or Provider) Contacted: Dr Wynn Banker  Changes to prescribed antibiotics required: Will increase Rocephin to 2g IV Q24H.  Vernard Gambles, PharmD, BCPS  04/28/2017  7:50 AM

## 2017-04-28 NOTE — Progress Notes (Signed)
Occupational Therapy Session Note  Patient Details  Name: Brendan Charles MRN: [id number] Date of Birth: 1955/08/10  Today's Date: 04/28/2017 OT Individual Time: 0900-1000 OT Individual Time Calculation (min): 60 min    Short Term Goals: Week 2:  OT Short Term Goal 1 (Week 2): Pt will complete toilet transfers with S. OT Short Term Goal 2 (Week 2): Pt will complete 3/3 toileting tasks with S. OT Short Term Goal 3 (Week 2): Pt will be able to stand at sink with S to perform grooming tasks. OT Short Term Goal 4 (Week 2): Pt will use AE for bathing LB to increase safety with balance. OT Short Term Goal 5 (Week 2): Pt will use AE for donning socks and pants using his LUE as an active assist.  Skilled Therapeutic Interventions/Progress Updates:    Pt greeted semi-reclined in bed. He stated he did not feel much better, but medical status has improved. Pt needed encouragement to participate initially, but agreeable to try. Worked on bed level LB dressing to don socks. Educated pt on LB figure 4 positioning with assistance needed to maintain position 2/2 hip weakness. HOB also raised to provide some tunk support while pt engaged abdominals to reach and thread socks. Pt with hand weakness and coordination deficits requiring increased time and multiple trials to pull socks all the way up. Pt transferred to sitting with min A to elevate trunk from elevated HOB.  Achieved sitting balance with min A and reported. Dizziness in sitting. Utilized point of focus to decrease dizziness with improved status. Squat-pivot transfer to L with min A. UB dressing seated in wc with NDT techniques utilized to maintain upright position while threading sleeves. Pt with lateral L and anterior LOB when threading shirt needing mod A to correct. LB dressing with mod A today 2/2 increased dizziness with bending forward. Sit<>stand with Mod A and assistance to pull pants over hips. Extended rest break after standing. Pt then brushed  his teeth seated at the sink with increased time and effort 2/2 weakness. Administered water protocol of 8 sips without cough. Encouraged pt to stay up out of bed, but he refused despite education. Stand-pivot back to bed with Mod A. Pt left in supine with nurse present.  Therapy Documentation Precautions:  Precautions Precautions: Fall Precaution Comments: new L hemiparesis; recent CVA with R sided weakness Restrictions Weight Bearing Restrictions: No Pain:  no pain ADL: ADL ADL Comments: Please see functional navigator  See Function Navigator for Current Functional Status.   Therapy/Group: Individual Therapy  Mal Amabile 04/28/2017, 9:25 AM

## 2017-04-28 NOTE — Progress Notes (Signed)
Social Work Patient ID: Brendan Charles, male   DOB: 1956-06-21, 61 y.o.   MRN: [id number]   CSW met with pt and his son 04-27-17 to update them on team conference discussion.  Explained that MD and therapists feel pt would benefit from an extra week on CIR and pt/son agreed.  New targeted d/c date is 05-12-17.  Pt with UTI and was very emotional at time of CSW visit.  CSW offered support to pt and his son.  Son with medical questions and Marlowe Shores, PA came in during visit and answered these.  CSW will continue to follow and assist as needed.

## 2017-04-28 NOTE — Progress Notes (Signed)
Subjective/Complaints:   Sweats/chills resolved no further pain with urination, only had dysuria X 1d   ROS- neg CP, SOB, N/V/D  Objective: Vital Signs: Blood pressure 129/78, pulse (!) 117, temperature 97.8 F (36.6 C), temperature source Oral, resp. rate 16, height 5\' 11"  (1.803 m), weight 71.2 kg (157 lb), SpO2 97 %. Dg Chest Port 1 View  Result Date: 04/27/2017 CLINICAL DATA:  Fever, chills EXAM: PORTABLE CHEST 1 VIEW COMPARISON:  10/22/2014 FINDINGS: Heart and mediastinal contours are within normal limits. No focal opacities or effusions. No acute bony abnormality. IMPRESSION: No active disease. Electronically Signed   By: 10/24/2014 M.D.   On: 04/27/2017 09:54   Dg Swallowing Func-speech Pathology  Result Date: 04/26/2017 Objective Swallowing Evaluation: Type of Study: MBS-Modified Barium Swallow Study Patient Details Name: Brendan Charles MRN: [name] Date of Birth: 01-26-1956 Today's Date: 04/26/2017 Time: SLP Start Time (ACUTE ONLY): 0905-SLP Stop Time (ACUTE ONLY): 0935 SLP Time Calculation (min) (ACUTE ONLY): 30 min Past Medical History: Past Medical History: Diagnosis Date . Anxiety  . Chronic back pain  . CVA (cerebral vascular accident) (HCC) 03/20/2017 . Hepatitis C   HEP C, never treated, 2007 Past Surgical History: Past Surgical History: Procedure Laterality Date . CERVICAL DISC SURGERY  2001  anterior . COLONOSCOPY WITH PROPOFOL N/A 03/14/2017  Procedure: COLONOSCOPY WITH PROPOFOL;  Surgeon: 05/14/2017, MD;  Location: [facility];  Service: Endoscopy;  Laterality: N/A;  8:15am . CYSTOSCOPY W/ URETERAL STENT PLACEMENT Left 03/23/2017  Procedure: CYSTOSCOPY WITH LEFT RETROGRADE PYELOGRAM/URETERAL LEFT STENT PLACEMENT;  Surgeon: 03/25/2017, MD;  Location: [facility];  Service: Urology;  Laterality: Left; . LUMBAR SPINE SURGERY  2015 . POLYPECTOMY  03/14/2017  Procedure: POLYPECTOMY;  Surgeon: 05/14/2017, MD;  Location: [facility];  Service: Endoscopy;;  rectum  HPI: See H&P  Subjective: "I had a hard time chewing today." Assessment / Plan / Recommendation CHL IP CLINICAL IMPRESSIONS 04/26/2017 Clinical Impression Patient demonstrates a moderate oropharyngeal dysphagia that appears mildly improved since initial MBS. Oral phase is characterized by poor oral control and bolus cohesion leading to premature spillage of all consistencies.  Patient's pharyngeal phase is characterized by delayed swallow initiation with decreased base of tongue retraction.  Impairments listed above result in intermittent silent penetration and aspiration of thin liquids via tsp and cup. However, amount of aspirates and episodes of aspiration have decreased since initial MBS. Although patient demonstrated increased cough strength, it was not strong enough to expectorate aspirates. Patient demonstrated efficient mastication of Dys. 2 textures but demonstrated moderate vallecular residue that was reduced with multiple swallows. However, patient required extra time and increased effort to initiate second swallow. For now, recommend patient remain on current diet with trials of Dys. 2 textures with SLP and continue water protocol via cup. Also recommend patient continue RMT and pharyngeal strengthening exercises. Discussed results of MBS with the patient and he verbalized understanding and agreement. SLP Visit Diagnosis Dysphagia, oropharyngeal phase (R13.12) Attention and concentration deficit following -- Frontal lobe and executive function deficit following -- Impact on safety and function Moderate aspiration risk;Severe aspiration risk   CHL IP TREATMENT RECOMMENDATION 04/26/2017 Treatment Recommendations Therapy as outlined in treatment plan below   Prognosis 04/26/2017 Prognosis for Safe Diet Advancement Good Barriers to Reach Goals Severity of deficits Barriers/Prognosis Comment -- CHL IP DIET RECOMMENDATION 04/26/2017 SLP Diet Recommendations Dysphagia 1 (Puree) solids;Free water protocol after oral  care;Nectar thick liquid Liquid Administration via Cup Medication Administration Crushed with puree  Compensations Slow rate;Follow solids with liquid;Small sips/bites;Minimize environmental distractions;Multiple dry swallows after each bite/sip;Clear throat intermittently Postural Changes Seated upright at 90 degrees   CHL IP OTHER RECOMMENDATIONS 04/26/2017 Recommended Consults -- Oral Care Recommendations Oral care BID Other Recommendations Order thickener from pharmacy;Have oral suction available;Prohibited food (jello, ice cream, thin soups);Remove water pitcher   CHL IP FOLLOW UP RECOMMENDATIONS 04/26/2017 Follow up Recommendations Inpatient Rehab   CHL IP FREQUENCY AND DURATION 04/26/2017 Speech Therapy Frequency (ACUTE ONLY) min 5x/week Treatment Duration 2 weeks      CHL IP ORAL PHASE 04/26/2017 Oral Phase -- Oral - Pudding Teaspoon -- Oral - Pudding Cup -- Oral - Honey Teaspoon NT Oral - Honey Cup NT Oral - Nectar Teaspoon NT Oral - Nectar Cup NT Oral - Nectar Straw -- Oral - Thin Teaspoon Weak lingual manipulation;Reduced posterior propulsion;Delayed oral transit;Decreased bolus cohesion;Premature spillage Oral - Thin Cup Weak lingual manipulation;Delayed oral transit;Decreased bolus cohesion;Premature spillage;Reduced posterior propulsion Oral - Thin Straw -- Oral - Puree Weak lingual manipulation;Reduced posterior propulsion;Premature spillage;Decreased bolus cohesion;Delayed oral transit Oral - Mech Soft Impaired mastication;Delayed oral transit;Decreased bolus cohesion;Reduced posterior propulsion Oral - Regular -- Oral - Multi-Consistency -- Oral - Pill -- Oral Phase - Comment --  CHL IP PHARYNGEAL PHASE 04/26/2017 Pharyngeal Phase Impaired Pharyngeal- Pudding Teaspoon -- Pharyngeal -- Pharyngeal- Pudding Cup -- Pharyngeal -- Pharyngeal- Honey Teaspoon NT Pharyngeal -- Pharyngeal- Honey Cup -- Pharyngeal -- Pharyngeal- Nectar Teaspoon NT Pharyngeal -- Pharyngeal- Nectar Cup NT Pharyngeal -- Pharyngeal-  Nectar Straw -- Pharyngeal -- Pharyngeal- Thin Teaspoon Penetration/Aspiration during swallow;Delayed swallow initiation-vallecula;Reduced anterior laryngeal mobility;Reduced tongue base retraction;Pharyngeal residue - valleculae Pharyngeal Material enters airway, passes BELOW cords and not ejected out despite cough attempt by patient Pharyngeal- Thin Cup Penetration/Aspiration during swallow;Delayed swallow initiation-pyriform sinuses;Pharyngeal residue - valleculae;Pharyngeal residue - pyriform;Reduced anterior laryngeal mobility;Reduced tongue base retraction Pharyngeal Material enters airway, passes BELOW cords without attempt by patient to eject out (silent aspiration);Material enters airway, remains ABOVE vocal cords and not ejected out Pharyngeal- Thin Straw -- Pharyngeal -- Pharyngeal- Puree Delayed swallow initiation-vallecula;Reduced tongue base retraction;Pharyngeal residue - valleculae Pharyngeal -- Pharyngeal- Mechanical Soft Delayed swallow initiation-vallecula;Pharyngeal residue - valleculae Pharyngeal -- Pharyngeal- Regular -- Pharyngeal -- Pharyngeal- Multi-consistency -- Pharyngeal -- Pharyngeal- Pill -- Pharyngeal -- Pharyngeal Comment --  CHL IP CERVICAL ESOPHAGEAL PHASE 04/14/2017 Cervical Esophageal Phase Impaired Pudding Teaspoon -- Pudding Cup -- Honey Teaspoon -- Honey Cup -- Nectar Teaspoon -- Nectar Cup -- Nectar Straw -- Thin Teaspoon -- Thin Cup -- Thin Straw -- Puree -- Mechanical Soft -- Regular -- Multi-consistency -- Pill -- Cervical Esophageal Comment -- No flowsheet data found. PAYNE, [place] 04/26/2017, 3:49 PM              Results for orders placed or performed during the hospital encounter of 04/13/17 (from the past 72 hour(s))  Urinalysis, Routine w reflex microscopic     Status: Abnormal   Collection Time: 04/26/17 12:59 PM  Result Value Ref Range   Color, Urine RED (A) YELLOW    Comment: BIOCHEMICALS MAY BE AFFECTED BY COLOR   APPearance CLOUDY (A) CLEAR   Specific  Gravity, Urine 1.020 1.005 - 1.030   pH 6.5 5.0 - 8.0   Glucose, UA 100 (A) NEGATIVE mg/dL   Hgb urine dipstick LARGE (A) NEGATIVE   Bilirubin Urine MODERATE (A) NEGATIVE   Ketones, ur 15 (A) NEGATIVE mg/dL   Protein, ur >300 (A) NEGATIVE mg/dL   Nitrite POSITIVE (A) NEGATIVE   Leukocytes, UA LARGE (  A) NEGATIVE  Urinalysis, Microscopic (reflex)     Status: Abnormal   Collection Time: 04/26/17 12:59 PM  Result Value Ref Range   RBC / HPF TOO NUMEROUS TO COUNT 0 - 5 RBC/hpf   WBC, UA TOO NUMEROUS TO COUNT 0 - 5 WBC/hpf   Bacteria, UA MANY (A) NONE SEEN   Squamous Epithelial / LPF NONE SEEN NONE SEEN  Urine Culture     Status: Abnormal (Preliminary result)   Collection Time: 04/26/17  2:51 PM  Result Value Ref Range   Specimen Description URINE, RANDOM    Special Requests NONE    Culture >=100,000 COLONIES/mL ESCHERICHIA COLI (A)    Report Status PENDING   Creatinine, serum     Status: None   Collection Time: 04/27/17  5:30 AM  Result Value Ref Range   Creatinine, Ser 1.03 0.61 - 1.24 mg/dL   GFR calc non Af Amer >60 >60 mL/min   GFR calc Af Amer >60 >60 mL/min    Comment: (NOTE) The eGFR has been calculated using the CKD EPI equation. This calculation has not been validated in all clinical situations. eGFR's persistently <60 mL/min signify possible Chronic Kidney Disease.   Basic metabolic panel     Status: Abnormal   Collection Time: 04/27/17 10:47 AM  Result Value Ref Range   Sodium 134 (L) 135 - 145 mmol/L   Potassium 3.5 3.5 - 5.1 mmol/L   Chloride 102 101 - 111 mmol/L   CO2 19 (L) 22 - 32 mmol/L   Glucose, Bld 140 (H) 65 - 99 mg/dL   BUN 23 (H) 6 - 20 mg/dL   Creatinine, Ser 1.24 0.61 - 1.24 mg/dL   Calcium 9.0 8.9 - 10.3 mg/dL   GFR calc non Af Amer >60 >60 mL/min   GFR calc Af Amer >60 >60 mL/min    Comment: (NOTE) The eGFR has been calculated using the CKD EPI equation. This calculation has not been validated in all clinical situations. eGFR's persistently <60  mL/min signify possible Chronic Kidney Disease.    Anion gap 13 5 - 15  CBC with Differential/Platelet     Status: Abnormal   Collection Time: 04/27/17 10:47 AM  Result Value Ref Range   WBC 19.3 (H) 4.0 - 10.5 K/uL   RBC 4.36 4.22 - 5.81 MIL/uL   Hemoglobin 13.1 13.0 - 17.0 g/dL   HCT 39.7 39.0 - 52.0 %   MCV 91.1 78.0 - 100.0 fL   MCH 30.0 26.0 - 34.0 pg   MCHC 33.0 30.0 - 36.0 g/dL   RDW 14.1 11.5 - 15.5 %   Platelets 246 150 - 400 K/uL   Neutrophils Relative % 93 %   Neutro Abs 18.0 (H) 1.7 - 7.7 K/uL   Lymphocytes Relative 3 %   Lymphs Abs 0.6 (L) 0.7 - 4.0 K/uL   Monocytes Relative 4 %   Monocytes Absolute 0.7 0.1 - 1.0 K/uL   Eosinophils Relative 0 %   Eosinophils Absolute 0.0 0.0 - 0.7 K/uL   Basophils Relative 0 %   Basophils Absolute 0.0 0.0 - 0.1 K/uL  Culture, blood (Routine X 2) w Reflex to ID Panel     Status: None (Preliminary result)   Collection Time: 04/27/17 11:02 AM  Result Value Ref Range   Specimen Description BLOOD LEFT HAND    Special Requests IN PEDIATRIC BOTTLE Blood Culture adequate volume    Culture  Setup Time      GRAM NEGATIVE RODS IN PEDIATRIC BOTTLE  CRITICAL VALUE NOTED.  VALUE IS CONSISTENT WITH PREVIOUSLY REPORTED AND CALLED VALUE.    Culture GRAM NEGATIVE RODS    Report Status PENDING   Culture, blood (Routine X 2) w Reflex to ID Panel     Status: None (Preliminary result)   Collection Time: 04/27/17 11:05 AM  Result Value Ref Range   Specimen Description BLOOD RIGHT HAND    Special Requests IN PEDIATRIC BOTTLE Blood Culture adequate volume    Culture  Setup Time      GRAM NEGATIVE RODS IN PEDIATRIC BOTTLE Organism ID to follow CRITICAL RESULT CALLED TO, READ BACK BY AND VERIFIED WITH: V. BRYK AT [id number] 04/28/17 BY D. VANHOOK    Culture GRAM NEGATIVE RODS    Report Status PENDING   Blood Culture ID Panel (Reflexed)     Status: Abnormal   Collection Time: 04/27/17 11:05 AM  Result Value Ref Range   Enterococcus species NOT DETECTED  NOT DETECTED   Listeria monocytogenes NOT DETECTED NOT DETECTED   Staphylococcus species NOT DETECTED NOT DETECTED   Staphylococcus aureus NOT DETECTED NOT DETECTED   Streptococcus species NOT DETECTED NOT DETECTED   Streptococcus agalactiae NOT DETECTED NOT DETECTED   Streptococcus pneumoniae NOT DETECTED NOT DETECTED   Streptococcus pyogenes NOT DETECTED NOT DETECTED   Acinetobacter baumannii NOT DETECTED NOT DETECTED   Enterobacteriaceae species DETECTED (A) NOT DETECTED    Comment: Enterobacteriaceae represent a large family of gram-negative bacteria, not a single organism. CRITICAL RESULT CALLED TO, READ BACK BY AND VERIFIED WITH: V. BRYK AT [id number] 04/28/17 BY D. VANHOOK    Enterobacter cloacae complex NOT DETECTED NOT DETECTED   Escherichia coli DETECTED (A) NOT DETECTED    Comment: CRITICAL RESULT CALLED TO, READ BACK BY AND VERIFIED WITH: V. BRYK AT 7867 04/28/17 BY D. VANHOOK    Klebsiella oxytoca NOT DETECTED NOT DETECTED   Klebsiella pneumoniae NOT DETECTED NOT DETECTED   Proteus species NOT DETECTED NOT DETECTED   Serratia marcescens NOT DETECTED NOT DETECTED   Carbapenem resistance NOT DETECTED NOT DETECTED   Haemophilus influenzae NOT DETECTED NOT DETECTED   Neisseria meningitidis NOT DETECTED NOT DETECTED   Pseudomonas aeruginosa NOT DETECTED NOT DETECTED   Candida albicans NOT DETECTED NOT DETECTED   Candida glabrata NOT DETECTED NOT DETECTED   Candida krusei NOT DETECTED NOT DETECTED   Candida parapsilosis NOT DETECTED NOT DETECTED   Candida tropicalis NOT DETECTED NOT DETECTED  CBC     Status: Abnormal   Collection Time: 04/28/17  4:33 AM  Result Value Ref Range   WBC 16.6 (H) 4.0 - 10.5 K/uL   RBC 3.96 (L) 4.22 - 5.81 MIL/uL   Hemoglobin 12.1 (L) 13.0 - 17.0 g/dL   HCT 54.4 (L) 92.0 - 10.0 %   MCV 90.9 78.0 - 100.0 fL   MCH 30.6 26.0 - 34.0 pg   MCHC 33.6 30.0 - 36.0 g/dL   RDW 71.2 19.7 - 58.8 %   Platelets 205 150 - 400 K/uL     HEENT: normal and  dysarthric Cardio: RRR and no murmur Resp: CTA B/L and unlabored GI: BS positive and NT, ND Extremity:  Pulses positive and No Edema Skin:   Intact Neuro: Alert/Oriented, Cranial Nerve Abnormalities Left central VII, Normal Sensory, Abnormal Motor 3- Left delt, Bi, tri, finger flexor/ext, hip/knee extensor synergy, 2-/5 at ankle, 4/5 in RUE and RLE   and Dysarthric Musc/Skel:  Other no pain with UE or LE ROM Gen NAD   Assessment/Plan: 1. Functional deficits  secondary to acute left hemiparesis, subacute right hemiparesis which require 3+ hours per day of interdisciplinary therapy in a comprehensive inpatient rehab setting. Physiatrist is providing close team supervision and 24 hour management of active medical problems listed below. Physiatrist and rehab team continue to assess barriers to discharge/monitor patient progress toward functional and medical goals. FIM: Function - Bathing Position: Shower Body parts bathed by patient: Right arm, Left arm, Chest, Abdomen, Front perineal area, Left upper leg, Right upper leg, Buttocks, Right lower leg, Left lower leg Body parts bathed by helper: Back Assist Level: Touching or steadying assistance(Pt > 75%), Assistive device Assistive Device Comment: long handled sponge  Function- Upper Body Dressing/Undressing What is the patient wearing?: Pull over shirt/dress Pull over shirt/dress - Perfomed by patient: Thread/unthread right sleeve, Thread/unthread left sleeve, Put head through opening, Pull shirt over trunk Pull over shirt/dress - Perfomed by helper: Thread/unthread left sleeve, Pull shirt over trunk Assist Level: Supervision or verbal cues Function - Lower Body Dressing/Undressing What is the patient wearing?: Pants, Non-skid slipper socks Position: Wheelchair/chair at sink Pants- Performed by patient: Thread/unthread right pants leg, Thread/unthread left pants leg Pants- Performed by helper: Pull pants up/down Non-skid slipper socks-  Performed by patient: Don/doff left sock, Don/doff right sock Non-skid slipper socks- Performed by helper: Don/doff right sock, Don/doff left sock Shoes - Performed by helper: Don/doff right shoe, Don/doff left shoe Assist for footwear: Supervision/touching assist, Setup Assist for lower body dressing: Assistive device, Touching or steadying assistance (Pt > 75%) Assistive Device Comment: sock aid  Function - Toileting Toileting activity did not occur: N/A Toileting steps completed by patient: Performs perineal hygiene Toileting steps completed by helper: Adjust clothing after toileting, Performs perineal hygiene Toileting Assistive Devices: Grab bar or rail Assist level: Touching or steadying assistance (Pt.75%)  Function - Air cabin crew transfer assistive device: Grab bar Assist level to toilet: Touching or steadying assistance (Pt > 75%) Assist level from toilet: Touching or steadying assistance (Pt > 75%)  Function - Chair/bed transfer Chair/bed transfer method: Squat pivot Chair/bed transfer assist level: Touching or steadying assistance (Pt > 75%) Chair/bed transfer assistive device: Armrests, Bedrails Chair/bed transfer details: Verbal cues for precautions/safety, Verbal cues for technique, Tactile cues for weight shifting, Tactile cues for sequencing  Function - Locomotion: Wheelchair Will patient use wheelchair at discharge?: Yes Type: Manual Max wheelchair distance: 100 Assist Level: Supervision or verbal cues Assist Level: Supervision or verbal cues Wheel 150 feet activity did not occur: Safety/medical concerns Turns around,maneuvers to table,bed, and toilet,negotiates 3% grade,maneuvers on rugs and over doorsills: No Function - Locomotion: Ambulation Assistive device: Walker-rolling Max distance: 40 Assist level: Moderate assist (Pt 50 - 74%) Walk 10 feet activity did not occur: Safety/medical concerns Assist level: Moderate assist (Pt 50 - 74%) Walk 50  feet with 2 turns activity did not occur: Safety/medical concerns Assist level: 2 helpers Walk 150 feet activity did not occur: Safety/medical concerns Walk 10 feet on uneven surfaces activity did not occur: Safety/medical concerns  Function - Comprehension Comprehension: Auditory Comprehension assist level: Follows basic conversation/direction with no assist  Function - Expression Expression: Verbal Expression assist level: Expresses basic 75 - 89% of the time/requires cueing 10 - 24% of the time. Needs helper to occlude trach/needs to repeat words.  Function - Social Interaction Social Interaction assist level: Interacts appropriately 90% of the time - Needs monitoring or encouragement for participation or interaction.  Function - Problem Solving Problem solving assist level: Solves basic 90% of the time/requires cueing <  10% of the time  Function - Memory Memory assist level: More than reasonable amount of time Patient normally able to recall (first 3 days only): That he or she is in a hospital  Medical Problem List and Plan: 1. Right hemiparesis as well as left side weakness/dysarthriasecondary to acute lacunar infarction involving the right medulla oblongata and medial aspect. Subacute large vessel left MCA infarct due to intracranial stenosis-  Pt progressing slowly but has the potential to progress beyond wheelchair level. If we extend him an extra week. I think this would be beneficial for the patient's recovery. CIR PT OT SLP        2. DVT Prophylaxis/Anticoagulation: Subcutaneous Lovenox. Monitor platelet counts of any signs of bleeding 3. Pain Management/chronic back pain: Oxycodone as needed 4. Mood: Xanax 0.5 mg 3 times a day as needed 5. Neuropsych: This patient iscapable of making decisions on hisown behalf. 6. Skin/Wound Care: Routine skin checks 7. Fluids/Electrolytes/Nutrition: Routine I&O's with follow-up chemistries 8.Hyperlipidemia. Lipitor 9.Tobacco abuse  counseling 10.Recent left ureteral stone. Has hematuria potentiated by anticoagulants, last Hgb is 14.8 on 9/79/14 13.7  Continue Flomax. Follow-up urology services, has stent placement, scheduled for removal which will be delayed by CVA, uro f/u as OP 11.Hypertension. Norvasc 5 mg daily. good control 9/20 Tachycardia has increased,  EKG is sinus, will start beta blocker,but will need to reduce norvasc Vitals:   04/27/17 2012 04/28/17 0439  BP: 118/65 129/78  Pulse: 98 (!) 117  Resp:  16  Temp:  97.8 F (36.6 C)  SpO2:  97%  12.  Mild leukocytosis, afebrile 13.  Tachycardia- no anemia, volume status looks normal, EKG show sinus rhythm- will monitor  14.  Insomnia - remeron, His complaints are inconsistent. May need some relaxation strategies 15.  Pt reports no BM x 2 d, discussed constipation related to immobility 16.  Bladder incont with freq at noc no dysuria, likely spastic bladder  17.  Emotional lability vs adjustment d/o f/u neuropsych, pt does not want med management of this, no further labile episodes noted 18.  UTI with bacteremia,afebrile on IV rocephin, ecoli in urine and blood, enterobacter in blood   LOS (Days) 15 A FACE TO FACE EVALUATION WAS PERFORMED  KIRSTEINS,ANDREW E 04/28/2017, 7:32 AM

## 2017-04-28 NOTE — Progress Notes (Signed)
SLP Cancellation Note  Patient Details Name: Brendan Charles MRN: [id number] DOB: 1956/07/09   Cancelled treatment:       Patient missed 60 minutes of skilled SLP intervention due to reports of feeling ill. Patient was asleep with minimal voicing and reported he did not feel well. RN aware. Continue with current plan of care.                                                                                                 Shawnita Krizek 04/28/2017, 12:27 PM

## 2017-04-28 NOTE — Progress Notes (Signed)
Occupational Therapy Note  Patient Details  Name: Brendan Charles MRN: [id number] Date of Birth: February 25, 1956  Today's Date: 04/28/2017 OT Missed Time: 45 Minutes (30 mins make up) Missed Time Reason: Patient ill (comment)  Pt missed 45 mins scheduled OT treatment session (30 mins for make up from previous missed session) due to pt reports of feeling bad and worn out due to UTI.  Pt reports working with OT this AM but feeling too worn out this afternoon.  RN aware.  Continue with current POC  Geralyn Figiel, [facility] 04/28/2017, 2:01 PM

## 2017-04-28 NOTE — Progress Notes (Signed)
Physical Therapy Note  Patient Details  Name: Brendan Charles MRN: [id number] Date of Birth: 1955/12/16 Today's Date: 04/28/2017    Pt in bed, asleep.  Warm to touch.  Declines therapy stating he feels sick.  Vitals assessed (see flowsheet).  RN aware.  Missed 60 minutes skilled PT.   Stephania Fragmin 04/28/2017, 4:24 PM

## 2017-04-29 ENCOUNTER — Inpatient Hospital Stay (HOSPITAL_COMMUNITY): Payer: Medicare Other | Admitting: Speech Pathology

## 2017-04-29 DIAGNOSIS — A419 Sepsis, unspecified organism: Secondary | ICD-10-CM

## 2017-04-29 DIAGNOSIS — A499 Bacterial infection, unspecified: Secondary | ICD-10-CM

## 2017-04-29 DIAGNOSIS — N390 Urinary tract infection, site not specified: Secondary | ICD-10-CM

## 2017-04-29 MED ORDER — METOPROLOL TARTRATE 25 MG PO TABS
25.0000 mg | ORAL_TABLET | Freq: Two times a day (BID) | ORAL | Status: DC
  Administered 2017-04-29 – 2017-05-12 (×26): 25 mg via ORAL
  Filled 2017-04-29 (×26): qty 1

## 2017-04-29 NOTE — Progress Notes (Signed)
RN discussed bladder management with pt. Pt refuses to attempt use of urinal or toilet. Pt states he does not know when he needs to void but will call when he is wet.  RN educated on importance of prevention of incontinent episodes and encouraged pt to attempt use of urinal. Pt continues to refuse to attempt to void.

## 2017-04-29 NOTE — Progress Notes (Signed)
Subjective/Complaints: "i haven't had therapy for two days". States he slept well  ROS: Limited due to cognitive/behavioral   Objective: Vital Signs: Blood pressure 128/88, pulse (!) 110, temperature 98.7 F (37.1 C), temperature source Oral, resp. rate 16, height _0  (1.803 m), weight 71.2 kg (157 lb), SpO2 98 %. Dg Chest Port 1 View  Result Date: 04/27/2017 CLINICAL DATA:  Fever, chills EXAM: PORTABLE CHEST 1 VIEW COMPARISON:  10/22/2014 FINDINGS: Heart and mediastinal contours are within normal limits. No focal opacities or effusions. No acute bony abnormality. IMPRESSION: No active disease. Electronically Signed   By: Rolm Baptise M.D.   On: 04/27/2017 09:54   Results for orders placed or performed during the hospital encounter of 04/13/17 (from the past 72 hour(s))  Urinalysis, Routine w reflex microscopic     Status: Abnormal   Collection Time: 04/26/17 12:59 PM  Result Value Ref Range   Color, Urine RED (A) YELLOW    Comment: BIOCHEMICALS MAY BE AFFECTED BY COLOR   APPearance CLOUDY (A) CLEAR   Specific Gravity, Urine 1.020 1.005 - 1.030   pH 6.5 5.0 - 8.0   Glucose, UA 100 (A) NEGATIVE mg/dL   Hgb urine dipstick LARGE (A) NEGATIVE   Bilirubin Urine MODERATE (A) NEGATIVE   Ketones, ur 15 (A) NEGATIVE mg/dL   Protein, ur >300 (A) NEGATIVE mg/dL   Nitrite POSITIVE (A) NEGATIVE   Leukocytes, UA LARGE (A) NEGATIVE  Urinalysis, Microscopic (reflex)     Status: Abnormal   Collection Time: 04/26/17 12:59 PM  Result Value Ref Range   RBC / HPF TOO NUMEROUS TO COUNT 0 - 5 RBC/hpf   WBC, UA TOO NUMEROUS TO COUNT 0 - 5 WBC/hpf   Bacteria, UA MANY (A) NONE SEEN   Squamous Epithelial / LPF NONE SEEN NONE SEEN  Urine Culture     Status: Abnormal   Collection Time: 04/26/17  2:51 PM  Result Value Ref Range   Specimen Description URINE, RANDOM    Special Requests NONE    Culture >=100,000 COLONIES/mL ESCHERICHIA COLI (A)    Report Status 04/28/2017 FINAL    Organism ID,  Bacteria ESCHERICHIA COLI (A)       Susceptibility   Escherichia coli - MIC*    AMPICILLIN >=32 RESISTANT Resistant     CEFAZOLIN <=4 SENSITIVE Sensitive     CEFTRIAXONE <=1 SENSITIVE Sensitive     CIPROFLOXACIN <=0.25 SENSITIVE Sensitive     GENTAMICIN <=1 SENSITIVE Sensitive     IMIPENEM <=0.25 SENSITIVE Sensitive     NITROFURANTOIN <=16 SENSITIVE Sensitive     TRIMETH/SULFA <=20 SENSITIVE Sensitive     AMPICILLIN/SULBACTAM 16 INTERMEDIATE Intermediate     PIP/TAZO <=4 SENSITIVE Sensitive     Extended ESBL NEGATIVE Sensitive     * >=100,000 COLONIES/mL ESCHERICHIA COLI  Creatinine, serum     Status: None   Collection Time: 04/27/17  5:30 AM  Result Value Ref Range   Creatinine, Ser 1.03 0.61 - 1.24 mg/dL   GFR calc non Af Amer >60 >60 mL/min   GFR calc Af Amer >60 >60 mL/min    Comment: (NOTE) The eGFR has been calculated using the CKD EPI equation. This calculation has not been validated in all clinical situations. eGFR's persistently <60 mL/min signify possible Chronic Kidney Disease.   Basic metabolic panel     Status: Abnormal   Collection Time: 04/27/17 10:47 AM  Result Value Ref Range   Sodium 134 (L) 135 - 145 mmol/L   Potassium 3.5 3.5 -  5.1 mmol/L   Chloride 102 101 - 111 mmol/L   CO2 19 (L) 22 - 32 mmol/L   Glucose, Bld 140 (H) 65 - 99 mg/dL   BUN 23 (H) 6 - 20 mg/dL   Creatinine, Ser 1.24 0.61 - 1.24 mg/dL   Calcium 9.0 8.9 - 10.3 mg/dL   GFR calc non Af Amer >60 >60 mL/min   GFR calc Af Amer >60 >60 mL/min    Comment: (NOTE) The eGFR has been calculated using the CKD EPI equation. This calculation has not been validated in all clinical situations. eGFR's persistently <60 mL/min signify possible Chronic Kidney Disease.    Anion gap 13 5 - 15  CBC with Differential/Platelet     Status: Abnormal   Collection Time: 04/27/17 10:47 AM  Result Value Ref Range   WBC 19.3 (H) 4.0 - 10.5 K/uL   RBC 4.36 4.22 - 5.81 MIL/uL   Hemoglobin 13.1 13.0 - 17.0 g/dL    HCT 39.7 39.0 - 52.0 %   MCV 91.1 78.0 - 100.0 fL   MCH 30.0 26.0 - 34.0 pg   MCHC 33.0 30.0 - 36.0 g/dL   RDW 14.1 11.5 - 15.5 %   Platelets 246 150 - 400 K/uL   Neutrophils Relative % 93 %   Neutro Abs 18.0 (H) 1.7 - 7.7 K/uL   Lymphocytes Relative 3 %   Lymphs Abs 0.6 (L) 0.7 - 4.0 K/uL   Monocytes Relative 4 %   Monocytes Absolute 0.7 0.1 - 1.0 K/uL   Eosinophils Relative 0 %   Eosinophils Absolute 0.0 0.0 - 0.7 K/uL   Basophils Relative 0 %   Basophils Absolute 0.0 0.0 - 0.1 K/uL  Culture, blood (Routine X 2) w Reflex to ID Panel     Status: None (Preliminary result)   Collection Time: 04/27/17 11:02 AM  Result Value Ref Range   Specimen Description BLOOD LEFT HAND    Special Requests IN PEDIATRIC BOTTLE Blood Culture adequate volume    Culture  Setup Time      GRAM NEGATIVE RODS IN PEDIATRIC BOTTLE CRITICAL VALUE NOTED.  VALUE IS CONSISTENT WITH PREVIOUSLY REPORTED AND CALLED VALUE.    Culture GRAM NEGATIVE RODS    Report Status PENDING   Culture, blood (Routine X 2) w Reflex to ID Panel     Status: None (Preliminary result)   Collection Time: 04/27/17 11:05 AM  Result Value Ref Range   Specimen Description BLOOD RIGHT HAND    Special Requests IN PEDIATRIC BOTTLE Blood Culture adequate volume    Culture  Setup Time      GRAM NEGATIVE RODS IN PEDIATRIC BOTTLE CRITICAL RESULT CALLED TO, READ BACK BY AND VERIFIED WITH: V. BRYK AT 5102 04/28/17 BY D. VANHOOK    Culture GRAM NEGATIVE RODS    Report Status PENDING   Blood Culture ID Panel (Reflexed)     Status: Abnormal   Collection Time: 04/27/17 11:05 AM  Result Value Ref Range   Enterococcus species NOT DETECTED NOT DETECTED   Listeria monocytogenes NOT DETECTED NOT DETECTED   Staphylococcus species NOT DETECTED NOT DETECTED   Staphylococcus aureus NOT DETECTED NOT DETECTED   Streptococcus species NOT DETECTED NOT DETECTED   Streptococcus agalactiae NOT DETECTED NOT DETECTED   Streptococcus pneumoniae NOT DETECTED  NOT DETECTED   Streptococcus pyogenes NOT DETECTED NOT DETECTED   Acinetobacter baumannii NOT DETECTED NOT DETECTED   Enterobacteriaceae species DETECTED (A) NOT DETECTED    Comment: Enterobacteriaceae represent a large family  of gram-negative bacteria, not a single organism. CRITICAL RESULT CALLED TO, READ BACK BY AND VERIFIED WITH: V. BRYK AT 0347 04/28/17 BY D. VANHOOK    Enterobacter cloacae complex NOT DETECTED NOT DETECTED   Escherichia coli DETECTED (A) NOT DETECTED    Comment: CRITICAL RESULT CALLED TO, READ BACK BY AND VERIFIED WITH: V. BRYK AT 4259 04/28/17 BY D. VANHOOK    Klebsiella oxytoca NOT DETECTED NOT DETECTED   Klebsiella pneumoniae NOT DETECTED NOT DETECTED   Proteus species NOT DETECTED NOT DETECTED   Serratia marcescens NOT DETECTED NOT DETECTED   Carbapenem resistance NOT DETECTED NOT DETECTED   Haemophilus influenzae NOT DETECTED NOT DETECTED   Neisseria meningitidis NOT DETECTED NOT DETECTED   Pseudomonas aeruginosa NOT DETECTED NOT DETECTED   Candida albicans NOT DETECTED NOT DETECTED   Candida glabrata NOT DETECTED NOT DETECTED   Candida krusei NOT DETECTED NOT DETECTED   Candida parapsilosis NOT DETECTED NOT DETECTED   Candida tropicalis NOT DETECTED NOT DETECTED  CBC     Status: Abnormal   Collection Time: 04/28/17  4:33 AM  Result Value Ref Range   WBC 16.6 (H) 4.0 - 10.5 K/uL   RBC 3.96 (L) 4.22 - 5.81 MIL/uL   Hemoglobin 12.1 (L) 13.0 - 17.0 g/dL   HCT 36.0 (L) 39.0 - 52.0 %   MCV 90.9 78.0 - 100.0 fL   MCH 30.6 26.0 - 34.0 pg   MCHC 33.6 30.0 - 36.0 g/dL   RDW 14.2 11.5 - 15.5 %   Platelets 205 150 - 400 K/uL     HEENT: normal and dysarthric Cardio: tachy,  Resp: CTA Bilaterally without wheezes or rales. Normal effort  GI: BS positive and NT, ND Extremity:  Pulses positive and No Edema Skin:   Intact Neuro: Alert/Oriented, Cranial Nerve Abnormalities Left central VII, Normal Sensory, Abnormal Motor 3- Left delt, Bi, tri, finger flexor/ext,  hip/knee extensor synergy, 2-/5 at ankle, 4/5 in RUE and RLE   and Dysarthric Musc/Skel:  Other no pain with UE or LE ROM Gen NAD   Assessment/Plan: 1. Functional deficits secondary to acute left hemiparesis, subacute right hemiparesis which require 3+ hours per day of interdisciplinary therapy in a comprehensive inpatient rehab setting. Physiatrist is providing close team supervision and 24 hour management of active medical problems listed below. Physiatrist and rehab team continue to assess barriers to discharge/monitor patient progress toward functional and medical goals. FIM: Function - Bathing Position: Shower Body parts bathed by patient: Right arm, Left arm, Chest, Abdomen, Front perineal area, Left upper leg, Right upper leg, Buttocks, Right lower leg, Left lower leg Body parts bathed by helper: Back Assist Level: Touching or steadying assistance(Pt > 75%), Assistive device Assistive Device Comment: long handled sponge  Function- Upper Body Dressing/Undressing What is the patient wearing?: Pull over shirt/dress Pull over shirt/dress - Perfomed by patient: Thread/unthread right sleeve, Thread/unthread left sleeve Pull over shirt/dress - Perfomed by helper: Put head through opening, Pull shirt over trunk Assist Level: Touching or steadying assistance(Pt > 75%) Function - Lower Body Dressing/Undressing What is the patient wearing?: Non-skid slipper socks, Pants Position: Wheelchair/chair at sink Pants- Performed by patient: Thread/unthread left pants leg Pants- Performed by helper: Thread/unthread right pants leg, Pull pants up/down Non-skid slipper socks- Performed by patient: Don/doff right sock Non-skid slipper socks- Performed by helper: Don/doff left sock Shoes - Performed by helper: Don/doff right shoe, Don/doff left shoe Assist for footwear: Supervision/touching assist, Setup Assist for lower body dressing: Touching or steadying assistance (Pt >  75%) Assistive Device  Comment: sock aid  Function - Toileting Toileting activity did not occur: N/A Toileting steps completed by patient: Performs perineal hygiene Toileting steps completed by helper: Adjust clothing after toileting, Performs perineal hygiene Toileting Assistive Devices: Grab bar or rail Assist level: Touching or steadying assistance (Pt.75%)  Function - Air cabin crew transfer assistive device: Grab bar Assist level to toilet: Touching or steadying assistance (Pt > 75%) Assist level from toilet: Touching or steadying assistance (Pt > 75%)  Function - Chair/bed transfer Chair/bed transfer method: Squat pivot Chair/bed transfer assist level: Touching or steadying assistance (Pt > 75%) Chair/bed transfer assistive device: Armrests, Bedrails Chair/bed transfer details: Verbal cues for precautions/safety, Verbal cues for technique, Tactile cues for weight shifting, Tactile cues for sequencing  Function - Locomotion: Wheelchair Will patient use wheelchair at discharge?: Yes Type: Manual Max wheelchair distance: 100 Assist Level: Supervision or verbal cues Assist Level: Supervision or verbal cues Wheel 150 feet activity did not occur: Safety/medical concerns Turns around,maneuvers to table,bed, and toilet,negotiates 3% grade,maneuvers on rugs and over doorsills: No Function - Locomotion: Ambulation Assistive device: Walker-rolling Max distance: 40 Assist level: Moderate assist (Pt 50 - 74%) Walk 10 feet activity did not occur: Safety/medical concerns Assist level: Moderate assist (Pt 50 - 74%) Walk 50 feet with 2 turns activity did not occur: Safety/medical concerns Assist level: 2 helpers Walk 150 feet activity did not occur: Safety/medical concerns Walk 10 feet on uneven surfaces activity did not occur: Safety/medical concerns  Function - Comprehension Comprehension: Auditory Comprehension assist level: Follows basic conversation/direction with no assist  Function -  Expression Expression: Verbal Expression assist level: Expresses basic 75 - 89% of the time/requires cueing 10 - 24% of the time. Needs helper to occlude trach/needs to repeat words.  Function - Social Interaction Social Interaction assist level: Interacts appropriately 90% of the time - Needs monitoring or encouragement for participation or interaction.  Function - Problem Solving Problem solving assist level: Solves basic 90% of the time/requires cueing < 10% of the time  Function - Memory Memory assist level: More than reasonable amount of time Patient normally able to recall (first 3 days only): That he or she is in a hospital  Medical Problem List and Plan: 1. Right hemiparesis as well as left side weakness/dysarthriasecondary to acute lacunar infarction involving the right medulla oblongata and medial aspect. Subacute large vessel left MCA infarct due to intracranial stenosis-    - CIR PT OT SLP         2. DVT Prophylaxis/Anticoagulation: Subcutaneous Lovenox. Monitor platelet counts of any signs of bleeding 3. Pain Management/chronic back pain: Oxycodone as needed 4. Mood: Xanax 0.5 mg 3 times a day as needed 5. Neuropsych: This patient iscapable of making decisions on hisown behalf. 6. Skin/Wound Care: Routine skin checks 7. Fluids/Electrolytes/Nutrition: Routine I&O's with follow-up chemistries 8.Hyperlipidemia. Lipitor 9.Tobacco abuse counseling 10.Recent left ureteral stone. Has hematuria potentiated by anticoagulants, last Hgb is 14.8 on 9/79/14 13.7  Continue Flomax. Follow-up urology services, has stent placement, scheduled for removal which will be delayed by CVA, uro f/u as OP 11.Hypertension. Norvasc 5 mg daily. good control 9/20   Vitals:   04/28/17 2134 04/29/17 0500  BP: 117/61 128/88  Pulse: (!) 114 (!) 110  Resp:  16  Temp: 99.3 F (37.4 C) 98.7 F (37.1 C)  SpO2:  98%  12.  Mild leukocytosis, afebrile 13.  Tachycardia- no anemia, volume status  looks normal, EKG show sinus rhythm- will monitor   -  increase metoprolol to 57m bid 14.  Insomnia - remeron, His complaints are inconsistent. May need some relaxation strategies 15.  Pt reports no BM x 2 d, discussed constipation related to immobility 16.  Bladder incont with freq at noc no dysuria, likely spastic bladder  17.  Emotional lability vs adjustment d/o f/u neuropsych, pt does not want med management of this, no further labile episodes noted 18.  UTI with bacteremia: ecoli in urine and blood, enterobacter in blood   -afebrile and on CTX. Consider changing to oral agent Monday     LOS (Days) 16 A FACE TO FACE EVALUATION WAS PERFORMED  Torryn Hudspeth T 04/29/2017, 8:34 AM

## 2017-04-29 NOTE — Progress Notes (Signed)
Speech Language Pathology Daily Session Note  Patient Details  Name: Brendan Charles MRN: [id number] Date of Birth: [birth year]  Today's Date: 04/29/2017 SLP Individual Time: 1455-1517 SLP Individual Time Calculation (min): 22 min  Short Term Goals: Week 3: SLP Short Term Goal 1 (Week 3): Patient will perform pharyngeal strengthening exercises with supervision verbal cues for accuracy.  SLP Short Term Goal 2 (Week 3): Patient will perform 25 repetitions of EMST/IMST excerises with Min A verbal cues for accuracy and a self-percieved effort level of 8/10.  SLP Short Term Goal 3 (Week 3): Patient will utilize speech intelligibility strategies to maximize speech intelligibility at the phrase level to 75% with supervision verbal cues. SLP Short Term Goal 4 (Week 3): Patient will consume trials of thin liquids via cup without overt s/s of aspiration with supervision verbal cues for use of swallowing strategies to assess readiness for repeat MBS.  SLP Short Term Goal 5 (Week 3): Patient will demonstrate efficent mastication of Dys. 2 textures without overt s/s of aspiration with Min A verbal cues over 2 sessions prior to upgrade.   Skilled Therapeutic Interventions:  Pt was seen for skilled ST targeting dysphagia and communication goals.  Pt needed mod assist verbal cues to increase vocal intensity to achieve intelligibility at the phrase level.  Pt reports feeling that he has had increased work of breathing following UTI due to fatigue and malaise which has lead to decreased intelligibility.  Therapist facilitated the session with trials of water following oral care per the water protocol to continue working towards diet progression.  Pt demonstrated intermittent weak coughing with trials of thin liquids which appeared to be related to increased work of breathing.  Pt was left in bed with bed alarm set and call bell within reach.  Continue per current plan of care.    Function:  Eating Eating    Modified Consistency Diet: Yes Eating Assist Level: More than reasonable amount of time           Cognition Comprehension Comprehension assist level: Follows basic conversation/direction with no assist  Expression   Expression assist level: Expresses basic 50 - 74% of the time/requires cueing 25 - 49% of the time. Needs to repeat parts of sentences.  Social Interaction Social Interaction assist level: Interacts appropriately 90% of the time - Needs monitoring or encouragement for participation or interaction.  Problem Solving Problem solving assist level: Solves basic 90% of the time/requires cueing < 10% of the time  Memory Memory assist level: More than reasonable amount of time    Pain Pain Assessment Pain Assessment: No/denies pain  Therapy/Group: Individual Therapy  Sharad Vaneaton, Melanee Spry 04/29/2017, 3:25 PM

## 2017-04-29 NOTE — Plan of Care (Signed)
Problem: RH BLADDER ELIMINATION Goal: RH STG MANAGE BLADDER WITH ASSISTANCE STG Manage Bladder With Min Assistance   Outcome: Not Progressing Incontinent-max assist   

## 2017-04-29 NOTE — Progress Notes (Signed)
Incontinent of urine, patient reports unaware when needing to void. Low grade temp, sweating- Offered PRN tylenol, refused. 0.45% NS infusing at HS.Brendan Charles A

## 2017-04-30 ENCOUNTER — Inpatient Hospital Stay (HOSPITAL_COMMUNITY): Payer: Medicare Other

## 2017-04-30 DIAGNOSIS — B9620 Unspecified Escherichia coli [E. coli] as the cause of diseases classified elsewhere: Secondary | ICD-10-CM

## 2017-04-30 LAB — CBC
HEMATOCRIT: 35.1 % — AB (ref 39.0–52.0)
Hemoglobin: 11.6 g/dL — ABNORMAL LOW (ref 13.0–17.0)
MCH: 30.1 pg (ref 26.0–34.0)
MCHC: 33 g/dL (ref 30.0–36.0)
MCV: 91.2 fL (ref 78.0–100.0)
PLATELETS: 214 10*3/uL (ref 150–400)
RBC: 3.85 MIL/uL — ABNORMAL LOW (ref 4.22–5.81)
RDW: 14.6 % (ref 11.5–15.5)
WBC: 7 10*3/uL (ref 4.0–10.5)

## 2017-04-30 LAB — BASIC METABOLIC PANEL
Anion gap: 7 (ref 5–15)
BUN: 16 mg/dL (ref 6–20)
CHLORIDE: 107 mmol/L (ref 101–111)
CO2: 24 mmol/L (ref 22–32)
CREATININE: 0.66 mg/dL (ref 0.61–1.24)
Calcium: 8.5 mg/dL — ABNORMAL LOW (ref 8.9–10.3)
GFR calc Af Amer: >60 mL/min (ref >60)
GFR calc non Af Amer: >60 mL/min (ref >60)
GLUCOSE: 94 mg/dL (ref 65–99)
POTASSIUM: 3.8 mmol/L (ref 3.5–5.1)
SODIUM: 138 mmol/L (ref 135–145)

## 2017-04-30 LAB — CULTURE, BLOOD (ROUTINE X 2)
Special Requests: ADEQUATE
Special Requests: ADEQUATE

## 2017-04-30 NOTE — Progress Notes (Signed)
Occupational Therapy Session Note  Patient Details  Name: Brendan Charles MRN: [id number] Date of Birth: 20-Feb-1956  Today's Date: 04/30/2017 OT Individual Time: 1600-1700 OT Individual Time Calculation (min): 60 min (15 min make up)   Short Term Goals: Week 2:  OT Short Term Goal 1 (Week 2): Pt will complete toilet transfers with S. OT Short Term Goal 2 (Week 2): Pt will complete 3/3 toileting tasks with S. OT Short Term Goal 3 (Week 2): Pt will be able to stand at sink with S to perform grooming tasks. OT Short Term Goal 4 (Week 2): Pt will use AE for bathing LB to increase safety with balance. OT Short Term Goal 5 (Week 2): Pt will use AE for donning socks and pants using his LUE as an active assist.  Skilled Therapeutic Interventions/Progress Updates:    1;1. Focus of session on bathing and dressing, sit to stand, and AE use. Pt reporting no pain, however extremely fatigued. Pt requesting to shower. Pt supine>sitting with VC for sequencing transition and MIN A for trunk elevation. Pt reporting dizziness after transition, however subsides with time. Pt stand pivot transfer EOB<>w/c<>TTB using hand rails and gab bars with touching A and VC for anterior weight shift as pt sits in posterior pelvic tilt. Pt bathes seated leaning laterally to wash buttocks and using LHSS to wash BLE. Pt requiring increased time to complete all movements and self initiates rest breaks as needed during shower 2/2 fatigue. Pt dons hospital gown with A to fasten in back and socks with supervision using sock aide with VC for technique. Exited session with pt semi reclined in bed set up with dinner tray and call light in reach.  Therapy Documentation Precautions:  Precautions Precautions: Fall Precaution Comments: new L hemiparesis; recent CVA with R sided weakness Restrictions Weight Bearing Restrictions: No  See Function Navigator for Current Functional Status.   Therapy/Group: Individual  Therapy  [name] 04/30/2017, 5:31 PM

## 2017-04-30 NOTE — Progress Notes (Signed)
Incontinent B & B during night. Brendan Charles A

## 2017-04-30 NOTE — Progress Notes (Signed)
Subjective/Complaints: Feeling better today. Recalls having therapy yesterday. Hopeful that "antibiotics are working"  ROS: pt denies nausea, vomiting, diarrhea, cough, shortness of breath or chest pain    Objective: Vital Signs: Blood pressure 121/73, pulse 95, temperature 98.4 F (36.9 C), temperature source Oral, resp. rate 18, height '5\' 11"'$  (1.803 m), weight 71.2 kg (157 lb), SpO2 96 %. No results found. Results for orders placed or performed during the hospital encounter of 04/13/17 (from the past 72 hour(s))  Basic metabolic panel     Status: Abnormal   Collection Time: 04/27/17 10:47 AM  Result Value Ref Range   Sodium 134 (L) 135 - 145 mmol/L   Potassium 3.5 3.5 - 5.1 mmol/L   Chloride 102 101 - 111 mmol/L   CO2 19 (L) 22 - 32 mmol/L   Glucose, Bld 140 (H) 65 - 99 mg/dL   BUN 23 (H) 6 - 20 mg/dL   Creatinine, Ser 1.24 0.61 - 1.24 mg/dL   Calcium 9.0 8.9 - 10.3 mg/dL   GFR calc non Af Amer >60 >60 mL/min   GFR calc Af Amer >60 >60 mL/min    Comment: (NOTE) The eGFR has been calculated using the CKD EPI equation. This calculation has not been validated in all clinical situations. eGFR's persistently <60 mL/min signify possible Chronic Kidney Disease.    Anion gap 13 5 - 15  CBC with Differential/Platelet     Status: Abnormal   Collection Time: 04/27/17 10:47 AM  Result Value Ref Range   WBC 19.3 (H) 4.0 - 10.5 K/uL   RBC 4.36 4.22 - 5.81 MIL/uL   Hemoglobin 13.1 13.0 - 17.0 g/dL   HCT 39.7 39.0 - 52.0 %   MCV 91.1 78.0 - 100.0 fL   MCH 30.0 26.0 - 34.0 pg   MCHC 33.0 30.0 - 36.0 g/dL   RDW 14.1 11.5 - 15.5 %   Platelets 246 150 - 400 K/uL   Neutrophils Relative % 93 %   Neutro Abs 18.0 (H) 1.7 - 7.7 K/uL   Lymphocytes Relative 3 %   Lymphs Abs 0.6 (L) 0.7 - 4.0 K/uL   Monocytes Relative 4 %   Monocytes Absolute 0.7 0.1 - 1.0 K/uL   Eosinophils Relative 0 %   Eosinophils Absolute 0.0 0.0 - 0.7 K/uL   Basophils Relative 0 %   Basophils Absolute 0.0 0.0 - 0.1  K/uL  Culture, blood (Routine X 2) w Reflex to ID Panel     Status: Abnormal   Collection Time: 04/27/17 11:02 AM  Result Value Ref Range   Specimen Description BLOOD LEFT HAND    Special Requests IN PEDIATRIC BOTTLE Blood Culture adequate volume    Culture  Setup Time      GRAM NEGATIVE RODS IN PEDIATRIC BOTTLE CRITICAL VALUE NOTED.  VALUE IS CONSISTENT WITH PREVIOUSLY REPORTED AND CALLED VALUE.    Culture (A)     ESCHERICHIA COLI SUSCEPTIBILITIES PERFORMED ON PREVIOUS CULTURE WITHIN THE LAST 5 DAYS.    Report Status 04/30/2017 FINAL   Culture, blood (Routine X 2) w Reflex to ID Panel     Status: Abnormal   Collection Time: 04/27/17 11:05 AM  Result Value Ref Range   Specimen Description BLOOD RIGHT HAND    Special Requests IN PEDIATRIC BOTTLE Blood Culture adequate volume    Culture  Setup Time      GRAM NEGATIVE RODS IN PEDIATRIC BOTTLE CRITICAL RESULT CALLED TO, READ BACK BY AND VERIFIED WITH: V. BRYK AT 1937 04/28/17 BY  D. VANHOOK    Culture ESCHERICHIA COLI (A)    Report Status 04/30/2017 FINAL    Organism ID, Bacteria ESCHERICHIA COLI       Susceptibility   Escherichia coli - MIC*    AMPICILLIN >=32 RESISTANT Resistant     CEFAZOLIN <=4 SENSITIVE Sensitive     CEFEPIME <=1 SENSITIVE Sensitive     CEFTAZIDIME <=1 SENSITIVE Sensitive     CEFTRIAXONE <=1 SENSITIVE Sensitive     CIPROFLOXACIN <=0.25 SENSITIVE Sensitive     GENTAMICIN <=1 SENSITIVE Sensitive     IMIPENEM <=0.25 SENSITIVE Sensitive     TRIMETH/SULFA <=20 SENSITIVE Sensitive     AMPICILLIN/SULBACTAM 16 INTERMEDIATE Intermediate     PIP/TAZO <=4 SENSITIVE Sensitive     Extended ESBL NEGATIVE Sensitive     * ESCHERICHIA COLI  Blood Culture ID Panel (Reflexed)     Status: Abnormal   Collection Time: 04/27/17 11:05 AM  Result Value Ref Range   Enterococcus species NOT DETECTED NOT DETECTED   Listeria monocytogenes NOT DETECTED NOT DETECTED   Staphylococcus species NOT DETECTED NOT DETECTED    Staphylococcus aureus NOT DETECTED NOT DETECTED   Streptococcus species NOT DETECTED NOT DETECTED   Streptococcus agalactiae NOT DETECTED NOT DETECTED   Streptococcus pneumoniae NOT DETECTED NOT DETECTED   Streptococcus pyogenes NOT DETECTED NOT DETECTED   Acinetobacter baumannii NOT DETECTED NOT DETECTED   Enterobacteriaceae species DETECTED (A) NOT DETECTED    Comment: Enterobacteriaceae represent a large family of gram-negative bacteria, not a single organism. CRITICAL RESULT CALLED TO, READ BACK BY AND VERIFIED WITH: V. BRYK AT 2426 04/28/17 BY D. VANHOOK    Enterobacter cloacae complex NOT DETECTED NOT DETECTED   Escherichia coli DETECTED (A) NOT DETECTED    Comment: CRITICAL RESULT CALLED TO, READ BACK BY AND VERIFIED WITH: V. BRYK AT 8341 04/28/17 BY D. VANHOOK    Klebsiella oxytoca NOT DETECTED NOT DETECTED   Klebsiella pneumoniae NOT DETECTED NOT DETECTED   Proteus species NOT DETECTED NOT DETECTED   Serratia marcescens NOT DETECTED NOT DETECTED   Carbapenem resistance NOT DETECTED NOT DETECTED   Haemophilus influenzae NOT DETECTED NOT DETECTED   Neisseria meningitidis NOT DETECTED NOT DETECTED   Pseudomonas aeruginosa NOT DETECTED NOT DETECTED   Candida albicans NOT DETECTED NOT DETECTED   Candida glabrata NOT DETECTED NOT DETECTED   Candida krusei NOT DETECTED NOT DETECTED   Candida parapsilosis NOT DETECTED NOT DETECTED   Candida tropicalis NOT DETECTED NOT DETECTED  CBC     Status: Abnormal   Collection Time: 04/28/17  4:33 AM  Result Value Ref Range   WBC 16.6 (H) 4.0 - 10.5 K/uL   RBC 3.96 (L) 4.22 - 5.81 MIL/uL   Hemoglobin 12.1 (L) 13.0 - 17.0 g/dL   HCT 36.0 (L) 39.0 - 52.0 %   MCV 90.9 78.0 - 100.0 fL   MCH 30.6 26.0 - 34.0 pg   MCHC 33.6 30.0 - 36.0 g/dL   RDW 14.2 11.5 - 15.5 %   Platelets 205 150 - 400 K/uL  Basic metabolic panel     Status: Abnormal   Collection Time: 04/30/17  5:49 AM  Result Value Ref Range   Sodium 138 135 - 145 mmol/L   Potassium  3.8 3.5 - 5.1 mmol/L   Chloride 107 101 - 111 mmol/L   CO2 24 22 - 32 mmol/L   Glucose, Bld 94 65 - 99 mg/dL   BUN 16 6 - 20 mg/dL   Creatinine, Ser 0.66 0.61 -  1.24 mg/dL   Calcium 8.5 (L) 8.9 - 10.3 mg/dL   GFR calc non Af Amer >60 >60 mL/min   GFR calc Af Amer >60 >60 mL/min    Comment: (NOTE) The eGFR has been calculated using the CKD EPI equation. This calculation has not been validated in all clinical situations. eGFR's persistently <60 mL/min signify possible Chronic Kidney Disease.    Anion gap 7 5 - 15  CBC     Status: Abnormal   Collection Time: 04/30/17  5:49 AM  Result Value Ref Range   WBC 7.0 4.0 - 10.5 K/uL   RBC 3.85 (L) 4.22 - 5.81 MIL/uL   Hemoglobin 11.6 (L) 13.0 - 17.0 g/dL   HCT 73.7 (L) 49.6 - 64.6 %   MCV 91.2 78.0 - 100.0 fL   MCH 30.1 26.0 - 34.0 pg   MCHC 33.0 30.0 - 36.0 g/dL   RDW 60.5 63.7 - 29.4 %   Platelets 214 150 - 400 K/uL     HEENT: normal and dysarthric Cardio: RRR without murmur. No JVD   Resp: CTA Bilaterally without wheezes or rales. Normal effort   GI: BS positive and NT, ND Extremity:  Pulses positive and No Edema Skin:   Intact Neuro: Alert/Oriented, Cranial Nerve Abnormalities Left central VII, Normal Sensory, Abnormal Motor 3- Left delt, Bi, tri, finger flexor/ext, hip/knee extensor synergy, 2-/5 at ankle, 4/5 in RUE and RLE   and remains dysarthric. Less distracted today Musc/Skel:  Other no pain with UE or LE ROM Gen NAD   Assessment/Plan: 1. Functional deficits secondary to acute left hemiparesis, subacute right hemiparesis which require 3+ hours per day of interdisciplinary therapy in a comprehensive inpatient rehab setting. Physiatrist is providing close team supervision and 24 hour management of active medical problems listed below. Physiatrist and rehab team continue to assess barriers to discharge/monitor patient progress toward functional and medical goals. FIM: Function - Bathing Position: Shower Body parts bathed  by patient: Right arm, Left arm, Chest, Abdomen, Front perineal area, Left upper leg, Right upper leg, Buttocks, Right lower leg, Left lower leg Body parts bathed by helper: Back Assist Level: Touching or steadying assistance(Pt > 75%), Assistive device Assistive Device Comment: long handled sponge  Function- Upper Body Dressing/Undressing What is the patient wearing?: Pull over shirt/dress Pull over shirt/dress - Perfomed by patient: Thread/unthread right sleeve, Thread/unthread left sleeve Pull over shirt/dress - Perfomed by helper: Put head through opening, Pull shirt over trunk Assist Level: Touching or steadying assistance(Pt > 75%) Function - Lower Body Dressing/Undressing What is the patient wearing?: Non-skid slipper socks, Pants Position: Wheelchair/chair at sink Pants- Performed by patient: Thread/unthread left pants leg Pants- Performed by helper: Thread/unthread right pants leg, Pull pants up/down Non-skid slipper socks- Performed by patient: Don/doff right sock Non-skid slipper socks- Performed by helper: Don/doff left sock Shoes - Performed by helper: Don/doff right shoe, Don/doff left shoe Assist for footwear: Supervision/touching assist, Setup Assist for lower body dressing: Touching or steadying assistance (Pt > 75%) Assistive Device Comment: sock aid  Function - Toileting Toileting activity did not occur: N/A Toileting steps completed by patient: Performs perineal hygiene Toileting steps completed by helper: Adjust clothing after toileting, Performs perineal hygiene Toileting Assistive Devices: Grab bar or rail Assist level: Touching or steadying assistance (Pt.75%)  Function - Archivist transfer assistive device: Grab bar Assist level to toilet: Touching or steadying assistance (Pt > 75%) Assist level from toilet: Touching or steadying assistance (Pt > 75%)  Function - Chair/bed  transfer Chair/bed transfer method: Squat pivot Chair/bed transfer  assist level: Touching or steadying assistance (Pt > 75%) Chair/bed transfer assistive device: Armrests, Bedrails Chair/bed transfer details: Verbal cues for precautions/safety, Verbal cues for technique, Tactile cues for weight shifting, Tactile cues for sequencing  Function - Locomotion: Wheelchair Will patient use wheelchair at discharge?: Yes Type: Manual Max wheelchair distance: 100 Assist Level: Supervision or verbal cues Assist Level: Supervision or verbal cues Wheel 150 feet activity did not occur: Safety/medical concerns Turns around,maneuvers to table,bed, and toilet,negotiates 3% grade,maneuvers on rugs and over doorsills: No Function - Locomotion: Ambulation Assistive device: Walker-rolling Max distance: 40 Assist level: Moderate assist (Pt 50 - 74%) Walk 10 feet activity did not occur: Safety/medical concerns Assist level: Moderate assist (Pt 50 - 74%) Walk 50 feet with 2 turns activity did not occur: Safety/medical concerns Assist level: 2 helpers Walk 150 feet activity did not occur: Safety/medical concerns Walk 10 feet on uneven surfaces activity did not occur: Safety/medical concerns  Function - Comprehension Comprehension: Auditory Comprehension assist level: Follows basic conversation/direction with no assist  Function - Expression Expression: Verbal Expression assist level: Expresses basic 75 - 89% of the time/requires cueing 10 - 24% of the time. Needs helper to occlude trach/needs to repeat words.  Function - Social Interaction Social Interaction assist level: Interacts appropriately 90% of the time - Needs monitoring or encouragement for participation or interaction.  Function - Problem Solving Problem solving assist level: Solves basic 90% of the time/requires cueing < 10% of the time  Function - Memory Memory assist level: More than reasonable amount of time Patient normally able to recall (first 3 days only): That he or she is in a hospital  Medical  Problem List and Plan: 1. Right hemiparesis as well as left side weakness/dysarthriasecondary to acute lacunar infarction involving the right medulla oblongata and medial aspect. Subacute large vessel left MCA infarct due to intracranial stenosis-    - CIR PT OT SLP         2. DVT Prophylaxis/Anticoagulation: Subcutaneous Lovenox. Monitor platelet counts of any signs of bleeding 3. Pain Management/chronic back pain: Oxycodone as needed 4. Mood: Xanax 0.5 mg 3 times a day as needed 5. Neuropsych: This patient iscapable of making decisions on hisown behalf. 6. Skin/Wound Care: Routine skin checks 7. Fluids/Electrolytes/Nutrition: Routine I&O's with follow-up chemistries 8.Hyperlipidemia. Lipitor 9.Tobacco abuse counseling 10.Recent left ureteral stone. Has hematuria potentiated by anticoagulants, last Hgb is 14.8 on 9/79/14 13.7  Continue Flomax. Follow-up urology services, has stent placement, scheduled for removal which will be delayed by CVA, uro f/u as OP 11.Hypertension. Norvasc 5 mg daily. good control 9/23   Vitals:   04/29/17 2032 04/30/17 0500  BP: 127/83 121/73  Pulse: 96 95  Resp:  18  Temp:  98.4 F (36.9 C)  SpO2:  96%  12.  Mild leukocytosis, afebrile 13.  Tachycardia- no anemia, volume status looks normal, EKG show sinus rhythm- will monitor   -increased metoprolol to '25mg'$  bid with good results, bp holding 14.  Insomnia - remeron, His complaints are inconsistent. May need some relaxation strategies 15.  Pt reports no BM x 2 d, discussed constipation related to immobility 16.  Bladder incont with freq at noc no dysuria, likely spastic bladder  17.  Emotional lability vs adjustment d/o f/u neuropsych, pt does not want med management of this. 18.  UTI with bacteremia: ecoli in urine and blood, enterobacter in blood   -afebrile. Feels better  -continue ceftriaxone-- Consider changing to  oral agent Monday     LOS (Days) 17 A FACE TO FACE EVALUATION WAS  PERFORMED  SWARTZ,ZACHARY T 04/30/2017, 8:24 AM

## 2017-04-30 NOTE — Plan of Care (Signed)
Problem: RH BOWEL ELIMINATION Goal: RH STG MANAGE BOWEL WITH ASSISTANCE STG Manage Bowel with Mod I Assistance.   Outcome: Not Progressing Pt incont of bowel   Problem: RH BLADDER ELIMINATION Goal: RH STG MANAGE BLADDER WITH ASSISTANCE STG Manage Bladder With Min Assistance   Outcome: Not Progressing incont of bladder

## 2017-05-01 ENCOUNTER — Inpatient Hospital Stay (HOSPITAL_COMMUNITY): Payer: Medicare Other | Admitting: Physical Therapy

## 2017-05-01 ENCOUNTER — Inpatient Hospital Stay (HOSPITAL_COMMUNITY): Payer: Medicare Other | Admitting: Speech Pathology

## 2017-05-01 ENCOUNTER — Inpatient Hospital Stay (HOSPITAL_COMMUNITY): Payer: Medicare Other | Admitting: Occupational Therapy

## 2017-05-01 NOTE — Progress Notes (Signed)
Speech Language Pathology Daily Session Note  Patient Details  Name: Brendan Charles MRN: [id number] Date of Birth: [birth year]  Today's Date: 05/01/2017 SLP Individual Time: 1100-1155 SLP Individual Time Calculation (min): 55 min  Short Term Goals: Week 3: SLP Short Term Goal 1 (Week 3): Patient will perform pharyngeal strengthening exercises with supervision verbal cues for accuracy.  SLP Short Term Goal 2 (Week 3): Patient will perform 25 repetitions of EMST/IMST excerises with Min A verbal cues for accuracy and a self-percieved effort level of 8/10.  SLP Short Term Goal 3 (Week 3): Patient will utilize speech intelligibility strategies to maximize speech intelligibility at the phrase level to 75% with supervision verbal cues. SLP Short Term Goal 4 (Week 3): Patient will consume trials of thin liquids via cup without overt s/s of aspiration with supervision verbal cues for use of swallowing strategies to assess readiness for repeat MBS.  SLP Short Term Goal 5 (Week 3): Patient will demonstrate efficent mastication of Dys. 2 textures without overt s/s of aspiration with Min A verbal cues over 2 sessions prior to upgrade.   Skilled Therapeutic Interventions: Skilled treatment session focused on dysphagia and speech goals. SLP facilitated session by providing Min A verbal cues for accuracy while performing 15 repetitions of EMST/IMST at a self-perceived effort level of 10/10. Patient also consumed trials of ice chips and thin liquids via tsp without overt s/s of aspiration and intermittent use of multiple swallows. Patient also performed a verbal description task at the phrase and sentence level with 90% intelligibility and Min A verbal cues for use of an increased vocal intensity. Overall, patient reported he felt better today but appeared weaker in regards to vocal intensity and required assistance with self-feeding due to UE weakness. Patient left upright in wheelchair with all needs within reach.  Continue with current plan of care.      Function:  Cognition Comprehension Comprehension assist level: Follows basic conversation/direction with no assist  Expression   Expression assist level: Expresses basic 75 - 89% of the time/requires cueing 10 - 24% of the time. Needs helper to occlude trach/needs to repeat words.  Social Interaction Social Interaction assist level: Interacts appropriately 90% of the time - Needs monitoring or encouragement for participation or interaction.  Problem Solving Problem solving assist level: Solves basic 90% of the time/requires cueing < 10% of the time  Memory Memory assist level: More than reasonable amount of time    Pain Pain Assessment Pain Assessment: No/denies pain   Therapy/Group: Individual Therapy  Brendan Charles 05/01/2017, 12:29 PM

## 2017-05-01 NOTE — Progress Notes (Signed)
Subjective/Complaints:    ROS: pt denies nausea, vomiting, diarrhea, cough, shortness of breath or chest pain    Objective: Vital Signs: Blood pressure 122/77, pulse 89, temperature 97.8 F (36.6 C), temperature source Oral, resp. rate 18, height '5\' 11"'$  (1.803 m), weight 71.2 kg (157 lb), SpO2 98 %. No results found. Results for orders placed or performed during the hospital encounter of 04/13/17 (from the past 72 hour(s))  Basic metabolic panel     Status: Abnormal   Collection Time: 04/30/17  5:49 AM  Result Value Ref Range   Sodium 138 135 - 145 mmol/L   Potassium 3.8 3.5 - 5.1 mmol/L   Chloride 107 101 - 111 mmol/L   CO2 24 22 - 32 mmol/L   Glucose, Bld 94 65 - 99 mg/dL   BUN 16 6 - 20 mg/dL   Creatinine, Ser 0.66 0.61 - 1.24 mg/dL   Calcium 8.5 (L) 8.9 - 10.3 mg/dL   GFR calc non Af Amer >60 >60 mL/min   GFR calc Af Amer >60 >60 mL/min    Comment: (NOTE) The eGFR has been calculated using the CKD EPI equation. This calculation has not been validated in all clinical situations. eGFR's persistently <60 mL/min signify possible Chronic Kidney Disease.    Anion gap 7 5 - 15  CBC     Status: Abnormal   Collection Time: 04/30/17  5:49 AM  Result Value Ref Range   WBC 7.0 4.0 - 10.5 K/uL   RBC 3.85 (L) 4.22 - 5.81 MIL/uL   Hemoglobin 11.6 (L) 13.0 - 17.0 g/dL   HCT 35.1 (L) 39.0 - 52.0 %   MCV 91.2 78.0 - 100.0 fL   MCH 30.1 26.0 - 34.0 pg   MCHC 33.0 30.0 - 36.0 g/dL   RDW 14.6 11.5 - 15.5 %   Platelets 214 150 - 400 K/uL     HEENT: normal and dysarthric Cardio: RRR without murmur. No JVD   Resp: CTA Bilaterally without wheezes or rales. Normal effort   GI: BS positive and NT, ND Extremity:  Pulses positive and No Edema Skin:   Intact Neuro: Alert/Oriented, Cranial Nerve Abnormalities Left central VII, Normal Sensory, Abnormal Motor 3- Left delt, Bi, tri, finger flexor/ext, hip/knee extensor synergy, 2-/5 at ankle, 4/5 in RUE and RLE   and remains dysarthric. Less  distracted today Musc/Skel:  Other no pain with UE or LE ROM Gen NAD   Assessment/Plan: 1. Functional deficits secondary to acute left hemiparesis, subacute right hemiparesis which require 3+ hours per day of interdisciplinary therapy in a comprehensive inpatient rehab setting. Physiatrist is providing close team supervision and 24 hour management of active medical problems listed below. Physiatrist and rehab team continue to assess barriers to discharge/monitor patient progress toward functional and medical goals. FIM: Function - Bathing Position: Shower Body parts bathed by patient: Right arm, Left arm, Chest, Abdomen, Front perineal area, Left upper leg, Right upper leg, Buttocks, Right lower leg, Left lower leg Body parts bathed by helper: Back Assist Level: Touching or steadying assistance(Pt > 75%), Assistive device Assistive Device Comment: long handled sponge  Function- Upper Body Dressing/Undressing What is the patient wearing?: Pull over shirt/dress Pull over shirt/dress - Perfomed by patient: Thread/unthread right sleeve, Thread/unthread left sleeve Pull over shirt/dress - Perfomed by helper: Put head through opening, Pull shirt over trunk Assist Level: Touching or steadying assistance(Pt > 75%) Function - Lower Body Dressing/Undressing What is the patient wearing?: Non-skid slipper socks, Pants Position: Wheelchair/chair at sink Pants-  Performed by patient: Thread/unthread left pants leg Pants- Performed by helper: Thread/unthread right pants leg, Pull pants up/down Non-skid slipper socks- Performed by patient: Don/doff right sock Non-skid slipper socks- Performed by helper: Don/doff left sock Shoes - Performed by helper: Don/doff right shoe, Don/doff left shoe Assist for footwear: Supervision/touching assist, Setup Assist for lower body dressing: Touching or steadying assistance (Pt > 75%) Assistive Device Comment: sock aid  Function - Toileting Toileting activity did  not occur: N/A Toileting steps completed by patient: Performs perineal hygiene Toileting steps completed by helper: Adjust clothing after toileting, Performs perineal hygiene Toileting Assistive Devices: Grab bar or rail Assist level: Touching or steadying assistance (Pt.75%)  Function - Air cabin crew transfer assistive device: Grab bar Assist level to toilet: Touching or steadying assistance (Pt > 75%) Assist level from toilet: Touching or steadying assistance (Pt > 75%)  Function - Chair/bed transfer Chair/bed transfer method: Squat pivot Chair/bed transfer assist level: Touching or steadying assistance (Pt > 75%) Chair/bed transfer assistive device: Armrests, Bedrails Chair/bed transfer details: Verbal cues for precautions/safety, Verbal cues for technique, Tactile cues for weight shifting, Tactile cues for sequencing  Function - Locomotion: Wheelchair Will patient use wheelchair at discharge?: Yes Type: Manual Max wheelchair distance: 100 Assist Level: Supervision or verbal cues Assist Level: Supervision or verbal cues Wheel 150 feet activity did not occur: Safety/medical concerns Turns around,maneuvers to table,bed, and toilet,negotiates 3% grade,maneuvers on rugs and over doorsills: No Function - Locomotion: Ambulation Assistive device: Walker-rolling Max distance: 40 Assist level: Moderate assist (Pt 50 - 74%) Walk 10 feet activity did not occur: Safety/medical concerns Assist level: Moderate assist (Pt 50 - 74%) Walk 50 feet with 2 turns activity did not occur: Safety/medical concerns Assist level: 2 helpers Walk 150 feet activity did not occur: Safety/medical concerns Walk 10 feet on uneven surfaces activity did not occur: Safety/medical concerns  Function - Comprehension Comprehension: Auditory Comprehension assist level: Follows basic conversation/direction with no assist  Function - Expression Expression: Verbal Expression assist level: Expresses basic  75 - 89% of the time/requires cueing 10 - 24% of the time. Needs helper to occlude trach/needs to repeat words.  Function - Social Interaction Social Interaction assist level: Interacts appropriately 90% of the time - Needs monitoring or encouragement for participation or interaction.  Function - Problem Solving Problem solving assist level: Solves basic 90% of the time/requires cueing < 10% of the time  Function - Memory Memory assist level: More than reasonable amount of time Patient normally able to recall (first 3 days only): That he or she is in a hospital  Medical Problem List and Plan: 1. Right hemiparesis as well as left side weakness/dysarthriasecondary to acute lacunar infarction involving the right medulla oblongata and medial aspect. Subacute large vessel left MCA infarct due to intracranial stenosis-    - CIR PT OT SLP      Pt lost a couple days with his e coli bacteremia  , no change in D/C date 10/5 anticipated 2. DVT Prophylaxis/Anticoagulation: Subcutaneous Lovenox. Monitor platelet counts of any signs of bleeding 3. Pain Management/chronic back pain: Oxycodone as needed 4. Mood: Xanax 0.5 mg 3 times a day as needed 5. Neuropsych: This patient iscapable of making decisions on hisown behalf. 6. Skin/Wound Care: Routine skin checks 7. Fluids/Electrolytes/Nutrition: Routine I&O's with follow-up chemistries 8.Hyperlipidemia. Lipitor 9.Tobacco abuse counseling 10.Recent left ureteral stone. Has hematuria potentiated by anticoagulants, last Hgb is 14.8 on 9/79/14 13.7  Continue Flomax. Follow-up urology services, has stent placement, scheduled for  removal which will be delayed by CVA, uro f/u as OP 11.Hypertension. Norvasc 5 mg daily. good control 9/24   Vitals:   04/30/17 2010 05/01/17 0500  BP: 122/72 122/77  Pulse: 86 89  Resp:  18  Temp:  97.8 F (36.6 C)  SpO2:  98%  12.  Mild leukocytosis, afebrile 13.  Tachycardia- no anemia, volume status looks normal,  EKG show sinus rhythm- will monitor   -increased metoprolol to '25mg'$  bid with good results, bp holding 14.  Insomnia - remeron, His complaints are inconsistent. May need some relaxation strategies 15.  Pt reports no BM x 2 d, discussed constipation related to immobility 16.  Bladder incont with freq at noc no dysuria, likely spastic bladder  17.  Emotional lability vs adjustment d/o f/u neuropsych, pt does not want med management of this. 18.  UTI with bacteremia: ecoli in urine and blood, enterobacter in blood   -afebrile. Feels better  -continue ceftriaxone though 9/28     LOS (Days) 18 A FACE TO FACE EVALUATION WAS PERFORMED  KIRSTEINS,ANDREW E 05/01/2017, 7:45 AM

## 2017-05-01 NOTE — Progress Notes (Signed)
Physical Therapy Session Note  Patient Details  Name: Brendan Charles MRN: [id number] Date of Birth: Oct 08, 1955  Today's Date: 05/01/2017 PT Individual Time: 1300-1408 PT Individual Time Calculation (min): 68 min   Short Term Goals: Week 2:  PT Short Term Goal 1 (Week 2): Pt will transfer with consistent min assist PT Short Term Goal 2 (Week 2): Pt will propel w/c x150' with no rest breaks and supervision PT Short Term Goal 3 (Week 2): Pt will initiate stair training with PT PT Short Term Goal 4 (Week 2): Pt will ambulate 30' with +1 assist  Skilled Therapeutic Interventions/Progress Updates:    no c/o pain, session focus on functional mobility and activity tolerance.   Pt transferred throughout session using squat/pivot transfer and requiring supervision for set up and minimal guarding. Pt performed sit > stand x 2 with min A, ambulating 31' with min A with verbal and tactile cuing for weight shift to the L to allow longer steps on the R, tactile cuing for posture, and hand on RW to help control speed. Pt required rest break between ambulation, ambulating 35' with mod A due to fatigue for the second bout of ambulation. Pt required verbal and tactile cuing for weight shifting, upright trunk, and RW speed control. Pt required prolonged rest breaks 2/2 increased fatigue due to being sick and without physical therapy the last 4 days. Pt performed w/c mobility with self propulsion using B UE x 100' with supervision, requiring assistance after 100' due to fatigue. Pt performed Dyn[employer]ision seated in w/c on Mode A, using 4 rings in all quadrants, for 1 min x 3 with 17 hits, 14 hits, and 18 hits among the three trials; task focused on forward weight shift, trunk extension, anterior pelvic tilt, overhead reaching, and gross motor coordination. Pt returned to room and left supine in bed with call bell and all needs addressed.   Therapy Documentation Precautions:  Precautions Precautions:  Fall Precaution Comments: new L hemiparesis; recent CVA with R sided weakness Restrictions Weight Bearing Restrictions: No   See Function Navigator for Current Functional Status.   Therapy/Group: Individual Therapy  Dossie Arbour 05/01/2017, 2:17 PM

## 2017-05-01 NOTE — Plan of Care (Signed)
Problem: RH BOWEL ELIMINATION Goal: RH STG MANAGE BOWEL WITH ASSISTANCE STG Manage Bowel with Mod I Assistance.   Outcome: Not Progressing Requires total assist at this time due to incontinence from IV rocephin  Problem: RH BLADDER ELIMINATION Goal: RH STG MANAGE BLADDER WITH ASSISTANCE STG Manage Bladder With Min Assistance   Outcome: Not Progressing Requires max assist incontinent

## 2017-05-01 NOTE — Progress Notes (Signed)
Occupational Therapy Weekly Progress Note  Patient Details  Name: Brendan Charles MRN: [id number] Date of Birth: 1956-07-15  Beginning of progress report period: April 14, 2017 End of progress report period: May 01, 2017  Today's Date: 05/01/2017 OT Individual Time: 1000-1100 OT Individual Time Calculation (min): 60 min    Patient has met 2 of 5 short term goals. Pt had slight set-back last week with onset of UTI, fever, and fatigue. Today, pt feeling much better and demonstrated continued progress toward OT goals. Pt able to complete stand-pivot transfers with min A/supervision. Sit<>stands for BADL tasks with min guard A and is progressing with his overall activity tolerance and balance strategies. Continue current plan of care.   Patient continues to demonstrate the following deficits: muscle weakness, decreased cardiorespiratoy endurance, impaired timing and sequencing, abnormal tone, unbalanced muscle activation, motor apraxia, ataxia, decreased coordination and decreased motor planning, decreased midline orientation and decreased attention to left and decreased sitting balance, decreased standing balance, decreased postural control, hemiplegia and decreased balance strategies and therefore will continue to benefit from skilled OT intervention to enhance overall performance with BADL and Reduce care partner burden.  Patient progressing toward long term goals..  Continue plan of care.  OT Short Term Goals Week 2:  OT Short Term Goal 1 (Week 2): Pt will complete toilet transfers with S. OT Short Term Goal 1 - Progress (Week 2): Progressing toward goal OT Short Term Goal 2 (Week 2): Pt will complete 3/3 toileting tasks with S. OT Short Term Goal 2 - Progress (Week 2): Progressing toward goal OT Short Term Goal 3 (Week 2): Pt will be able to stand at sink with S to perform grooming tasks. OT Short Term Goal 3 - Progress (Week 2): Progressing toward goal OT Short Term Goal 4 (Week  2): Pt will use AE for bathing LB to increase safety with balance. OT Short Term Goal 4 - Progress (Week 2): Met OT Short Term Goal 5 (Week 2): Pt will use AE for donning socks and pants using his LUE as an active assist. OT Short Term Goal 5 - Progress (Week 2): Met Week 3:  OT Short Term Goal 1 (Week 3): Pt will be able to stand at sink with 75% S to perform grooming tasks. OT Short Term Goal 2 (Week 3): Pt will complete 2/3 toileting tasks with S. OT Short Term Goal 3 (Week 3): Pt wiill maintain sitting balance during daily task with supervision and verbal cues  Skilled Therapeutic Interventions/Progress Updates:    OT treatment session focused on modified dressing strategies, activity tolerance, and sitting/standing balance. Pt donned socks semi-reclined in bed using sock aid with increased time to coordinate hands to pull sock onto sock-aid. Sup<>sit with min A to elevate trunk and rotate hips. Pt achieved sitting balance with min A progressing to supervision. Squat-pivot transfer to wc with min A.  Pt able to don shirt sitting in wc with set-up A, increased time, and VC for where to grab shirt to pull down in back. Pt able to thread LLE through pants and needed min A to maintain sitting balance to thread R LE. Sit<>stand with min guard A. Worked on pt pulling pants over hips with pt experiencing lateral LOB when using B UEs to pull. Educated pt on importance of achieving balance by using alternating UE support to pull up pants. Pt then stayed standing to brush his teeth with min guard A and improved hip and ankle balance strategies. Pt shaved sitting  down with supervision and focus on abdominal strength to maintain upright position during activity. Pt left seated in wc with needs met at end of session.   Therapy Documentation Precautions:  Precautions Precautions: Fall Precaution Comments: new L hemiparesis; recent CVA with R sided weakness Restrictions Weight Bearing Restrictions: No Pain:   none/denies pain ADL: ADL ADL Comments: Please see functional navigator  See Function Navigator for Current Functional Status.   Therapy/Group: Individual Therapy  [name] 05/01/2017, 7:05 AM

## 2017-05-02 ENCOUNTER — Inpatient Hospital Stay (HOSPITAL_COMMUNITY): Payer: Medicare Other | Admitting: Occupational Therapy

## 2017-05-02 ENCOUNTER — Inpatient Hospital Stay (HOSPITAL_COMMUNITY): Payer: Medicare Other | Admitting: Physical Therapy

## 2017-05-02 ENCOUNTER — Encounter (HOSPITAL_COMMUNITY): Payer: Medicare Other | Admitting: Psychology

## 2017-05-02 ENCOUNTER — Inpatient Hospital Stay (HOSPITAL_COMMUNITY): Payer: Medicare Other | Admitting: Speech Pathology

## 2017-05-02 MED ORDER — DEXTROSE 5 % IV SOLN
2.0000 g | INTRAVENOUS | Status: AC
  Administered 2017-05-03 – 2017-05-05 (×3): 2 g via INTRAVENOUS
  Filled 2017-05-02 (×3): qty 2

## 2017-05-02 NOTE — Progress Notes (Signed)
Physical Therapy Session Note  Patient Details  Name: Brendan Charles MRN: [id number] Date of Birth: 30-Jul-1956  Today's Date: 05/02/2017 PT Individual Time: 1100-1145 PT Individual Time Calculation (min): 45 min   Short Term Goals: Week 2:  PT Short Term Goal 1 (Week 2): Pt will transfer with consistent min assist PT Short Term Goal 2 (Week 2): Pt will propel w/c x150' with no rest breaks and supervision PT Short Term Goal 3 (Week 2): Pt will initiate stair training with PT PT Short Term Goal 4 (Week 2): Pt will ambulate 30' with +1 assist  Skilled Therapeutic Interventions/Progress Updates:    no c/o pain throughout session; session focused on functional mobility and LE strengthening.   Pt propelled w/c using B UE 100' with supervision. Pt performed sit > stand with min A x 3 throughout session prior to ambulation, reported some dizziness upon standing and waited for dizziness to subside prior to walking. Pt ambulated 30' with mod A and RW, verbal cues for upright posture and sequencing in turns, as well as tactile cuing for upright posture. Pt required rest break and PT took blood pressure after resting a few minutes, 122/83. Pt performed sit > stand to retake blood pressure, 123/87 in standing, and pt experienced tearfulness and emotional spell. Pt required sitting rest break to collect himself and PT provided verbal encouragement. Pt performed sit > stand again to ambulate 30' with min A for steadying and weight shifting and verbal cuing for upright posture and turn sequencing. Pt required prolonged rest break due to fatigue and legs feeling tired. Pt performed sit > squat x 5 using arm rests and CGA to improve LE strength and work on forward weight shifting to help in standing. Pt returned to room, sitting in recliner with call bell and all needs addressed.   Therapy Documentation Precautions:  Precautions Precautions: Fall Precaution Comments: new L hemiparesis; recent CVA with R  sided weakness Restrictions Weight Bearing Restrictions: No   See Function Navigator for Current Functional Status.   Therapy/Group: Individual Therapy  Dossie Arbour 05/02/2017, 2:06 PM

## 2017-05-02 NOTE — Progress Notes (Signed)
Speech Language Pathology Daily Session Note  Patient Details  Name: Brendan Charles MRN: [id number] Date of Birth: 25-May-1956  Today's Date: 05/02/2017 SLP Individual Time: 1300-1355 SLP Individual Time Calculation (min): 55 min  Short Term Goals: Week 3: SLP Short Term Goal 1 (Week 3): Patient will perform pharyngeal strengthening exercises with supervision verbal cues for accuracy.  SLP Short Term Goal 2 (Week 3): Patient will perform 25 repetitions of EMST/IMST excerises with Min A verbal cues for accuracy and a self-percieved effort level of 8/10.  SLP Short Term Goal 3 (Week 3): Patient will utilize speech intelligibility strategies to maximize speech intelligibility at the phrase level to 75% with supervision verbal cues. SLP Short Term Goal 4 (Week 3): Patient will consume trials of thin liquids via cup without overt s/s of aspiration with supervision verbal cues for use of swallowing strategies to assess readiness for repeat MBS.  SLP Short Term Goal 5 (Week 3): Patient will demonstrate efficent mastication of Dys. 2 textures without overt s/s of aspiration with Min A verbal cues over 2 sessions prior to upgrade.   Skilled Therapeutic Interventions: Skilled treatment session focused on dysphagia and speech goals. Patient was Mod I for accuracy while performing 20 repetitions of EMST/IMST at a self-perceived effort level of 9/10. Patient also consumed trials of thin liquids via tsp and cup without overt s/s of aspiration and intermittent use of multiple swallows. Recommend continued trials with SLP 2/2 patient requiring a repeat MBS prior to upgrade due to silent aspiration. Patient's oval intensity appeared stronger today and patient was ~90% intelligible at the sentence level. Patient was extremely tearful throughout session and SLP provided emotional support. RN aware. Patient left upright in wheelchair with all needs within reach. Continue with current plan of care.           Function:   Cognition Comprehension Comprehension assist level: Follows basic conversation/direction with no assist  Expression   Expression assist level: Expresses basic 75 - 89% of the time/requires cueing 10 - 24% of the time. Needs helper to occlude trach/needs to repeat words.  Social Interaction Social Interaction assist level: Interacts appropriately with others with medication or extra time (anti-anxiety, antidepressant).  Problem Solving Problem solving assist level: Solves basic 90% of the time/requires cueing < 10% of the time  Memory Memory assist level: More than reasonable amount of time    Pain No/Denies Pain   Therapy/Group: Individual Therapy  Flavia Bruss 05/02/2017, 2:06 PM

## 2017-05-02 NOTE — Progress Notes (Signed)
Occupational Therapy Session Note  Patient Details  Name: Brendan Charles MRN: [id number] Date of Birth: 1955/08/17  Today's Date: 05/02/2017 OT Individual Time: 0900-1000 OT Individual Time Calculation (min): 60 min    Short Term Goals: Week 3:  OT Short Term Goal 1 (Week 3): Pt will be able to stand at sink with 75% S to perform grooming tasks. OT Short Term Goal 2 (Week 3): Pt will complete 2/3 toileting tasks with S. OT Short Term Goal 3 (Week 3): Pt wiill maintain sitting balance during daily task with supervision and verbal cues  Skilled Therapeutic Interventions/Progress Updates:    OT treatment session focused on activity tolerance, standing balance, B UE coordination, and modified bathing/dressing. Pt transferred sup<>sit with close supervision and increased time to elevate trunk. Pt achieved sitting balance with min guard A initially progressing to supervision. Squat-pivot transfer to L with min A. Pt propelled wc to the sink using B UEs.Placed wc a little further from the sink to encourage core activation to reach for bathing/grooming objects on counter. Pt needed verbal cues to engage core and not use UB to pull self forwards. Addressed standing balance with hip and ankle strategies w/o B UE support while washing bottom, then when pulling up pants. Pt needed min A overall with facilitation to promote anterior weight shift 2/2 posterior lean. Pt donned socks using sock-aid, then worked on modified strategies for donning B AFO's with min A. Pt left seated in wc at end of session with needs met.     See Function Navigator for Current Functional Status.   Therapy/Group: Individual Therapy  [name] 05/02/2017, 9:49 AM

## 2017-05-02 NOTE — Progress Notes (Signed)
Physical Therapy Weekly Progress Note  Patient Details  Name: Brendan Charles MRN: [id number] Date of Birth: 06/11/1956  Beginning of progress report period: April 21, 2017 End of progress report period: May 02, 2017  Today's Date: 05/02/2017 PT Individual Time: 5009-3818 PT Individual Time Calculation (min): 45 min   Patient has met 3 of 4 short term goals.  Pt has made excellent progress with therapy this week, despite being ill and missing some therapy time in the middle of this reporting period.  Pt currently requires supervision<>min guard for squat/pivot transfers and sit<>stand transfers with RW.  Pt requires consistent mod assist for ambulation up to 30', with L lateral lean, decreased R weight shift and RLE step length, and flexed posture.  Extended pt's LOS by one week to allow for continued progress with therapy in hopes of achieving min assist ambulatory level in household.    Patient continues to demonstrate the following deficits muscle weakness, impaired timing and sequencing, abnormal tone, unbalanced muscle activation, motor apraxia, ataxia and decreased coordination, decreased awareness, decreased safety awareness and delayed processing and decreased sitting balance, decreased standing balance, decreased postural control, hemiplegia and decreased balance strategies and therefore will continue to benefit from skilled PT intervention to increase functional independence with mobility.  Patient progressing toward long term goals..  Continue plan of care.  PT Short Term Goals Week 2:  PT Short Term Goal 1 (Week 2): Pt will transfer with consistent min assist PT Short Term Goal 1 - Progress (Week 2): Met PT Short Term Goal 2 (Week 2): Pt will propel w/c x150' with no rest breaks and supervision PT Short Term Goal 2 - Progress (Week 2): Progressing toward goal PT Short Term Goal 3 (Week 2): Pt will initiate stair training with PT PT Short Term Goal 3 - Progress (Week 2):  Met PT Short Term Goal 4 (Week 2): Pt will ambulate 30' with +1 assist PT Short Term Goal 4 - Progress (Week 2): Met Week 3:  PT Short Term Goal 1 (Week 3): Pt will ambulate 30' with RW and consistent min assist.  PT Short Term Goal 2 (Week 3): Pt will negotiate 5 steps with 1 rail and min assist PT Short Term Goal 3 (Week 3): Pt will attempt curb step negotiation with RW with PT.   Skilled Therapeutic Interventions/Progress Updates:    no c/o pain.  Session focus on therapeutic use of self and change of environment as pt very tearful throughout multiple therapy sessions today.  Pt taken outside for change of environment and engaged in conversation/pt education regarding d/c planning, things he enjoys and how to continue to enjoy those things, as well as continued expectation of progress and goals of therapy.  Pt returned to room at end of session, close supervision for squat/pivot transfer and bed mobility.  Left positioned to comfort with call bell in reach and needs met.   Therapy Documentation Precautions:  Precautions Precautions: Fall Precaution Comments: new L hemiparesis; recent CVA with R sided weakness Restrictions Weight Bearing Restrictions: No   See Function Navigator for Current Functional Status.  Therapy/Group: Individual Therapy  Michel Santee 05/02/2017, 4:28 PM

## 2017-05-02 NOTE — Consult Note (Signed)
Neuropsychological Consultation   Patient:   Brendan Charles   DOB:   Mar 15, 1956  MR Number:  [id number]  Location:  [facility] [address] [facility] [id number] [place] [address] Dept: [phone] Loc: [phone]           Date of Service:   04/24/2017  Start Time:   2 PM End Time:   3 PM  Provider/Observer:  Arley Phenix, Psy.D.       Clinical Neuropsychologist       Billing Code/Service: [id number] 4 Units  Chief Complaint:    Brendan Charles is a 61 year old right handed male with history of hep C and recent CVA with right hemiparesis due to left MCA infarct.  Admitted 8/13 and discharged 8/14 min assist.  04/12/2017 developed left sided weakness and numbness along with dysarthria.  MRI small acute right medullary infarct without hemorrhage or mass effect.  Left corona radiata basal ganglia infarct from 8/13 stroke.  Recommendation for physical medicine consult and admitted for CIR program.  Patient has continued to expressive issues with depression and adjustment issues following this most recent stroke.  He has recovered well from first one in August but has not recovered like before.  Tearful and continued expressive language issues.  Over past week tearfulness has improved and patient reports mood has been improving.  Reports that he is worried about how much help he will be able to get from family after discharge.  The patient has been having more issues with depression.  He has been fixated on loss of function and worried that he will have another if he can not figure out why he is having them is first place.  Reason for Service:  Brendan Charles was referred for neuropsychological consultation due to ongoing coping issues, depressvie reaction and expressive language issues.  Below is the HPI for the current admission.    HPI: Brendan Charles 61 year old right-handed male with history of tobacco abuse,  hepatitis C and recent CVA with right hemiparesis due to left MCA infarct admitted 03/20/2017 discharge 03/21/2017 ambulating minimal assist to 100 feet using a rolling walker. Discharged on aspirin and Plavix. Per chart review patient lives with family. Was using a walker prior to admission. 2 level home with bath and bedroom on main level and 5 steps to entry of home. Family can assist as needed. He was able to complete ADL tasks with supervision for safety. He was receiving home therapies with advanced home care. Patient was doing well until 04/12/2017 with left-sided weakness and numbnessas well as severe dysarthria. CT/MRI showed small acute right medullary infarct no associated hemorrhage or mass effect. Evolution of left corona radiata basal ganglia infarct since 03/20/2017. CT angiogram head and neck with no dissection or aneurysm. No large vessel occlusion. Recent echocardiogram with ejection fraction of 65% overall motion abnormality. Patient also with recent left ureteral stone seen by urology Dr. Cleotis Lema underwent a cystoscopy 03/23/2017 showing moderate hydronephrosis no masses or lesions. Plan was for stone extraction in approximately 4 weeks. He had been placed on Flomax. Patient currently remains on aspirin and Plavix as prior to admission. Subcutaneous Lovenox for DVT prophylaxis. Mechanical soft thin liquid diet. Physical therapy evaluation completed 04/12/2017 with recommendations of physical medicine rehabilitation consult. Patient was admitted for comprehensive rehabilitation program.  Current Status:  The patient is having adjustment disorder with primary depressive response.  Patient reporting increased issues of depression and  therapy is also reporting more tearfulness.   Behavioral Observation: Brendan Charles  presents as a 61 y.o.-year-old Right Caucasian Male who appeared his stated age. his dress was Appropriate and he was Well Groomed and his manners were Appropriate to  the situation.  his participation was indicative of Appropriate and Redirectable behaviors.  There were physical disabilities noted.  he displayed an appropriate level of cooperation and motivation.     Interactions:    Active Appropriate and Redirectable  Attention:   abnormal and attention span appeared shorter than expected for age  Memory:   abnormal; remote memory intact, recent memory impaired  Visuo-spatial:  within normal limits  Speech (Volume):  low  Speech:   non-fluent aphasia; slurred  Thought Process:  Coherent and Relevant  Though Content:  WNL;   Orientation:   person and time/date  Judgment:   Fair  Planning:   Fair  Affect:    Depressed and Tearful  Mood:    Depressed  Insight:   Present  Intelligence:   normal  Medical History:   Past Medical History:  Diagnosis Date  . Anxiety   . Chronic back pain   . CVA (cerebral vascular accident) (HCC) 03/20/2017  . Hepatitis C    HEP C, never treated, 2007   Family Med/Psych History:  Family History  Problem Relation Age of Onset  . Cancer Mother   . Colon cancer Neg Hx     Risk of Suicide/Violence: low Patient denies any SI or HI, although he does display significant issues with depression over loss of function.  Impression/DX:  Brendan Charles is a 61 year old right handed male with history of hep C and recent CVA with right hemiparesis due to left MCA infarct.  Admitted 8/13 and discharged 8/14 min assist.  04/12/2017 developed left sided weakness and numbness along with dysarthria.  MRI small acute right medullary infarct without hemorrhage or mass effect.  Left corona radiata basal ganglia infarct from 8/13 stroke.  Recommendation for physical medicine consult and admitted for CIR program.  Patient has continued to expressive issues with depression and adjustment issues following this most recent stroke.  He has recovered well from first one in August but has not recovered like before.  Tearful and  continued expressive language issues.  The patient is having adjustment disorder with primary depressive response.  Denies any significant anxiety symptoms.   Patient does have past history of anxiety disorder, the level of worry appears appropriate to current situation.  Depressive symptoms appear to have worsened.  This was the primary focus of interventions today.  Worked on Pharmacologist.  Reviewed other behavioral and mental status issues.         Electronically Signed   _______________________ Arley Phenix, Psy.D.

## 2017-05-02 NOTE — Progress Notes (Signed)
Subjective/Complaints:  No issues overnite, slept well  ROS: pt denies nausea, vomiting, diarrhea, cough, shortness of breath or chest pain    Objective: Vital Signs: Blood pressure 120/72, pulse 86, temperature 97.7 F (36.5 C), temperature source Oral, resp. rate 16, height _0  (1.803 m), weight 71.2 kg (157 lb), SpO2 96 %. No results found. Results for orders placed or performed during the hospital encounter of 04/13/17 (from the past 72 hour(s))  Basic metabolic panel     Status: Abnormal   Collection Time: 04/30/17  5:49 AM  Result Value Ref Range   Sodium 138 135 - 145 mmol/L   Potassium 3.8 3.5 - 5.1 mmol/L   Chloride 107 101 - 111 mmol/L   CO2 24 22 - 32 mmol/L   Glucose, Bld 94 65 - 99 mg/dL   BUN 16 6 - 20 mg/dL   Creatinine, Ser 0.66 0.61 - 1.24 mg/dL   Calcium 8.5 (L) 8.9 - 10.3 mg/dL   GFR calc non Af Amer >60 >60 mL/min   GFR calc Af Amer >60 >60 mL/min    Comment: (NOTE) The eGFR has been calculated using the CKD EPI equation. This calculation has not been validated in all clinical situations. eGFR's persistently <60 mL/min signify possible Chronic Kidney Disease.    Anion gap 7 5 - 15  CBC     Status: Abnormal   Collection Time: 04/30/17  5:49 AM  Result Value Ref Range   WBC 7.0 4.0 - 10.5 K/uL   RBC 3.85 (L) 4.22 - 5.81 MIL/uL   Hemoglobin 11.6 (L) 13.0 - 17.0 g/dL   HCT 35.1 (L) 39.0 - 52.0 %   MCV 91.2 78.0 - 100.0 fL   MCH 30.1 26.0 - 34.0 pg   MCHC 33.0 30.0 - 36.0 g/dL   RDW 14.6 11.5 - 15.5 %   Platelets 214 150 - 400 K/uL     HEENT: normal and dysarthric Cardio: RRR without murmur. No JVD   Resp: CTA Bilaterally without wheezes or rales. Normal effort   GI: BS positive and NT, ND Extremity:  Pulses positive and No Edema Skin:   Intact Neuro: Alert/Oriented, Cranial Nerve Abnormalities Left central VII, Normal Sensory, Abnormal Motor 3- Left delt, Bi, tri, finger flexor/ext, hip/knee extensor synergy, 2-/5 at ankle, 4/5 in RUE and RLE    and remains dysarthric. Less distracted today Musc/Skel:  Other no pain with UE or LE ROM Gen NAD   Assessment/Plan: 1. Functional deficits secondary to acute left hemiparesis, subacute right hemiparesis which require 3+ hours per day of interdisciplinary therapy in a comprehensive inpatient rehab setting. Physiatrist is providing close team supervision and 24 hour management of active medical problems listed below. Physiatrist and rehab team continue to assess barriers to discharge/monitor patient progress toward functional and medical goals. FIM: Function - Bathing Position: Shower Body parts bathed by patient: Right arm, Left arm, Chest, Abdomen, Front perineal area, Left upper leg, Right upper leg, Buttocks, Right lower leg, Left lower leg Body parts bathed by helper: Back Assist Level: Touching or steadying assistance(Pt > 75%), Assistive device Assistive Device Comment: long handled sponge  Function- Upper Body Dressing/Undressing What is the patient wearing?: Pull over shirt/dress Pull over shirt/dress - Perfomed by patient: Thread/unthread right sleeve, Thread/unthread left sleeve Pull over shirt/dress - Perfomed by helper: Put head through opening, Pull shirt over trunk Assist Level: Touching or steadying assistance(Pt > 75%) Function - Lower Body Dressing/Undressing What is the patient wearing?: Non-skid slipper socks, Pants Position:  Wheelchair/chair at sink Pants- Performed by patient: Thread/unthread left pants leg Pants- Performed by helper: Thread/unthread right pants leg, Pull pants up/down Non-skid slipper socks- Performed by patient: Don/doff right sock Non-skid slipper socks- Performed by helper: Don/doff left sock Shoes - Performed by helper: Don/doff right shoe, Don/doff left shoe Assist for footwear: Supervision/touching assist, Setup Assist for lower body dressing: Touching or steadying assistance (Pt > 75%) Assistive Device Comment: sock aid  Function -  Toileting Toileting activity did not occur: N/A Toileting steps completed by patient: Performs perineal hygiene Toileting steps completed by helper: Adjust clothing after toileting, Performs perineal hygiene Toileting Assistive Devices: Grab bar or rail Assist level: Touching or steadying assistance (Pt.75%)  Function - Air cabin crew transfer assistive device: Grab bar Assist level to toilet: Touching or steadying assistance (Pt > 75%) Assist level from toilet: Touching or steadying assistance (Pt > 75%)  Function - Chair/bed transfer Chair/bed transfer method: Squat pivot Chair/bed transfer assist level: Supervision or verbal cues Chair/bed transfer assistive device: Armrests, Bedrails Chair/bed transfer details: Verbal cues for precautions/safety, Verbal cues for technique, Tactile cues for weight shifting, Tactile cues for sequencing  Function - Locomotion: Wheelchair Will patient use wheelchair at discharge?: Yes Type: Manual Max wheelchair distance: 100 Assist Level: Supervision or verbal cues Assist Level: Supervision or verbal cues Wheel 150 feet activity did not occur: Safety/medical concerns Turns around,maneuvers to table,bed, and toilet,negotiates 3% grade,maneuvers on rugs and over doorsills: No Function - Locomotion: Ambulation Assistive device: Walker-rolling Max distance: 35 Assist level: Moderate assist (Pt 50 - 74%) Walk 10 feet activity did not occur: Safety/medical concerns Assist level: Touching or steadying assistance (Pt > 75%) Walk 50 feet with 2 turns activity did not occur: Safety/medical concerns Assist level: 2 helpers Walk 150 feet activity did not occur: Safety/medical concerns Walk 10 feet on uneven surfaces activity did not occur: Safety/medical concerns  Function - Comprehension Comprehension: Auditory Comprehension assist level: Follows basic conversation/direction with no assist  Function - Expression Expression: Verbal Expression  assist level: Expresses basic 75 - 89% of the time/requires cueing 10 - 24% of the time. Needs helper to occlude trach/needs to repeat words.  Function - Social Interaction Social Interaction assist level: Interacts appropriately with others with medication or extra time (anti-anxiety, antidepressant).  Function - Problem Solving Problem solving assist level: Solves basic 90% of the time/requires cueing < 10% of the time  Function - Memory Memory assist level: More than reasonable amount of time Patient normally able to recall (first 3 days only): That he or she is in a hospital  Medical Problem List and Plan: 1. Right hemiparesis as well as left side weakness/dysarthriasecondary to acute lacunar infarction involving the right medulla oblongata and medial aspect. Subacute large vessel left MCA infarct due to intracranial stenosis-    - CIR PT OT SLP      Pt lost a couple days with his e coli bacteremia  , no change in D/C date 10/5 anticipated 2. DVT Prophylaxis/Anticoagulation: Subcutaneous Lovenox. Monitor platelet counts of any signs of bleeding 3. Pain Management/chronic back pain: Oxycodone as needed 4. Mood: Xanax 0.5 mg 3 times a day as needed 5. Neuropsych: This patient iscapable of making decisions on hisown behalf. 6. Skin/Wound Care: Routine skin checks 7. Fluids/Electrolytes/Nutrition: Routine I&O's with follow-up chemistries 8.Hyperlipidemia. Lipitor 9.Tobacco abuse counseling 10.Recent left ureteral stone. Has hematuria potentiated by anticoagulants, last Hgb is 14.8 on 9/79/14 13.7  Continue Flomax. Follow-up urology services, has stent placement, scheduled for removal which  will be delayed by CVA, uro f/u as OP 11.Hypertension. Norvasc 5 mg daily. good control 9/25   Vitals:   05/01/17 2005 05/02/17 0344  BP: 118/69 120/72  Pulse: 98 86  Resp:  16  Temp:  97.7 F (36.5 C)  SpO2:  96%  12.  Mild leukocytosis, afebrile 13.  Tachycardia- no anemia, volume  status looks normal, EKG show sinus rhythm- will monitor   -increased metoprolol to 102m bid with good results, bp is ok 9/25 14.  Insomnia - remeron, His complaints are inconsistent. May need some relaxation strategies 15.  Pt reports no BM x 2 d, discussed constipation related to immobility 16.  Bladder incont with freq at noc no dysuria, likely spastic bladder  17.  Emotional lability vs adjustment d/o f/u neuropsych, pt does not want med management of this. 18.  UTI with bacteremia: ecoli in urine and blood, enterobacter in blood   -afebrile. Feels better  -continue ceftriaxone though 9/28     LOS (Days) 19 A FACE TO FACE EVALUATION WAS PERFORMED  Brendan Charles E 05/02/2017, 8:00 AM

## 2017-05-02 NOTE — Plan of Care (Signed)
Problem: RH BOWEL ELIMINATION Goal: RH STG MANAGE BOWEL WITH ASSISTANCE STG Manage Bowel with Mod I Assistance.   Outcome: Not Progressing Max assist   Problem: RH BLADDER ELIMINATION Goal: RH STG MANAGE BLADDER WITH ASSISTANCE STG Manage Bladder With Min Assistance   Outcome: Not Progressing Total assist

## 2017-05-02 NOTE — Progress Notes (Signed)
Report received by therapy staff patient emotional and crying at times. Patient reports he is upset about having strokes and tired of hospital. Patient reports he understands and appreciates help from hospital.Continue to provide emotional support. Brendan Charles

## 2017-05-03 ENCOUNTER — Inpatient Hospital Stay (HOSPITAL_COMMUNITY): Payer: Medicare Other | Admitting: Physical Therapy

## 2017-05-03 ENCOUNTER — Inpatient Hospital Stay (HOSPITAL_COMMUNITY): Payer: Medicare Other | Admitting: Occupational Therapy

## 2017-05-03 ENCOUNTER — Inpatient Hospital Stay (HOSPITAL_COMMUNITY): Payer: Medicare Other | Admitting: Speech Pathology

## 2017-05-03 NOTE — Progress Notes (Signed)
Physical Therapy Session Note  Patient Details  Name: Brendan Charles MRN: [id number] Date of Birth: Aug 17, 1955  Today's Date: 05/03/2017 PT Individual Time: 1400-1445 PT Individual Time Calculation (min): 45 min   Short Term Goals: Week 3:  PT Short Term Goal 1 (Week 3): Pt will ambulate 30' with RW and consistent min assist.  PT Short Term Goal 2 (Week 3): Pt will negotiate 5 steps with 1 rail and min assist PT Short Term Goal 3 (Week 3): Pt will attempt curb step negotiation with RW with PT.   Skilled Therapeutic Interventions/Progress Updates:    no c/o pain throughout session; session focused on functional mobility and LE strengthening.   Pt transferred squat pivot transfer with supervision throughout session. Pt educated on how to remove and put on leg rests with wheelchair. Pt practiced proper set up for transfers with supervision, including positioning the wheelchair, managing the brakes and leg rests, and moving the arm rest. Pt performed NuStep x 10 min on workload 5 to work on strengthening LEs. Pt required frequent 30 sec rest breaks throughout NuStep with one 2 min rest break at 6:29. Pt reported feeling fatigued in legs but wanted to continue working through time on NuStep. Pt demonstrated dynamic sitting balance when taking off shoes with AFOs and removing socks sitting EOB. Pt left supine with call bell within reach and all needs addressed.   Therapy Documentation Precautions:  Precautions Precautions: Fall Precaution Comments: new L hemiparesis; recent CVA with R sided weakness Restrictions Weight Bearing Restrictions: No  See Function Navigator for Current Functional Status.   Therapy/Group: Individual Therapy  Dossie Arbour 05/03/2017, 4:33 PM

## 2017-05-03 NOTE — Patient Care Conference (Signed)
Inpatient RehabilitationTeam Conference and Plan of Care Update Date: 05/03/2017   Time: 11:00 Am    Patient Name: Brendan Charles      Medical Record Number: [id number]  Date of Birth: Aug 22, 1955 Sex: Male         Room/Bed: 4M07C/4M07C-01 Payor Info: Payor: Multimedia programmer / Plan: [employer] MEDICARE / Product Type: *No Product type* /    Admitting Diagnosis: cva  Admit Date/Time:  04/13/2017  3:08 PM Admission Comments: No comment available   Primary Diagnosis:  Spastic hemiparesis of left dominant side as late effect of cerebral infarction [facility]) Principal Problem: Spastic hemiparesis of left dominant side as late effect of cerebral infarction [facility])  Patient Active Problem List   Diagnosis Date Noted  . Spastic hemiparesis of left dominant side as late effect of cerebral infarction (HCC) 04/19/2017  . Gait disturbance, post-stroke 04/18/2017  . Adjustment disorder with depressed mood   . Cerebral infarction due to thrombosis of right middle cerebral artery (HCC) 04/13/2017  . Weakness   . Acute ischemic stroke (HCC) 04/12/2017  . Stroke (cerebrum) (HCC) 04/12/2017  . Acute CVA (cerebrovascular accident) (HCC) 04/12/2017  . CVA (cerebral vascular accident) (HCC) 03/20/2017  . Hydronephrosis, left 03/20/2017  . Aortic atherosclerosis (HCC) 03/20/2017  . Hepatitis C 02/14/2017  . Encounter for screening colonoscopy 02/14/2017  . Spondylolisthesis at L3-L4 level 06/03/2014    Expected Discharge Date: Expected Discharge Date: 05/12/17  Team Members Present: Physician leading conference: Dr. Claudette Laws Social Worker Present: Dossie Der, LCSW Nurse Present: Allayne Stack, RN PT Present: Teodoro Kil, PT OT Present: Kearney Hard, OT SLP Present: Feliberto Gottron, SLP PPS Coordinator present : Tora Duck, RN, CRRN     Current Status/Progress Goal Weekly Team Focus  Medical   Remains depressed, has UTI with bacteremia  Increase intake, decreased fall risk  IV  antibiotic therapy   Bowel/Bladder   incontinent of bowel and bladder; urgency and frequency; wears condom cath at night to promote rest; patient can be continent with encouragement  Min assist  Assess and treat for constipation as needed; utilize assistive devices to promote continence   Swallow/Nutrition/ Hydration   Dys. 1 textures with nectar-thick liquids, water protocol, Mod I  Mod I  water protocol, pharyngeal strengthening exercises   ADL's   Min A/supervision overall  Supervision/mod I goals  Postural control, activity tolerance, LB and UB strengthening, standing balance, sitting balance,    Mobility   supervision>min for transfers, min/mod for gait with RW, trunk control improving in sitting/standing but still poor  supervision for transfers and w/c mobility, min for gait/stairs  NMR, balance, gait, transfers, postural control    Communication   Min-Mod A  Min A  speech intelligibility strategies, RMT   Safety/Cognition/ Behavioral Observations            Pain   Denies pain  < 3  Assess and treat for pain q shift and prn   Skin   No skin breakdown noted  No skin breakdown or infection with min assist  Assess skin q shift and prn      *See Care Plan and progress notes for long and short-term goals.     Barriers to Discharge  Current Status/Progress Possible Resolutions Date Resolved   Physician    Inaccessible home environment;Medical stability;Behavior  On IV antibiotics, incontinent bowel, bladder  Progressing towards goals  Intensify efforts for bowel and bladder program      Nursing  PT                    OT                  SLP                SW                Discharge Planning/Teaching Needs:    Home with son's providing 24 hr care between them both. Pt wants to be as close to mod/i as possible before going home     Team Discussion:  UTI being treated and IV antibiotics until Friday 9/28. IV hydration still 7p-7a hopefully after MBS on  Monday can DC these. Vestibular eval pending due to pt becomes dizzy in therapies. Trials of water and Dys 2. Voice is stronger. AFO's pt can put on himself. On target to meet supervision level goals.   Revisions to Treatment Plan:  DC 10/5    Continued Need for Acute Rehabilitation Level of Care: The patient requires daily medical management by a physician with specialized training in physical medicine and rehabilitation for the following conditions: Daily direction of a multidisciplinary physical rehabilitation program to ensure safe treatment while eliciting the highest outcome that is of practical value to the patient.: Yes Daily medical management of patient stability for increased activity during participation in an intensive rehabilitation regime.: Yes Daily analysis of laboratory values and/or radiology reports with any subsequent need for medication adjustment of medical intervention for : Neurological problems;Mood/behavior problems;Urological problems  Lucy Chris 05/03/2017, 12:43 PM

## 2017-05-03 NOTE — Progress Notes (Signed)
Occupational Therapy Session Note  Patient Details  Name: Brendan Charles MRN: [id number] Date of Birth: [birth year]  Today's Date: 05/03/2017 OT Individual Time: 0935-1030 OT Individual Time Calculation (min): 55 min   Short Term Goals: Week 3:  OT Short Term Goal 1 (Week 3): Pt will be able to stand at sink with 75% S to perform grooming tasks. OT Short Term Goal 2 (Week 3): Pt will complete 2/3 toileting tasks with S. OT Short Term Goal 3 (Week 3): Pt wiill maintain sitting balance during daily task with supervision and verbal cues  Skilled Therapeutic Interventions/Progress Updates:    OT treatment session focused on standing/sitting balance, postural control, and activity tolerance within modified bathing/dressing tasks. Pt came to sitting with HOB elevated and supervision. Supervision for sitting balance, then transferred to wc via stand-pivot with min guard A. Pt propelled wc into bathroom with min A to position at tub bench. Stand-pivot into shower with min guard. Pt bathed with set-up A and min guard A for sitting balance when leaning side to side to wash buttocks. Pt dressed sit<>stand from wc in bathroom using grab bars for support. Pt able to thread both pant legs today with improved trunk control.  Pt also demonstrated mproved standing balance today and only needed intermittent min guard A when reaching to pull up pants. Pt donned socks using sock-aid.  Pt demonstrated improved postural control and hip strength to lift legs and also reach forward to don shoes. He was able to don both shoes, B AFOs, and fasten shoes with increased time, set-up and verbal cues! Pt left seated in wc at end of session with safety belt on and needs met.   Therapy Documentation Precautions:  Precautions Precautions: Fall Precaution Comments: new L hemiparesis; recent CVA with R sided weakness Restrictions Weight Bearing Restrictions: No Pain: Pain Assessment Pain Assessment: No/denies pain Pain  Score: 0-No pain ADL: ADL ADL Comments: Please see functional navigator   See Function Navigator for Current Functional Status.   Therapy/Group: Individual Therapy  [name] 05/03/2017, 10:00 AM

## 2017-05-03 NOTE — Progress Notes (Signed)
Physical Therapy Session Note  Patient Details  Name: Brendan Charles MRN: [id number] Date of Birth: 1956/07/12  Today's Date: 05/03/2017 PT Individual Time: 1130-1200 PT Individual Time Calculation (min): 30 min   Short Term Goals: Week 3:  PT Short Term Goal 1 (Week 3): Pt will ambulate 30' with RW and consistent min assist.  PT Short Term Goal 2 (Week 3): Pt will negotiate 5 steps with 1 rail and min assist PT Short Term Goal 3 (Week 3): Pt will attempt curb step negotiation with RW with PT.   Skilled Therapeutic Interventions/Progress Updates:    no c/o pain throughout session; pt well-rested and ready to work in PT. Session focused on gait training and functional mobility.   Pt performed sit > stand throughout session with min A for steadying and keeping weight forward over toes. Pt amb 40' with min A and rolling walker, v/c for upright posture with intermittent tactile cuing for upright posture. Pt required rest break between ambulations. Pt amb 40' with min A and rolling walker back to w/c, v/c only for upright posture and walker positioning in relation to body. Pt amb 20' with mod A 2/2 fatigue and rolling walker, pt expressed increased exertion during last ambulation. Pt transferred w/c > recliner with supervision and set up assistance. Pt left up in recliner with call bell and all needs addressed.   Therapy Documentation Precautions:  Precautions Precautions: Fall Precaution Comments: new L hemiparesis; recent CVA with R sided weakness Restrictions Weight Bearing Restrictions: No   See Function Navigator for Current Functional Status.   Therapy/Group: Individual Therapy  Dossie Arbour 05/03/2017, 12:22 PM

## 2017-05-03 NOTE — Progress Notes (Signed)
Subjective/Complaints:    ROS: pt denies nausea, vomiting, diarrhea, cough, shortness of breath or chest pain    Objective: Vital Signs: Blood pressure 120/71, pulse 80, temperature 98.9 F (37.2 C), temperature source Oral, resp. rate 16, height _0  (1.803 m), weight 71.5 kg (157 lb 11.1 oz), SpO2 97 %. No results found. No results found for this or any previous visit (from the past 72 hour(s)).   HEENT: normal and dysarthric Cardio: RRR without murmur. No JVD   Resp: CTA Bilaterally without wheezes or rales. Normal effort   GI: BS positive and NT, ND Extremity:  Pulses positive and No Edema Skin:   Intact Neuro: Alert/Oriented, Cranial Nerve Abnormalities Left central VII, Normal Sensory, Abnormal Motor 3- Left delt, Bi, tri, finger flexor/ext, hip/knee extensor synergy, 2-/5 at ankle, 4/5 in RUE and RLE   and remains dysarthric. Less distracted today Musc/Skel:  Other no pain with UE or LE ROM Gen NAD   Assessment/Plan: 1. Functional deficits secondary to acute left hemiparesis, subacute right hemiparesis which require 3+ hours per day of interdisciplinary therapy in a comprehensive inpatient rehab setting. Physiatrist is providing close team supervision and 24 hour management of active medical problems listed below. Physiatrist and rehab team continue to assess barriers to discharge/monitor patient progress toward functional and medical goals. FIM: Function - Bathing Position: Wheelchair/chair at sink Body parts bathed by patient: Right arm, Left arm, Chest, Abdomen, Front perineal area, Right upper leg, Left upper leg Body parts bathed by helper: Buttocks, Right lower leg, Left lower leg Assist Level: Touching or steadying assistance(Pt > 75%) Assistive Device Comment: long handled sponge  Function- Upper Body Dressing/Undressing What is the patient wearing?: Pull over shirt/dress Pull over shirt/dress - Perfomed by patient: Thread/unthread right sleeve,  Thread/unthread left sleeve, Put head through opening, Pull shirt over trunk Pull over shirt/dress - Perfomed by helper: Put head through opening, Pull shirt over trunk Assist Level: Supervision or verbal cues Function - Lower Body Dressing/Undressing What is the patient wearing?: AFO, Shoes, Socks, Pants Position: Wheelchair/chair at sink Pants- Performed by patient: Thread/unthread right pants leg Pants- Performed by helper: Pull pants up/down, Thread/unthread left pants leg Non-skid slipper socks- Performed by patient: Don/doff right sock Non-skid slipper socks- Performed by helper: Don/doff left sock Socks - Performed by patient: Don/doff right sock, Don/doff left sock Shoes - Performed by helper: Don/doff right shoe, Don/doff left shoe, Fasten right, Fasten left AFO - Performed by helper: Don/doff left AFO, Don/doff right AFO Assist for footwear: Supervision/touching assist Assist for lower body dressing: Touching or steadying assistance (Pt > 75%) Assistive Device Comment: sock aid  Function - Toileting Toileting activity did not occur: N/A Toileting steps completed by patient: Performs perineal hygiene Toileting steps completed by helper: Adjust clothing after toileting, Performs perineal hygiene Toileting Assistive Devices: Grab bar or rail Assist level: Touching or steadying assistance (Pt.75%)  Function - Air cabin crew transfer assistive device: Grab bar Assist level to toilet: Touching or steadying assistance (Pt > 75%) Assist level from toilet: Touching or steadying assistance (Pt > 75%)  Function - Chair/bed transfer Chair/bed transfer method: Squat pivot Chair/bed transfer assist level: Supervision or verbal cues Chair/bed transfer assistive device: Armrests, Bedrails Chair/bed transfer details: Verbal cues for sequencing  Function - Locomotion: Wheelchair Will patient use wheelchair at discharge?: Yes Type: Manual Max wheelchair distance: 100 Assist  Level: Supervision or verbal cues Assist Level: Supervision or verbal cues Wheel 150 feet activity did not occur: Safety/medical concerns Turns around,maneuvers to  table,bed, and toilet,negotiates 3% grade,maneuvers on rugs and over doorsills: No Function - Locomotion: Ambulation Assistive device: Walker-rolling Max distance: 30 Assist level: Moderate assist (Pt 50 - 74%) Walk 10 feet activity did not occur: Safety/medical concerns Assist level: Touching or steadying assistance (Pt > 75%) Walk 50 feet with 2 turns activity did not occur: Safety/medical concerns Assist level: 2 helpers Walk 150 feet activity did not occur: Safety/medical concerns Walk 10 feet on uneven surfaces activity did not occur: Safety/medical concerns  Function - Comprehension Comprehension: Auditory Comprehension assist level: Follows basic conversation/direction with no assist  Function - Expression Expression: Verbal Expression assist level: Expresses basic 75 - 89% of the time/requires cueing 10 - 24% of the time. Needs helper to occlude trach/needs to repeat words.  Function - Social Interaction Social Interaction assist level: Interacts appropriately with others with medication or extra time (anti-anxiety, antidepressant).  Function - Problem Solving Problem solving assist level: Solves basic 90% of the time/requires cueing < 10% of the time  Function - Memory Memory assist level: More than reasonable amount of time Patient normally able to recall (first 3 days only): That he or she is in a hospital  Medical Problem List and Plan: 1. Right hemiparesis as well as left side weakness/dysarthriasecondary to acute lacunar infarction involving the right medulla oblongata and medial aspect. Subacute large vessel left MCA infarct due to intracranial stenosis-    - CIR PT OT SLP     Team conference today please see physician documentation under team conference tab, met with team face-to-face to discuss  problems,progress, and goals. Formulized individual treatment plan based on medical history, underlying problem and comorbidities.2. DVT Prophylaxis/Anticoagulation: Subcutaneous Lovenox. Monitor platelet counts of any signs of bleeding 3. Pain Management/chronic back pain: Oxycodone as needed 4. Mood: Xanax 0.5 mg 3 times a day as needed 5. Neuropsych: This patient iscapable of making decisions on hisown behalf. 6. Skin/Wound Care: Routine skin checks 7. Fluids/Electrolytes/Nutrition: Routine I&O's with follow-up chemistries 8.Hyperlipidemia. Lipitor 9.Tobacco abuse counseling 10.Recent left ureteral stone. Has hematuria potentiated by anticoagulants, last Hgb is 14.8 on 9/79/14 13.7  Continue Flomax. Follow-up urology services, has stent placement, scheduled for removal which will be delayed by CVA, uro f/u as OP 11.Hypertension. Norvasc 5 mg daily. good control 9/26   Vitals:   05/02/17 2017 05/03/17 0305  BP: 130/81 120/71  Pulse: 92 80  Resp:  16  Temp:  98.9 F (37.2 C)  SpO2:  97%  12.  Mild leukocytosis, afebrile 13.  Tachycardia- no anemia, volume status looks normal, EKG show sinus rhythm- will monitor   -increased metoprolol to 92m bid with good results, bp is ok 9/26 14.  Insomnia - remeron, His complaints are inconsistent. May need some relaxation strategies 15. loose inc stools, increased freq since rocephin, no indication for  Checking C diff toxin currently 16.  Bladder incont with freq at noc no dysuria, likely spastic bladder  17.  Post CVA depression on Remeron 18.  UTI with bacteremia: ecoli in urine and blood, enterobacter in blood   -afebrile.  -continue ceftriaxone though 9/28     LOS (Days) 20 A FACE TO FACE EVALUATION WAS PERFORMED  KIRSTEINS,ANDREW E 05/03/2017, 8:03 AM

## 2017-05-03 NOTE — Plan of Care (Signed)
Problem: RH BLADDER ELIMINATION Goal: RH STG MANAGE BLADDER WITH ASSISTANCE STG Manage Bladder With Min Assistance   Outcome: Not Progressing Incontinent this shift.  Problem: RH SAFETY Goal: RH STG DECREASED RISK OF FALL WITH ASSISTANCE STG Decreased Risk of Fall With Supervision Assistance.   Outcome: Not Progressing 2+ assist for transfer.

## 2017-05-03 NOTE — Plan of Care (Signed)
Problem: RH BLADDER ELIMINATION Goal: RH STG MANAGE BLADDER WITH ASSISTANCE STG Manage Bladder With Min Assistance   Outcome: Not Progressing Condom cath in use at [facility]

## 2017-05-03 NOTE — Progress Notes (Signed)
Social Work Patient ID: Brendan Charles, male   DOB: 1956/01/01, 61 y.o.   MRN: [id number]  Met with pt to discuss team conference progress toward his goals and target discharge 10/5. He is feeling Much better and can see the difference in therapies. He wants to be as independent as he can be before going home. Aware MBS on Monday to see if can upgrade his diet. Will contact son to try to Schedule family education prior to discharge next Friday. Pt is in agreement with this.

## 2017-05-03 NOTE — Progress Notes (Addendum)
Speech Language Pathology Daily Session Note  Patient Details  Name: Brendan Charles MRN: [id number] Date of Birth: 1956-03-09  Today's Date: 05/03/2017 SLP Individual Time: 0830-0930 SLP Individual Time Calculation (min): 60 min  Short Term Goals: Week 3: SLP Short Term Goal 1 (Week 3): Patient will perform pharyngeal strengthening exercises with supervision verbal cues for accuracy.  SLP Short Term Goal 2 (Week 3): Patient will perform 25 repetitions of EMST/IMST excerises with Min A verbal cues for accuracy and a self-percieved effort level of 8/10.  SLP Short Term Goal 3 (Week 3): Patient will utilize speech intelligibility strategies to maximize speech intelligibility at the phrase level to 75% with supervision verbal cues. SLP Short Term Goal 4 (Week 3): Patient will consume trials of thin liquids via cup without overt s/s of aspiration with supervision verbal cues for use of swallowing strategies to assess readiness for repeat MBS.  SLP Short Term Goal 5 (Week 3): Patient will demonstrate efficent mastication of Dys. 2 textures without overt s/s of aspiration with Min A verbal cues over 2 sessions prior to upgrade.   Skilled Therapeutic Interventions: Skilled treatment session focused on dysphagia and speech goals. SLP facilitated session by providing Supervision verbal cues for accuracy while performing 25 repetitions of EMST/IMST at a self-perceived effort level of 8-9/10. Patient also able to perform the [facility] strengthening exercises) X 1! Patient consumed trials of thin liquids via cup  and trials of Dys. 2 textures without overt s/s of aspiration, efficient mastication without oral residue, and intermittent supervision verbal cues needed for use of multiple swallows. Patient was also required supervision for use of speech intelligibility strategies at the conversation level. Patient left upright in bed with all needs within reach. Continue with current plan of care.       Function:  Eating Eating   Modified Consistency Diet: Yes Eating Assist Level: More than reasonable amount of time;Set up assist for   Eating Set Up Assist For: Opening containers       Cognition Comprehension Comprehension assist level: Follows basic conversation/direction with no assist  Expression   Expression assist level: Expresses basic 75 - 89% of the time/requires cueing 10 - 24% of the time. Needs helper to occlude trach/needs to repeat words.  Social Interaction Social Interaction assist level: Interacts appropriately with others with medication or extra time (anti-anxiety, antidepressant).  Problem Solving Problem solving assist level: Solves basic 90% of the time/requires cueing < 10% of the time  Memory Memory assist level: More than reasonable amount of time    Pain Pain Assessment Pain Assessment: No/denies pain Pain Score: 0-No pain  Therapy/Group: Individual Therapy  Carri Spillers 05/03/2017, 11:06 AM

## 2017-05-03 NOTE — Progress Notes (Signed)
Recreational Therapy Session Note  Patient Details  Name: Brendan Charles MRN: [id number] Date of Birth: 1956/01/17 Today's Date: 05/03/2017  Pt participated in animal assisted activity/therapy seated in recliner with supervision.  Pt tearful during session but appreciative of the visit. [name] 05/03/2017, 2:39 PM

## 2017-05-04 ENCOUNTER — Inpatient Hospital Stay (HOSPITAL_COMMUNITY): Payer: Medicare Other | Admitting: Occupational Therapy

## 2017-05-04 ENCOUNTER — Inpatient Hospital Stay (HOSPITAL_COMMUNITY): Payer: Medicare Other

## 2017-05-04 ENCOUNTER — Inpatient Hospital Stay (HOSPITAL_COMMUNITY): Payer: Medicare Other | Admitting: Speech Pathology

## 2017-05-04 NOTE — Progress Notes (Signed)
Physical Therapy Session Note  Patient Details  Name: Brendan Charles MRN: [id number] Date of Birth: June 18, 1956  Today's Date: 05/04/2017 PT Individual Time: 1030-1100 PT Individual Time Calculation (min): 30 min   Short Term Goals: Week 3:  PT Short Term Goal 1 (Week 3): Pt will ambulate 30' with RW and consistent min assist.  PT Short Term Goal 2 (Week 3): Pt will negotiate 5 steps with 1 rail and min assist PT Short Term Goal 3 (Week 3): Pt will attempt curb step negotiation with RW with PT.   Skilled Therapeutic Interventions/Progress Updates:   Session focused on NMR to address controlled transitional movements during bed mobility and sit <> stands, postural control re-training in standing, and gaze stabilization during transitional movements. Pt requires close supervision to min assist for sit <> stand using RW and then progressed to blocked practice sit <> stands without AD with min to light mod assist for sit <> stands and min to mod assist for balance due to impaired postural control. Pt with tendency for posterior bias in standing and focused on equal weightbearing to L and anterior weightshift to correct for LOB without UE support. Pt reports dizziness at 8/10 but with gaze stabilization dizziness decreased to 4/10. Returned to supine to await OT for next session.   Therapy Documentation Precautions:  Precautions Precautions: Fall Precaution Comments: new L hemiparesis; recent CVA with R sided weakness Restrictions Weight Bearing Restrictions: No   Pain: Pain Assessment Pain Assessment: No/denies pain Pain Score: 0-No pain   See Function Navigator for Current Functional Status.   Therapy/Group: Individual Therapy  Karolee Stamps Darrol Poke, PT, DPT  05/04/2017, 11:51 AM

## 2017-05-04 NOTE — Progress Notes (Signed)
Physical Therapy Session Note  Patient Details  Name: Brendan Charles MRN: [id number] Date of Birth: 05-27-1956  Today's Date: 05/04/2017 PT Individual Time: 1305-1400 PT Individual Time Calculation (min): 55 min   Short Term Goals: Week 3:  PT Short Term Goal 1 (Week 3): Pt will ambulate 30' with RW and consistent min assist.  PT Short Term Goal 2 (Week 3): Pt will negotiate 5 steps with 1 rail and min assist PT Short Term Goal 3 (Week 3): Pt will attempt curb step negotiation with RW with PT.   Skilled Therapeutic Interventions/Progress Updates:   Pt finishing up lunch and session started late due to late lunch tray. D/c planning and goals addressed while pt finishing lunch. W/c propulsion with BUE for general mobility training and functional endurance and coordination of BUE with supervision. Functional sit <> stands with overall min assist and verbal/tactile cues for anterior weightshift during power up and min assist for balance. Gait training with RW and BLE AFO's (orthotist present to observe for part of session and agreeable that these AFO's will be best fit for patient. Thayer Ohm will add toe cap to both shoes for smother clearance on uneven surfaces) x 55', x 30', and x 25' with overall min assist and cues for foot placement. Most difficulty noted during turns. NMR on stair negotiation for coordination and functional strengthening with L rail only with overall min assist and cues for foot placement for safety. Returned to bed end of session with min assist for transfer and returned to supine. Son present to observe.   Therapy Documentation Precautions:  Precautions Precautions: Fall Precaution Comments: new L hemiparesis; recent CVA with R sided weakness Restrictions Weight Bearing Restrictions: No  Pain:  reports back pain from sitting in w/c - mobility during therapy and back to bed end of session.    See Function Navigator for Current Functional Status.   Therapy/Group:  Individual Therapy  Karolee Stamps Darrol Poke, PT, DPT  05/04/2017, 3:27 PM

## 2017-05-04 NOTE — Progress Notes (Signed)
Subjective/Complaints:  No issues overnite  ROS: pt denies nausea, vomiting, diarrhea, cough, shortness of breath or chest pain    Objective: Vital Signs: Blood pressure 104/68, pulse 80, temperature 98 F (36.7 C), temperature source Oral, resp. rate 20, height  (1.803 m), weight 71.5 kg (157 lb 11.1 oz), SpO2 99 %. No results found. No results found for this or any previous visit (from the past 72 hour(s)).   HEENT: normal and dysarthric Cardio: RRR without murmur. No JVD   Resp: CTA Bilaterally without wheezes or rales. Normal effort   GI: BS positive and NT, ND Extremity:  Pulses positive and No Edema Skin:   Intact Neuro: Alert/Oriented, Cranial Nerve Abnormalities Left central VII, Normal Sensory, Abnormal Motor 3- Left delt, Bi, tri, finger flexor/ext, hip/knee extensor synergy, 2-/5 at ankle, 4/5 in RUE and RLE   and remains dysarthric. Less distracted today Musc/Skel:  Other no pain with UE or LE ROM Gen NAD   Assessment/Plan: 1. Functional deficits secondary to acute left hemiparesis, subacute right hemiparesis which require 3+ hours per day of interdisciplinary therapy in a comprehensive inpatient rehab setting. Physiatrist is providing close team supervision and 24 hour management of active medical problems listed below. Physiatrist and rehab team continue to assess barriers to discharge/monitor patient progress toward functional and medical goals. FIM: Function - Bathing Position: Shower Body parts bathed by patient: Right arm, Left arm, Chest, Abdomen, Front perineal area, Right upper leg, Left upper leg, Left lower leg, Right lower leg Body parts bathed by helper: Buttocks Assist Level: Touching or steadying assistance(Pt > 75%) Assistive Device Comment: long handled sponge  Function- Upper Body Dressing/Undressing What is the patient wearing?: Pull over shirt/dress Pull over shirt/dress - Perfomed by patient: Thread/unthread right sleeve, Thread/unthread  left sleeve, Put head through opening, Pull shirt over trunk Pull over shirt/dress - Perfomed by helper: Put head through opening, Pull shirt over trunk Assist Level: Supervision or verbal cues Function - Lower Body Dressing/Undressing What is the patient wearing?: Pants, Non-skid slipper socks Position: Wheelchair/chair at sink Pants- Performed by patient: Thread/unthread right pants leg, Thread/unthread left pants leg Pants- Performed by helper: Pull pants up/down Non-skid slipper socks- Performed by patient: Don/doff right sock, Don/doff left sock Non-skid slipper socks- Performed by helper: Don/doff left sock Socks - Performed by patient: Don/doff right sock, Don/doff left sock Shoes - Performed by helper: Don/doff right shoe, Don/doff left shoe, Fasten right, Fasten left AFO - Performed by helper: Don/doff left AFO, Don/doff right AFO Assist for footwear: Supervision/touching assist Assist for lower body dressing: Touching or steadying assistance (Pt > 75%) Assistive Device Comment: sock aid  Function - Toileting Toileting activity did not occur: N/A Toileting steps completed by patient: Performs perineal hygiene Toileting steps completed by helper: Adjust clothing after toileting, Performs perineal hygiene Toileting Assistive Devices: Grab bar or rail Assist level: Touching or steadying assistance (Pt.75%)  Function - Archivist transfer assistive device: Grab bar Assist level to toilet: Touching or steadying assistance (Pt > 75%) Assist level from toilet: Touching or steadying assistance (Pt > 75%)  Function - Chair/bed transfer Chair/bed transfer method: Squat pivot Chair/bed transfer assist level: Supervision or verbal cues Chair/bed transfer assistive device: Armrests Chair/bed transfer details: Verbal cues for sequencing  Function - Locomotion: Wheelchair Will patient use wheelchair at discharge?: Yes Type: Manual Max wheelchair distance: 100 Assist  Level: Supervision or verbal cues Assist Level: Supervision or verbal cues Wheel 150 feet activity did not occur: Safety/medical concerns Turns  around,maneuvers to table,bed, and toilet,negotiates 3% grade,maneuvers on rugs and over doorsills: No Function - Locomotion: Ambulation Assistive device: Walker-rolling Max distance: 40 Assist level: Touching or steadying assistance (Pt > 75%) Walk 10 feet activity did not occur: Safety/medical concerns Assist level: Touching or steadying assistance (Pt > 75%) Walk 50 feet with 2 turns activity did not occur: Safety/medical concerns Assist level: 2 helpers Walk 150 feet activity did not occur: Safety/medical concerns Walk 10 feet on uneven surfaces activity did not occur: Safety/medical concerns  Function - Comprehension Comprehension: Auditory Comprehension assist level: Follows basic conversation/direction with no assist  Function - Expression Expression: Verbal Expression assist level: Expresses basic 75 - 89% of the time/requires cueing 10 - 24% of the time. Needs helper to occlude trach/needs to repeat words.  Function - Social Interaction Social Interaction assist level: Interacts appropriately with others with medication or extra time (anti-anxiety, antidepressant).  Function - Problem Solving Problem solving assist level: Solves basic 90% of the time/requires cueing < 10% of the time  Function - Memory Memory assist level: More than reasonable amount of time Patient normally able to recall (first 3 days only): That he or she is in a hospital  Medical Problem List and Plan: 1. Right hemiparesis as well as left side weakness/dysarthriasecondary to acute lacunar infarction involving the right medulla oblongata and medial aspect. Subacute large vessel left MCA infarct due to intracranial stenosis-    - CIR PT OT SLP     .2. DVT Prophylaxis/Anticoagulation: Subcutaneous Lovenox. Monitor platelet counts of any signs of bleeding 3.  Pain Management/chronic back pain: Oxycodone as needed 4. Mood: Xanax 0.5 mg 3 times a day as needed 5. Neuropsych: This patient iscapable of making decisions on hisown behalf. 6. Skin/Wound Care: Routine skin checks 7. Fluids/Electrolytes/Nutrition: Routine I&O's with follow-up chemistries 8.Hyperlipidemia. Lipitor 9.Tobacco abuse counseling 10.Recent left ureteral stone. Has hematuria potentiated by anticoagulants, last Hgb is 14.8 on 9/79/14 13.7  Continue Flomax. Follow-up urology services, has stent placement, scheduled for removal which will be delayed by CVA, uro f/u as OP 11.Hypertension. Norvasc 5 mg daily. good control 9/27   Vitals:   05/03/17 1454 05/04/17 0429  BP: 118/75 104/68  Pulse: 89 80  Resp: 18 20  Temp: 98.8 F (37.1 C) 98 F (36.7 C)  SpO2: 94% 99%  12.  Mild leukocytosis, afebrile 13.  Tachycardia- no anemia, volume status looks normal, EKG show sinus rhythm- will monitor   -increased metoprolol to  bid with good results, bp is ok 9/27 14.  Insomnia - remeron, His complaints are inconsistent. May need some relaxation strategies 15. loose inc stools, increased freq since rocephin, no indication for  Checking C diff toxin currently 16.  Bladder incont with freq at noc no dysuria, likely spastic bladder  17.  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  UTI with bacteremia: ecoli in urine and blood, enterobacter in blood   -afebrile.  -continue ceftriaxone though 9/28     LOS (Days) 21 A FACE TO FACE EVALUATION WAS PERFORMED  Lizet Kelso E 05/04/2017, 7:27 AM

## 2017-05-04 NOTE — Progress Notes (Signed)
Speech Language Pathology Daily Session Note  Patient Details  Name: Brendan Charles MRN: [id number] Date of Birth: Dec 15, 1955  Today's Date: 05/04/2017 SLP Individual Time: 0830-0925 SLP Individual Time Calculation (min): 55 min  Short Term Goals: Week 3: SLP Short Term Goal 1 (Week 3): Patient will perform pharyngeal strengthening exercises with supervision verbal cues for accuracy.  SLP Short Term Goal 2 (Week 3): Patient will perform 25 repetitions of EMST/IMST excerises with Min A verbal cues for accuracy and a self-percieved effort level of 8/10.  SLP Short Term Goal 3 (Week 3): Patient will utilize speech intelligibility strategies to maximize speech intelligibility at the phrase level to 75% with supervision verbal cues. SLP Short Term Goal 4 (Week 3): Patient will consume trials of thin liquids via cup without overt s/s of aspiration with supervision verbal cues for use of swallowing strategies to assess readiness for repeat MBS.  SLP Short Term Goal 5 (Week 3): Patient will demonstrate efficent mastication of Dys. 2 textures without overt s/s of aspiration with Min A verbal cues over 2 sessions prior to upgrade.   Skilled Therapeutic Interventions: Skilled treatment session focused on dysphagia and speech goals. SLP facilitated session by providing Supervision verbal cues for accuracy while performing 25 repetitions of EMST/IMST at a self-perceived effort level of 8/10. Patient consumed trials of thin liquids via cup  and demonstrated without overt s/s of aspiration with intermittent supervision verbal cues needed for use of multiple swallows. Patient was required supervision verbal cues for use of speech intelligibility strategies at the phrase and sentence level. Patient left upright in bed with all needs within reach. Continue with current plan of care.          Function:  Eating Eating   Modified Consistency Diet: Yes Eating Assist Level: More than reasonable amount of  time;Set up assist for           Cognition Comprehension Comprehension assist level: Follows basic conversation/direction with no assist  Expression   Expression assist level: Expresses basic 75 - 89% of the time/requires cueing 10 - 24% of the time. Needs helper to occlude trach/needs to repeat words.  Social Interaction Social Interaction assist level: Interacts appropriately with others with medication or extra time (anti-anxiety, antidepressant).  Problem Solving Problem solving assist level: Solves basic 90% of the time/requires cueing < 10% of the time  Memory Memory assist level: More than reasonable amount of time    Pain No/Denies Pain   Therapy/Group: Individual Therapy  [name] 05/04/2017, 3:01 PM

## 2017-05-04 NOTE — Plan of Care (Signed)
Problem: RH BOWEL ELIMINATION Goal: RH STG MANAGE BOWEL WITH ASSISTANCE STG Manage Bowel with Mod I Assistance.   Outcome: Not Progressing Total assist- incontinent of b&B  Problem: RH BLADDER ELIMINATION Goal: RH STG MANAGE BLADDER WITH ASSISTANCE STG Manage Bladder With Min Assistance   Outcome: Not Progressing Total assist

## 2017-05-04 NOTE — Progress Notes (Signed)
Occupational Therapy Session Note  Patient Details  Name: Brendan Charles MRN: [id number] Date of Birth: 1956/03/18  Today's Date: 05/04/2017 OT Individual Time: 6812-7517 OT Individual Time Calculation (min): 57 min   Short Term Goals: Week 3:  OT Short Term Goal 1 (Week 3): Pt will be able to stand at sink with 75% S to perform grooming tasks. OT Short Term Goal 2 (Week 3): Pt will complete 2/3 toileting tasks with S. OT Short Term Goal 3 (Week 3): Pt wiill maintain sitting balance during daily task with supervision and verbal cues  Skilled Therapeutic Interventions/Progress Updates:    Treatment session focused on increased independence with bathing/dressing tasks, standing balance/endurance, and postural control. Pt transferred bed>wc with close supervision and set-up of wc. Pt propelled wc into bathroom with min A to get over threshold, and min A to position beside tub bench. Pt completed stand-pivot into shower with use of grab bars and min cues to push from wc. Bathing completed today with overall supervision and min verbal cues to integrate L UE when washing hair and body. Pt with improved postural control and LB strength as he was able to don pants with more ease today and was also able to maintain standing balance while pulling up pants with stand by assist. Addressed standing balance/endurance with pt completing 3 grooming tasks in standing. Pt utilized Pharmacist, hospital to self-correct lateral lean to R and needed stand by assist with intermittent min guard. Pt tolerated 7 mins total standing. Pt donned socks with sock-aid and AFO with set -up A. Pt left seated in wc with needs met and call bell in hand.     Therapy Documentation Precautions:  Precautions Precautions: Fall Precaution Comments: new L hemiparesis; recent CVA with R sided weakness Restrictions Weight Bearing Restrictions: No  See Function Navigator for Current Functional Status.   Therapy/Group: Individual  Therapy  [name] 05/04/2017, 1:18 PM

## 2017-05-05 ENCOUNTER — Inpatient Hospital Stay (HOSPITAL_COMMUNITY): Payer: Medicare Other | Admitting: Speech Pathology

## 2017-05-05 ENCOUNTER — Inpatient Hospital Stay (HOSPITAL_COMMUNITY): Payer: Medicare Other | Admitting: Physical Therapy

## 2017-05-05 ENCOUNTER — Inpatient Hospital Stay (HOSPITAL_COMMUNITY): Payer: Medicare Other | Admitting: Occupational Therapy

## 2017-05-05 NOTE — Progress Notes (Signed)
Occupational Therapy Session Note  Patient Details  Name: Brendan Charles MRN: [id number] Date of Birth: October 14, 1955  Today's Date: 05/05/2017 OT Individual Time: 0930-1030 OT Individual Time Calculation (min): 60 min   Short Term Goals: Week 3:  OT Short Term Goal 1 (Week 3): Pt will be able to stand at sink with 75% S to perform grooming tasks. OT Short Term Goal 2 (Week 3): Pt will complete 2/3 toileting tasks with S. OT Short Term Goal 3 (Week 3): Pt wiill maintain sitting balance during daily task with supervision and verbal cues  Skilled Therapeutic Interventions/Progress Updates:   Pt greeted in bed and requested to shower. Pt transferred to wc on L with min guard A today and set-up of wc. Pt gathered clothing wc level with VC for positioning and body awareness when reaching forward. Pt propelled wc to bathroom with improved  B UE coordination to maneuver wc toward tub bench. Discussed home bathroom and pt reports his son will install a grab bar. Practiced scooting transfer to tub bench in case his son does not install grab bar. Bathing completed with overall set-up A and supervision. Pt able to open soap containers and use of long-handled sponge to safely wash lower body and back. Pt also with improved functional use of L UE,  Integrating it into bathing tasks on 75% of opportunities. Dressing completed in bathroom at pt request 2/2 being cold. Grab bar at bathroom wall used for steadying with min A to maintain anterior weight shift  And vc not to use head for steadying without UE support. .Pt then shaved at the sink with set-up A. Pt left seated in wc with needs met.   Therapy Documentation Precautions:  Precautions Precautions: Fall Precaution Comments: new L hemiparesis; recent CVA with R sided weakness Restrictions Weight Bearing Restrictions: No  See Function Navigator for Current Functional Status.   Therapy/Group: Individual Therapy  [name] 05/05/2017, 9:59  AM

## 2017-05-05 NOTE — Progress Notes (Signed)
Social Work Patient ID: Brendan Charles, male   DOB: 12/08/1955, 61 y.o.   MRN: [id number]   Spoke with Brendan Charles via telephone to discuss family education and discharge needs. He plans to be here Monday afternoon He has set up with PT-Caitlyn. Will discuss with therapy team pt';s equipment needs and was receiving home health via [facility] will resume these services. Work on discharge needs.

## 2017-05-05 NOTE — Progress Notes (Signed)
Subjective/Complaints:  C/o urinary freq and incont, no bowel incont  ROS: pt denies nausea, vomiting, diarrhea, cough, shortness of breath or chest pain    Objective: Vital Signs: Blood pressure 102/71, pulse 78, temperature 98.1 F (36.7 C), temperature source Oral, resp. rate 18, height  (1.803 m), weight 71.5 kg (157 lb 11.1 oz), SpO2 98 %. No results found. No results found for this or any previous visit (from the past 72 hour(s)).   HEENT: normal and dysarthric Cardio: RRR without murmur. No JVD   Resp: CTA Bilaterally without wheezes or rales. Normal effort   GI: BS positive and NT, ND Extremity:  Pulses positive and No Edema Skin:   Intact Neuro: Alert/Oriented, Cranial Nerve Abnormalities Left central VII, Normal Sensory, Abnormal Motor 3- Left delt, Bi, tri, finger flexor/ext, hip/knee extensor synergy, 2-/5 at ankle, 4/5 in RUE and RLE   and remains dysarthric. Less distracted today Musc/Skel:  Other no pain with UE or LE ROM Gen NAD   Assessment/Plan: 1. Functional deficits secondary to acute left hemiparesis, subacute right hemiparesis which require 3+ hours per day of interdisciplinary therapy in a comprehensive inpatient rehab setting. Physiatrist is providing close team supervision and 24 hour management of active medical problems listed below. Physiatrist and rehab team continue to assess barriers to discharge/monitor patient progress toward functional and medical goals. FIM: Function - Bathing Position: Shower Body parts bathed by patient: Right arm, Left arm, Chest, Abdomen, Front perineal area, Buttocks, Right upper leg, Left upper leg, Right lower leg, Left lower leg Body parts bathed by helper: Back Assist Level: Supervision or verbal cues Assistive Device Comment: long handled sponge  Function- Upper Body Dressing/Undressing What is the patient wearing?: Pull over shirt/dress Pull over shirt/dress - Perfomed by patient: Thread/unthread left sleeve,  Put head through opening, Thread/unthread right sleeve, Pull shirt over trunk Pull over shirt/dress - Perfomed by helper: Put head through opening, Pull shirt over trunk Assist Level: Set up Set up : To obtain clothing/put away Function - Lower Body Dressing/Undressing What is the patient wearing?: Pants, Socks, Shoes, AFO Position: Wheelchair/chair at sink Pants- Performed by patient: Thread/unthread right pants leg, Thread/unthread left pants leg, Pull pants up/down Pants- Performed by helper: Pull pants up/down Non-skid slipper socks- Performed by patient: Don/doff right sock, Don/doff left sock Non-skid slipper socks- Performed by helper: Don/doff left sock Socks - Performed by patient: Don/doff right sock, Don/doff left sock Shoes - Performed by patient: Fasten right, Don/doff right shoe, Don/doff left shoe, Fasten left Shoes - Performed by helper: Don/doff right shoe, Don/doff left shoe, Fasten right, Fasten left AFO - Performed by patient: Don/doff right AFO, Don/doff left AFO AFO - Performed by helper: Don/doff left AFO, Don/doff right AFO Assist for footwear: Supervision/touching assist Assist for lower body dressing: Supervision or verbal cues Assistive Device Comment: sock aid  Function - Toileting Toileting activity did not occur: N/A Toileting steps completed by patient: Performs perineal hygiene Toileting steps completed by helper: Adjust clothing after toileting, Performs perineal hygiene Toileting Assistive Devices: Grab bar or rail Assist level: Touching or steadying assistance (Pt.75%)  Function - Archivist transfer assistive device: Grab bar Assist level to toilet: Touching or steadying assistance (Pt > 75%) Assist level from toilet: Touching or steadying assistance (Pt > 75%)  Function - Chair/bed transfer Chair/bed transfer method: Squat pivot Chair/bed transfer assist level: Supervision or verbal cues Chair/bed transfer assistive device:  Armrests Chair/bed transfer details: Verbal cues for sequencing  Function - Locomotion: Wheelchair  Will patient use wheelchair at discharge?: Yes Type: Manual Max wheelchair distance: 100 Assist Level: Supervision or verbal cues Assist Level: Supervision or verbal cues Wheel 150 feet activity did not occur: Safety/medical concerns Turns around,maneuvers to table,bed, and toilet,negotiates 3% grade,maneuvers on rugs and over doorsills: No Function - Locomotion: Ambulation Assistive device: Walker-rolling, Orthosis Max distance: 44' Assist level: Touching or steadying assistance (Pt > 75%) Walk 10 feet activity did not occur: Safety/medical concerns Assist level: Touching or steadying assistance (Pt > 75%) Walk 50 feet with 2 turns activity did not occur: Safety/medical concerns Assist level: Touching or steadying assistance (Pt > 75%) Walk 150 feet activity did not occur: Safety/medical concerns Walk 10 feet on uneven surfaces activity did not occur: Safety/medical concerns  Function - Comprehension Comprehension: Auditory Comprehension assist level: Follows basic conversation/direction with no assist  Function - Expression Expression: Verbal Expression assist level: Expresses basic 75 - 89% of the time/requires cueing 10 - 24% of the time. Needs helper to occlude trach/needs to repeat words.  Function - Social Interaction Social Interaction assist level: Interacts appropriately with others with medication or extra time (anti-anxiety, antidepressant).  Function - Problem Solving Problem solving assist level: Solves basic 90% of the time/requires cueing < 10% of the time  Function - Memory Memory assist level: More than reasonable amount of time Patient normally able to recall (first 3 days only): That he or she is in a hospital  Medical Problem List and Plan: 1. Right hemiparesis as well as left side weakness/dysarthriasecondary to acute lacunar infarction involving the  right medulla oblongata and medial aspect. Subacute large vessel left MCA infarct due to intracranial stenosis-    - CIR PT OT SLP     .2. DVT Prophylaxis/Anticoagulation: Subcutaneous Lovenox. Monitor platelet counts of any signs of bleeding 3. Pain Management/chronic back pain: Oxycodone as needed 4. Mood: Xanax 0.5 mg 3 times a day as needed 5. Neuropsych: This patient iscapable of making decisions on hisown behalf. 6. Skin/Wound Care: Routine skin checks 7. Fluids/Electrolytes/Nutrition: Routine I&O's with follow-up chemistries 8.Hyperlipidemia. Lipitor 9.Tobacco abuse counseling 10.Recent left ureteral stone. Has hematuria potentiated by anticoagulants, last Hgb is 14.8 on 9/79/14 13.7  Continue Flomax. Follow-up urology services, has stent placement, scheduled for removal which will be delayed by CVA, uro f/u as OP 11.Hypertension. Norvasc 5 mg daily. good control 9/28   Vitals:   05/04/17 1509 05/05/17 0432  BP: 105/60 102/71  Pulse: 84 78  Resp: 18 18  Temp: 97.7 F (36.5 C) 98.1 F (36.7 C)  SpO2: 99% 98%  12.  Mild leukocytosis, afebrile 13.  Tachycardia- no anemia, volume status looks normal, EKG show sinus rhythm- will monitor   -increased metoprolol to  bid with good results, bp is ok 9/27 14.  Insomnia - remeron, His complaints are inconsistent. May need some relaxation strategies 15. loose inc stools, increased freq since rocephin, no indication for  Checking C diff toxin currently 16.  Bladder incont with freq at noc no dysuria, likely spastic bladder  17.  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  UTI with bacteremia: ecoli in urine and blood, enterobacter in blood   -afebrile.  -continue ceftriaxone though 9/28 19.  Spastic bladder needs timed toileting , urinal at bedside    LOS (Days) 22 A FACE TO FACE EVALUATION WAS PERFORMED  KIRSTEINS,ANDREW E 05/05/2017, 7:27 AM

## 2017-05-05 NOTE — Progress Notes (Signed)
Physical Therapy Note  Patient Details  Name: Brendan Charles MRN: [id number] Date of Birth: 1956/08/01 Today's Date: 05/05/2017  Time: 1030-1125 55 minutes  1:1 no c/o pain.  Pt participated in vestibular assessment and treatment (see details below).  Pt treated with brandt-Daroff exercises for Lt posterior canal x 2 reps with pt reporting slight improvement in symptoms. Pt unable to tolerate Epley maneuver.  Pt given handout for VOR x 1 exercises for treatment of symptoms with head turns horizontal and vertical.  Pt able to perform transfers throughout session with min A.  Mod A for sit to sidelying both directions.  Orthotist came at end of session to return shoes with toe caps and fit AFOs.  Pt left in chair with quick release belt donned, needs at hand.      Vestibular Assessment - 05/05/17 1133      Symptom Behavior   Duration of Dizziness seconds-minutes   Aggravating Factors Supine to sit;Sit to stand     Occulomotor Exam   Head shaking Horizontal L beating nystagmus   Smooth Pursuits Intact   Saccades Intact     Vestibulo-Occular Reflex   VOR 1 Head Only (x 1 viewing) dizzy with horizontal and vertical     Dix-Hallpike Left   Dix-Hallpike Left Duration 2 beats   Dix-Hallpike Left Symptoms Downbeat Nystagmus     Orthostatics   BP sitting 108/78   BP standing (after 1 minute) 116/80       Brendan Charles 05/05/2017, 11:36 AM

## 2017-05-05 NOTE — Progress Notes (Signed)
Speech Language Pathology Weekly Progress and Session Note  Patient Details  Name: Brendan Charles MRN: [id number] Date of Birth: Sep 11, 1955  Beginning of progress report period: April 28, 2017 End of progress report period: May 05, 2017  Today's Date: 05/05/2017 SLP Individual Time: 0825-0920 SLP Individual Time Calculation (min): 55 min  Short Term Goals: Week 3: SLP Short Term Goal 1 (Week 3): Patient will perform pharyngeal strengthening exercises with supervision verbal cues for accuracy.  SLP Short Term Goal 1 - Progress (Week 3): Not met SLP Short Term Goal 2 (Week 3): Patient will perform 25 repetitions of EMST/IMST excerises with Min A verbal cues for accuracy and a self-percieved effort level of 8/10.  SLP Short Term Goal 2 - Progress (Week 3): Met SLP Short Term Goal 3 (Week 3): Patient will utilize speech intelligibility strategies to maximize speech intelligibility at the phrase level to 75% with supervision verbal cues. SLP Short Term Goal 3 - Progress (Week 3): Met SLP Short Term Goal 4 (Week 3): Patient will consume trials of thin liquids via cup without overt s/s of aspiration with supervision verbal cues for use of swallowing strategies to assess readiness for repeat MBS.  SLP Short Term Goal 4 - Progress (Week 3): Met SLP Short Term Goal 5 (Week 3): Patient will demonstrate efficent mastication of Dys. 2 textures without overt s/s of aspiration with Min A verbal cues over 2 sessions prior to upgrade.  SLP Short Term Goal 5 - Progress (Week 3): Not met    New Short Term Goals: Week 4: SLP Short Term Goal 1 (Week 4): Patient will perform pharyngeal strengthening exercises with supervision verbal cues for accuracy.  SLP Short Term Goal 2 (Week 4): Patient will perform 25 repetitions of EMST/IMST excerises with Min A verbal cues for accuracy and a self-percieved effort level of 8/10.  SLP Short Term Goal 3 (Week 4): Patient will utilize speech intelligibility  strategies to maximize speech intelligibility at the phrase level to 90% with supervision verbal cues. SLP Short Term Goal 4 (Week 4): Patient will demonstrate efficent mastication of Dys. 2 textures without overt s/s of aspiration with Min A verbal cues over 2 sessions prior to upgrade.   Weekly Progress Updates: Patient has made functional gains and has met 3 of 4 STG's this reporting period. Currently, patient is consuming Dys. 1 textures with nectar-thick liquids without overt s/s of aspiration with intermittent supervision. Patient has been consuming trials of Dys. 2 textures and thin liquids without overt s/s of aspiration. However, recommend repeat MBS on 10/1 due to h/o silent aspiration. Patient demonstrates increased ability to perform IMST/EMST exercises with decreased effort level and requires overall supervision-Min A verbal cues for use of speech intelligibility strategies at the phrase and sentence level to achieve ~75% intelligibility. Patient and family education is ongoing. Patient would benefit from continued skilled SLP intervention to maximize his swallowing function and functional communication prior to discharge.       Intensity: Minumum of 1-2 x/day, 30 to 90 minutes Frequency: 3 to 5 out of 7 days Duration/Length of Stay: 05/12/17 Treatment/Interventions: Financial trader;Dysphagia/aspiration precaution training;Environmental controls;Functional tasks;Patient/family education;Therapeutic Activities;Speech/Language facilitation;Internal/external aids   Daily Session  Skilled Therapeutic Interventions: Skilled treatment session focused on speech and dysphagia goals. SLP facilitated session by re-administering RMT. Patient demonstrated meaningful weakness in both his peak MIP and MEP. Patient's peak MIP was 49 cm H2O with the lower limit of normal at 52 cm H2O. Patient's peak MEP was 38 cm H2O  with the lower limit of normal at 62 cm H2O. Patient performed EMST exercises at 16 cm  H2O with a self-perceived effort level of 10/10 and IMST at 25 cm H2O with a self-perceived effort level of 10/10.  Patient also consumed trials of thin liquids via cup after oral care without overt s/s of aspiration. Recommend MBS tomorrow to assess readiness for possible upgrade. Patient left upright in wheelchair with all needs within reach. Continue with current plan of care.       Function:     Cognition Comprehension Comprehension assist level: Follows basic conversation/direction with no assist  Expression   Expression assist level: Expresses basic 75 - 89% of the time/requires cueing 10 - 24% of the time. Needs helper to occlude trach/needs to repeat words.  Social Interaction Social Interaction assist level: Interacts appropriately with others with medication or extra time (anti-anxiety, antidepressant).  Problem Solving Problem solving assist level: Solves basic 90% of the time/requires cueing < 10% of the time  Memory Memory assist level: More than reasonable amount of time   Pain No/Denies Pain   Therapy/Group: Individual Therapy  Brendan Charles 05/05/2017, 11:32 AM

## 2017-05-05 NOTE — Progress Notes (Signed)
Physical Therapy Session Note  Patient Details  Name: Brendan Charles MRN: [id number] Date of Birth: 10-Jul-1956  Today's Date: 05/05/2017 PT Individual Time: 1300-1335 PT Individual Time Calculation (min): 35 min   Short Term Goals: Week 3:  PT Short Term Goal 1 (Week 3): Pt will ambulate 30' with RW and consistent min assist.  PT Short Term Goal 2 (Week 3): Pt will negotiate 5 steps with 1 rail and min assist PT Short Term Goal 3 (Week 3): Pt will attempt curb step negotiation with RW with PT.   Skilled Therapeutic Interventions/Progress Updates:    no c/o pain throughout session; session focused on gait and functional mobility.   Pt performed transfers throughout session with min A for guarding and keeping weight over toes in transfers and sit > stand. Pt ambulated 34' x 2 with min A for guarding and steadying and RW. Pt required rest break between ambulations. PT discussed family education with son for next week to teach sons transfer techniques and gait education. Pt left supine in bed with call bell within reach and all needs addressed.   Therapy Documentation Precautions:  Precautions Precautions: Fall Precaution Comments: new L hemiparesis; recent CVA with R sided weakness Restrictions Weight Bearing Restrictions: No   See Function Navigator for Current Functional Status.   Therapy/Group: Individual Therapy  Dossie Arbour 05/05/2017, 4:58 PM

## 2017-05-06 ENCOUNTER — Inpatient Hospital Stay (HOSPITAL_COMMUNITY): Payer: Medicare Other

## 2017-05-06 NOTE — Progress Notes (Signed)
Speech Language Pathology Daily Session Note  Patient Details  Name: Brendan Charles MRN: [id number] Date of Birth: [birth year]  Today's Date: 05/06/2017 SLP Individual Time: 4098-1191 SLP Individual Time Calculation (min): 26 min  Short Term Goals: Week 4: SLP Short Term Goal 1 (Week 4): Patient will perform pharyngeal strengthening exercises with supervision verbal cues for accuracy.  SLP Short Term Goal 2 (Week 4): Patient will perform 25 repetitions of EMST/IMST excerises with Min A verbal cues for accuracy and a self-percieved effort level of 8/10.  SLP Short Term Goal 3 (Week 4): Patient will utilize speech intelligibility strategies to maximize speech intelligibility at the phrase level to 90% with supervision verbal cues. SLP Short Term Goal 4 (Week 4): Patient will demonstrate efficent mastication of Dys. 2 textures without overt s/s of aspiration with Min A verbal cues over 2 sessions prior to upgrade.   Skilled Therapeutic Interventions: Skilled ST services focused on speech skills. Pt returned demonstration of RMT devices with IMST at level 25 with 25 repetations rating effort level at 9 and EMT  Starting at level 16 for 7 repetations and then reducing to level 15 with a total of 25 repetations rating effort level at 10. SLP provided Mod A verbal cues during RMT exercises and max-mod A verbal cues to slow rate to increase speech intelligibility during conversation. Pt was left in bed with call bell within reach,. Recommend to continue [place] services.      Function:  Eating Eating   Modified Consistency Diet: Yes Eating Assist Level: Set up assist for;Supervision or verbal cues (Intermittent)   Eating Set Up Assist For: Opening containers       Cognition Comprehension Comprehension assist level: Follows basic conversation/direction with no assist  Expression   Expression assist level: Expresses basic 50 - 74% of the time/requires cueing 25 - 49% of the time. Needs to repeat  parts of sentences.  Social Interaction Social Interaction assist level: Interacts appropriately with others with medication or extra time (anti-anxiety, antidepressant).  Problem Solving Problem solving assist level: Solves basic 90% of the time/requires cueing < 10% of the time  Memory Memory assist level: More than reasonable amount of time    Pain Pain Assessment Pain Assessment: No/denies pain  Therapy/Group: Individual Therapy  [name]  [facility] 05/06/2017, 6:09 PM

## 2017-05-06 NOTE — Progress Notes (Signed)
Subjective/Complaints:  Complains of dizziness. Has worked with therapy on vestibular exercises. Otherwise feeling well  ROS: pt denies nausea, vomiting, diarrhea, cough, shortness of breath or chest pain   Objective: Vital Signs: Blood pressure 107/63, pulse 79, temperature 97.9 F (36.6 C), temperature source Oral, resp. rate 18, height  (1.803 m), weight 71.5 kg (157 lb 11.1 oz), SpO2 100 %. No results found. No results found for this or any previous visit (from the past 72 hour(s)).   HEENT: normal and dysarthric Cardio: RRR without murmur. No JVD    Resp: CTA Bilaterally without wheezes or rales. Normal effort    GI: BS positive and NT, ND Extremity:  Pulses positive and No Edema Skin:   Intact Neuro: Alert/Oriented, Cranial Nerve Abnormalities Left central VII, Normal Sensory, Abnormal Motor 3- Left delt, Bi, tri, finger flexor/ext, hip/knee extensor synergy, 2-/5 at ankle, 4/5 in RUE and RLE ---motor exam unchanged. No nystagmus seen Musc/Skel:  Other no pain with UE or LE ROM Gen NAD   Assessment/Plan: 1. Functional deficits secondary to acute left hemiparesis, subacute right hemiparesis which require 3+ hours per day of interdisciplinary therapy in a comprehensive inpatient rehab setting. Physiatrist is providing close team supervision and 24 hour management of active medical problems listed below. Physiatrist and rehab team continue to assess barriers to discharge/monitor patient progress toward functional and medical goals. FIM: Function - Bathing Position: Shower Body parts bathed by patient: Right arm, Left arm, Chest, Abdomen, Front perineal area, Buttocks, Right upper leg, Left upper leg, Right lower leg, Left lower leg Body parts bathed by helper: Back Assist Level: Supervision or verbal cues Assistive Device Comment: long handled sponge  Function- Upper Body Dressing/Undressing What is the patient wearing?: Pull over shirt/dress Pull over shirt/dress -  Perfomed by patient: Thread/unthread left sleeve, Put head through opening, Thread/unthread right sleeve, Pull shirt over trunk Pull over shirt/dress - Perfomed by helper: Put head through opening, Pull shirt over trunk Assist Level: More than reasonable time Set up : To obtain clothing/put away Function - Lower Body Dressing/Undressing What is the patient wearing?: Pants, Non-skid slipper socks Position: Other (comment) (wc in bathroom) Pants- Performed by patient: Thread/unthread right pants leg, Thread/unthread left pants leg Pants- Performed by helper: Pull pants up/down Non-skid slipper socks- Performed by patient: Don/doff left sock, Don/doff right sock Non-skid slipper socks- Performed by helper: Don/doff left sock Socks - Performed by patient: Don/doff right sock, Don/doff left sock Shoes - Performed by patient: Fasten right, Don/doff right shoe, Don/doff left shoe, Fasten left Shoes - Performed by helper: Don/doff right shoe, Don/doff left shoe, Fasten right, Fasten left AFO - Performed by patient: Don/doff right AFO, Don/doff left AFO AFO - Performed by helper: Don/doff left AFO, Don/doff right AFO Assist for footwear: Supervision/touching assist Assist for lower body dressing: Touching or steadying assistance (Pt > 75%) Assistive Device Comment: sock-aid  Function - Toileting Toileting activity did not occur: N/A Toileting steps completed by patient: Performs perineal hygiene Toileting steps completed by helper: Adjust clothing after toileting, Performs perineal hygiene Toileting Assistive Devices: Grab bar or rail Assist level: Touching or steadying assistance (Pt.75%)  Function - Archivist transfer assistive device: Grab bar Assist level to toilet: Touching or steadying assistance (Pt > 75%) Assist level from toilet: Touching or steadying assistance (Pt > 75%)  Function - Chair/bed transfer Chair/bed transfer method: Squat pivot Chair/bed transfer assist  level: Touching or steadying assistance (Pt > 75%) Chair/bed transfer assistive device: Armrests, Bedrails Chair/bed  transfer details: Verbal cues for sequencing  Function - Locomotion: Wheelchair Will patient use wheelchair at discharge?: Yes Type: Manual Max wheelchair distance: 100 Assist Level: Supervision or verbal cues Assist Level: Supervision or verbal cues Wheel 150 feet activity did not occur: Safety/medical concerns Turns around,maneuvers to table,bed, and toilet,negotiates 3% grade,maneuvers on rugs and over doorsills: No Function - Locomotion: Ambulation Assistive device: Walker-rolling, Orthosis Max distance: 79' Assist level: Touching or steadying assistance (Pt > 75%) Walk 10 feet activity did not occur: Safety/medical concerns Assist level: Touching or steadying assistance (Pt > 75%) Walk 50 feet with 2 turns activity did not occur: Safety/medical concerns Assist level: Touching or steadying assistance (Pt > 75%) Walk 150 feet activity did not occur: Safety/medical concerns Walk 10 feet on uneven surfaces activity did not occur: Safety/medical concerns  Function - Comprehension Comprehension: Auditory Comprehension assist level: Follows basic conversation/direction with no assist  Function - Expression Expression: Verbal Expression assist level: Expresses basic 75 - 89% of the time/requires cueing 10 - 24% of the time. Needs helper to occlude trach/needs to repeat words.  Function - Social Interaction Social Interaction assist level: Interacts appropriately with others with medication or extra time (anti-anxiety, antidepressant).  Function - Problem Solving Problem solving assist level: Solves basic 90% of the time/requires cueing < 10% of the time  Function - Memory Memory assist level: More than reasonable amount of time Patient normally able to recall (first 3 days only): That he or she is in a hospital  Medical Problem List and Plan: 1. Right  hemiparesis as well as left side weakness/dysarthriasecondary to acute lacunar infarction involving the right medulla oblongata and medial aspect. Subacute large vessel left MCA infarct due to intracranial stenosis-    - CIR PT OT SLP        -therapy has performed vestibular assessment ant treatment. Encouraged work on exercises provided to him 2. DVT Prophylaxis/Anticoagulation: Subcutaneous Lovenox. Monitor platelet counts of any signs of bleeding 3. Pain Management/chronic back pain: Oxycodone as needed 4. Mood: Xanax 0.5 mg 3 times a day as needed 5. Neuropsych: This patient iscapable of making decisions on hisown behalf. 6. Skin/Wound Care: Routine skin checks 7. Fluids/Electrolytes/Nutrition: Routine I&O's with follow-up chemistries 8.Hyperlipidemia. Lipitor 9.Tobacco abuse counseling 10.Recent left ureteral stone. Has hematuria potentiated by anticoagulants, last Hgb is 14.8 on 9/79/14 13.7  Continue Flomax. Follow-up urology services, has stent placement, scheduled for removal which will be delayed by CVA, uro f/u as OP 11.Hypertension. Norvasc 5 mg daily. good control 9/28   Vitals:   05/05/17 1441 05/06/17 0425  BP: 111/71 107/63  Pulse: 89 79  Resp: 20 18  Temp: 98 F (36.7 C) 97.9 F (36.6 C)  SpO2: 97% 100%  12.  Mild leukocytosis, afebrile 13.  Tachycardia- no anemia, volume status looks normal, EKG show sinus rhythm- will monitor   -increased metoprolol to  bid with good results, bp controlled 9/29 14.  Insomnia - remeron, His complaints are inconsistent. May need some relaxation strategies 15.stools more formed, abx completed 16.  Bladder incont with freq at noc no dysuria, likely spastic bladder  17.  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  UTI with bacteremia: ecoli in urine and blood, enterobacter in blood   -afebrile.  -continue ceftriaxone completed 9./28 19.  Spastic bladder needs timed toileting , urinal at bedside    LOS (Days) 23 A  FACE TO FACE EVALUATION WAS PERFORMED  SWARTZ,ZACHARY T 05/06/2017, 8:33 AM

## 2017-05-06 NOTE — Progress Notes (Signed)
Occupational Therapy Session Note  Patient Details  Name: Brendan Charles MRN: [id number] Date of Birth: 1956/02/25  Today's Date: 05/06/2017 OT Individual Time: 1445-1545 OT Individual Time Calculation (min): 60 min   Skilled Therapeutic Interventions/Progress Updates:    1:1. Pt dons pants at bed level rolling B with Vc for technique to advance pants past hips despite encouragement to don at sit to stand level. Pt threads B socks using sock aide sitting at EOB with Vc for technique. Pt supine to sitting with VC for sequencing and CGA. Pt squat pivot transfer with supervision EOB>w/c with supervision and VC for locking brakes prior to transfer. In tx gym, Pt completes vestibular HEP with fixed gaze and head rotation and cervical flexion/extension. Pt with one beat of nystagmus with head turning L. Pt reporting dizziness, but subsides. OT propels pt to outside courtyard and pt perfered music of "lynard skynard" playing in background. Pt completes palm<>finger translocation with LUE with checker without dropping 80% of trials. Pt completes same activity with scrabble tiles dropping tiles 70% of time. Pt able to create words with scrabble tiles with cues/hints. Pt ambulates with RW and AFOs over uneven surfaces with VC for looking forward and B foot clearance. Exited session with pt seated in recliner with call light in reach and all needs met.   Therapy Documentation Precautions:  Precautions Precautions: Fall Precaution Comments: new L hemiparesis; recent CVA with R sided weakness Restrictions Weight Bearing Restrictions: No  See Function Navigator for Current Functional Status.   Therapy/Group: Individual Therapy  Tonny Branch 05/06/2017, 5:04 PM

## 2017-05-07 ENCOUNTER — Inpatient Hospital Stay (HOSPITAL_COMMUNITY): Payer: Medicare Other | Admitting: Occupational Therapy

## 2017-05-07 NOTE — Progress Notes (Signed)
Occupational Therapy Session Note  Patient Details  Name: Brendan Charles MRN: [id number] Date of Birth: 06-May-1956  Today's Date: 05/07/2017 OT Individual Time: 4098-1191 Individual Treatment Time Calculation: 62 min  Skilled Therapeutic Interventions/Progress Updates:    Tx focus on functional transfers, NMR, endurance, and UB strengthening during self care tasks.   Pt greeted supine in bed, agitated that OT arrived at time he was not expecting. Went over schedule with pt c/o extensively over arrangement of dates/times. With encouragement, he was agreeable to participate. He transitioned to EOB and completed squat pivot transfer with min vcs for safe setup of w/c. Once in w/c, pt c/o dizziness. BP: 187/166. After 1 minute, BP retaken 108/67, and 1 minute after 109/69 with pt reporting dizziness improved, though still present. RN notified. He self propelled to closet to retrieve new shorts. Pt completing 3/3 components of task with extra time and cues for w/c mgt during sit<stands (BP after stand 108/75). Pt donning bilateral AFOs prior to standing with setup. He required multiple rest breaks throughout due to fatigue. Pt self propelled w/c down hallway for UE NMR, gathered new linen for room and then self propelled back. He completed grooming tasks w/c level at sink, able to meet bilateral UE and FM demands of tasks without physical assist. Pt then completed squat pivot transfer to recliner with supervision. He was repositioned for comfort and left with RN at time of departure.   He declined bathing today due to pt opting to complete bathing with son later in the day.   Therapy Documentation Precautions:  Precautions Precautions: Fall Precaution Comments: new L hemiparesis; recent CVA with R sided weakness Restrictions Weight Bearing Restrictions: No Vital Signs: Therapy Vitals BP: 112/72 Patient Position (if appropriate): Sitting Pain: No c/o pain during tx    ADL: ADL ADL  Comments: Please see functional navigator     See Function Navigator for Current Functional Status.   Therapy/Group: Individual Therapy  Omaree Fuqua A Kalayah Leske 05/07/2017, 12:23 PM

## 2017-05-07 NOTE — Progress Notes (Signed)
Subjective/Complaints: Had a good night. Eating breakfast. Tells me he's doing his vestibular exercises  ROS: pt denies nausea, vomiting, diarrhea, cough, shortness of breath or chest pain   Objective: Vital Signs: Blood pressure 114/65, pulse 73, temperature (!) 97.5 F (36.4 C), temperature source Oral, resp. rate 18, height  (1.803 m), weight 71.5 kg (157 lb 11.1 oz), SpO2 99 %. No results found. No results found for this or any previous visit (from the past 72 hour(s)).   HEENT: normal and dysarthric Cardio: RRR without murmur. No JVD     Resp: CTA Bilaterally without wheezes or rales. Normal effort     GI: BS positive and NT, ND Extremity:  Pulses positive and No Edema Skin:   Intact Neuro: Alert/Oriented, Cranial Nerve Abnormalities Left central VII, Normal Sensory, Abnormal Motor 3- Left delt, Bi, tri, finger flexor/ext, hip/knee extensor synergy, 2-/5 at ankle, 4/5 in RUE and RLE ---motor exam stable. No nystagmus Musc/Skel:  Other no pain with UE or LE ROM Gen NAD   Assessment/Plan: 1. Functional deficits secondary to acute left hemiparesis, subacute right hemiparesis which require 3+ hours per day of interdisciplinary therapy in a comprehensive inpatient rehab setting. Physiatrist is providing close team supervision and 24 hour management of active medical problems listed below. Physiatrist and rehab team continue to assess barriers to discharge/monitor patient progress toward functional and medical goals. FIM: Function - Bathing Position: Shower Body parts bathed by patient: Right arm, Left arm, Chest, Abdomen, Front perineal area, Buttocks, Right upper leg, Left upper leg, Right lower leg, Left lower leg Body parts bathed by helper: Back Assist Level: Supervision or verbal cues Assistive Device Comment: long handled sponge  Function- Upper Body Dressing/Undressing What is the patient wearing?: Pull over shirt/dress Pull over shirt/dress - Perfomed by patient:  Thread/unthread left sleeve, Put head through opening, Thread/unthread right sleeve, Pull shirt over trunk Pull over shirt/dress - Perfomed by helper: Put head through opening, Pull shirt over trunk Assist Level: More than reasonable time Set up : To obtain clothing/put away Function - Lower Body Dressing/Undressing What is the patient wearing?: Pants, Non-skid slipper socks Position: Other (comment) (wc in bathroom) Pants- Performed by patient: Thread/unthread right pants leg, Thread/unthread left pants leg Pants- Performed by helper: Pull pants up/down Non-skid slipper socks- Performed by patient: Don/doff left sock, Don/doff right sock Non-skid slipper socks- Performed by helper: Don/doff left sock Socks - Performed by patient: Don/doff right sock, Don/doff left sock Shoes - Performed by patient: Fasten right, Don/doff right shoe, Don/doff left shoe, Fasten left Shoes - Performed by helper: Don/doff right shoe, Don/doff left shoe, Fasten right, Fasten left AFO - Performed by patient: Don/doff right AFO, Don/doff left AFO AFO - Performed by helper: Don/doff left AFO, Don/doff right AFO Assist for footwear: Supervision/touching assist Assist for lower body dressing: Touching or steadying assistance (Pt > 75%) Assistive Device Comment: sock-aid  Function - Toileting Toileting activity did not occur: N/A Toileting steps completed by patient: Performs perineal hygiene Toileting steps completed by helper: Adjust clothing after toileting, Performs perineal hygiene Toileting Assistive Devices: Grab bar or rail Assist level: Touching or steadying assistance (Pt.75%)  Function - Archivist transfer assistive device: Grab bar Assist level to toilet: Touching or steadying assistance (Pt > 75%) Assist level from toilet: Touching or steadying assistance (Pt > 75%)  Function - Chair/bed transfer Chair/bed transfer method: Squat pivot Chair/bed transfer assist level: Touching or  steadying assistance (Pt > 75%) Chair/bed transfer assistive device: Armrests, Bedrails  Chair/bed transfer details: Verbal cues for sequencing  Function - Locomotion: Wheelchair Will patient use wheelchair at discharge?: Yes Type: Manual Max wheelchair distance: 100 Assist Level: Supervision or verbal cues Assist Level: Supervision or verbal cues Wheel 150 feet activity did not occur: Safety/medical concerns Turns around,maneuvers to table,bed, and toilet,negotiates 3% grade,maneuvers on rugs and over doorsills: No Function - Locomotion: Ambulation Assistive device: Walker-rolling, Orthosis Max distance: 58' Assist level: Touching or steadying assistance (Pt > 75%) Walk 10 feet activity did not occur: Safety/medical concerns Assist level: Touching or steadying assistance (Pt > 75%) Walk 50 feet with 2 turns activity did not occur: Safety/medical concerns Assist level: Touching or steadying assistance (Pt > 75%) Walk 150 feet activity did not occur: Safety/medical concerns Walk 10 feet on uneven surfaces activity did not occur: Safety/medical concerns  Function - Comprehension Comprehension: Auditory Comprehension assist level: Follows basic conversation/direction with no assist  Function - Expression Expression: Verbal Expression assist level: Expresses basic 50 - 74% of the time/requires cueing 25 - 49% of the time. Needs to repeat parts of sentences.  Function - Social Interaction Social Interaction assist level: Interacts appropriately with others with medication or extra time (anti-anxiety, antidepressant).  Function - Problem Solving Problem solving assist level: Solves basic 90% of the time/requires cueing < 10% of the time  Function - Memory Memory assist level: More than reasonable amount of time Patient normally able to recall (first 3 days only): Current season, That he or she is in a hospital, Staff names and faces  Medical Problem List and Plan: 1. Right  hemiparesis as well as left side weakness/dysarthriasecondary to acute lacunar infarction involving the right medulla oblongata and medial aspect. Subacute large vessel left MCA infarct due to intracranial stenosis-    - CIR PT OT SLP        -continue to work on vestibular exercises provided to him 2. DVT Prophylaxis/Anticoagulation: Subcutaneous Lovenox. Monitor platelet counts of any signs of bleeding 3. Pain Management/chronic back pain: Oxycodone as needed 4. Mood: Xanax 0.5 mg 3 times a day as needed 5. Neuropsych: This patient iscapable of making decisions on hisown behalf. 6. Skin/Wound Care: Routine skin checks 7. Fluids/Electrolytes/Nutrition: Routine I&O's with follow-up chemistries 8.Hyperlipidemia. Lipitor 9.Tobacco abuse counseling 10.Recent left ureteral stone. Has hematuria potentiated by anticoagulants, last Hgb is 14.8 on 9/79/14 13.7  Continue Flomax. Follow-up urology services, has stent placement, scheduled for removal which will be delayed by CVA, uro f/u as OP 11.Hypertension. Norvasc 5 mg daily. good control 9/30   Vitals:   05/06/17 2135 05/07/17 0500  BP: 108/71 114/65  Pulse: 80 73  Resp:  18  Temp:  (!) 97.5 F (36.4 C)  SpO2:  99%  12.  Mild leukocytosis, afebrile 13.  Tachycardia- no anemia, volume status looks normal, EKG show sinus rhythm- will monitor   -increased metoprolol to  bid with good results 14.  Insomnia - remeron, His complaints are inconsistent. May need some relaxation strategies 15.stools remain formed, abx completed 16.  Bladder incont with freq at noc no dysuria, likely spastic bladder  17.  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  UTI with bacteremia: ecoli in urine and blood, enterobacter in blood   -afebrile.  -continue ceftriaxone completed 9/28 19.  Spastic bladder needs timed toileting , urinal at bedside    LOS (Days) 24 A FACE TO FACE EVALUATION WAS PERFORMED  Brendan Charles T 05/07/2017, 8:03 AM

## 2017-05-08 ENCOUNTER — Inpatient Hospital Stay (HOSPITAL_COMMUNITY): Payer: Medicare Other

## 2017-05-08 ENCOUNTER — Ambulatory Visit (HOSPITAL_COMMUNITY): Payer: Medicare Other | Admitting: Physical Therapy

## 2017-05-08 ENCOUNTER — Inpatient Hospital Stay (HOSPITAL_COMMUNITY): Payer: Medicare Other | Admitting: Speech Pathology

## 2017-05-08 ENCOUNTER — Inpatient Hospital Stay (HOSPITAL_COMMUNITY): Payer: Medicare Other | Admitting: Occupational Therapy

## 2017-05-08 NOTE — Plan of Care (Signed)
Problem: RH BLADDER ELIMINATION Goal: RH STG MANAGE BLADDER WITH ASSISTANCE STG Manage Bladder With Min Assistance   Outcome: Progressing Pt having increased continence with use of urinal.

## 2017-05-08 NOTE — Progress Notes (Signed)
Occupational Therapy Weekly Progress Note  Patient Details  Name: Brendan Charles MRN: [id number] Date of Birth: [birth year]  Beginning of progress report period: April 14, 2017 End of progress report period: May 08, 2017  Today's Date: 05/08/2017 OT Individual Time: 1003-1100 OT Individual Time Calculation (min): 57 min    Patient has met 3 of 3 short term goals. Pt is making steady progress towards OT treatments at this time. His transfers are more consistently supervision, his standing balance within BADL tasks has improved to mostly supervision level, and he has demonstrated much improved core stability to maintain sitting balance within functional tasks. P  Patient continues to demonstrate the following deficits: muscle weakness, impaired timing and sequencing, abnormal tone, unbalanced muscle activation, ataxia and decreased coordination, decreased problem solving and delayed processing and decreased sitting balance, decreased standing balance, decreased postural control and hemiplegia and therefore will continue to benefit from skilled OT intervention to enhance overall performance with BADL and Reduce care partner burden.  Patient progressing toward long term goals..  Continue plan of care.  OT Short Term Goals Week 3:  OT Short Term Goal 1 (Week 3): Pt will be able to stand at sink with 75% S to perform grooming tasks. OT Short Term Goal 1 - Progress (Week 3): Not met OT Short Term Goal 2 (Week 3): Pt will complete 2/3 toileting tasks with S. OT Short Term Goal 2 - Progress (Week 3): Met OT Short Term Goal 3 (Week 3): Pt wiill maintain sitting balance during daily task with supervision and verbal cues OT Short Term Goal 3 - Progress (Week 3): Met Week 4:  OT Short Term Goal 1 (Week 4): Stg=LTG 2/2 ELOS  Skilled Therapeutic Interventions/Progress Updates:    Pt greeted in bed and requested to shower. Squat-pivot transfer to L with set-up A of wc and intermittent min guard A  for balance when transferring. Pt propelled wc to closet door with increased time and able to reach in and gather clothing with R UE and return to midline with supervision. Stand-pivot in/out of shower with supervision. Bathing completed with set-up A and pt able to maintain sitting balance throughout with use of long-handled sponge to wash lower body. Worked on dynamic standing balance at the sink with focus on hip and ankle balance strategies without UB support in preparation for ADLs such as pulling pants over hips. Worked on donning AFO's with pt being able to complete with increased time and set-up A. Pt left seated in wc at end of session with needs met and call bell in reach.   Therapy Documentation Precautions:  Precautions Precautions: Fall Precaution Comments: new L hemiparesis; recent CVA with R sided weakness Restrictions Weight Bearing Restrictions: No Pain: Pain Assessment Pain Assessment: No/denies pain Pain Score: 0-No pain ADL: ADL ADL Comments: Please see functional navigator  See Function Navigator for Current Functional Status.   Therapy/Group: Individual Therapy  [name] 05/08/2017, 10:28 AM

## 2017-05-08 NOTE — Progress Notes (Addendum)
Subjective/Complaints:  No issues ovenite, using urinal at bedside but still with nocturnal incont, doesn't want meds  ROS: pt denies nausea, vomiting, diarrhea, cough, shortness of breath or chest pain   Objective: Vital Signs: Blood pressure 115/67, pulse 77, temperature 97.7 F (36.5 C), temperature source Oral, resp. rate 18, height  (1.803 m), weight 71.5 kg (157 lb 11.1 oz), SpO2 98 %. No results found. No results found for this or any previous visit (from the past 72 hour(s)).   HEENT: normal and dysarthric Cardio: RRR without murmur. No JVD     Resp: CTA Bilaterally without wheezes or rales. Normal effort     GI: BS positive and NT, ND Extremity:  Pulses positive and No Edema Skin:   Intact Neuro: Alert/Oriented, Cranial Nerve Abnormalities Left central VII, Normal Sensory, Abnormal Motor 3- Left delt, Bi, tri, finger flexor/ext, hip/knee extensor synergy, 2-/5 at ankle, 4/5 in RUE and RLE ---motor exam stable. No nystagmus Musc/Skel:  Other no pain with UE or LE ROM Gen NAD   Assessment/Plan: 1. Functional deficits secondary to acute left hemiparesis, subacute right hemiparesis which require 3+ hours per day of interdisciplinary therapy in a comprehensive inpatient rehab setting. Physiatrist is providing close team supervision and 24 hour management of active medical problems listed below. Physiatrist and rehab team continue to assess barriers to discharge/monitor patient progress toward functional and medical goals. FIM: Function - Bathing Position: Shower Body parts bathed by patient: Right arm, Left arm, Chest, Abdomen, Front perineal area, Buttocks, Right upper leg, Left upper leg, Right lower leg, Left lower leg Body parts bathed by helper: Back Assist Level: Supervision or verbal cues Assistive Device Comment: long handled sponge  Function- Upper Body Dressing/Undressing What is the patient wearing?: Pull over shirt/dress Pull over shirt/dress - Perfomed  by patient: Thread/unthread left sleeve, Put head through opening, Thread/unthread right sleeve, Pull shirt over trunk Pull over shirt/dress - Perfomed by helper: Put head through opening, Pull shirt over trunk Assist Level: More than reasonable time Set up : To obtain clothing/put away Function - Lower Body Dressing/Undressing What is the patient wearing?: Pants, AFO, Shoes, Ted Hose Position: Wheelchair/chair at [employer]- Performed by patient: Thread/unthread right pants leg, Thread/unthread left pants leg, Pull pants up/down Pants- Performed by helper: Pull pants up/down Non-skid slipper socks- Performed by patient: Don/doff left sock, Don/doff right sock Non-skid slipper socks- Performed by helper: Don/doff left sock Socks - Performed by patient: Don/doff right sock, Don/doff left sock Shoes - Performed by patient: Fasten right, Don/doff right shoe, Don/doff left shoe, Fasten left Shoes - Performed by helper: Don/doff right shoe, Don/doff left shoe, Fasten right, Fasten left AFO - Performed by patient: Don/doff right AFO, Don/doff left AFO AFO - Performed by helper: Don/doff left AFO, Don/doff right AFO TED Hose - Performed by helper: Don/doff right TED hose, Don/doff left TED hose Assist for footwear: Setup Assist for lower body dressing: Supervision or verbal cues Assistive Device Comment: sock-aid  Function - Toileting Toileting activity did not occur: N/A Toileting steps completed by patient: Performs perineal hygiene Toileting steps completed by helper: Adjust clothing after toileting, Performs perineal hygiene Toileting Assistive Devices: Grab bar or rail Assist level: Touching or steadying assistance (Pt.75%)  Function - Archivist transfer assistive device: Grab bar Assist level to toilet: Touching or steadying assistance (Pt > 75%) Assist level from toilet: Touching or steadying assistance (Pt > 75%)  Function - Chair/bed transfer Chair/bed transfer  method: Squat pivot Chair/bed transfer assist  level: Supervision or verbal cues Chair/bed transfer assistive device: Armrests Chair/bed transfer details: Verbal cues for sequencing  Function - Locomotion: Wheelchair Will patient use wheelchair at discharge?: Yes Type: Manual Max wheelchair distance: 100 Assist Level: Supervision or verbal cues Assist Level: Supervision or verbal cues Wheel 150 feet activity did not occur: Safety/medical concerns Turns around,maneuvers to table,bed, and toilet,negotiates 3% grade,maneuvers on rugs and over doorsills: No Function - Locomotion: Ambulation Assistive device: Walker-rolling, Orthosis Max distance: 79' Assist level: Touching or steadying assistance (Pt > 75%) Walk 10 feet activity did not occur: Safety/medical concerns Assist level: Touching or steadying assistance (Pt > 75%) Walk 50 feet with 2 turns activity did not occur: Safety/medical concerns Assist level: Touching or steadying assistance (Pt > 75%) Walk 150 feet activity did not occur: Safety/medical concerns Walk 10 feet on uneven surfaces activity did not occur: Safety/medical concerns  Function - Comprehension Comprehension: Auditory Comprehension assist level: Follows basic conversation/direction with no assist  Function - Expression Expression: Verbal Expression assist level: Expresses basic 50 - 74% of the time/requires cueing 25 - 49% of the time. Needs to repeat parts of sentences.  Function - Social Interaction Social Interaction assist level: Interacts appropriately with others with medication or extra time (anti-anxiety, antidepressant).  Function - Problem Solving Problem solving assist level: Solves basic 90% of the time/requires cueing < 10% of the time  Function - Memory Memory assist level: More than reasonable amount of time Patient normally able to recall (first 3 days only): Current season, That he or she is in a hospital, Staff names and faces  Medical  Problem List and Plan: 1. Right hemiparesis as well as left side weakness/dysarthriasecondary to acute lacunar infarction involving the right medulla oblongata and medial aspect. Subacute large vessel left MCA infarct due to intracranial stenosis-    - CIR PT OT SLP        -continue to work on vestibular exercises provided to him 2. DVT Prophylaxis/Anticoagulation: Subcutaneous Lovenox. Monitor platelet counts of any signs of bleeding 3. Pain Management/chronic back pain: Oxycodone as needed 4. Mood: Xanax 0.5 mg 3 times a day as needed 5. Neuropsych: This patient iscapable of making decisions on hisown behalf. 6. Skin/Wound Care: Routine skin checks 7. Fluids/Electrolytes/Nutrition: Routine I&O's with follow-up chemistries 8.Hyperlipidemia. Lipitor 9.Tobacco abuse counseling 10.Recent left ureteral stone. Has hematuria potentiated by anticoagulants, last Hgb is 14.8 on 9/79/14 13.7  Continue Flomax. Follow-up urology services, has stent placement, scheduled for removal which will be delayed by CVA, uro f/u as OP 11.Hypertension. Norvasc 5 mg daily. good control 10/1   Vitals:   05/07/17 1440 05/08/17 0541  BP: 105/67 115/67  Pulse: 80 77  Resp: 20 18  Temp: (!) 97.5 F (36.4 C) 97.7 F (36.5 C)  SpO2: 98% 98%  12.  Mild leukocytosis, afebrile 13.  Tachycardia- no anemia, volume status looks normal, EKG show sinus rhythm- will monitor   -increased metoprolol to  bid with rate in 70-80s 14.  Insomnia - remeron, His complaints are inconsistent. May need some relaxation strategies 15.stools remain formed, abx completed 16.  Bladder incont with freq at noc no dysuria, likely spastic bladder  17.  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  UTI with bacteremia: ecoli in urine and blood, enterobacter in blood   -afebrile.  -continue ceftriaxone completed 9/28 19.  Spastic bladder needs timed toileting , urinal at bedside    LOS (Days) 25 A FACE TO FACE EVALUATION  WAS PERFORMED  Claudette Laws E  05/08/2017, 7:26 AM

## 2017-05-08 NOTE — Progress Notes (Signed)
Modified Barium Swallow Progress Note  Patient Details  Name: Brendan Charles MRN: [id number] Date of Birth: [birth year]  Today's Date: 05/08/2017  Modified Barium Swallow completed.  Full report located under Chart Review in the Imaging Section.  Brief recommendations include the following:  Clinical Impression  Patient demonstrates a moderate sensory-motor oropharyngeal dysphagia that appears improved from previous MBS. Oral phase is characterized by poor oral control and bolus cohesion leading to premature spillage of all consistencies.  Patient's pharyngeal phase is characterized by delayed swallow initiation with decreased base of tongue retraction resulting in intermittent silent penetration and aspiration of thin liquids via tsp and cup. However, amount of aspirates and episodes of aspiration have decreased since previous MBS. Although patient continues to demonstrate increased cough strength, it is not strong enough to expectorate aspirates. A variety of compensatory strategies and postural changes were attempted throughout the study with intermittent success. Suspect due to impaired timing/coordination. However, a head turn to the right was consistently effective in reducing and almost eliminating moderate vallecular residue with solid textures. Recommend patient upgrade to Dys. 2 textures with use of a head turn to the right and continue nectar-thick liquids. Prognosis for solids upgrade is good as patient's habitation of the strategy improves. Also recommend patient continue RMT, pharyngeal strengthening exercises and water protocol. Discussed results of MBS with the patient and he verbalized understanding and agreement.   Swallow Evaluation Recommendations       SLP Diet Recommendations: Free water protocol after oral care;Nectar thick liquid;Dysphagia 2 (Fine chop) solids   Liquid Administration via: Cup   Medication Administration: Crushed with puree   Supervision: Patient able  to self feed;Full supervision/cueing for compensatory strategies   Compensations: Slow rate;Small sips/bites;Minimize environmental distractions;Multiple dry swallows after each bite/sip;Clear throat intermittently;Other (Comment) (head turn to right)   Postural Changes: Seated upright at 90 degrees   Oral Care Recommendations: Oral care BID   Other Recommendations: Order thickener from pharmacy;Have oral suction available;Prohibited food (jello, ice cream, thin soups);Remove water pitcher    [name] 05/08/2017,3:05 PM

## 2017-05-08 NOTE — Progress Notes (Signed)
Speech Language Pathology Daily Session Note  Patient Details  Name: Brendan Charles MRN: [id number] Date of Birth: Dec 01, 1955  Today's Date: 05/08/2017 SLP Individual Time: 1205-1250 SLP Individual Time Calculation (min): 45 min  Short Term Goals: Week 4: SLP Short Term Goal 1 (Week 4): Patient will perform pharyngeal strengthening exercises with supervision verbal cues for accuracy.  SLP Short Term Goal 2 (Week 4): Patient will perform 25 repetitions of EMST/IMST excerises with Min A verbal cues for accuracy and a self-percieved effort level of 8/10.  SLP Short Term Goal 3 (Week 4): Patient will utilize speech intelligibility strategies to maximize speech intelligibility at the phrase level to 90% with supervision verbal cues. SLP Short Term Goal 4 (Week 4): Patient will demonstrate efficent mastication of Dys. 2 textures without overt s/s of aspiration with Min A verbal cues over 2 sessions prior to upgrade.   Skilled Therapeutic Interventions: Skilled treatment session focused on dysphagia goals. SLP facilitated session by providing Mod A verbal cues for use of a head turn to the right with lunch meal of Dys. 2 textures with nectar-thick liquids. Patient demonstrated an intermittent wet vocal quality X 3 that cleared with a cued throat clear, suspect due to ue to imparled coordination of the head turn which probably increased residuals. Recommend patient continue current diet with full supervision to maximize utilization of swallowing strategies. Also re-educated on the importance of utilizing the head to the right to maximize safety with current diet. He verbalized understanding but will need reinforcement. Patient left upright in wheelchair with NT present. Continue with current plan of care.      Function:  Eating Eating   Modified Consistency Diet: Yes Eating Assist Level: Set up assist for;Supervision or verbal cues   Eating Set Up Assist For: Opening containers        Cognition Comprehension Comprehension assist level: Follows basic conversation/direction with no assist  Expression   Expression assist level: Expresses basic 50 - 74% of the time/requires cueing 25 - 49% of the time. Needs to repeat parts of sentences.  Social Interaction Social Interaction assist level: Interacts appropriately with others with medication or extra time (anti-anxiety, antidepressant).  Problem Solving Problem solving assist level: Solves basic 90% of the time/requires cueing < 10% of the time  Memory Memory assist level: More than reasonable amount of time    Pain No/Denies Pain   Therapy/Group: Individual Therapy  Sebastyan Snodgrass 05/08/2017, 3:06 PM

## 2017-05-08 NOTE — Progress Notes (Signed)
Social Work Patient ID: Brendan Charles, male   DOB: 1956-03-25, 61 y.o.   MRN: [id number]  Met with [name] who is here for family training with PT. Discussed equipment needs and follow up needs. He feels pt will need a hospital bed for the short term. Will ask PT and see her recommendations regarding this. Gathering equipment needs and will order prior to discharge.

## 2017-05-09 ENCOUNTER — Inpatient Hospital Stay (HOSPITAL_COMMUNITY): Payer: Medicare Other | Admitting: Physical Therapy

## 2017-05-09 ENCOUNTER — Inpatient Hospital Stay (HOSPITAL_COMMUNITY): Payer: Medicare Other | Admitting: Occupational Therapy

## 2017-05-09 ENCOUNTER — Inpatient Hospital Stay (HOSPITAL_COMMUNITY): Payer: Medicare Other

## 2017-05-09 NOTE — Progress Notes (Signed)
Physical Therapy Session Note  Patient Details  Name: Brendan Charles MRN: [id number] Date of Birth: 1956/06/25  Today's Date: 05/09/2017 PT Individual Time: 0900-0930 PT Individual Time Calculation (min): 30 min   Short Term Goals: Week 3:  PT Short Term Goal 1 (Week 3): Pt will ambulate 30' with RW and consistent min assist.  PT Short Term Goal 2 (Week 3): Pt will negotiate 5 steps with 1 rail and min assist PT Short Term Goal 3 (Week 3): Pt will attempt curb step negotiation with RW with PT.   Skilled Therapeutic Interventions/Progress Updates: Pt presented in bed agreeable to therapy. Pt donned shorts and socks with supervision and additional time. Performed supine to sit with supervision and additional time. Session focused on gait with pt ambulating 81f x 2 with minA occasional min guard. Pt provided min verbal cues for RLE placement and sequencing as noted poor clearance when becoming fatigued. Required extended seated rest between bouts due to fatigue. Pt returned to room at end of session and returned to bed in same manner as prior. Pt left in bed with alarm on and call bell within reach with needs met.      Therapy Documentation Precautions:  Precautions Precautions: Fall Precaution Comments: new L hemiparesis; recent CVA with R sided weakness Restrictions Weight Bearing Restrictions: No  See Function Navigator for Current Functional Status.   Therapy/Group: Individual Therapy  Emali Heyward  Shavontae Gibeault, PTA  05/09/2017, 1:15 PM

## 2017-05-09 NOTE — Progress Notes (Signed)
Speech Language Pathology Daily Session Note  Patient Details  Name: Brendan Charles MRN: [id number] Date of Birth: 02-11-1956  Today's Date: 05/09/2017 SLP Individual Time: 0100-0143 SLP Individual Time Calculation (min): 43 min  Short Term Goals: Week 4: SLP Short Term Goal 1 (Week 4): Patient will perform pharyngeal strengthening exercises with supervision verbal cues for accuracy.  SLP Short Term Goal 2 (Week 4): Patient will perform 25 repetitions of EMST/IMST excerises with Min A verbal cues for accuracy and a self-percieved effort level of 8/10.  SLP Short Term Goal 3 (Week 4): Patient will utilize speech intelligibility strategies to maximize speech intelligibility at the phrase level to 90% with supervision verbal cues. SLP Short Term Goal 4 (Week 4): Patient will demonstrate efficent mastication of Dys. 2 textures without overt s/s of aspiration with Min A verbal cues over 2 sessions prior to upgrade.   Skilled Therapeutic Interventions: Skilled ST services focused on swallow goals. SLP facilitated ability to utilize new swallow strategies, head turn to right on all solids and liquids, multiple swallows and intermittent throat clear, on Dys 2 requiring Mod A verbal cues to recall call all three strategies and by the end of the session Min A verbal cues to recall. Pt demonstrated ability top preform swallow strategies during PO consumption of Dys 2 with Min A verbal cues and no overt s/s aspiration. Pt returned demonstration of RMT exercises preforming EMT at level 15 and IMST at level 25 for 25 repetitions stating effort level 9-10. Pt was left in chair in room with call bell within reach. Recommend to continue ST services.       Function:  Eating Eating   Modified Consistency Diet: Yes Eating Assist Level: Set up assist for;Supervision or verbal cues   Eating Set Up Assist For: Opening containers       Cognition Comprehension    Expression      Social Interaction     Problem Solving    Memory      Pain Pain Assessment Pain Score: 4  Pain Type: Acute pain Pain Location: Back  Therapy/Group: Individual Therapy  Brendan Charles  [facility] 05/09/2017, 1:43 PM

## 2017-05-09 NOTE — Progress Notes (Signed)
Orthopedic Tech Progress Note Patient Details:  Brendan Charles 13-Jul-1956 [id number] Called [facility] for brace. Patient ID: Brendan Charles, male   DOB: 1956-02-22, 61 y.o.   MRN: [id number]   Brendan Charles 05/09/2017, 3:43 PM

## 2017-05-09 NOTE — Progress Notes (Signed)
Subjective/Complaints:  Pt stated urine looked brown yesterday but now is yellow again, no burning with urination or freq  ROS: pt denies nausea, vomiting, diarrhea, cough, shortness of breath or chest pain   Objective: Vital Signs: Blood pressure (!) 96/58, pulse 73, temperature 98.5 F (36.9 C), temperature source Oral, resp. rate 20, height  (1.803 m), weight 71.5 kg (157 lb 11.1 oz), SpO2 99 %. Dg Swallowing Func-speech Pathology  Result Date: 05/08/2017 Objective Swallowing Evaluation: Type of Study: MBS-Modified Barium Swallow Study Patient Details Name: Brendan Charles MRN: [id number] Date of Birth: [birth year] Today's Date: 05/08/2017 Time: SLP Start Time (ACUTE ONLY): 0910-SLP Stop Time (ACUTE ONLY): 0950 SLP Time Calculation (min) (ACUTE ONLY): 40 min Past Medical History: Past Medical History: Diagnosis Date . Anxiety  . Chronic back pain  . CVA (cerebral vascular accident) (HCC) 03/20/2017 . Hepatitis C   HEP C, never treated, 2007 Past Surgical History: Past Surgical History: Procedure Laterality Date . CERVICAL DISC SURGERY  2001  anterior . COLONOSCOPY WITH PROPOFOL N/A 03/14/2017  Procedure: COLONOSCOPY WITH PROPOFOL;  Surgeon: West Bali, MD;  Location: [facility];  Service: Endoscopy;  Laterality: N/A;  8:15am . CYSTOSCOPY W/ URETERAL STENT PLACEMENT Left 03/23/2017  Procedure: CYSTOSCOPY WITH LEFT RETROGRADE PYELOGRAM/URETERAL LEFT STENT PLACEMENT;  Surgeon: Malen Gauze, MD;  Location: [facility];  Service: Urology;  Laterality: Left; . LUMBAR SPINE SURGERY  2015 . POLYPECTOMY  03/14/2017  Procedure: POLYPECTOMY;  Surgeon: West Bali, MD;  Location: [facility];  Service: Endoscopy;;  rectum HPI: See H&P  Subjective: "I had a hard time chewing today." Assessment / Plan / Recommendation CHL IP CLINICAL IMPRESSIONS 05/08/2017 Clinical Impression Patient demonstrates a moderate sensory-motor oropharyngeal dysphagia that appears improved from previous MBS. Oral phase is  characterized by poor oral control and bolus cohesion leading to premature spillage of all consistencies.  Patient's pharyngeal phase is characterized by delayed swallow initiation with decreased base of tongue retraction resulting in intermittent silent penetration and aspiration of thin liquids via tsp and cup. However, amount of aspirates and episodes of aspiration have decreased since previous MBS. Although patient continues to demonstrate increased cough strength, it is not strong enough to expectorate aspirates. A variety of compensatory strategies and postural changes were attempted throughout the study with intermittent success. Suspect due to impaired timing/coordination. However, a head turn to the right was consistently effective in reducing and almost eliminating moderate vallecular residue with solid textures. Recommend patient upgrade to Dys. 2 textures with use of a head turn to the right and continue nectar-thick liquids. Prognosis for solids upgrade is good as patient's habitation of the strategy improves. Also recommend patient continue RMT, pharyngeal strengthening exercises and water protocol. Discussed results of MBS with the patient and he verbalized understanding and agreement. SLP Visit Diagnosis Dysphagia, oropharyngeal phase (R13.12) Attention and concentration deficit following -- Frontal lobe and executive function deficit following -- Impact on safety and function Moderate aspiration risk;Severe aspiration risk   CHL IP TREATMENT RECOMMENDATION 05/08/2017 Treatment Recommendations Therapy as outlined in treatment plan below   Prognosis 05/08/2017 Prognosis for Safe Diet Advancement Good Barriers to Reach Goals Severity of deficits Barriers/Prognosis Comment -- CHL IP DIET RECOMMENDATION 05/08/2017 SLP Diet Recommendations Free water protocol after oral care;Nectar thick liquid;Dysphagia 2 (Fine chop) solids Liquid Administration via Cup Medication Administration Crushed with puree  Compensations Slow rate;Small sips/bites;Minimize environmental distractions;Multiple dry swallows after each bite/sip;Clear throat intermittently;Other (Comment) Postural Changes Seated upright at 90 degrees  CHL IP OTHER RECOMMENDATIONS 05/08/2017 Recommended Consults -- Oral Care Recommendations Oral care BID Other Recommendations Order thickener from pharmacy;Have oral suction available;Prohibited food (jello, ice cream, thin soups);Remove water pitcher   CHL IP FOLLOW UP RECOMMENDATIONS 05/08/2017 Follow up Recommendations Inpatient Rehab   CHL IP FREQUENCY AND DURATION 05/08/2017 Speech Therapy Frequency (ACUTE ONLY) min 5x/week Treatment Duration 1 week      CHL IP ORAL PHASE 05/08/2017 Oral Phase Impaired Oral - Pudding Teaspoon -- Oral - Pudding Cup -- Oral - Honey Teaspoon NT Oral - Honey Cup NT Oral - Nectar Teaspoon NT Oral - Nectar Cup Delayed oral transit;Premature spillage;Decreased bolus cohesion Oral - Nectar Straw -- Oral - Thin Teaspoon Premature spillage;Delayed oral transit;Decreased bolus cohesion Oral - Thin Cup Premature spillage;Decreased bolus cohesion;Delayed oral transit Oral - Thin Straw -- Oral - Puree Premature spillage;Decreased bolus cohesion;Delayed oral transit Oral - Mech Soft Decreased bolus cohesion;Delayed oral transit;Premature spillage Oral - Regular -- Oral - Multi-Consistency -- Oral - Pill -- Oral Phase - Comment --  CHL IP PHARYNGEAL PHASE 05/08/2017 Pharyngeal Phase Impaired Pharyngeal- Pudding Teaspoon -- Pharyngeal -- Pharyngeal- Pudding Cup -- Pharyngeal -- Pharyngeal- Honey Teaspoon NT Pharyngeal -- Pharyngeal- Honey Cup -- Pharyngeal -- Pharyngeal- Nectar Teaspoon NT Pharyngeal -- Pharyngeal- Nectar Cup Reduced epiglottic inversion;Penetration/Aspiration during swallow;Reduced laryngeal elevation;Reduced anterior laryngeal mobility;Reduced tongue base retraction;Delayed swallow initiation-vallecula Pharyngeal Material enters airway, CONTACTS cords and not ejected out  Pharyngeal- Nectar Straw -- Pharyngeal -- Pharyngeal- Thin Teaspoon Delayed swallow initiation-pyriform sinuses;Reduced laryngeal elevation;Reduced anterior laryngeal mobility;Reduced epiglottic inversion;Reduced tongue base retraction;Penetration/Aspiration during swallow Pharyngeal Material enters airway, remains ABOVE vocal cords and not ejected out Pharyngeal- Thin Cup Delayed swallow initiation-pyriform sinuses;Reduced laryngeal elevation;Reduced anterior laryngeal mobility;Reduced epiglottic inversion;Reduced tongue base retraction;Penetration/Aspiration during swallow Pharyngeal Material enters airway, remains ABOVE vocal cords and not ejected out;Material enters airway, passes BELOW cords and not ejected out despite cough attempt by patient;Material enters airway, passes BELOW cords without attempt by patient to eject out (silent aspiration) Pharyngeal- Thin Straw -- Pharyngeal -- Pharyngeal- Puree Delayed swallow initiation-vallecula;Pharyngeal residue - valleculae;Reduced tongue base retraction;Reduced epiglottic inversion Pharyngeal -- Pharyngeal- Mechanical Soft Delayed swallow initiation-vallecula;Pharyngeal residue - valleculae Pharyngeal -- Pharyngeal- Regular -- Pharyngeal -- Pharyngeal- Multi-consistency -- Pharyngeal -- Pharyngeal- Pill -- Pharyngeal -- Pharyngeal Comment --  CHL IP CERVICAL ESOPHAGEAL PHASE 04/14/2017 Cervical Esophageal Phase Impaired Pudding Teaspoon -- Pudding Cup -- Honey Teaspoon -- Honey Cup -- Nectar Teaspoon -- Nectar Cup -- Nectar Straw -- Thin Teaspoon -- Thin Cup -- Thin Straw -- Puree -- Mechanical Soft -- Regular -- Multi-consistency -- Pill -- Cervical Esophageal Comment -- Weston Anna, MA, CCC-SLP [phone] No flowsheet data found. PAYNE, [place] 05/08/2017, 3:03 PM              No results found for this or any previous visit (from the past 72 hour(s)).   HEENT: normal and dysarthric Cardio: RRR without murmur. No JVD     Resp: CTA Bilaterally without wheezes  or rales. Normal effort     GI: BS positive and NT, ND Extremity:  Pulses positive and No Edema Skin:   Intact Neuro: Alert/Oriented, Cranial Nerve Abnormalities Left central VII, Normal Sensory, Abnormal Motor 3- Left delt, Bi, tri, finger flexor/ext, hip/knee extensor synergy, 2-/5 at ankle, 4/5 in RUE and RLE ---motor exam stable. No nystagmus Musc/Skel:  Other no pain with UE or LE ROM Gen NAD   Assessment/Plan: 1. Functional deficits secondary to acute left hemiparesis, subacute right hemiparesis which require 3+ hours  per day of interdisciplinary therapy in a comprehensive inpatient rehab setting. Physiatrist is providing close team supervision and 24 hour management of active medical problems listed below. Physiatrist and rehab team continue to assess barriers to discharge/monitor patient progress toward functional and medical goals. FIM: Function - Bathing Position: Shower Body parts bathed by patient: Right arm, Left arm, Chest, Abdomen, Front perineal area, Buttocks, Right upper leg, Left upper leg, Right lower leg, Left lower leg Body parts bathed by helper: Back Assist Level: Supervision or verbal cues Assistive Device Comment: long handled sponge  Function- Upper Body Dressing/Undressing What is the patient wearing?: Pull over shirt/dress Pull over shirt/dress - Perfomed by patient: Thread/unthread left sleeve, Put head through opening, Thread/unthread right sleeve, Pull shirt over trunk Pull over shirt/dress - Perfomed by helper: Put head through opening, Pull shirt over trunk Assist Level: More than reasonable time Set up : To obtain clothing/put away Function - Lower Body Dressing/Undressing What is the patient wearing?: Pants, AFO, Shoes, Ted Hose Position: Wheelchair/chair at [employer]- Performed by patient: Thread/unthread right pants leg, Thread/unthread left pants leg, Pull pants up/down Pants- Performed by helper: Pull pants up/down Non-skid slipper socks-  Performed by patient: Don/doff left sock, Don/doff right sock Non-skid slipper socks- Performed by helper: Don/doff left sock Socks - Performed by patient: Don/doff right sock, Don/doff left sock Shoes - Performed by patient: Fasten right, Don/doff right shoe, Don/doff left shoe, Fasten left Shoes - Performed by helper: Don/doff right shoe, Don/doff left shoe, Fasten right, Fasten left AFO - Performed by patient: Don/doff right AFO, Don/doff left AFO AFO - Performed by helper: Don/doff left AFO, Don/doff right AFO TED Hose - Performed by helper: Don/doff right TED hose, Don/doff left TED hose Assist for footwear: Setup Assist for lower body dressing: Supervision or verbal cues Assistive Device Comment: sock-aid  Function - Toileting Toileting activity did not occur: N/A Toileting steps completed by patient: Performs perineal hygiene Toileting steps completed by helper: Adjust clothing after toileting, Performs perineal hygiene Toileting Assistive Devices: Grab bar or rail Assist level: Touching or steadying assistance (Pt.75%)  Function - Archivist transfer assistive device: Grab bar Assist level to toilet: Touching or steadying assistance (Pt > 75%) Assist level from toilet: Touching or steadying assistance (Pt > 75%)  Function - Chair/bed transfer Chair/bed transfer method: Ambulatory, Squat pivot (supervision for squat/pivot) Chair/bed transfer assist level: Touching or steadying assistance (Pt > 75%) Chair/bed transfer assistive device: Armrests, Walker Chair/bed transfer details: Verbal cues for sequencing  Function - Locomotion: Wheelchair Will patient use wheelchair at discharge?: Yes Type: Manual Max wheelchair distance: 150 Assist Level: Supervision or verbal cues Assist Level: Supervision or verbal cues Wheel 150 feet activity did not occur: Safety/medical concerns Assist Level: Supervision or verbal cues Turns around,maneuvers to table,bed, and  toilet,negotiates 3% grade,maneuvers on rugs and over doorsills: No Function - Locomotion: Ambulation Assistive device: Walker-rolling, Orthosis Max distance: 32' Assist level: Touching or steadying assistance (Pt > 75%) Walk 10 feet activity did not occur: Safety/medical concerns Assist level: Touching or steadying assistance (Pt > 75%) Walk 50 feet with 2 turns activity did not occur: Safety/medical concerns Assist level: Touching or steadying assistance (Pt > 75%) Walk 150 feet activity did not occur: Safety/medical concerns Walk 10 feet on uneven surfaces activity did not occur: Safety/medical concerns  Function - Comprehension Comprehension: Auditory Comprehension assist level: Follows basic conversation/direction with no assist  Function - Expression Expression: Verbal Expression assist level: Expresses basic 50 - 74% of the  time/requires cueing 25 - 49% of the time. Needs to repeat parts of sentences.  Function - Social Interaction Social Interaction assist level: Interacts appropriately with others with medication or extra time (anti-anxiety, antidepressant).  Function - Problem Solving Problem solving assist level: Solves basic 90% of the time/requires cueing < 10% of the time  Function - Memory Memory assist level: More than reasonable amount of time Patient normally able to recall (first 3 days only): Current season, That he or she is in a hospital, Staff names and faces  Medical Problem List and Plan: 1. Right hemiparesis as well as left side weakness/dysarthriasecondary to acute lacunar infarction involving the right medulla oblongata and medial aspect. Subacute large vessel left MCA infarct due to intracranial stenosis-    - CIR PT OT SLP- team conf in am        -continue to work on vestibular exercises provided to him 2. DVT Prophylaxis/Anticoagulation: Subcutaneous Lovenox. Monitor platelet counts of any signs of bleeding 3. Pain Management/chronic back pain:  Oxycodone as needed 4. Mood: Xanax 0.5 mg takes qhs- will not be a d/c med  Will d/c on Mirtazapine 5. Neuropsych: This patient iscapable of making decisions on hisown behalf. 6. Skin/Wound Care: Routine skin checks 7. Fluids/Electrolytes/Nutrition: Routine I&O's with follow-up chemistries 8.Hyperlipidemia. Lipitor 9.Tobacco abuse counseling 10.Recent left ureteral stone. Has hematuria potentiated by anticoagulants, last Hgb is 14.8 on 9/79/14 13.7  Continue Flomax. Follow-up urology services, has stent placement, scheduled for removal which will be delayed by CVA, uro f/u as OP 11.Hypertension. Norvasc 2.5 mg daily. Running on low side 10/2, D/C norvasc    Vitals:   05/08/17 1451 05/09/17 0506  BP: 107/75 (!) 96/58  Pulse: 88 73  Resp: 18 20  Temp: 97.8 F (36.6 C) 98.5 F (36.9 C)  SpO2: 97% 99%   13.  Tachycardia- improved, volume status looks normal, EKG show sinus rhythm- will monitor   -increased metoprolol to  bid with rate in 70-80s, BPs also running lower as a result 14.  Insomnia - remeron, His complaints are inconsistent. May need some relaxation strategies 15.stools remain formed, abx completed 16.  Bladder incont with freq at noc no dysuria, likely spastic bladder  17.  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  UTI with bacteremia: ecoli in urine and blood, enterobacter in blood   -afebrile.  -continue ceftriaxone completed 9/28 19.  Spastic bladder using urinal at bedside    LOS (Days) 26 A FACE TO FACE EVALUATION WAS PERFORMED  Lakiya Cottam E 05/09/2017, 7:14 AM

## 2017-05-09 NOTE — Progress Notes (Signed)
Physical Therapy Session Note  Patient Details  Name: Brendan Charles MRN: [id number] Date of Birth: 1956/06/03  Today's Date: 05/09/2017 PT Individual Time: 1415-1530 PT Individual Time Calculation (min): 75 min   Short Term Goals: Week 3:  PT Short Term Goal 1 (Week 3): Pt will ambulate 30' with RW and consistent min assist.  PT Short Term Goal 2 (Week 3): Pt will negotiate 5 steps with 1 rail and min assist PT Short Term Goal 3 (Week 3): Pt will attempt curb step negotiation with RW with PT.   Skilled Therapeutic Interventions/Progress Updates:    no c/o pain throughout session except for tiredness in legs during exercise; Session focused on functional mobility. LE strengthening,  and activity tolerance.   Pt performed transfers throughout session with min A with steadying due to fatigue. Pt reported having both high and low pile carpet in his house but only low pile in bedroom. Pt ambulated on low pile carpet 34' x 2 with RW with rest breaks between with min A for steadying and verbal cues for RW position. Pt performed NuStep on workload 5 x 10 min to work on LE strengthening and activity tolerance, frequent rest breaks required due to leg tiredness and overall fatigue. Pt performed w/c propulsion with B UEs 200' with frequent short rest breaks and supervision to work on activity tolerance. Pt left supine in bed with call bell and all needs addressed.   Therapy Documentation Precautions:  Precautions Precautions: Fall Precaution Comments: new L hemiparesis; recent CVA with R sided weakness Restrictions Weight Bearing Restrictions: No   See Function Navigator for Current Functional Status.   Therapy/Group: Individual Therapy  Dossie Arbour 05/09/2017, 2:54 PM

## 2017-05-09 NOTE — Progress Notes (Addendum)
Occupational Therapy Session Note  Patient Details  Name: Brendan Charles MRN: [id number] Date of Birth: [birth year]  Today's Date: 05/09/2017 OT Individual Time: 1110-1204 OT Individual Time Calculation (min): 54 min    Short Term Goals: Week 4:  OT Short Term Goal 1 (Week 4): Stg=LTG 2/2 ELOS  Skilled Therapeutic Interventions/Progress Updates:    Pt reported need for bathroom upon OT arrival. Pt donned B AFO's seated EOB with set-up A. He was given RW and stood with min guard A from lower bed. Pt ambulated to bathroom with min A and intermittent facilitation to bring R LE forward with steps. Pt able to doff pants and brief in standing with min guard A and 2 posterior LOB requiring Mod A to correct. Discussed safety awarness with pt and importance of fixing balance when he feels he is falling instead of continuing to try to pull down pants. Min A to control descent onto raised toilet. Pt voided bowel and bladder and completed toileting with supervision and set-up a. Worked on alternating UE support to pull pants over hips to help maintain balance during functional task. Pt ambulated out of bathroom to the sink with min cues for RW placement at the sink. Pt utilized hip press onto sink for balance while reaching to turn on water, collect soap, and [paper towels.  Pt took seated rest break then pushed wc to tub room. Pt ambulated from doorway to tub room and completed tub shower transfer with close supervision into tub and min A out to lift R LE. Pt propelled wc back to room for UB strengthening with 1 rest break.  Therapy Documentation Precautions:  Precautions Precautions: Fall Precaution Comments: new L hemiparesis; recent CVA with R sided weakness Restrictions Weight Bearing Restrictions: No Pain: Pain Assessment Pain Assessment: No/denies pain Pain Score: 0-No pain ADL: ADL ADL Comments: Please see functional navigator  See Function Navigator for Current Functional  Status.   Therapy/Group: Individual Therapy  Mal Amabile 05/09/2017, 11:24 AM

## 2017-05-09 NOTE — Progress Notes (Signed)
Orthopedic Tech Progress Note Patient Details:  Brendan Charles May 25, 1956 [id number] Talked to [employer] vendor and they do not provide this brace.  (wheel chair cushion)  Patient ID: [name], male   DOB: 1955-12-09, 61 y.o.   MRN: [id number]   Jennye Moccasin 05/09/2017, 3:52 PM

## 2017-05-09 NOTE — Plan of Care (Signed)
Problem: RH Dressing Goal: LTG Patient will perform upper body dressing (OT) LTG Patient will perform upper body dressing with assist, with/without cues (OT).  Goal downgraded to supervision 10/2-ESD Goal: LTG Patient will perform lower body dressing w/assist (OT) LTG: Patient will perform lower body dressing with assist, with/without cues in positioning using equipment (OT)  Goal downgraded to supervision 10/2-ESD  Problem: RH Toileting Goal: LTG Patient will perform toileting w/assist, cues/equip (OT) LTG: Patient will perform toiletiing (clothes management/hygiene) with assist, with/without cues using equipment (OT)  Goal downgraded 10/2 ESD

## 2017-05-10 ENCOUNTER — Inpatient Hospital Stay (HOSPITAL_COMMUNITY): Payer: Medicare Other | Admitting: Occupational Therapy

## 2017-05-10 ENCOUNTER — Inpatient Hospital Stay (HOSPITAL_COMMUNITY): Payer: Medicare Other | Admitting: Speech Pathology

## 2017-05-10 ENCOUNTER — Inpatient Hospital Stay (HOSPITAL_COMMUNITY): Payer: Medicare Other | Admitting: Physical Therapy

## 2017-05-10 LAB — BASIC METABOLIC PANEL
Anion gap: 7 (ref 5–15)
BUN: 15 mg/dL (ref 6–20)
CHLORIDE: 106 mmol/L (ref 101–111)
CO2: 28 mmol/L (ref 22–32)
Calcium: 9.2 mg/dL (ref 8.9–10.3)
Creatinine, Ser: 0.81 mg/dL (ref 0.61–1.24)
GFR calc Af Amer: >60 mL/min (ref >60)
GFR calc non Af Amer: >60 mL/min (ref >60)
GLUCOSE: 125 mg/dL — AB (ref 65–99)
POTASSIUM: 3.8 mmol/L (ref 3.5–5.1)
Sodium: 141 mmol/L (ref 135–145)

## 2017-05-10 LAB — URINALYSIS, ROUTINE W REFLEX MICROSCOPIC

## 2017-05-10 LAB — URINALYSIS, MICROSCOPIC (REFLEX)

## 2017-05-10 NOTE — Progress Notes (Signed)
Physical Therapy Session Note  Patient Details  Name: Brendan Charles MRN: [id number] Date of Birth: 01-Jan-1956  Today's Date: 05/10/2017 PT Individual Time: 0900-1000 PT Individual Time Calculation (min): 60 min   Short Term Goals: Week 3:  PT Short Term Goal 1 (Week 3): Pt will ambulate 30' with RW and consistent min assist.  PT Short Term Goal 2 (Week 3): Pt will negotiate 5 steps with 1 rail and min assist PT Short Term Goal 3 (Week 3): Pt will attempt curb step negotiation with RW with PT.   Skilled Therapeutic Interventions/Progress Updates:   no c/o pain throughout session; session focused on functional mobility and standing tolerance.   Pt performed transfers throughout session with min A using squat pivot transfer and armrests. Pt performed lower body dressing with pull on pants and socks at bed level with supervision. Pt performed bed mobility with supervision and donned both shoes and AFOs with supervision sitting EOB. Pt performed self propulsion of w/c using B UE up 3% grade ramp in hospital hallway with supervision. Pt performed amb on carpet 40' x 2 with min A and RW with rest breaks between. Pt performed sit > stand x 2 min A from w/c and completed tabletop activity to work on standing tolerance. Pt able to stand for 4 minutes prior to needing a break and able to complete 1.5 puzzles prior to break. After a rest period, pt stood and completed 2nd puzzle prior to returning to room. Pt left upright in recliner with call bell within reach and all needs addressed.   Therapy Documentation Precautions:  Precautions Precautions: Fall Precaution Comments: new L hemiparesis; recent CVA with R sided weakness Restrictions Weight Bearing Restrictions: No  See Function Navigator for Current Functional Status.   Therapy/Group: Individual Therapy  Dossie Arbour 05/10/2017, 11:11 AM

## 2017-05-10 NOTE — Progress Notes (Addendum)
Occupational Therapy Session Note  Patient Details  Name: Brendan Charles MRN: [id number] Date of Birth: Aug 14, 1955  Today's Date: 05/10/2017  Session 1 OT Individual Time: 1583-0940 OT Individual Time Calculation (min): 27 min   Session 2 OT Individual Time: 7680-8811 OT Individual Time Calculation (min): 56 min    Short Term Goals: Week 4:  OT Short Term Goal 1 (Week 4): Stg=LTG 2/2 ELOS  Skilled Therapeutic Interventions/Progress Updates:    Session 1 OT treatment session focused on fine motor home program. Provided pt with handouts depicting exercises using thera-putty, as well as fine motor activities. Pt used pink medium grade putty. He was able to read directions and follow exercises with min demonstration from OT. Focus on lateral pinch, flat pinch, thumb abduction, individual finger strength, and whole hand strengthening. Pt left seated in recliner with needs met.   Session 2 Pt greeted in recliner finishing up lunch with supervision and occasional cues for head turn. Pt declined bathing/dressing session but agreeable to put on clean shirt. Shirt donned with set-up A. Pt then ambulated room distance with RW, with min A and 2 lateral LOB to L requiring Mod A to correct. B UE strength/coordination with wc propulsion to therapy gym. Pt able to set wc up for squat-pivot transfer with increased time and supervision. Wedge placed under pt to promote anterior pelvic tilt while working on fine motor task of threading nuts and bolts. Pt then propelled wc to therapy kitchen an OT reviewed kitchen access from wc level including wc positioning to get items from fridge, counter, and lower cabinets. Pt completed 1 sit<>stand from wc with education provided for safe positioning. Pt then able to access upper cabinets. Pt propelled wc back to room and was left in wc with safety belt on and needs met.   Therapy Documentation Precautions:  Precautions Precautions: Fall Precaution Comments: new L  hemiparesis; recent CVA with R sided weakness Restrictions Weight Bearing Restrictions: No Pain:  none/denies pain ADL: ADL ADL Comments: Please see functional navigator  See Function Navigator for Current Functional Status.   Therapy/Group: Individual Therapy  [name] 05/10/2017, 2:03 PM

## 2017-05-10 NOTE — Progress Notes (Signed)
Speech Language Pathology Daily Session Note  Patient Details  Name: Brendan Charles MRN: [id number] Date of Birth: 05/23/1956  Today's Date: 05/10/2017 SLP Individual Time: 0730-0825 SLP Individual Time Calculation (min): 55 min  Short Term Goals: Week 4: SLP Short Term Goal 1 (Week 4): Patient will perform pharyngeal strengthening exercises with supervision verbal cues for accuracy.  SLP Short Term Goal 2 (Week 4): Patient will perform 25 repetitions of EMST/IMST excerises with Min A verbal cues for accuracy and a self-percieved effort level of 8/10.  SLP Short Term Goal 3 (Week 4): Patient will utilize speech intelligibility strategies to maximize speech intelligibility at the phrase level to 90% with supervision verbal cues. SLP Short Term Goal 4 (Week 4): Patient will demonstrate efficent mastication of Dys. 2 textures without overt s/s of aspiration with Min A verbal cues over 2 sessions prior to upgrade.   Skilled Therapeutic Interventions: Skilled treatment session focused on speech and dysphagia goals. SLP facilitated session by providing Min-Mod A verbal cues for use of swallowing compensatory strategies during his breakfast meal of Dys. 2 textures with nectar-thick liquids. Patient consumed meal with overt cough X 1, suspect due large amount of pepper on eggs per patient report. Patient also required supervision verbal cues for use of speech intelligibility strategies at the phrase and sentence level to achieve ~90% intelligibility. Patient left upright in wheelchair with all needs within reach. Continue with current plan of care.      Function:  Eating Eating   Modified Consistency Diet: Yes Eating Assist Level: Set up assist for;Supervision or verbal cues   Eating Set Up Assist For: Opening containers       Cognition Comprehension Comprehension assist level: Follows basic conversation/direction with no assist  Expression   Expression assist level: Expresses basic 90% of  the time/requires cueing < 10% of the time.  Social Interaction Social Interaction assist level: Interacts appropriately with others with medication or extra time (anti-anxiety, antidepressant).  Problem Solving Problem solving assist level: Solves basic 90% of the time/requires cueing < 10% of the time  Memory Memory assist level: More than reasonable amount of time    Pain Pain Assessment Pain Assessment: No/denies pain Pain Score: 0-No pain  Therapy/Group: Individual Therapy  Temesgen Weightman 05/10/2017, 11:00 AM

## 2017-05-10 NOTE — Progress Notes (Signed)
Subjective/Complaints:  Pt stated urine looked brown today  but now is green, no burning with urination or freq  ROS: pt denies nausea, vomiting, diarrhea, cough, shortness of breath or chest pain   Objective: Vital Signs: Blood pressure 104/62, pulse 76, temperature (!) 97.2 F (36.2 C), temperature source Oral, resp. rate 18, height '5\' 11"'$  (1.803 m), weight 71.5 kg (157 lb 11.1 oz), SpO2 97 %. Dg Swallowing Func-speech Pathology  Result Date: 05/08/2017 Objective Swallowing Evaluation: Type of Study: MBS-Modified Barium Swallow Study Patient Details Name: Brendan Charles MRN: [id number] Date of Birth: [birth year] Today's Date: 05/08/2017 Time: SLP Start Time (ACUTE ONLY): 0910-SLP Stop Time (ACUTE ONLY): 0950 SLP Time Calculation (min) (ACUTE ONLY): 40 min Past Medical History: Past Medical History: Diagnosis Date . Anxiety  . Chronic back pain  . CVA (cerebral vascular accident) ([place]) 03/20/2017 . Hepatitis C   HEP C, never treated, 2007 Past Surgical History: Past Surgical History: Procedure Laterality Date . CERVICAL DISC SURGERY  2001  anterior . COLONOSCOPY WITH PROPOFOL N/A 03/14/2017  Procedure: COLONOSCOPY WITH PROPOFOL;  Surgeon: Danie Binder, MD;  Location: [facility];  Service: Endoscopy;  Laterality: N/A;  8:15am . CYSTOSCOPY W/ URETERAL STENT PLACEMENT Left 03/23/2017  Procedure: CYSTOSCOPY WITH LEFT RETROGRADE PYELOGRAM/URETERAL LEFT STENT PLACEMENT;  Surgeon: Cleon Gustin, MD;  Location: [facility];  Service: Urology;  Laterality: Left; . LUMBAR SPINE SURGERY  2015 . POLYPECTOMY  03/14/2017  Procedure: POLYPECTOMY;  Surgeon: Danie Binder, MD;  Location: [facility];  Service: Endoscopy;;  rectum HPI: See H&P  Subjective: "I had a hard time chewing today." Assessment / Plan / Recommendation CHL IP CLINICAL IMPRESSIONS 05/08/2017 Clinical Impression Patient demonstrates a moderate sensory-motor oropharyngeal dysphagia that appears improved from previous MBS. Oral phase is  characterized by poor oral control and bolus cohesion leading to premature spillage of all consistencies.  Patient's pharyngeal phase is characterized by delayed swallow initiation with decreased base of tongue retraction resulting in intermittent silent penetration and aspiration of thin liquids via tsp and cup. However, amount of aspirates and episodes of aspiration have decreased since previous MBS. Although patient continues to demonstrate increased cough strength, it is not strong enough to expectorate aspirates. A variety of compensatory strategies and postural changes were attempted throughout the study with intermittent success. Suspect due to impaired timing/coordination. However, a head turn to the right was consistently effective in reducing and almost eliminating moderate vallecular residue with solid textures. Recommend patient upgrade to Dys. 2 textures with use of a head turn to the right and continue nectar-thick liquids. Prognosis for solids upgrade is good as patient's habitation of the strategy improves. Also recommend patient continue RMT, pharyngeal strengthening exercises and water protocol. Discussed results of MBS with the patient and he verbalized understanding and agreement. SLP Visit Diagnosis Dysphagia, oropharyngeal phase (R13.12) Attention and concentration deficit following -- Frontal lobe and executive function deficit following -- Impact on safety and function Moderate aspiration risk;Severe aspiration risk   CHL IP TREATMENT RECOMMENDATION 05/08/2017 Treatment Recommendations Therapy as outlined in treatment plan below   Prognosis 05/08/2017 Prognosis for Safe Diet Advancement Good Barriers to Reach Goals Severity of deficits Barriers/Prognosis Comment -- CHL IP DIET RECOMMENDATION 05/08/2017 SLP Diet Recommendations Free water protocol after oral care;Nectar thick liquid;Dysphagia 2 (Fine chop) solids Liquid Administration via Cup Medication Administration Crushed with puree  Compensations Slow rate;Small sips/bites;Minimize environmental distractions;Multiple dry swallows after each bite/sip;Clear throat intermittently;Other (Comment) Postural Changes Seated upright at 90 degrees  CHL IP OTHER RECOMMENDATIONS 05/08/2017 Recommended Consults -- Oral Care Recommendations Oral care BID Other Recommendations Order thickener from pharmacy;Have oral suction available;Prohibited food (jello, ice cream, thin soups);Remove water pitcher   CHL IP FOLLOW UP RECOMMENDATIONS 05/08/2017 Follow up Recommendations Inpatient Rehab   CHL IP FREQUENCY AND DURATION 05/08/2017 Speech Therapy Frequency (ACUTE ONLY) min 5x/week Treatment Duration 1 week      CHL IP ORAL PHASE 05/08/2017 Oral Phase Impaired Oral - Pudding Teaspoon -- Oral - Pudding Cup -- Oral - Honey Teaspoon NT Oral - Honey Cup NT Oral - Nectar Teaspoon NT Oral - Nectar Cup Delayed oral transit;Premature spillage;Decreased bolus cohesion Oral - Nectar Straw -- Oral - Thin Teaspoon Premature spillage;Delayed oral transit;Decreased bolus cohesion Oral - Thin Cup Premature spillage;Decreased bolus cohesion;Delayed oral transit Oral - Thin Straw -- Oral - Puree Premature spillage;Decreased bolus cohesion;Delayed oral transit Oral - Mech Soft Decreased bolus cohesion;Delayed oral transit;Premature spillage Oral - Regular -- Oral - Multi-Consistency -- Oral - Pill -- Oral Phase - Comment --  CHL IP PHARYNGEAL PHASE 05/08/2017 Pharyngeal Phase Impaired Pharyngeal- Pudding Teaspoon -- Pharyngeal -- Pharyngeal- Pudding Cup -- Pharyngeal -- Pharyngeal- Honey Teaspoon NT Pharyngeal -- Pharyngeal- Honey Cup -- Pharyngeal -- Pharyngeal- Nectar Teaspoon NT Pharyngeal -- Pharyngeal- Nectar Cup Reduced epiglottic inversion;Penetration/Aspiration during swallow;Reduced laryngeal elevation;Reduced anterior laryngeal mobility;Reduced tongue base retraction;Delayed swallow initiation-vallecula Pharyngeal Material enters airway, CONTACTS cords and not ejected out  Pharyngeal- Nectar Straw -- Pharyngeal -- Pharyngeal- Thin Teaspoon Delayed swallow initiation-pyriform sinuses;Reduced laryngeal elevation;Reduced anterior laryngeal mobility;Reduced epiglottic inversion;Reduced tongue base retraction;Penetration/Aspiration during swallow Pharyngeal Material enters airway, remains ABOVE vocal cords and not ejected out Pharyngeal- Thin Cup Delayed swallow initiation-pyriform sinuses;Reduced laryngeal elevation;Reduced anterior laryngeal mobility;Reduced epiglottic inversion;Reduced tongue base retraction;Penetration/Aspiration during swallow Pharyngeal Material enters airway, remains ABOVE vocal cords and not ejected out;Material enters airway, passes BELOW cords and not ejected out despite cough attempt by patient;Material enters airway, passes BELOW cords without attempt by patient to eject out (silent aspiration) Pharyngeal- Thin Straw -- Pharyngeal -- Pharyngeal- Puree Delayed swallow initiation-vallecula;Pharyngeal residue - valleculae;Reduced tongue base retraction;Reduced epiglottic inversion Pharyngeal -- Pharyngeal- Mechanical Soft Delayed swallow initiation-vallecula;Pharyngeal residue - valleculae Pharyngeal -- Pharyngeal- Regular -- Pharyngeal -- Pharyngeal- Multi-consistency -- Pharyngeal -- Pharyngeal- Pill -- Pharyngeal -- Pharyngeal Comment --  CHL IP CERVICAL ESOPHAGEAL PHASE 04/14/2017 Cervical Esophageal Phase Impaired Pudding Teaspoon -- Pudding Cup -- Honey Teaspoon -- Honey Cup -- Nectar Teaspoon -- Nectar Cup -- Nectar Straw -- Thin Teaspoon -- Thin Cup -- Thin Straw -- Puree -- Mechanical Soft -- Regular -- Multi-consistency -- Pill -- Cervical Esophageal Comment -- Feliberto Gottron, MA, CCC-SLP [phone] No flowsheet data found. PAYNE, COURTNEY 05/08/2017, 3:03 PM              No results found for this or any previous visit (from the past 72 hour(s)).   HEENT: normal and dysarthric Cardio: RRR without murmur. No JVD     Resp: CTA Bilaterally without wheezes  or rales. Normal effort     GI: BS positive and NT, ND Extremity:  Pulses positive and No Edema Skin:   Intact Neuro: Alert/Oriented, Cranial Nerve Abnormalities Left central VII, Normal Sensory, Abnormal Motor 3- Left delt, Bi, tri, finger flexor/ext, hip/knee extensor synergy, 2-/5 at ankle, 4/5 in RUE and RLE ---motor exam stable. No nystagmus Musc/Skel:  Other no pain with UE or LE ROM Gen NAD   Assessment/Plan: 1. Functional deficits secondary to acute left hemiparesis, subacute right hemiparesis which require 3+ hours  per day of interdisciplinary therapy in a comprehensive inpatient rehab setting. Physiatrist is providing close team supervision and 24 hour management of active medical problems listed below. Physiatrist and rehab team continue to assess barriers to discharge/monitor patient progress toward functional and medical goals. FIM: Function - Bathing Position: Shower Body parts bathed by patient: Right arm, Left arm, Chest, Abdomen, Front perineal area, Buttocks, Right upper leg, Left upper leg, Right lower leg, Left lower leg Body parts bathed by helper: Back Assist Level: Supervision or verbal cues Assistive Device Comment: long handled sponge  Function- Upper Body Dressing/Undressing What is the patient wearing?: Pull over shirt/dress Pull over shirt/dress - Perfomed by patient: Thread/unthread left sleeve, Put head through opening, Thread/unthread right sleeve, Pull shirt over trunk Pull over shirt/dress - Perfomed by helper: Put head through opening, Pull shirt over trunk Assist Level: More than reasonable time Set up : To obtain clothing/put away Function - Lower Body Dressing/Undressing What is the patient wearing?: Pants, AFO, Shoes, Ted Hose Position: Wheelchair/chair at [employer]- Performed by patient: Thread/unthread right pants leg, Thread/unthread left pants leg, Pull pants up/down Pants- Performed by helper: Pull pants up/down Non-skid slipper socks-  Performed by patient: Don/doff left sock, Don/doff right sock Non-skid slipper socks- Performed by helper: Don/doff left sock Socks - Performed by patient: Don/doff right sock, Don/doff left sock Shoes - Performed by patient: Fasten right, Don/doff right shoe, Don/doff left shoe, Fasten left Shoes - Performed by helper: Don/doff right shoe, Don/doff left shoe, Fasten right, Fasten left AFO - Performed by patient: Don/doff right AFO, Don/doff left AFO AFO - Performed by helper: Don/doff left AFO, Don/doff right AFO TED Hose - Performed by helper: Don/doff right TED hose, Don/doff left TED hose Assist for footwear: Setup Assist for lower body dressing: Supervision or verbal cues Assistive Device Comment: sock-aid  Function - Toileting Toileting activity did not occur: N/A Toileting steps completed by patient: Adjust clothing after toileting, Performs perineal hygiene Toileting steps completed by helper: Adjust clothing prior to toileting Toileting Assistive Devices: Grab bar or rail Assist level: Set up/obtain supplies, Touching or steadying assistance (Pt.75%)  Function - Air cabin crew transfer assistive device: Grab bar, Elevated toilet seat/BSC over toilet, Walker Assist level to toilet: Touching or steadying assistance (Pt > 75%) Assist level from toilet: Touching or steadying assistance (Pt > 75%)  Function - Chair/bed transfer Chair/bed transfer method: Squat pivot Chair/bed transfer assist level: Touching or steadying assistance (Pt > 75%) Chair/bed transfer assistive device: Armrests Chair/bed transfer details: Verbal cues for sequencing  Function - Locomotion: Wheelchair Will patient use wheelchair at discharge?: Yes Type: Manual Max wheelchair distance: 200' Assist Level: Supervision or verbal cues Assist Level: Supervision or verbal cues Wheel 150 feet activity did not occur: Safety/medical concerns Assist Level: Supervision or verbal cues Turns  around,maneuvers to table,bed, and toilet,negotiates 3% grade,maneuvers on rugs and over doorsills: No Function - Locomotion: Ambulation Assistive device: Walker-rolling, Orthosis Max distance: 28' Assist level: Touching or steadying assistance (Pt > 75%) Walk 10 feet activity did not occur: Safety/medical concerns Assist level: Touching or steadying assistance (Pt > 75%) Walk 50 feet with 2 turns activity did not occur: Safety/medical concerns Assist level: Touching or steadying assistance (Pt > 75%) Walk 150 feet activity did not occur: Safety/medical concerns Walk 10 feet on uneven surfaces activity did not occur: Safety/medical concerns  Function - Comprehension Comprehension: Auditory Comprehension assist level: Follows basic conversation/direction with no assist  Function - Expression Expression: Verbal Expression assist level: Expresses  basic 50 - 74% of the time/requires cueing 25 - 49% of the time. Needs to repeat parts of sentences.  Function - Social Interaction Social Interaction assist level: Interacts appropriately with others with medication or extra time (anti-anxiety, antidepressant).  Function - Problem Solving Problem solving assist level: Solves basic 90% of the time/requires cueing < 10% of the time  Function - Memory Memory assist level: More than reasonable amount of time Patient normally able to recall (first 3 days only): Current season, That he or she is in a hospital, Staff names and faces  Medical Problem List and Plan: 1. Right hemiparesis as well as left side weakness/dysarthriasecondary to acute lacunar infarction involving the right medulla oblongata and medial aspect. Subacute large vessel left MCA infarct due to intracranial stenosis-    - CIR PT OT SLP- Team conference today please see physician documentation under team conference tab, met with team face-to-face to discuss problems,progress, and goals. Formulized individual treatment plan based on  medical history, underlying problem and comorbidities.      -continue to work on vestibular exercises provided to him 2. DVT Prophylaxis/Anticoagulation: Subcutaneous Lovenox. Monitor platelet counts of any signs of bleeding 3. Pain Management/chronic back pain: Oxycodone as needed 4. Mood: Xanax 0.5 mg takes qhs- will not be a d/c med  Will d/c on Mirtazapine 5. Neuropsych: This patient iscapable of making decisions on hisown behalf. 6. Skin/Wound Care: Routine skin checks 7. Fluids/Electrolytes/Nutrition: Routine I&O's with follow-up chemistries 8.Hyperlipidemia. Lipitor 9.Tobacco abuse counseling 10.Recent left ureteral stone. Has hematuria potentiated by anticoagulants, last Hgb is 14.8 on 9/79/14 13.7  Continue Flomax. Follow-up urology services, has stent placement, scheduled for removal which will be delayed by CVA, uro f/u as OP 11.Hypertension. Norvasc 2.5 mg daily. Running on low side 10/3, D/C norvasc    Vitals:   05/09/17 1359 05/10/17 0500  BP: 105/79 104/62  Pulse: 87 76  Resp: 20 18  Temp: 97.6 F (36.4 C) (!) 97.2 F (36.2 C)  SpO2: 100% 97%   13.  Tachycardia- improved, volume status looks normal, EKG show sinus rhythm- will monitor   -increased metoprolol to '25mg'$  bid with rate in 70-80s, BPs also running lower as a result 14.  Insomnia - remeron, His complaints are inconsistent. May need some relaxation strategies 15.stools remain formed, abx completed 16.  Bladder incont with freq at noc no dysuria, likely spastic bladder  17.  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  UTI with bacteremia: completed abx, the urine color is dark with sediment , will recheck UA C and S                       19.  Spastic bladder using urinal at bedside    LOS (Days) 27 A FACE TO FACE EVALUATION WAS PERFORMED  KIRSTEINS,ANDREW E 05/10/2017, 7:41 AM

## 2017-05-10 NOTE — Progress Notes (Signed)
Social Work Patient ID: Brendan Charles, male   DOB: 07-05-1956, 61 y.o.   MRN: [id number]  Met with pt to discuss team conference progress toward his goals, son was here to be trained. Will need to come back for speech therapy. Have ordered the equipment and follow up therapies. Work toward discharge Friday.

## 2017-05-10 NOTE — Patient Care Conference (Signed)
Inpatient RehabilitationTeam Conference and Plan of Care Update Date: 05/10/2017   Time: 10:45 AM    Patient Name: Brendan Charles      Medical Record Number: [id number]  Date of Birth: 05/11/1956 Sex: Male         Room/Bed: 4M07C/4M07C-01 Payor Info: Payor: [employer] / Plan: [employer] MEDICARE / Product Type: *No Product type* /    Admitting Diagnosis: cva  Admit Date/Time:  04/13/2017  3:08 PM Admission Comments: No comment available   Primary Diagnosis:  Spastic hemiparesis of left dominant side as late effect of cerebral infarction [facility]) Principal Problem: Spastic hemiparesis of left dominant side as late effect of cerebral infarction [facility])  Patient Active Problem List   Diagnosis Date Noted  . Spastic hemiparesis of left dominant side as late effect of cerebral infarction (HCC) 04/19/2017  . Gait disturbance, post-stroke 04/18/2017  . Adjustment disorder with depressed mood   . Cerebral infarction due to thrombosis of right middle cerebral artery (HCC) 04/13/2017  . Weakness   . Acute ischemic stroke (HCC) 04/12/2017  . Stroke (cerebrum) (HCC) 04/12/2017  . Acute CVA (cerebrovascular accident) ([facility]) 04/12/2017  . CVA (cerebral vascular accident) ([facility]) 03/20/2017  . Hydronephrosis, left 03/20/2017  . Aortic atherosclerosis (HCC) 03/20/2017  . Hepatitis C 02/14/2017  . Encounter for screening colonoscopy 02/14/2017  . Spondylolisthesis at L3-L4 level 06/03/2014    Expected Discharge Date: Expected Discharge Date: 05/12/17  Team Members Present: Physician leading conference: Dr. Claudette Laws Social Worker Present: Dossie Der, LCSW Nurse Present: Ronny Bacon, RN PT Present: Teodoro Kil, PT SLP Present: Feliberto Gottron, SLP PPS Coordinator present : Tora Duck, RN, CRRN     Current Status/Progress Goal Weekly Team Focus  Medical   finished Abx for UTI and bacteremia, urine dark, repeat UA pending, BMET pending  reduce aspiration risk  D/C  planning , work on cough strength   Bowel/Bladder   Incontinent of bladder & bowel at times, LBM 05/09/17  less episodes of incontinence  continue to monitor for pattern of bowel & encorage to use urinal   Swallow/Nutrition/ Hydration   Dys. 2 textures with nectar-thick liquids, water protocol, Min A   Mod I  increased use of strategies, water protocol, RMT, pharyngeal strengthening exercises    ADL's   supervision overall-intermittent min gaurd A  Supervision overall  dc planning, pt/family education, standing balance, modified bathing/dressing   Mobility   close supervision for transfers, min for gait with RW up to 40', trunk control improving, supervision for static and dynamic sitting balance  supervision for transfers and w/c mobility, min for gait/stairs  family education completed, d/c planning, activity tolerance, balance   Communication   Min-Mod A   Min A  speech intelligibility strategies, RMT    Safety/Cognition/ Behavioral Observations            Pain   no c/o pain, has tylenol & percocet ordered prn  pain scale <3      Skin   no areas of skin breakdown noted, slight redness to scrotum & groin, microgard being applied  no new skin break down  assess q shift      *See Care Plan and progress notes for long and short-term goals.     Barriers to Discharge  Current Status/Progress Possible Resolutions Date Resolved   Physician    Medical stability;Home environment access/layout  caregiver training in progress, respiratory strengthening  progress toward goals  see above      Nursing  Incontinence;Medication  compliance               PT                    OT                  SLP                SW                Discharge Planning/Teaching Needs:  Son has been in and educated regarding pt's care needs. Aware pt's need for 24 hr supervision at DC.   Son in form family training on Monday   Team Discussion:  Progressing toward his discharge goals of supervision-min  assist. MD checking UA due to discolored urine. MBS on Monday home on Dys 2, nectar thick liquids-water protocol. Checking BMET tomorrow to make sure hydrating himself on the diet he is on. Con now occasionally at night incontinent. Ready to go home  Revisions to Treatment Plan:  DC 10/5    Continued Need for Acute Rehabilitation Level of Care: The patient requires daily medical management by a physician with specialized training in physical medicine and rehabilitation for the following conditions: Daily direction of a multidisciplinary physical rehabilitation program to ensure safe treatment while eliciting the highest outcome that is of practical value to the patient.: Yes Daily medical management of patient stability for increased activity during participation in an intensive rehabilitation regime.: Yes Daily analysis of laboratory values and/or radiology reports with any subsequent need for medication adjustment of medical intervention for : Neurological problems;Mood/behavior problems  Lucy Chris 05/10/2017, 3:38 PM

## 2017-05-11 ENCOUNTER — Inpatient Hospital Stay (HOSPITAL_COMMUNITY): Payer: Self-pay | Admitting: Physical Therapy

## 2017-05-11 ENCOUNTER — Inpatient Hospital Stay (HOSPITAL_COMMUNITY): Payer: Medicare Other | Admitting: Occupational Therapy

## 2017-05-11 ENCOUNTER — Inpatient Hospital Stay (HOSPITAL_COMMUNITY): Payer: Medicare Other | Admitting: Speech Pathology

## 2017-05-11 LAB — URINE CULTURE: CULTURE: NO GROWTH

## 2017-05-11 MED ORDER — CEPHALEXIN 250 MG PO CAPS
250.0000 mg | ORAL_CAPSULE | Freq: Three times a day (TID) | ORAL | Status: DC
  Administered 2017-05-11 – 2017-05-12 (×4): 250 mg via ORAL
  Filled 2017-05-11 (×4): qty 1

## 2017-05-11 NOTE — Discharge Summary (Signed)
Brendan Charles, BEARDEN NO.:  [id number]  MEDICAL RECORD NO.:  [id number]  LOCATION:  4M07C                        FACILITY:  [facility]  PHYSICIAN:  Erick Colace, M.D.DATE OF BIRTH:  22-Jul-1956  DATE OF ADMISSION:  04/13/2017 DATE OF DISCHARGE:  05/12/2017                              DISCHARGE SUMMARY   DISCHARGE DIAGNOSES: 1. Acute lacunar infarction involving the right medulla oblongata and     medial aspect with subacute large vessel left middle cerebral     artery infarction due to intracranial stenosis. 2. Subcutaneous Lovenox for DVT prophylaxis. 3. Anxiety. 4. Escherichia coli urinary tract infection. 5. Hyperlipidemia. 6. Tobacco abuse. 7. Recent left ureteral stone. 8. Hypertension.  HISTORY OF PRESENT ILLNESS:  This is a 61 year old right-handed male, history of tobacco abuse, hepatitis C, recent CVA with right-sided weakness due to left MCA infarction, March 20, 2017, was discharged on March 21, 2017, ambulating minimal assist to 100 feet using a rolling walker, discharged on aspirin and Plavix.  He lives with family.  He used a walker prior to admission.  He was receiving home health therapies.  He was doing well until April 12, 2017, with left-sided weakness, numbness, as well as severe dysarthria.  CT MRI showed small acute right medullary infarction.  No associated hemorrhage, evolution of left corona radiata, basal ganglia infarction since March 20, 2017. CT angiogram of head and neck with no dissection or aneurysm.  No large- vessel occlusion.  Recent echocardiogram with ejection fraction of 65% overall motion abnormality.  The patient also with recent left ureteral stone seen by Urology Service, Dr. Wilkie Aye.  Underwent cystoscopy, March 23, 2017, showing moderate hydronephrosis.  No mass or lesions.  Plan was for stone extraction in approximately 4 weeks.  He had been placed on Flomax.  Currently, he remained on Plavix  and aspirin for CVA prophylaxis.  Subcutaneous Lovenox for DVT prophylaxis.  The patient was admitted for comprehensive rehab program.  PAST MEDICAL HISTORY:  See discharge diagnoses.  SOCIAL HISTORY:  He lives with family.  Used a walker prior to admission.  FUNCTIONAL STATUS UPON ADMISSION TO REHAB SERVICES:  Mod-max assist, 10 feet rolling walker; moderate assist stand pivot transfers; mod-max assist activities of daily living.  PHYSICAL EXAMINATION:  VITAL SIGNS:  Blood pressure 160/80, pulse 97, temperature 98, respirations 18. GENERAL:  Alert male, severe dysarthria, followed simple commands.  He was a bit impulsive.  EOMs intact. NECK:  Supple.  Nontender, no JVD. CARDIAC:  Rate controlled. ABDOMEN:  Soft, nontender.  Good bowel sounds. LUNGS:  Clear to auscultation without wheeze.  REHABILITATION HOSPITAL COURSE:  The patient was admitted to Inpatient Rehab Services.  Therapy is initiated on a 3-hour daily basis, consisting of physical therapy, occupational therapy, speech therapy, and rehabilitation nursing.  The following issues were addressed during the patient's rehabilitation stay.  Pertaining to Mr. Klahn, acute lacunar infarction, history of left MCA infarction.  He remained on aspirin and Plavix therapy.  He would follow up with Neurology Services. Therapies were working with vestibular exercises provided to him. Subcutaneous Lovenox for DVT prophylaxis.  Pain management use of oxycodone as needed.  He did have a history of  chronic back pain.  Xanax at bedtime during his hospital stay, not used at the time of discharge. He had been placed on Remeron with good results.  Tobacco abuse.  Received full counseling regard to cessation of nicotine products.  Blood pressure is controlled on Lopressor.  He remained on Lipitor for hyperlipidemia.  He was treated for an E coli UTI during his rehab stay with noted recent history of a left ureteral stone with some noted  hematuria, felt to be potentiated by anticoagulants.  His hemoglobin and hematocrit remained stable.  He would continue on Flomax and follow up outpatient with Urology Services, Dr. Cleda Mccreedy. The patient received weekly collaborative interdisciplinary team conferences to discuss estimated length of stay, family teaching, any barriers to discharge.  The patient performed transfers throughout sessions with minimal assist using squat pivot transfers, ambulating 40 feet x2, minimal assist rolling walker with rest breaks.  Performed wheelchair self-propulsion supervision, needing some assistance for activities of daily living and homemaking.  He declined bathing and dressing sessions at times.  He could put his shirt on with simple set up, ambulate in his room with a rolling walker.  Able to a set wheelchair up for squat pivot transfers with some increased time and supervision.  He was able to communicate his needs and plan was discharge to home.  DISCHARGE MEDICATIONS: 1. Aspirin 325 mg p.o. daily. 2. Lipitor 80 mg p.o. daily. 3. Plavix 75 mg p.o. daily. 4. Lopressor 25 mg p.o. b.i.d. 5. Remeron 15 mg p.o. at bedtime. 6. Flomax 0.4 mg daily. 7. Oxycodone 1 tablet every 6 hours as needed pain, dispense of 20     tablets.  DIET: 1. His diet was dysphagia. 2. Nectar thick liquids.  FOLLOWUP:  The patient would follow up with Dr. Claudette Laws at the Outpatient Rehab Service office as advised.  [facility] 6 weeks call for appointment; Dr. Elfredia Nevins, Medical Management; and Dr. Cleda Mccreedy, Urology Services for consideration of extraction of ureteral stone.     Mariam Dollar, P.A.   ______________________________ Erick Colace, M.D.    DA/MEDQ  D:  05/11/2017  T:  05/11/2017  Job:  [id number]  cc:   Erick Colace, M.D. Madelin Rear. Sherwood Gambler, MD

## 2017-05-11 NOTE — Discharge Summary (Signed)
Discharge summary job [phone]

## 2017-05-11 NOTE — Progress Notes (Signed)
Speech Language Pathology Daily Session Note  Patient Details  Name: [name] MRN: [id number] Date of Birth: [birth year]  Today's Date: 05/11/2017 SLP Individual Time: 1030-1130 SLP Individual Time Calculation (min): 60 min  Short Term Goals: Week 4: SLP Short Term Goal 1 (Week 4): Patient will perform pharyngeal strengthening exercises with supervision verbal cues for accuracy.  SLP Short Term Goal 2 (Week 4): Patient will perform 25 repetitions of EMST/IMST excerises with Min A verbal cues for accuracy and a self-percieved effort level of 8/10.  SLP Short Term Goal 3 (Week 4): Patient will utilize speech intelligibility strategies to maximize speech intelligibility at the phrase level to 90% with supervision verbal cues. SLP Short Term Goal 4 (Week 4): Patient will demonstrate efficent mastication of Dys. 2 textures without overt s/s of aspiration with Min A verbal cues over 2 sessions prior to upgrade.   Skilled Therapeutic Interventions: Skilled treatment session focused on dysphagia and speech goals. Patient performed 25 repetitions of EMST/IMST with extra time and supervision verbal cues for accuracy with an 8/10 self-percieved effort level. Patient also consumed trials of thin liquids via cup without overt s/s of aspiration in 25% of trials. Recommend patient continue nectar-thick liquids. SLP also facilitated session by providing education in regards to his current swallowing function, diet recommendations, appropriate textures, swallowing compensatory strategies, medication administration, RMT, pharyngeal strengthening exercises and the water protocol. He verbalized understanding and handouts were also given to reinforce information. Patient also appropriately thickened liquids with supervision verbal cues. Patient transferred back to bed at end of session per his request. Continue with current plan of care.      Function:  Eating Eating   Modified Consistency Diet: Yes Eating  Assist Level: Set up assist for;Supervision or verbal cues   Eating Set Up Assist For: Opening containers       Cognition Comprehension Comprehension assist level: Follows basic conversation/direction with no assist  Expression   Expression assist level: Expresses basic 90% of the time/requires cueing < 10% of the time.  Social Interaction Social Interaction assist level: Interacts appropriately with others with medication or extra time (anti-anxiety, antidepressant).  Problem Solving Problem solving assist level: Solves basic 90% of the time/requires cueing < 10% of the time  Memory Memory assist level: More than reasonable amount of time    Pain No/Denies Pain   Therapy/Group: Individual Therapy  Aleister Lady 05/11/2017, 3:35 PM

## 2017-05-11 NOTE — Progress Notes (Signed)
Subjective/Complaints:  Urine darker in afternoon, no dysuria or freq  ROS: pt denies nausea, vomiting, diarrhea, cough, shortness of breath or chest pain   Objective: Vital Signs: Blood pressure 107/78, pulse 87, temperature 97.7 F (36.5 C), temperature source Oral, resp. rate 18, height '5\' 11"'$  (1.803 m), weight 71.5 kg (157 lb 11.1 oz), SpO2 98 %. No results found. Results for orders placed or performed during the hospital encounter of 04/13/17 (from the past 72 hour(s))  Basic metabolic panel     Status: Abnormal   Collection Time: 05/10/17  8:48 AM  Result Value Ref Range   Sodium 141 135 - 145 mmol/L   Potassium 3.8 3.5 - 5.1 mmol/L   Chloride 106 101 - 111 mmol/L   CO2 28 22 - 32 mmol/L   Glucose, Bld 125 (H) 65 - 99 mg/dL   BUN 15 6 - 20 mg/dL   Creatinine, Ser 0.81 0.61 - 1.24 mg/dL   Calcium 9.2 8.9 - 10.3 mg/dL   GFR calc non Af Amer >60 >60 mL/min   GFR calc Af Amer >60 >60 mL/min    Comment: (NOTE) The eGFR has been calculated using the CKD EPI equation. This calculation has not been validated in all clinical situations. eGFR's persistently <60 mL/min signify possible Chronic Kidney Disease.    Anion gap 7 5 - 15  Urinalysis, Routine w reflex microscopic     Status: Abnormal   Collection Time: 05/10/17  2:30 PM  Result Value Ref Range   Color, Urine RED (A) YELLOW    Comment: BIOCHEMICALS MAY BE AFFECTED BY COLOR   APPearance TURBID (A) CLEAR   Specific Gravity, Urine  1.005 - 1.030    TEST NOT REPORTED DUE TO COLOR INTERFERENCE OF URINE PIGMENT   pH  5.0 - 8.0    TEST NOT REPORTED DUE TO COLOR INTERFERENCE OF URINE PIGMENT   Glucose, UA (A) NEGATIVE mg/dL    TEST NOT REPORTED DUE TO COLOR INTERFERENCE OF URINE PIGMENT   Hgb urine dipstick (A) NEGATIVE    TEST NOT REPORTED DUE TO COLOR INTERFERENCE OF URINE PIGMENT   Bilirubin Urine (A) NEGATIVE    TEST NOT REPORTED DUE TO COLOR INTERFERENCE OF URINE PIGMENT   Ketones, ur (A) NEGATIVE mg/dL    TEST NOT  REPORTED DUE TO COLOR INTERFERENCE OF URINE PIGMENT   Protein, ur (A) NEGATIVE mg/dL    TEST NOT REPORTED DUE TO COLOR INTERFERENCE OF URINE PIGMENT   Nitrite (A) NEGATIVE    TEST NOT REPORTED DUE TO COLOR INTERFERENCE OF URINE PIGMENT   Leukocytes, UA (A) NEGATIVE    TEST NOT REPORTED DUE TO COLOR INTERFERENCE OF URINE PIGMENT  Urinalysis, Microscopic (reflex)     Status: Abnormal   Collection Time: 05/10/17  2:30 PM  Result Value Ref Range   RBC / HPF TOO NUMEROUS TO COUNT 0 - 5 RBC/hpf   WBC, UA 6-30 0 - 5 WBC/hpf   Bacteria, UA FEW (A) NONE SEEN   Squamous Epithelial / LPF 0-5 (A) NONE SEEN     HEENT: normal and dysarthric Cardio: RRR without murmur. No JVD     Resp: CTA Bilaterally without wheezes or rales. Normal effort     GI: BS positive and NT, ND Extremity:  Pulses positive and No Edema Skin:   Intact Neuro: Alert/Oriented, Cranial Nerve Abnormalities Left central VII, Normal Sensory, Abnormal Motor 3- Left delt, Bi, tri, finger flexor/ext, hip/knee extensor synergy, 2-/5 at ankle, 4/5 in RUE and RLE ---  motor exam stable. No nystagmus Musc/Skel:  Other no pain with UE or LE ROM Gen NAD   Assessment/Plan: 1. Functional deficits secondary to acute left hemiparesis, subacute right hemiparesis which require 3+ hours per day of interdisciplinary therapy in a comprehensive inpatient rehab setting. Physiatrist is providing close team supervision and 24 hour management of active medical problems listed below. Physiatrist and rehab team continue to assess barriers to discharge/monitor patient progress toward functional and medical goals. FIM: Function - Bathing Position: Shower Body parts bathed by patient: Right arm, Left arm, Chest, Abdomen, Front perineal area, Buttocks, Right upper leg, Left upper leg, Right lower leg, Left lower leg Body parts bathed by helper: Back Assist Level: Supervision or verbal cues Assistive Device Comment: long handled sponge  Function- Upper  Body Dressing/Undressing What is the patient wearing?: Pull over shirt/dress Pull over shirt/dress - Perfomed by patient: Thread/unthread left sleeve, Put head through opening, Thread/unthread right sleeve, Pull shirt over trunk Pull over shirt/dress - Perfomed by helper: Put head through opening, Pull shirt over trunk Assist Level: Set up Set up : To obtain clothing/put away Function - Lower Body Dressing/Undressing What is the patient wearing?: Pants, AFO, Shoes, Ted Hose Position: Wheelchair/chair at [employer]- Performed by patient: Thread/unthread right pants leg, Thread/unthread left pants leg, Pull pants up/down Pants- Performed by helper: Pull pants up/down Non-skid slipper socks- Performed by patient: Don/doff left sock, Don/doff right sock Non-skid slipper socks- Performed by helper: Don/doff left sock Socks - Performed by patient: Don/doff right sock, Don/doff left sock Shoes - Performed by patient: Fasten right, Don/doff right shoe, Don/doff left shoe, Fasten left Shoes - Performed by helper: Don/doff right shoe, Don/doff left shoe, Fasten right, Fasten left AFO - Performed by patient: Don/doff right AFO, Don/doff left AFO AFO - Performed by helper: Don/doff left AFO, Don/doff right AFO TED Hose - Performed by helper: Don/doff right TED hose, Don/doff left TED hose Assist for footwear: Setup Assist for lower body dressing: Supervision or verbal cues Assistive Device Comment: sock-aid  Function - Toileting Toileting activity did not occur: N/A Toileting steps completed by patient: Adjust clothing after toileting, Performs perineal hygiene Toileting steps completed by helper: Adjust clothing prior to toileting Toileting Assistive Devices: Grab bar or rail Assist level: Set up/obtain supplies, Touching or steadying assistance (Pt.75%)  Function - Air cabin crew transfer assistive device: Grab bar, Elevated toilet seat/BSC over toilet, Walker Assist level to toilet:  Touching or steadying assistance (Pt > 75%) Assist level from toilet: Touching or steadying assistance (Pt > 75%)  Function - Chair/bed transfer Chair/bed transfer method: Squat pivot Chair/bed transfer assist level: Touching or steadying assistance (Pt > 75%) Chair/bed transfer assistive device: Armrests Chair/bed transfer details: Verbal cues for sequencing  Function - Locomotion: Wheelchair Will patient use wheelchair at discharge?: Yes Type: Manual Max wheelchair distance: 200' Assist Level: Supervision or verbal cues Assist Level: Supervision or verbal cues Wheel 150 feet activity did not occur: Safety/medical concerns Assist Level: Supervision or verbal cues Turns around,maneuvers to table,bed, and toilet,negotiates 3% grade,maneuvers on rugs and over doorsills: Yes Function - Locomotion: Ambulation Assistive device: Walker-rolling, Orthosis Max distance: 40' Assist level: Touching or steadying assistance (Pt > 75%) Walk 10 feet activity did not occur: Safety/medical concerns Assist level: Touching or steadying assistance (Pt > 75%) Walk 50 feet with 2 turns activity did not occur: Safety/medical concerns Assist level: Touching or steadying assistance (Pt > 75%) Walk 150 feet activity did not occur: Safety/medical concerns Walk 10 feet on  uneven surfaces activity did not occur: Safety/medical concerns  Function - Comprehension Comprehension: Auditory Comprehension assist level: Follows basic conversation/direction with no assist  Function - Expression Expression: Verbal Expression assist level: Expresses basic 90% of the time/requires cueing < 10% of the time.  Function - Social Interaction Social Interaction assist level: Interacts appropriately with others with medication or extra time (anti-anxiety, antidepressant).  Function - Problem Solving Problem solving assist level: Solves basic 90% of the time/requires cueing < 10% of the time  Function - Memory Memory  assist level: More than reasonable amount of time Patient normally able to recall (first 3 days only): Current season, That he or she is in a hospital, Staff names and faces  Medical Problem List and Plan: 1. Right hemiparesis as well as left side weakness/dysarthriasecondary to acute lacunar infarction involving the right medulla oblongata and medial aspect. Subacute large vessel left MCA infarct due to intracranial stenosis-    - CIR PT OT SLP-plan d/c in am   -continue to work on vestibular exercises provided to him 2. DVT Prophylaxis/Anticoagulation: Subcutaneous Lovenox. Monitor platelet counts of any signs of bleeding 3. Pain Management/chronic back pain: Oxycodone as needed 4. Mood: Xanax 0.5 mg takes qhs- will not be a d/c med  Will d/c on Mirtazapine 5. Neuropsych: This patient iscapable of making decisions on hisown behalf. 6. Skin/Wound Care: Routine skin checks 7. Fluids/Electrolytes/Nutrition: Routine I&O's with follow-up chemistries 8.Hyperlipidemia. Lipitor 9.Tobacco abuse counseling 10.Recent left ureteral stone. Has hematuria potentiated by anticoagulants, last Hgb is 14.8 on 9/79/14 13.7  Continue Flomax. Follow-up urology services, has stent placement, scheduled for removal which will be delayed by CVA, uro f/u as OP 11.Hypertension. Norvasc 2.5 mg daily. Running on low side 10/3, D/C norvasc    Vitals:   05/10/17 1512 05/11/17 0408  BP: (!) 96/58 107/78  Pulse: 91 87  Resp: 18 18  Temp: 98 F (36.7 C) 97.7 F (36.5 C)  SpO2: 98% 98%   13.  Tachycardia- improved, volume status looks normal, EKG show sinus rhythm- will monitor   -increased metoprolol to '25mg'$  bid with rate in 70-80s, BPs also running lower as a result 14.  Insomnia - remeron, His complaints are inconsistent. May need some relaxation strategies 15.stools remain formed, abx completed 16.  Bladder incont with freq at noc no dysuria, likely spastic bladder  17.  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  UTI with bacteremia: completed abx, the urine color is dark with sediment ,  recheck UA, 6-30WBC and few bact , C and S  pnd will start keflex pending results given hx of renal stents                     19.  Spastic bladder using urinal at bedside    LOS (Days) 28 A FACE TO FACE EVALUATION WAS PERFORMED  KIRSTEINS,ANDREW E 05/11/2017, 7:37 AM

## 2017-05-11 NOTE — Progress Notes (Signed)
Social Work  Discharge Note  The overall goal for the admission was met for:   Discharge location: [place] [facility]  Length of Stay: Yes-29 DAYS  Discharge activity level: Yes-SUPERVISION-MIN ASSIST LEVEL  Home/community participation: Yes  Services provided included: MD, RD, PT, OT, SLP, RN, CM, TR, Pharmacy, Neuropsych and SW  Financial Services: Private Insurance: [facility]  Follow-up services arranged: Home Health: [place]-PT,OT,SP,RN, DME: [facility] and Patient/Family request agency HH: [facility], DME: NO PREF  Comments (or additional information):SON IN TO GO THROUGH TRAINING AND FEELS COMFORTABLE WITH DAD'S CARE. AWARE PT WILL REQUIRE 24 HR CARE AT DISCHARGE.  Patient/Family verbalized understanding of follow-up arrangements: Yes  Individual responsible for coordination of the follow-up plan: SELF & JOE-SON  Confirmed correct DME delivered: Elease Hashimoto 05/11/2017    Elease Hashimoto

## 2017-05-11 NOTE — Plan of Care (Signed)
Problem: RH Balance Goal: LTG Patient will maintain dynamic standing balance (PT) LTG:  Patient will maintain dynamic standing balance with assistance during mobility activities (PT)  Outcome: Not Met (add Reason) Continues to require min A.   Problem: RH Stairs Goal: LTG Patient will ambulate up and down stairs w/assist (PT) LTG: Patient will ambulate up and down # of stairs with assistance (PT)  Outcome: Adequate for Discharge Pt has completed 4 steps at min A level consistently, requires progressive assist up to max with 12 steps.

## 2017-05-11 NOTE — Progress Notes (Signed)
Physical Therapy Discharge Summary  Patient Details  Name: Brendan Charles MRN: [id number] Date of Birth: [birth year]  Today's Date: 05/11/2017 PT Individual Time: 0900-1000 PT Individual Time Calculation (min): 60 min    Patient has met 8 of 9 long term goals due to improved activity tolerance, improved balance, improved postural control, increased strength, improved attention, improved awareness and improved coordination.  Patient to discharge at a wheelchair level supervision to min A depending on fatigue.   Patient's care partner is independent to provide the necessary physical assistance at discharge.  Reasons goals not met: Patient is consistently able to negotiate 4 steps with 1 rail with min A, however, when attempt to increase stair negotiation to 12 steps, pt demonstrates significant fatigue with need for increased assist.   Recommendation:  Patient will benefit from ongoing skilled PT services in home health setting to continue to advance safe functional mobility, address ongoing impairments in activity tolerance, strength, balance, and minimize fall risk.  Equipment: [id number] wheelchair  Reasons for discharge: treatment goals met  Patient/family agrees with progress made and goals achieved: Yes   Skilled PT Intervention: No c/o pain throughout session; session focused on functional mobility and preparing for discharge.   Pt transferred throughout session squat pivot with supervision. Pt performed sit to stands throughout session with min A using arm rests and walker in standing. Pt performed transfer w/c > car and car > w/c with min A for pivoting as well as wheelchair part management. Pt negotiated corner steps with rest break between passes, requiring min A for the first 4 steps and increasing assistance to max A as patient fatigued. Pt ambulated 90' with RW and min A for guarding and steadying. Pt required frequent rest breaks throughout therapy session due to fatigue. Pt  returned to room, upright in w/c at sink to brush teeth, all needs addressed.    PT Discharge Precautions/Restrictions Precautions Precautions: Fall Precaution Comments: R > L hemiplegia Restrictions Weight Bearing Restrictions: No Pain Pain Assessment Pain Assessment: No/denies pain Vision/Perception  Perception Perception: Within Functional Limits  Cognition Arousal/Alertness: Awake/alert Orientation Level: Oriented X4 Memory: Appears intact Awareness: Appears intact Problem Solving: Appears intact Sensation Sensation Light Touch: Appears Intact Coordination Gross Motor Movements are Fluid and Coordinated: No Coordination and Movement Description: improved since eval Motor  Motor Motor: Hemiplegia;Abnormal postural alignment and control Motor - Skilled Clinical Observations: R > L hemiplegia Motor - Discharge Observations: improved since eval  Mobility Bed Mobility Bed Mobility: Rolling Right;Supine to Sit Rolling Right: 5: Supervision Supine to Sit: 5: Supervision Transfers Transfers: Yes Stand Pivot Transfers: 4: Min assist Squat Pivot Transfers: 5: Supervision Locomotion  Ambulation Ambulation: Yes Ambulation/Gait Assistance: 4: Min guard Ambulation Distance (Feet): 44 Feet Assistive device: Rolling walker Gait Gait: Yes Gait Pattern: Impaired Gait Pattern: Decreased hip/knee flexion - left;Decreased hip/knee flexion - right;Poor foot clearance - right;Decreased step length - right;Decreased step length - left;Step-to pattern Stairs / Additional Locomotion Stairs: Yes Stairs Assistance: 4: Min assist;2: Max assist Stairs Assistance Details: Verbal cues for sequencing;Verbal cues for technique;Verbal cues for precautions/safety;Manual facilitation for placement Stairs Assistance Details (indicate cue type and reason): increase in assistance due to fatigue Stair Management Technique: Two rails Number of Stairs: 12 Wheelchair Mobility Wheelchair Mobility:  Yes Wheelchair Assistance: 5: Careers information officer: Both upper extremities Wheelchair Parts Management: Supervision/cueing Distance: 200'  Trunk/Postural Assessment  Cervical Assessment Cervical Assessment: Within Functional Limits Thoracic Assessment Thoracic Assessment:  (kyphotic) Lumbar Assessment Lumbar Assessment:  (posterior pelvic tilt in  sitting)  Balance Balance Balance Assessed: Yes Static Sitting Balance Static Sitting - Level of Assistance: 5: Stand by assistance Dynamic Sitting Balance Dynamic Sitting - Level of Assistance: 5: Stand by assistance Static Standing Balance Static Standing - Level of Assistance: 4: Min assist Dynamic Standing Balance Dynamic Standing - Level of Assistance: 4: Min assist Extremity Assessment      RLE Assessment RLE Assessment: Within Functional Limits (4/5) LLE Assessment LLE Assessment: Within Functional Limits (4/5)   See Function Navigator for Current Functional Status.  Harriet Masson 05/11/2017, 12:31 PM

## 2017-05-11 NOTE — Plan of Care (Signed)
Problem: RH Balance Goal: LTG Patient will maintain dynamic standing with ADLs (OT) LTG:  Patient will maintain dynamic standing balance with assist during activities of daily living (OT)   Outcome: Not Met (add Reason) Pt needs intermittent Min A for dynamic balance. ESD-

## 2017-05-11 NOTE — Discharge Instructions (Signed)
Inpatient Rehab Discharge Instructions  Brendan Charles Discharge date and time: No discharge date for patient encounter.   Activities/Precautions/ Functional Status: Activity: activity as tolerated Diet: dysphagia #2 nectar liquids Wound Care: none needed Functional status:  ___ No restrictions     ___ Walk up steps independently ___ 24/7 supervision/assistance   ___ Walk up steps with assistance ___ Intermittent supervision/assistance  ___ Bathe/dress independently ___ Walk with walker     _x STROKE/TIA DISCHARGE INSTRUCTIONS SMOKING Cigarette smoking nearly doubles your risk of having a stroke & is the single most alterable risk factor  If you smoke or have smoked in the last 12 months, you are advised to quit smoking for your health.  Most of the excess cardiovascular risk related to smoking disappears within a year of stopping.  Ask you doctor about anti-smoking medications  [place] Quit Line: 1-800-QUIT NOW  Free Smoking Cessation Classes [phone]  CHOLESTEROL Know your levels; limit fat & cholesterol in your diet  Lipid Panel     Component Value Date/Time   CHOL 141 03/21/2017 0509   TRIG 80 03/21/2017 0509   HDL 36 (L) 03/21/2017 0509   CHOLHDL 3.9 03/21/2017 0509   VLDL 16 03/21/2017 0509   LDLCALC 89 03/21/2017 0509      Many patients benefit from treatment even if their cholesterol is at goal.  Goal: Total Cholesterol (CHOL) less than 160  Goal:  Triglycerides (TRIG) less than 150  Goal:  HDL greater than 40  Goal:  LDL (LDLCALC) less than 100   BLOOD PRESSURE American Stroke Association blood pressure target is less that 120/80 mm/Hg  Your discharge blood pressure is:  BP: (!) 96/58  Monitor your blood pressure  Limit your salt and alcohol intake  Many individuals will require more than one medication for high blood pressure  DIABETES (A1c is a blood sugar average for last 3 months) Goal HGBA1c is under 7% (HBGA1c is blood sugar average for last 3  months)  Diabetes: No known diagnosis of diabetes    Lab Results  Component Value Date   HGBA1C 5.5 03/21/2017     Your HGBA1c can be lowered with medications, healthy diet, and exercise.  Check your blood sugar as directed by your physician  Call your physician if you experience unexplained or low blood sugars.  PHYSICAL ACTIVITY/REHABILITATION Goal is 30 minutes at least 4 days per week  Activity: Increase activity slowly, Therapies: Physical Therapy: Home Health Return to work:   Activity decreases your risk of heart attack and stroke and makes your heart stronger.  It helps control your weight and blood pressure; helps you relax and can improve your mood.  Participate in a regular exercise program.  Talk with your doctor about the best form of exercise for you (dancing, walking, swimming, cycling).  DIET/WEIGHT Goal is to maintain a healthy weight  Your discharge diet is: DIET DYS 2 Room service appropriate? Yes; Fluid consistency: Nectar Thick  liquids Your height is:  Height:  (180.3 cm) Your current weight is: Weight: 71.5 kg (157 lb 11.1 oz) Your Body Mass Index (BMI) is:  BMI (Calculated): 22  Following the type of diet specifically designed for you will help prevent another stroke.  Your goal weight range is:    Your goal Body Mass Index (BMI) is 19-24.  Healthy food habits can help reduce 3 risk factors for stroke:  High cholesterol, hypertension, and excess weight.  RESOURCES Stroke/Support Group:  Call [phone]   STROKE  EDUCATION PROVIDED/REVIEWED AND GIVEN TO PATIENT Stroke warning signs and symptoms How to activate emergency medical system (call 911). Medications prescribed at discharge. Need for follow-up after discharge. Personal risk factors for stroke. Pneumonia vaccine given:  Flu vaccine given:  My questions have been answered, the writing is legible, and I understand these instructions.  I will adhere to these goals & educational materials  that have been provided to me after my discharge from the hospital.   __ Bathe/dress with assistance ___ Walk Independently    ___ Shower independently ___ Walk with assistance    ___ Shower with assistance ___ No alcohol     ___ Return to work/school ________  Special Instructions:    COMMUNITY REFERRALS UPON DISCHARGE:    Home Health:   PT, OT, RN, SP     Agency:[facility] Phone: [phone]   Date of last service:05/12/2017  Medical Equipment/Items Ordered:HOSPITAL BED, WHEELCHAIR, DROP-ARM BEDSIDE COMMODE & TUB BENCH  Agency/Supplier:[facility] [phone]   GENERAL COMMUNITY RESOURCES FOR PATIENT/FAMILY: Support Groups:[facility] SUPPORT GROUP EVERY SECOND Thursday @ 3:00-4:00 PM ON THE REHAB UNIT QUESTIONS CONTACT [facility] [phone]   My questions have been answered and I understand these instructions. I will adhere to these goals and the provided educational materials after my discharge from the hospital.  Patient/Caregiver Signature _______________________________ Date __________  Clinician Signature _______________________________________ Date __________  Please bring this form and your medication list with you to all your follow-up doctor's appointments.

## 2017-05-11 NOTE — Progress Notes (Signed)
Occupational Therapy Discharge Summary  Patient Details  Name: Brendan Charles MRN: [id number] Date of Birth: 25-Mar-1956  Today's Date: 05/11/2017 OT Individual Time: 1300-1426 OT Individual Time Calculation (min): 86 min   OT treatment session focused on pt/family education and modified bathing/dressing. Pt's son Wille Glaser present throughout session. Educated Joe on DME needs and home modifications for safe BADL participation at home. Joe assisted pt as needed with stand-pivot transfers wc>tub bench>wc>bed. Pt able to complete all BADLs with supervision and intermittent min A for dynamic balance when reaching to pull up pants. Pt returned to bed at end of session and left with son present and needs met.    Patient has met 10 of 11 long term goals due to improved activity tolerance, improved balance, postural control, ability to compensate for deficits, functional use of  LEFT upper and LEFT lower extremity, improved attention, improved awareness and improved coordination.  Patient to discharge at overall Supervision level.  Patient's care partner is independent to provide the necessary physical assistance at discharge.    Reasons goals not met: Pt needs intermittent Min A for dynamic balance during ADL tasks.  Recommendation:  Patient will benefit from ongoing skilled OT services in home health setting to continue to advance functional skills in the area of BADL and Reduce care partner burden.  Equipment: tub transfer bench, 3-in-1 BSC, wheelchair (see PT note), hospital bed  Reasons for discharge: treatment goals met and discharge from hospital  Patient/family agrees with progress made and goals achieved: Yes  OT Discharge Precautions/Restrictions  Precautions Precautions: Fall Precaution Comments: R > L hemiplegia Restrictions Weight Bearing Restrictions: No Pain  none/denies pain ADL ADL Equipment Provided: Sock aid, Long-handled sponge Eating: Supervision/safety, Set  up Grooming: Modified independent Upper Body Bathing: Supervision/safety Where Assessed-Upper Body Bathing: Shower Lower Body Bathing: Supervision/safety Where Assessed-Lower Body Bathing: Shower Upper Body Dressing: Supervision/safety Lower Body Dressing: Supervision/safety Toileting: Supervision/safety Toilet Transfer: Close supervision Toilet Transfer Method: Squat pivot Tub/Shower Transfer: Close supervison Clinical cytogeneticist Method: Engineer, production: Transfer tub bench ADL Comments: Please see functional navigator Sensation Sensation Light Touch Impaired Details: Impaired LUE;Impaired LLE Hot/Cold: Appears Intact Coordination Gross Motor Movements are Fluid and Coordinated: No Fine Motor Movements are Fluid and Coordinated: No Coordination and Movement Description: improved since eval Motor  Motor Motor: Hemiplegia;Abnormal postural alignment and control Motor - Skilled Clinical Observations: R > L hemiplegia Motor - Discharge Observations: improved since eval Mobility  Bed Mobility Bed Mobility: Rolling Right;Supine to Sit Rolling Right: 5: Supervision Supine to Sit: 5: Supervision  Trunk/Postural Assessment  Cervical Assessment Cervical Assessment: Within Functional Limits Thoracic Assessment Thoracic Assessment:  (kyphotic) Lumbar Assessment Lumbar Assessment:  (posterior pelvic tilt in sitting)  Balance Balance Balance Assessed: Yes Static Sitting Balance Static Sitting - Level of Assistance: 6: Modified independent (Device/Increase time) Dynamic Sitting Balance Dynamic Sitting - Level of Assistance: 6: Modified independent (Device/Increase time) Static Standing Balance Static Standing - Level of Assistance: 5: Stand by assistance;4: Min assist Dynamic Standing Balance Dynamic Standing - Level of Assistance: 5: Stand by assistance;4: Min assist Extremity/Trunk Assessment RUE Assessment RUE Assessment:  (4/5 overall) LUE Assessment LUE  Assessment:  (4-/5 overall)   See Function Navigator for Current Functional Status.  Daneen Schick Alayia Meggison 05/11/2017, 2:38 PM

## 2017-05-12 MED ORDER — RESOURCE THICKENUP CLEAR PO POWD: 3.0000 | ORAL | 1 refills | Status: DC | PRN

## 2017-05-12 MED ORDER — CLOPIDOGREL BISULFATE 75 MG PO TABS: 75.0000 mg | ORAL_TABLET | Freq: Every day | ORAL | 3 refills | Status: DC

## 2017-05-12 MED ORDER — OXYCODONE-ACETAMINOPHEN 7.5-325 MG PO TABS: 1.0000 | ORAL_TABLET | Freq: Four times a day (QID) | ORAL | 0 refills | Status: DC | PRN

## 2017-05-12 MED ORDER — TAMSULOSIN HCL 0.4 MG PO CAPS: 0.4000 mg | ORAL_CAPSULE | Freq: Every day | ORAL | 0 refills | Status: DC

## 2017-05-12 MED ORDER — MIRTAZAPINE 15 MG PO TBDP
15.0000 mg | ORAL_TABLET | Freq: Every day | ORAL | 0 refills | Status: DC
Start: 2017-05-12 — End: 2018-08-28

## 2017-05-12 MED ORDER — ATORVASTATIN CALCIUM 80 MG PO TABS: 80.0000 mg | ORAL_TABLET | Freq: Every day | ORAL | 3 refills | Status: DC

## 2017-05-12 MED ORDER — METOPROLOL TARTRATE 25 MG PO TABS: 25.0000 mg | ORAL_TABLET | Freq: Two times a day (BID) | ORAL | 0 refills | Status: DC

## 2017-05-12 NOTE — Progress Notes (Signed)
Patient to be discharged home.  Deatra Ina, PA present to give discharge paperwork.  Patient and son verbalized understanding of discharge instructions.  All patient belongings sent with patient, including DME and prescriptions.  Appears to be in no immediate distress at this time.  Dani Gobble, RN

## 2017-05-12 NOTE — Progress Notes (Signed)
Subjective/Complaints:  Discussed Urine cx results  ROS: pt denies nausea, vomiting, diarrhea, cough, shortness of breath or chest pain   Objective: Vital Signs: Blood pressure 122/73, pulse 73, temperature 98.1 F (36.7 C), temperature source Oral, resp. rate 17, height 5\' 11"  (1.803 m), weight 71.5 kg (157 lb 11.1 oz), SpO2 99 %. No results found. Results for orders placed or performed during the hospital encounter of 04/13/17 (from the past 72 hour(s))  Basic metabolic panel     Status: Abnormal   Collection Time: 05/10/17  8:48 AM  Result Value Ref Range   Sodium 141 135 - 145 mmol/L   Potassium 3.8 3.5 - 5.1 mmol/L   Chloride 106 101 - 111 mmol/L   CO2 28 22 - 32 mmol/L   Glucose, Bld 125 (H) 65 - 99 mg/dL   BUN 15 6 - 20 mg/dL   Creatinine, Ser 07/10/17 0.61 - 1.24 mg/dL   Calcium 9.2 8.9 - 4.75 mg/dL   GFR calc non Af Amer >60 >60 mL/min   GFR calc Af Amer >60 >60 mL/min    Comment: (NOTE) The eGFR has been calculated using the CKD EPI equation. This calculation has not been validated in all clinical situations. eGFR's persistently <60 mL/min signify possible Chronic Kidney Disease.    Anion gap 7 5 - 15  Urinalysis, Routine w reflex microscopic     Status: Abnormal   Collection Time: 05/10/17  2:30 PM  Result Value Ref Range   Color, Urine RED (A) YELLOW    Comment: BIOCHEMICALS MAY BE AFFECTED BY COLOR   APPearance TURBID (A) CLEAR   Specific Gravity, Urine  1.005 - 1.030    TEST NOT REPORTED DUE TO COLOR INTERFERENCE OF URINE PIGMENT   pH  5.0 - 8.0    TEST NOT REPORTED DUE TO COLOR INTERFERENCE OF URINE PIGMENT   Glucose, UA (A) NEGATIVE mg/dL    TEST NOT REPORTED DUE TO COLOR INTERFERENCE OF URINE PIGMENT   Hgb urine dipstick (A) NEGATIVE    TEST NOT REPORTED DUE TO COLOR INTERFERENCE OF URINE PIGMENT   Bilirubin Urine (A) NEGATIVE    TEST NOT REPORTED DUE TO COLOR INTERFERENCE OF URINE PIGMENT   Ketones, ur (A) NEGATIVE mg/dL    TEST NOT REPORTED DUE TO  COLOR INTERFERENCE OF URINE PIGMENT   Protein, ur (A) NEGATIVE mg/dL    TEST NOT REPORTED DUE TO COLOR INTERFERENCE OF URINE PIGMENT   Nitrite (A) NEGATIVE    TEST NOT REPORTED DUE TO COLOR INTERFERENCE OF URINE PIGMENT   Leukocytes, UA (A) NEGATIVE    TEST NOT REPORTED DUE TO COLOR INTERFERENCE OF URINE PIGMENT  Urine Culture     Status: None   Collection Time: 05/10/17  2:30 PM  Result Value Ref Range   Specimen Description URINE, CLEAN CATCH    Special Requests NONE    Culture NO GROWTH    Report Status 05/11/2017 FINAL   Urinalysis, Microscopic (reflex)     Status: Abnormal   Collection Time: 05/10/17  2:30 PM  Result Value Ref Range   RBC / HPF TOO NUMEROUS TO COUNT 0 - 5 RBC/hpf   WBC, UA 6-30 0 - 5 WBC/hpf   Bacteria, UA FEW (A) NONE SEEN   Squamous Epithelial / LPF 0-5 (A) NONE SEEN     HEENT: normal and dysarthric Cardio: RRR without murmur. No JVD     Resp: CTA Bilaterally without wheezes or rales. Normal effort     GI: BS  positive and NT, ND Extremity:  Pulses positive and No Edema Skin:   Intact Neuro: Alert/Oriented, Cranial Nerve Abnormalities Left central VII, Normal Sensory, Abnormal Motor 3- Left delt, Bi, tri, finger flexor/ext, hip/knee extensor synergy, 2-/5 at ankle, 4/5 in RUE and RLE ---motor exam stable. No nystagmus Musc/Skel:  Other no pain with UE or LE ROM Gen NAD   Assessment/Plan: 1. Functional deficits secondary to acute left hemiparesis, subacute right hemiparesis  Stable for D/C today F/u PCP in 3-4 weeks F/u PM&R 2 weeks See D/C summary See D/C instructionsFIM: Function - Bathing Position: Shower Body parts bathed by patient: Left arm, Right arm, Buttocks, Abdomen, Chest, Front perineal area, Right upper leg, Left upper leg, Right lower leg, Left lower leg Body parts bathed by helper: Back Assist Level: Supervision or verbal cues, Set up Assistive Device Comment: long handled sponge  Function- Upper Body Dressing/Undressing What is  the patient wearing?: Pull over shirt/dress Pull over shirt/dress - Perfomed by patient: Thread/unthread right sleeve, Put head through opening, Thread/unthread left sleeve, Pull shirt over trunk Pull over shirt/dress - Perfomed by helper: Put head through opening, Pull shirt over trunk Assist Level: More than reasonable time Set up : To obtain clothing/put away Function - Lower Body Dressing/Undressing What is the patient wearing?: Pants, Non-skid slipper socks, AFO Position: Wheelchair/chair at sink Pants- Performed by patient: Thread/unthread right pants leg, Thread/unthread left pants leg, Pull pants up/down Pants- Performed by helper: Pull pants up/down Non-skid slipper socks- Performed by patient: Don/doff left sock, Don/doff right sock Non-skid slipper socks- Performed by helper: Don/doff left sock Socks - Performed by patient: Don/doff right sock, Don/doff left sock Shoes - Performed by patient: Don/doff right shoe, Don/doff left shoe, Fasten right, Fasten left Shoes - Performed by helper: Don/doff right shoe, Don/doff left shoe, Fasten right, Fasten left AFO - Performed by patient: Don/doff left AFO, Don/doff right AFO AFO - Performed by helper: Don/doff left AFO, Don/doff right AFO TED Hose - Performed by helper: Don/doff right TED hose, Don/doff left TED hose Assist for footwear: Setup Assist for lower body dressing: Supervision or verbal cues Assistive Device Comment: sock-aid  Function - Toileting Toileting activity did not occur: N/A Toileting steps completed by patient: Adjust clothing prior to toileting, Performs perineal hygiene, Adjust clothing after toileting Toileting steps completed by helper: Adjust clothing prior to toileting Toileting Assistive Devices: Grab bar or rail Assist level: Supervision or verbal cues  Function - Air cabin crew transfer assistive device: Elevated toilet seat/BSC over toilet, Grab bar Assist level to toilet: Supervision or  verbal cues Assist level from toilet: Supervision or verbal cues  Function - Chair/bed transfer Chair/bed transfer method: Squat pivot Chair/bed transfer assist level: Supervision or verbal cues Chair/bed transfer assistive device: Armrests Chair/bed transfer details: Verbal cues for sequencing  Function - Locomotion: Wheelchair Will patient use wheelchair at discharge?: Yes Type: Manual Max wheelchair distance: 200' (05/09/17- unable to complete today due to pt fatigue) Assist Level: Supervision or verbal cues Assist Level: Supervision or verbal cues Wheel 150 feet activity did not occur: Safety/medical concerns Assist Level: Supervision or verbal cues Turns around,maneuvers to table,bed, and toilet,negotiates 3% grade,maneuvers on rugs and over doorsills: Yes Function - Locomotion: Ambulation Assistive device: Walker-rolling, Orthosis Max distance: 40' Assist level: Touching or steadying assistance (Pt > 75%) Walk 10 feet activity did not occur: Safety/medical concerns Assist level: Touching or steadying assistance (Pt > 75%) Walk 50 feet with 2 turns activity did not occur: Safety/medical concerns Assist level:  Touching or steadying assistance (Pt > 75%) Walk 150 feet activity did not occur: Safety/medical concerns Walk 10 feet on uneven surfaces activity did not occur: Safety/medical concerns  Function - Comprehension Comprehension: Auditory Comprehension assist level: Follows basic conversation/direction with no assist  Function - Expression Expression: Verbal Expression assist level: Expresses basic 90% of the time/requires cueing < 10% of the time.  Function - Social Interaction Social Interaction assist level: Interacts appropriately with others with medication or extra time (anti-anxiety, antidepressant).  Function - Problem Solving Problem solving assist level: Solves basic 90% of the time/requires cueing < 10% of the time  Function - Memory Memory assist level:  More than reasonable amount of time Patient normally able to recall (first 3 days only): Current season, That he or she is in a hospital, Staff names and faces  Medical Problem List and Plan: 1. Right hemiparesis as well as left side weakness/dysarthriasecondary to acute lacunar infarction involving the right medulla oblongata and medial aspect. Subacute large vessel left MCA infarct due to intracranial stenosis-    - CIR PT OT SLP-plan d/c today   -continue to work on vestibular exercises provided to him 2. DVT Prophylaxis/Anticoagulation: Subcutaneous Lovenox. Monitor platelet counts of any signs of bleeding 3. Pain Management/chronic back pain: Oxycodone as needed 4. Mood: Xanax 0.5 mg takes qhs- will not be a d/c med  Will d/c on Mirtazapine 5. Neuropsych: This patient iscapable of making decisions on hisown behalf. 6. Skin/Wound Care: Routine skin checks 7. Fluids/Electrolytes/Nutrition: Routine I&O's with follow-up chemistries 8.Hyperlipidemia. Lipitor 9.Tobacco abuse counseling 10.Recent left ureteral stone. Has hematuria potentiated by anticoagulants, last Hgb is 14.8 on 9/79/14 13.7  Continue Flomax. Follow-up urology services, has stent placement, scheduled for removal which will be delayed by CVA, uro f/u as OP 11.Hypertension.   Vitals:   05/11/17 2124 05/12/17 0340  BP: 111/71 122/73  Pulse: 83 73  Resp:  17  Temp:  98.1 F (36.7 C)  SpO2:  99%   13.  Tachycardia- improved, volume status looks normal, EKG show sinus rhythm- will monitor   -increased metoprolol to 48m bid with rate in 70-80s, BPs also running lower as a result 14.  Insomnia - remeron, His complaints are inconsistent. May need some relaxation strategies 15.stools remain formed, abx completed 16.  Bladder incont with freq at noc no dysuria, likely spastic bladder  17.  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  UTI with bacteremia: completed abx, the urine color is dark with sediment ,   recheck UA, 6-30WBC and few bact , C and S  Neg, d/c keflex            LOS (Days) 29 A FACE TO FACE EVALUATION WAS PERFORMED  Merridy Pascoe E 05/12/2017, 7:35 AM

## 2017-05-12 NOTE — Progress Notes (Signed)
Speech Language Pathology Discharge Summary  Patient Details  Name: SATCHEL HEIDINGER MRN: [id number] Date of Birth: 04-07-1956  Patient has met 4 of 4 long term goals.  Patient to discharge at overall Supervision;Modified Independent level.   Reasons goals not met: N/A    Clinical Impression/Discharge Summary: Patient has made functional gains and has met 4 of 4 LTG's this reporting period. Currently, patient is consuming Dys. 2 textures with nectar-thick liquids with minimal overt s/s of aspiration and Mod I-Supervision A for use of swallowing compensatory strategies including head turn to the right, multiple swallows and intermittent throat clear. Although improved, patient continues to demonstrate a moderate sensory-motor oropharyngeal dysphagia characterized by intermittent sensed and silent aspiration of thin liquids and moderate pharyngeal residue that is reduced with swallowing compensatory strategies. Recommend patient continue pharyngeal strengthening exercises, RMT and the water protocol with possible readiness for repeat MBS in ~2 weeks. Patient also continues to demonstrate a mild-moderate dysarthria due to impaired breath support that requires supervision A for use of speech intelligibility strategies. Patient education is complete and patient will discharge home with 24 hour supervision from family. Patient would benefit from f/u SLP services to maximize his speech and swallowing function and reduce caregiver burden.   Care Partner:  Caregiver Able to Provide Assistance: Yes  Type of Caregiver Assistance: Physical;Cognitive  Recommendation:  Outpatient SLP;24 hour supervision/assistance  Rationale for SLP Follow Up: Maximize swallowing safety;Maximize functional communication;Reduce caregiver burden   Equipment: N/A   Reasons for discharge: Treatment goals met;Discharged from hospital   Patient/Family Agrees with Progress Made and Goals Achieved: Yes    Weston Anna, [place],  [place]   [place], [place] 05/12/2017, 7:04 AM

## 2017-05-15 DIAGNOSIS — Z79891 Long term (current) use of opiate analgesic: Secondary | ICD-10-CM | POA: Diagnosis not present

## 2017-05-15 DIAGNOSIS — I69351 Hemiplegia and hemiparesis following cerebral infarction affecting right dominant side: Secondary | ICD-10-CM | POA: Diagnosis not present

## 2017-05-15 DIAGNOSIS — N201 Calculus of ureter: Secondary | ICD-10-CM | POA: Diagnosis not present

## 2017-05-15 DIAGNOSIS — Z7982 Long term (current) use of aspirin: Secondary | ICD-10-CM | POA: Diagnosis not present

## 2017-05-15 DIAGNOSIS — Z7902 Long term (current) use of antithrombotics/antiplatelets: Secondary | ICD-10-CM | POA: Diagnosis not present

## 2017-05-15 DIAGNOSIS — I2510 Atherosclerotic heart disease of native coronary artery without angina pectoris: Secondary | ICD-10-CM | POA: Diagnosis not present

## 2017-05-15 DIAGNOSIS — I719 Aortic aneurysm of unspecified site, without rupture: Secondary | ICD-10-CM | POA: Diagnosis not present

## 2017-05-15 DIAGNOSIS — M549 Dorsalgia, unspecified: Secondary | ICD-10-CM | POA: Diagnosis not present

## 2017-05-16 DIAGNOSIS — Z7982 Long term (current) use of aspirin: Secondary | ICD-10-CM | POA: Diagnosis not present

## 2017-05-16 DIAGNOSIS — I719 Aortic aneurysm of unspecified site, without rupture: Secondary | ICD-10-CM | POA: Diagnosis not present

## 2017-05-16 DIAGNOSIS — M549 Dorsalgia, unspecified: Secondary | ICD-10-CM | POA: Diagnosis not present

## 2017-05-16 DIAGNOSIS — I2510 Atherosclerotic heart disease of native coronary artery without angina pectoris: Secondary | ICD-10-CM | POA: Diagnosis not present

## 2017-05-16 DIAGNOSIS — I69351 Hemiplegia and hemiparesis following cerebral infarction affecting right dominant side: Secondary | ICD-10-CM | POA: Diagnosis not present

## 2017-05-16 DIAGNOSIS — Z7902 Long term (current) use of antithrombotics/antiplatelets: Secondary | ICD-10-CM | POA: Diagnosis not present

## 2017-05-16 DIAGNOSIS — Z79891 Long term (current) use of opiate analgesic: Secondary | ICD-10-CM | POA: Diagnosis not present

## 2017-05-16 DIAGNOSIS — N201 Calculus of ureter: Secondary | ICD-10-CM | POA: Diagnosis not present

## 2017-05-17 ENCOUNTER — Telehealth: Payer: Self-pay

## 2017-05-17 DIAGNOSIS — N201 Calculus of ureter: Secondary | ICD-10-CM | POA: Diagnosis not present

## 2017-05-17 DIAGNOSIS — I719 Aortic aneurysm of unspecified site, without rupture: Secondary | ICD-10-CM | POA: Diagnosis not present

## 2017-05-17 DIAGNOSIS — I2510 Atherosclerotic heart disease of native coronary artery without angina pectoris: Secondary | ICD-10-CM | POA: Diagnosis not present

## 2017-05-17 DIAGNOSIS — Z7902 Long term (current) use of antithrombotics/antiplatelets: Secondary | ICD-10-CM | POA: Diagnosis not present

## 2017-05-17 DIAGNOSIS — Z79891 Long term (current) use of opiate analgesic: Secondary | ICD-10-CM | POA: Diagnosis not present

## 2017-05-17 DIAGNOSIS — Z7982 Long term (current) use of aspirin: Secondary | ICD-10-CM | POA: Diagnosis not present

## 2017-05-17 DIAGNOSIS — M549 Dorsalgia, unspecified: Secondary | ICD-10-CM | POA: Diagnosis not present

## 2017-05-17 DIAGNOSIS — I69351 Hemiplegia and hemiparesis following cerebral infarction affecting right dominant side: Secondary | ICD-10-CM | POA: Diagnosis not present

## 2017-05-18 ENCOUNTER — Encounter (HOSPITAL_COMMUNITY): Payer: Self-pay

## 2017-05-18 ENCOUNTER — Emergency Department (HOSPITAL_COMMUNITY)
Admission: EM | Admit: 2017-05-18 | Discharge: 2017-05-18 | Disposition: A | Discharge status: Home / Self Care | Payer: Medicare Other | Attending: Emergency Medicine | Admitting: Emergency Medicine

## 2017-05-18 ENCOUNTER — Emergency Department (HOSPITAL_COMMUNITY): Payer: Medicare Other

## 2017-05-18 DIAGNOSIS — R233 Spontaneous ecchymoses: Secondary | ICD-10-CM | POA: Insufficient documentation

## 2017-05-18 DIAGNOSIS — Z79899 Other long term (current) drug therapy: Secondary | ICD-10-CM | POA: Insufficient documentation

## 2017-05-18 DIAGNOSIS — Z8673 Personal history of transient ischemic attack (TIA), and cerebral infarction without residual deficits: Secondary | ICD-10-CM | POA: Insufficient documentation

## 2017-05-18 DIAGNOSIS — R531 Weakness: Secondary | ICD-10-CM | POA: Insufficient documentation

## 2017-05-18 DIAGNOSIS — Z87891 Personal history of nicotine dependence: Secondary | ICD-10-CM | POA: Diagnosis not present

## 2017-05-18 DIAGNOSIS — Z7982 Long term (current) use of aspirin: Secondary | ICD-10-CM | POA: Insufficient documentation

## 2017-05-18 DIAGNOSIS — R200 Anesthesia of skin: Secondary | ICD-10-CM | POA: Insufficient documentation

## 2017-05-18 DIAGNOSIS — R202 Paresthesia of skin: Secondary | ICD-10-CM | POA: Diagnosis not present

## 2017-05-18 DIAGNOSIS — I2510 Atherosclerotic heart disease of native coronary artery without angina pectoris: Secondary | ICD-10-CM | POA: Insufficient documentation

## 2017-05-18 LAB — I-STAT CHEM 8, ED
BUN: 16 mg/dL (ref 6–20)
CALCIUM ION: 1.17 mmol/L (ref 1.15–1.40)
Chloride: 102 mmol/L (ref 101–111)
Creatinine, Ser: 0.8 mg/dL (ref 0.61–1.24)
GLUCOSE: 139 mg/dL — AB (ref 65–99)
HEMATOCRIT: 40 % (ref 39.0–52.0)
HEMOGLOBIN: 13.6 g/dL (ref 13.0–17.0)
POTASSIUM: 3.7 mmol/L (ref 3.5–5.1)
Sodium: 141 mmol/L (ref 135–145)
TCO2: 26 mmol/L (ref 22–32)

## 2017-05-18 LAB — CBC
HEMATOCRIT: 41.2 % (ref 39.0–52.0)
HEMOGLOBIN: 13.6 g/dL (ref 13.0–17.0)
MCH: 30.8 pg (ref 26.0–34.0)
MCHC: 33 g/dL (ref 30.0–36.0)
MCV: 93.4 fL (ref 78.0–100.0)
Platelets: 430 10*3/uL — ABNORMAL HIGH (ref 150–400)
RBC: 4.41 MIL/uL (ref 4.22–5.81)
RDW: 14.7 % (ref 11.5–15.5)
WBC: 10 10*3/uL (ref 4.0–10.5)

## 2017-05-18 LAB — APTT: APTT: 25 s (ref 24–36)

## 2017-05-18 LAB — DIFFERENTIAL
BASOS ABS: 0 10*3/uL (ref 0.0–0.1)
Basophils Relative: 0 %
Eosinophils Absolute: 0.1 10*3/uL (ref 0.0–0.7)
Eosinophils Relative: 1 %
LYMPHS ABS: 2.2 10*3/uL (ref 0.7–4.0)
Lymphocytes Relative: 22 %
MONO ABS: 0.6 10*3/uL (ref 0.1–1.0)
MONOS PCT: 6 %
NEUTROS ABS: 7.1 10*3/uL (ref 1.7–7.7)
Neutrophils Relative %: 71 %

## 2017-05-18 LAB — RAPID URINE DRUG SCREEN, HOSP PERFORMED
Amphetamines: NOT DETECTED
BARBITURATES: NOT DETECTED
Benzodiazepines: NOT DETECTED
COCAINE: NOT DETECTED
Opiates: NOT DETECTED
Tetrahydrocannabinol: NOT DETECTED

## 2017-05-18 LAB — COMPREHENSIVE METABOLIC PANEL
ALK PHOS: 129 U/L — AB (ref 38–126)
ALT: 59 U/L (ref 17–63)
AST: 36 U/L (ref 15–41)
Albumin: 4.2 g/dL (ref 3.5–5.0)
Anion gap: 11 (ref 5–15)
BILIRUBIN TOTAL: 0.8 mg/dL (ref 0.3–1.2)
BUN: 17 mg/dL (ref 6–20)
CO2: 27 mmol/L (ref 22–32)
Calcium: 9.5 mg/dL (ref 8.9–10.3)
Chloride: 100 mmol/L — ABNORMAL LOW (ref 101–111)
Creatinine, Ser: 0.91 mg/dL (ref 0.61–1.24)
GFR calc Af Amer: >60 mL/min (ref >60)
Glucose, Bld: 142 mg/dL — ABNORMAL HIGH (ref 65–99)
POTASSIUM: 3.7 mmol/L (ref 3.5–5.1)
Sodium: 138 mmol/L (ref 135–145)
TOTAL PROTEIN: 7.6 g/dL (ref 6.5–8.1)

## 2017-05-18 LAB — URINALYSIS, ROUTINE W REFLEX MICROSCOPIC

## 2017-05-18 LAB — CBG MONITORING, ED: GLUCOSE-CAPILLARY: 153 mg/dL — AB (ref 65–99)

## 2017-05-18 LAB — ETHANOL: Alcohol, Ethyl (B): <10 mg/dL (ref <10)

## 2017-05-18 LAB — URINALYSIS, MICROSCOPIC (REFLEX)
BACTERIA UA: NONE SEEN
SQUAMOUS EPITHELIAL / LPF: NONE SEEN
WBC, UA: NONE SEEN WBC/hpf (ref 0–5)

## 2017-05-18 LAB — PROTIME-INR
INR: 0.99
Prothrombin Time: 13 seconds (ref 11.4–15.2)

## 2017-05-18 LAB — I-STAT TROPONIN, ED: TROPONIN I, POC: 0.01 ng/mL (ref 0.00–0.08)

## 2017-05-18 NOTE — ED Provider Notes (Signed)
[facility] Provider Note   CSN: [id number] Arrival date & time: 05/18/17  4098     History   Chief Complaint Chief Complaint  Patient presents with  . Numbness    HPI Brendan Charles is a 61 y.o. male.  The history is provided by the patient.  Illness  This is a new problem. The current episode started 6 to 12 hours ago. The problem occurs constantly. The problem has not changed since onset.Pertinent negatives include no chest pain, no abdominal pain, no headaches and no shortness of breath. Nothing aggravates the symptoms. Nothing relieves the symptoms. He has tried nothing for the symptoms.   61 year old male who presents with left-sided numbness and weakness. 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 He is on statin and dual antiplatelet therapy. Reports that he has been home from rehabilitation for about 1 week now, and went to bed yesterday at 10 PM at baseline. Reports that he woke up at 1 AM with left-sided numbness and weakness which is new. Denies any new vision or speech symptoms, recent illnesses including fevers, cough, vomiting, diarrhea, urinary complaints, chest pain or difficulty breathing. Dr. Gerilyn Pilgrim is his neurologist.   Past Medical History:  Diagnosis Date  . Anxiety   . Chronic back pain   . CVA (cerebral vascular accident) (HCC) 03/20/2017  . Hepatitis C    HEP C, never treated, 2007    Patient Active Problem List   Diagnosis Date Noted  . Spastic hemiparesis of left dominant side as late effect of cerebral infarction (HCC) 04/19/2017  . Gait disturbance, post-stroke 04/18/2017  . Adjustment disorder with depressed mood   . Cerebral infarction due to thrombosis of right middle cerebral artery (HCC) 04/13/2017  . Weakness   . Acute ischemic stroke (HCC) 04/12/2017  . Stroke (cerebrum) (HCC) 04/12/2017  . Acute CVA (cerebrovascular  accident) (HCC) 04/12/2017  . CVA (cerebral vascular accident) (HCC) 03/20/2017  . Hydronephrosis, left 03/20/2017  . Aortic atherosclerosis (HCC) 03/20/2017  . Hepatitis C 02/14/2017  . Encounter for screening colonoscopy 02/14/2017  . Spondylolisthesis at L3-L4 level 06/03/2014    Past Surgical History:  Procedure Laterality Date  . CERVICAL DISC SURGERY  2001   anterior  . COLONOSCOPY WITH PROPOFOL N/A 03/14/2017   Procedure: COLONOSCOPY WITH PROPOFOL;  Surgeon: West Bali, MD;  Location: [facility];  Service: Endoscopy;  Laterality: N/A;  8:15am  . CYSTOSCOPY W/ URETERAL STENT PLACEMENT Left 03/23/2017   Procedure: CYSTOSCOPY WITH LEFT RETROGRADE PYELOGRAM/URETERAL LEFT STENT PLACEMENT;  Surgeon: Malen Gauze, MD;  Location: [facility];  Service: Urology;  Laterality: Left;  . LUMBAR SPINE SURGERY  2015  . POLYPECTOMY  03/14/2017   Procedure: POLYPECTOMY;  Surgeon: West Bali, MD;  Location: [facility];  Service: Endoscopy;;  rectum       Home Medications    Prior to Admission medications   Medication Sig Start Date End Date Taking? Authorizing Provider  ALPRAZolam Prudy Feeler) 0.5 MG tablet Take 0.5 mg by mouth 3 (three) times daily as needed for anxiety.   Yes [provider]  aspirin 325 MG tablet Take 1 tablet (325 mg total) by mouth daily. 03/22/17  Yes Rhetta Mura, MD  atorvastatin (LIPITOR) 80 MG tablet Take 1 tablet (80 mg total) by mouth daily at 6 PM. 05/12/17  Yes Angiulli, Mcarthur Rossetti, PA-C  clopidogrel (PLAVIX) 75 MG tablet  Take 1 tablet (75 mg total) by mouth daily with breakfast. 05/12/17  Yes Angiulli, Mcarthur Rossetti, PA-C  Maltodextrin-Xanthan Gum (RESOURCE THICKENUP CLEAR) POWD Take 360 g by mouth as needed. 05/12/17  Yes Angiulli, Mcarthur Rossetti, PA-C  metoprolol tartrate (LOPRESSOR) 25 MG tablet Take 1 tablet (25 mg total) by mouth 2 (two) times daily. 05/12/17  Yes Angiulli, Mcarthur Rossetti, PA-C  mirtazapine (REMERON SOL-TAB) 15 MG disintegrating tablet  Take 1 tablet (15 mg total) by mouth at bedtime. 05/12/17  Yes Angiulli, Mcarthur Rossetti, PA-C  oxyCODONE-acetaminophen (PERCOCET) 7.5-325 MG tablet Take 1 tablet by mouth every 6 (six) hours as needed for severe pain. 05/12/17  Yes Angiulli, Mcarthur Rossetti, PA-C  tamsulosin (FLOMAX) 0.4 MG CAPS capsule Take 1 capsule (0.4 mg total) by mouth daily. 05/12/17  Yes Angiulli, Mcarthur Rossetti, PA-C    Family History Family History  Problem Relation Age of Onset  . Cancer Mother   . Colon cancer Neg Hx     Social History Social History  Substance Use Topics  . Smoking status: Former Smoker    Packs/day: 0.50    Years: 45.00    Types: Cigarettes  . Smokeless tobacco: Never Used     Comment: pt states quit smoking 1 wk ago  . Alcohol use No     Allergies   Codeine   Review of Systems Review of Systems  Constitutional: Negative for fever.  Eyes: Negative for visual disturbance.  Respiratory: Negative for shortness of breath.   Cardiovascular: Negative for chest pain.  Gastrointestinal: Negative for abdominal pain.  Genitourinary: Negative for dysuria.  Neurological: Positive for weakness and numbness. Negative for headaches.  Hematological: Bruises/bleeds easily.  All other systems reviewed and are negative.    Physical Exam Updated Vital Signs BP 112/73   Pulse 88   Temp 98 F (36.7 C) (Oral)   Resp 17   Ht  (1.803 m)   Wt 68 kg (150 lb)   SpO2 97%   BMI 20.92 kg/m   Physical Exam Physical Exam  Nursing note and vitals reviewed. Constitutional: Non-toxic, and in no acute distress Head: Normocephalic and atraumatic.  Mouth/Throat: Oropharynx is clear and moist.  Neck: Normal range of motion. Neck supple.  Cardiovascular: Normal rate and regular rhythm.   Pulmonary/Chest: Effort normal and breath sounds normal.  Abdominal: Soft. There is no tenderness. There is no rebound and no guarding.  Musculoskeletal: Normal range of motion.  Skin: Skin is warm and dry.  Psychiatric:  Cooperative Neurological:  Alert, oriented to person, place, time, and situation. Memory grossly in tact. Fluent speech. No dysarthria or aphasia.  Cranial nerves: VF are full. EOMI without nystagmus. No gaze deviation. Facial muscles symmetric with activation. Sensation to light touch over face diminished over left face. Hearing grossly in tact. Palate elevates symmetrically. Head turn and shoulder shrug are intact. Tongue midline.  Reflexes defered.  Muscle bulk and tone normal. Mild pronator drift of LUE. Drift of LLE against gravity. Weakness in left hand grip and left ankle dorsi/plantar-flexion Sensation to light touch is diminished in LLE and LUE. Coordination reveals no dysmetria with finger to nose. Slowing of LUE.      ED Treatments / Results  Labs (all labs ordered are listed, but only abnormal results are displayed) Labs Reviewed  CBC - Abnormal; Notable for the following:       Result Value   Platelets 430 (*)    All other components within normal limits  COMPREHENSIVE METABOLIC PANEL -  Abnormal; Notable for the following:    Chloride 100 (*)    Glucose, Bld 142 (*)    Alkaline Phosphatase 129 (*)    All other components within normal limits  URINALYSIS, ROUTINE W REFLEX MICROSCOPIC - Abnormal; Notable for the following:    Color, Urine RED (*)    APPearance TURBID (*)    Glucose, UA   (*)    Value: TEST NOT REPORTED DUE TO COLOR INTERFERENCE OF URINE PIGMENT   Hgb urine dipstick   (*)    Value: TEST NOT REPORTED DUE TO COLOR INTERFERENCE OF URINE PIGMENT   Bilirubin Urine   (*)    Value: TEST NOT REPORTED DUE TO COLOR INTERFERENCE OF URINE PIGMENT   Ketones, ur   (*)    Value: TEST NOT REPORTED DUE TO COLOR INTERFERENCE OF URINE PIGMENT   Protein, ur   (*)    Value: TEST NOT REPORTED DUE TO COLOR INTERFERENCE OF URINE PIGMENT   Nitrite   (*)    Value: TEST NOT REPORTED DUE TO COLOR INTERFERENCE OF URINE PIGMENT   Leukocytes, UA   (*)    Value: TEST NOT REPORTED  DUE TO COLOR INTERFERENCE OF URINE PIGMENT   All other components within normal limits  CBG MONITORING, ED - Abnormal; Notable for the following:    Glucose-Capillary 153 (*)    All other components within normal limits  I-STAT CHEM 8, ED - Abnormal; Notable for the following:    Glucose, Bld 139 (*)    All other components within normal limits  URINE CULTURE  ETHANOL  PROTIME-INR  APTT  DIFFERENTIAL  RAPID URINE DRUG SCREEN, HOSP PERFORMED  URINALYSIS, MICROSCOPIC (REFLEX)  I-STAT TROPONIN, ED    EKG  EKG Interpretation  Date/Time:  Thursday May 18 2017 07:31:44 EDT Ventricular Rate:  104 PR Interval:    QRS Duration: 87 QT Interval:  331 QTC Calculation: 436 R Axis:   77 Text Interpretation:  Sinus tachycardia Anteroseptal infarct, old No acute changes Confirmed by Crista Curb [phone]) on 05/18/2017 7:58:29 AM       Radiology Ct Head Wo Contrast  Result Date: 05/18/2017 CLINICAL DATA:  Left side numbness. EXAM: CT HEAD WITHOUT CONTRAST TECHNIQUE: Contiguous axial images were obtained from the base of the skull through the vertex without intravenous contrast. COMPARISON:  MRI and CT 04/12/2017 FINDINGS: Brain: Old bilateral basal ganglia and left periventricular lacunar infarcts, stable. No acute intracranial abnormality. Specifically, no hemorrhage, hydrocephalus, mass lesion, acute infarction, or significant intracranial injury. Vascular: No hyperdense vessel or unexpected calcification. Skull: No acute calvarial abnormality. Sinuses/Orbits: Mucosal thickening in the paranasal sinuses. No air-fluid levels. Mastoid air cells are clear. Orbital soft tissues unremarkable. Other: None IMPRESSION: Stable chronic lacunar infarcts.  No acute intracranial abnormality. Electronically Signed   By: Charlett Nose M.D.   On: 05/18/2017 08:47   Mr Maxine Glenn Head Wo Contrast  Result Date: 05/18/2017 CLINICAL DATA:  Left-sided numbness and weakness.  Prior stroke. EXAM: MRI HEAD WITHOUT  CONTRAST MRA HEAD WITHOUT CONTRAST TECHNIQUE: Multiplanar, multiecho pulse sequences of the brain and surrounding structures were obtained without intravenous contrast. Angiographic images of the head were obtained using MRA technique without contrast. COMPARISON:  Head CT 05/18/2017, MRI 04/12/2017, CTA 04/12/2017, and MRA 03/20/2017 FINDINGS: MRI HEAD FINDINGS Brain: The left corona radiata and basal ganglia infarcts demonstrates expected evolution with development of encephalomalacia compared to the prior MRI. The small right medullary infarct also demonstrates expected evolutionary changes with some residual hyperintense trace  diffusion signal but no persistent are new restricted diffusion to indicate recurrent is skewed ischemia. There is no evidence of acute infarct elsewhere. No intracranial hemorrhage, mass, midline shift, or extra-axial fluid collection is identified. Chronic small vessel ischemic changes are stable from the prior MRI, with numerous chronic lacunar infarcts again noted in the basal ganglia and cerebral white matter bilaterally. There is mild cerebral atrophy. Vascular: Major intracranial vascular flow voids are preserved. Skull and upper cervical spine: No suspicious marrow lesion. Sinuses/Orbits: Unremarkable orbits. Mild left maxillary sinus mucosal thickening. Small right and moderate left mastoid effusions, similar to the prior MRI. Other: None. MRA HEAD FINDINGS The visualized distal vertebral arteries are patent to the basilar with the left being dominant and widely patent. Severe tandem right V4 segment stenoses proximal to the PICA origin are similar to the prior CTA. Patent PICA, AICA, and SCA origins are identified bilaterally. The basilar artery is widely patent. There is a small left posterior communicating artery. The PCAs are patent without evidence of significant proximal stenosis. The internal carotid arteries are widely patent from skullbase to carotid termini. A left  posterior communicating artery infundibulum is noted. The ACAs and MCAs are patent without evidence of proximal branch occlusion or significant proximal stenosis. IMPRESSION: 1. No acute intracranial abnormality. 2. Expected evolution of left basal ganglia/corona radiata and right medullary infarcts. 3. Chronic small vessel ischemia with numerous chronic lacunar infarcts. 4. Unchanged severe tandem right V4 vertebral artery stenoses. Electronically Signed   By: Sebastian Ache M.D.   On: 05/18/2017 10:38   Mr Brain Wo Contrast  Result Date: 05/18/2017 CLINICAL DATA:  Left-sided numbness and weakness.  Prior stroke. EXAM: MRI HEAD WITHOUT CONTRAST MRA HEAD WITHOUT CONTRAST TECHNIQUE: Multiplanar, multiecho pulse sequences of the brain and surrounding structures were obtained without intravenous contrast. Angiographic images of the head were obtained using MRA technique without contrast. COMPARISON:  Head CT 05/18/2017, MRI 04/12/2017, CTA 04/12/2017, and MRA 03/20/2017 FINDINGS: MRI HEAD FINDINGS Brain: The left corona radiata and basal ganglia infarcts demonstrates expected evolution with development of encephalomalacia compared to the prior MRI. The small right medullary infarct also demonstrates expected evolutionary changes with some residual hyperintense trace diffusion signal but no persistent are new restricted diffusion to indicate recurrent is skewed ischemia. There is no evidence of acute infarct elsewhere. No intracranial hemorrhage, mass, midline shift, or extra-axial fluid collection is identified. Chronic small vessel ischemic changes are stable from the prior MRI, with numerous chronic lacunar infarcts again noted in the basal ganglia and cerebral white matter bilaterally. There is mild cerebral atrophy. Vascular: Major intracranial vascular flow voids are preserved. Skull and upper cervical spine: No suspicious marrow lesion. Sinuses/Orbits: Unremarkable orbits. Mild left maxillary sinus mucosal  thickening. Small right and moderate left mastoid effusions, similar to the prior MRI. Other: None. MRA HEAD FINDINGS The visualized distal vertebral arteries are patent to the basilar with the left being dominant and widely patent. Severe tandem right V4 segment stenoses proximal to the PICA origin are similar to the prior CTA. Patent PICA, AICA, and SCA origins are identified bilaterally. The basilar artery is widely patent. There is a small left posterior communicating artery. The PCAs are patent without evidence of significant proximal stenosis. The internal carotid arteries are widely patent from skullbase to carotid termini. A left posterior communicating artery infundibulum is noted. The ACAs and MCAs are patent without evidence of proximal branch occlusion or significant proximal stenosis. IMPRESSION: 1. No acute intracranial abnormality. 2. Expected evolution of  left basal ganglia/corona radiata and right medullary infarcts. 3. Chronic small vessel ischemia with numerous chronic lacunar infarcts. 4. Unchanged severe tandem right V4 vertebral artery stenoses. Electronically Signed   By: Sebastian Ache M.D.   On: 05/18/2017 10:38    Procedures Procedures (including critical care time)  Medications Ordered in ED Medications - No data to display   Initial Impression / Assessment and Plan / ED Course  I have reviewed the triage vital signs and the nursing notes.  Pertinent labs & imaging results that were available during my care of the patient were reviewed by me and considered in my medical decision making (see chart for details).    History of Previous stroke with residual right-sided weakness who presents with left-sided numbness and weakness. Concern for new onset stroke. Does report diminished sensation over the left arm and leg. There is mild drift of the left arm and leg against gravity.  CT head visualized and shows chronic lacunar infarcts but no acute intracranial processes. Blood work  ressuring. H/o of hematuria with left ureteral stent, pending urology follow-up. This is unchanged. No signs of UTI.   MRI/MRA of the brain was obtained, visualized and shows no evidence of acute infarct or other acute injury cranial processes. Discussed with Dr. Loretha Brasil from neurology. No other workup is necessary. Did recommend outpatient loop recorder. He is on maximal medical therapy. Strict return and follow-up instructions reviewed. He expressed understanding of all discharge instructions and felt comfortable with the plan of care.   Final Clinical Impressions(s) / ED Diagnoses   Final diagnoses:  Left sided numbness    New Prescriptions New Prescriptions   No medications on file     Lavera Guise, MD 05/18/17 1313

## 2017-05-18 NOTE — ED Notes (Signed)
Pt son reports that pt requires nectar thick and soft food. Pt reports eating oatmeal and banana this morning

## 2017-05-18 NOTE — ED Notes (Signed)
Pt returned from CT °

## 2017-05-18 NOTE — ED Notes (Signed)
Pt transported to CT ?

## 2017-05-18 NOTE — Discharge Instructions (Signed)
Your MRI does not show a new stroke today.  Please follow-up closely with Dr. Gerilyn Pilgrim  Please also see your PCP and see if you can be set up with a loop recorder to monitor you for arrhythmias  Please return without fail for worsening symptoms, including confusion, new speech or vision changes, worsening numbness/weakness or any other symptoms concerning to you.

## 2017-05-18 NOTE — ED Triage Notes (Addendum)
Pt reports left sided numbness that he noticed when he woke up at 1 am. Reports he went to bed at 7 pm. Numbness in left arm and left leg. Son reports series of strokes on right side in august  and still has weakness . Pt is on blood thinners and is currently on nectar thick liquids and mech soft diet. Pt tearful during triage. Dr Gerilyn Pilgrim is neuro

## 2017-05-18 NOTE — ED Notes (Signed)
Pt transported to MRI 

## 2017-05-18 NOTE — ED Notes (Signed)
EDP at bedside  

## 2017-05-19 ENCOUNTER — Inpatient Hospital Stay: Payer: Medicare Other | Admitting: Physical Medicine & Rehabilitation

## 2017-05-19 DIAGNOSIS — I69351 Hemiplegia and hemiparesis following cerebral infarction affecting right dominant side: Secondary | ICD-10-CM | POA: Diagnosis not present

## 2017-05-19 DIAGNOSIS — F419 Anxiety disorder, unspecified: Secondary | ICD-10-CM | POA: Insufficient documentation

## 2017-05-19 DIAGNOSIS — I10 Essential (primary) hypertension: Secondary | ICD-10-CM | POA: Insufficient documentation

## 2017-05-19 DIAGNOSIS — I69354 Hemiplegia and hemiparesis following cerebral infarction affecting left non-dominant side: Secondary | ICD-10-CM | POA: Insufficient documentation

## 2017-05-19 DIAGNOSIS — Z7982 Long term (current) use of aspirin: Secondary | ICD-10-CM | POA: Diagnosis not present

## 2017-05-19 DIAGNOSIS — Z7902 Long term (current) use of antithrombotics/antiplatelets: Secondary | ICD-10-CM | POA: Diagnosis not present

## 2017-05-19 DIAGNOSIS — Z87891 Personal history of nicotine dependence: Secondary | ICD-10-CM | POA: Insufficient documentation

## 2017-05-19 DIAGNOSIS — G4700 Insomnia, unspecified: Secondary | ICD-10-CM | POA: Insufficient documentation

## 2017-05-19 DIAGNOSIS — I719 Aortic aneurysm of unspecified site, without rupture: Secondary | ICD-10-CM | POA: Diagnosis not present

## 2017-05-19 DIAGNOSIS — N201 Calculus of ureter: Secondary | ICD-10-CM | POA: Diagnosis not present

## 2017-05-19 DIAGNOSIS — M549 Dorsalgia, unspecified: Secondary | ICD-10-CM | POA: Diagnosis not present

## 2017-05-19 DIAGNOSIS — F329 Major depressive disorder, single episode, unspecified: Secondary | ICD-10-CM | POA: Insufficient documentation

## 2017-05-19 DIAGNOSIS — Z79891 Long term (current) use of opiate analgesic: Secondary | ICD-10-CM | POA: Diagnosis not present

## 2017-05-19 DIAGNOSIS — I2510 Atherosclerotic heart disease of native coronary artery without angina pectoris: Secondary | ICD-10-CM | POA: Diagnosis not present

## 2017-05-20 LAB — URINE CULTURE: Culture: <10000 — AB

## 2017-05-21 DIAGNOSIS — N390 Urinary tract infection, site not specified: Secondary | ICD-10-CM | POA: Diagnosis not present

## 2017-05-21 DIAGNOSIS — I69391 Dysphagia following cerebral infarction: Secondary | ICD-10-CM | POA: Diagnosis not present

## 2017-05-21 DIAGNOSIS — I69351 Hemiplegia and hemiparesis following cerebral infarction affecting right dominant side: Secondary | ICD-10-CM | POA: Diagnosis not present

## 2017-05-21 DIAGNOSIS — R1310 Dysphagia, unspecified: Secondary | ICD-10-CM | POA: Diagnosis not present

## 2017-05-22 DIAGNOSIS — N201 Calculus of ureter: Secondary | ICD-10-CM | POA: Diagnosis not present

## 2017-05-22 DIAGNOSIS — Z7902 Long term (current) use of antithrombotics/antiplatelets: Secondary | ICD-10-CM | POA: Diagnosis not present

## 2017-05-22 DIAGNOSIS — I69351 Hemiplegia and hemiparesis following cerebral infarction affecting right dominant side: Secondary | ICD-10-CM | POA: Diagnosis not present

## 2017-05-22 DIAGNOSIS — M549 Dorsalgia, unspecified: Secondary | ICD-10-CM | POA: Diagnosis not present

## 2017-05-22 DIAGNOSIS — I719 Aortic aneurysm of unspecified site, without rupture: Secondary | ICD-10-CM | POA: Diagnosis not present

## 2017-05-22 DIAGNOSIS — Z79891 Long term (current) use of opiate analgesic: Secondary | ICD-10-CM | POA: Diagnosis not present

## 2017-05-22 DIAGNOSIS — Z7982 Long term (current) use of aspirin: Secondary | ICD-10-CM | POA: Diagnosis not present

## 2017-05-22 DIAGNOSIS — I2510 Atherosclerotic heart disease of native coronary artery without angina pectoris: Secondary | ICD-10-CM | POA: Diagnosis not present

## 2017-05-23 ENCOUNTER — Encounter: Payer: Self-pay | Admitting: Physical Medicine & Rehabilitation

## 2017-05-23 ENCOUNTER — Encounter: Payer: Medicare Other | Attending: Physical Medicine & Rehabilitation

## 2017-05-23 ENCOUNTER — Ambulatory Visit (HOSPITAL_BASED_OUTPATIENT_CLINIC_OR_DEPARTMENT_OTHER): Payer: Medicare Other | Admitting: Physical Medicine & Rehabilitation

## 2017-05-23 VITALS — BP 126/79 | HR 82 | Resp 14

## 2017-05-23 DIAGNOSIS — I69351 Hemiplegia and hemiparesis following cerebral infarction affecting right dominant side: Secondary | ICD-10-CM | POA: Diagnosis not present

## 2017-05-23 DIAGNOSIS — I10 Essential (primary) hypertension: Secondary | ICD-10-CM | POA: Diagnosis not present

## 2017-05-23 DIAGNOSIS — Z87891 Personal history of nicotine dependence: Secondary | ICD-10-CM | POA: Diagnosis not present

## 2017-05-23 DIAGNOSIS — I69354 Hemiplegia and hemiparesis following cerebral infarction affecting left non-dominant side: Secondary | ICD-10-CM

## 2017-05-23 DIAGNOSIS — G4700 Insomnia, unspecified: Secondary | ICD-10-CM | POA: Diagnosis not present

## 2017-05-23 DIAGNOSIS — F419 Anxiety disorder, unspecified: Secondary | ICD-10-CM | POA: Diagnosis not present

## 2017-05-23 DIAGNOSIS — F329 Major depressive disorder, single episode, unspecified: Secondary | ICD-10-CM | POA: Diagnosis not present

## 2017-05-23 NOTE — Telephone Encounter (Signed)
Attempted to reach patient no answer and no voice mail to leave message, tc call was not completed.

## 2017-05-23 NOTE — Patient Instructions (Signed)
The urologist. Maryclare Labrador determine how long you are on the Flomax  Your primary care doctor. We'll prescribe your pain medications, and year. Blood pressure medications  Neurology will advise on your anticoagulation medications  Continue on the Remeron. I would recommend 6 months and then taper off  Follow-up with me in 1 month, at that time, he should be ready for outpatient therapy and I can write the orders

## 2017-05-23 NOTE — Telephone Encounter (Signed)
error 

## 2017-05-23 NOTE — Progress Notes (Signed)
Subjective:    Patient ID: Brendan Charles, male    DOB: Mar 02, 1956, 61 y.o.   MRN: [id number] 61 year old right-handed male, history of tobacco abuse, hepatitis C, recent CVA with right-sided weakness due to left MCA infarction, March 20, 2017, was discharged on March 21, 2017, ambulating minimal assist to 100 feet using a rolling walker, discharged on aspirin and Plavix.  He lives with family.  He used a walker prior to admission.  He was receiving home health therapies.  He was doing well until April 12, 2017, with left-sided weakness, numbness, as well as severe dysarthria.  CT MRI showed small acute right medullary infarction.  No associated hemorrhage, evolution of left corona radiata, basal ganglia infarction since March 20, 2017. CT angiogram of head and neck with no dissection or aneurysm.  No large- vessel occlusion.  Recent echocardiogram with ejection fraction of 65% overall motion abnormality.  The patient also with recent left ureteral stone seen by Urology Service, Dr. Wilkie Aye.  Underwent cystoscopy, March 23, 2017, showing moderate hydronephrosis. DATE OF ADMISSION:  04/13/2017 DATE OF DISCHARGE:  05/12/2017  HPI Interval hx Left hemibody numbness. Motor repeat MRI was negative for new stroke, was seen by neurology. Has outpatient appointment with neurology on Monday Oct 19 We'll be seeing his family doctor tomorrow. Has had home health PT, OT and speech. Patient is dressing independently. Uses a shower chair, needs assistance getting in and out, but is able to bathe himself otherwise.  Patient had a fall yesterday while transferring, no one was assisting him at that time. No injury. Pain Inventory Average Pain 4 Pain Right Now 4 My pain is stabbing and tingling  In the last 24 hours, has pain interfered with the following? General activity 5 Relation with others 1 Enjoyment of life 0 What TIME of day is your pain at its worst? varies Sleep  (in general) Fair  Pain is worse with: unsure Pain improves with: medication Relief from Meds: 8  Mobility walk with assistance use a walker ability to climb steps?  yes do you drive?  no use a wheelchair Do you have any goals in this area?  yes  Function disabled: date disabled . retired I need assistance with the following:  bathing, meal prep, household duties and shopping Do you have any goals in this area?  yes  Neuro/Psych bladder control problems bowel control problems weakness numbness tingling trouble walking dizziness  Prior Studies Any changes since last visit?  no  Physicians involved in your care Any changes since last visit?  no   Family History  Problem Relation Age of Onset  . Cancer Mother   . Colon cancer Neg Hx    Social History   Social History  . Marital status: Divorced    Spouse name: N/A  . Number of children: N/A  . Years of education: N/A   Social History Main Topics  . Smoking status: Former Smoker    Packs/day: 0.50    Years: 45.00    Types: Cigarettes  . Smokeless tobacco: Never Used     Comment: pt states quit smoking 1 wk ago  . Alcohol use No  . Drug use: No  . Sexual activity: Not Currently    Birth control/ protection: None   Other Topics Concern  . None   Social History Narrative  . None   Past Surgical History:  Procedure Laterality Date  . CERVICAL DISC SURGERY  2001   anterior  . COLONOSCOPY WITH  PROPOFOL N/A 03/14/2017   Procedure: COLONOSCOPY WITH PROPOFOL;  Surgeon: West Bali, MD;  [facility];  Service: Endoscopy;  Laterality: N/A;  8:15am  . CYSTOSCOPY W/ URETERAL STENT PLACEMENT Left 03/23/2017   Procedure: CYSTOSCOPY WITH LEFT RETROGRADE PYELOGRAM/URETERAL LEFT STENT PLACEMENT;  Surgeon: Malen Gauze, MD;  [facility]: [facility];  Service: Urology;  Laterality: Left;  . LUMBAR SPINE SURGERY  2015  . POLYPECTOMY  03/14/2017   Procedure: POLYPECTOMY;  Surgeon: West Bali, MD;   [facility];  Service: Endoscopy;;  rectum   Past Medical History:  Diagnosis Date  . Anxiety   . Chronic back pain   . CVA (cerebral vascular accident) (HCC) 03/20/2017  . Hepatitis C    HEP C, never treated, 2007   BP 126/79   Pulse 82   Resp 14   SpO2 96%   Opioid Risk Score:   Fall Risk Score:  `1  Depression screen PHQ 2/9  Depression screen PHQ 2/9 05/23/2017  Decreased Interest 0  Down, Depressed, Hopeless 0  PHQ - 2 Score 0  Altered sleeping 0  Tired, decreased energy 0  Change in appetite 0  Feeling bad or failure about yourself  0  Trouble concentrating 0  Moving slowly or fidgety/restless 0  Suicidal thoughts 0  PHQ-9 Score 0  Difficult doing work/chores Not difficult at all    Review of Systems  Constitutional: Positive for unexpected weight change.  HENT: Negative.   Eyes: Negative.   Respiratory: Negative.   Cardiovascular: Negative.   Gastrointestinal: Negative.   Endocrine: Negative.   Genitourinary: Positive for difficulty urinating and dysuria.  Musculoskeletal: Positive for back pain and gait problem.  Skin: Negative.   Allergic/Immunologic: Negative.   Neurological: Positive for dizziness, weakness and numbness.       Tingling  Hematological: Bruises/bleeds easily.  Psychiatric/Behavioral: Negative.   All other systems reviewed and are negative.      Objective:   Physical Exam  Constitutional: He is oriented to person, place, and time. He appears well-developed and well-nourished.  HENT:  Head: Normocephalic and atraumatic.  Eyes: Pupils are equal, round, and reactive to light. Conjunctivae and EOM are normal.  Neck: Normal range of motion.  Cardiovascular: Normal rate and regular rhythm.   Pulmonary/Chest: Effort normal and breath sounds normal. No respiratory distress.  Abdominal: Soft. Bowel sounds are normal. He exhibits no distension.  Musculoskeletal: He exhibits no edema.  No pain with shoulder range of motion  bilaterally  Neurological: He is alert and oriented to person, place, and time. A sensory deficit is present. Gait abnormal.  Motor strength is 4/5 in the right deltoid, biceps, triceps, grip, hip flexor, knee extensor, ankle dorsiflexor. 3 minus of the left deltoid, 3 at the biceps, triceps, finger flexors, extensors, 3 plus at the left hip flexors, knee extensors 3 minus of the left ankle dorsiflexor, plantar flexor  Stands with supervision, does not have his assisted device with him. Therefore, no gait was attempted  Light touch sensation diminished on the left side.  Nursing note and vitals reviewed.         Assessment & Plan:   1. History of right medullary as well as left basal ganglia and corona radiata infarcts. Patient has residual left greater than right sided weakness as well as left hemisensory deficits. Continue Plavix 75 mg a day, aspirin 325 mg a day and Lipitor 80 mg a day- risk factor reduction. Follow up with neurology and primary  care  Recommend continued home health therapy, physical medicine rehabilitation follow-up in one month, likely transition to outpatient therapy at that time  2. Hypertension, controlled. Continue on Lopressor 25 mg twice a day  3. Chronic back pain, he is on hydrocodone prescribed by his family doctor, he will see the family doctor tomorrow.  4. History of hydronephrosis status post ureteral stent, will see the urologist next week, continue Flomax point 4 mg per day  5. Insomnia plus depression. Continue Remeron 15 mg at night

## 2017-05-24 DIAGNOSIS — M549 Dorsalgia, unspecified: Secondary | ICD-10-CM | POA: Diagnosis not present

## 2017-05-24 DIAGNOSIS — I639 Cerebral infarction, unspecified: Secondary | ICD-10-CM | POA: Diagnosis not present

## 2017-05-24 DIAGNOSIS — I2510 Atherosclerotic heart disease of native coronary artery without angina pectoris: Secondary | ICD-10-CM | POA: Diagnosis not present

## 2017-05-24 DIAGNOSIS — Z7902 Long term (current) use of antithrombotics/antiplatelets: Secondary | ICD-10-CM | POA: Diagnosis not present

## 2017-05-24 DIAGNOSIS — Z6824 Body mass index (BMI) 24.0-24.9, adult: Secondary | ICD-10-CM | POA: Diagnosis not present

## 2017-05-24 DIAGNOSIS — N201 Calculus of ureter: Secondary | ICD-10-CM | POA: Diagnosis not present

## 2017-05-24 DIAGNOSIS — I69351 Hemiplegia and hemiparesis following cerebral infarction affecting right dominant side: Secondary | ICD-10-CM | POA: Diagnosis not present

## 2017-05-24 DIAGNOSIS — Z7982 Long term (current) use of aspirin: Secondary | ICD-10-CM | POA: Diagnosis not present

## 2017-05-24 DIAGNOSIS — I719 Aortic aneurysm of unspecified site, without rupture: Secondary | ICD-10-CM | POA: Diagnosis not present

## 2017-05-24 DIAGNOSIS — Z79891 Long term (current) use of opiate analgesic: Secondary | ICD-10-CM | POA: Diagnosis not present

## 2017-05-26 DIAGNOSIS — I2510 Atherosclerotic heart disease of native coronary artery without angina pectoris: Secondary | ICD-10-CM | POA: Diagnosis not present

## 2017-05-26 DIAGNOSIS — Z7982 Long term (current) use of aspirin: Secondary | ICD-10-CM | POA: Diagnosis not present

## 2017-05-26 DIAGNOSIS — I719 Aortic aneurysm of unspecified site, without rupture: Secondary | ICD-10-CM | POA: Diagnosis not present

## 2017-05-26 DIAGNOSIS — I69351 Hemiplegia and hemiparesis following cerebral infarction affecting right dominant side: Secondary | ICD-10-CM | POA: Diagnosis not present

## 2017-05-26 DIAGNOSIS — M549 Dorsalgia, unspecified: Secondary | ICD-10-CM | POA: Diagnosis not present

## 2017-05-26 DIAGNOSIS — I69391 Dysphagia following cerebral infarction: Secondary | ICD-10-CM | POA: Diagnosis not present

## 2017-05-26 DIAGNOSIS — R1310 Dysphagia, unspecified: Secondary | ICD-10-CM | POA: Diagnosis not present

## 2017-05-26 DIAGNOSIS — Z7902 Long term (current) use of antithrombotics/antiplatelets: Secondary | ICD-10-CM | POA: Diagnosis not present

## 2017-05-26 DIAGNOSIS — B9620 Unspecified Escherichia coli [E. coli] as the cause of diseases classified elsewhere: Secondary | ICD-10-CM | POA: Diagnosis not present

## 2017-05-26 DIAGNOSIS — Z79891 Long term (current) use of opiate analgesic: Secondary | ICD-10-CM | POA: Diagnosis not present

## 2017-05-26 DIAGNOSIS — N390 Urinary tract infection, site not specified: Secondary | ICD-10-CM | POA: Diagnosis not present

## 2017-05-26 DIAGNOSIS — E785 Hyperlipidemia, unspecified: Secondary | ICD-10-CM | POA: Diagnosis not present

## 2017-05-26 DIAGNOSIS — N201 Calculus of ureter: Secondary | ICD-10-CM | POA: Diagnosis not present

## 2017-05-29 DIAGNOSIS — Z7902 Long term (current) use of antithrombotics/antiplatelets: Secondary | ICD-10-CM | POA: Diagnosis not present

## 2017-05-29 DIAGNOSIS — R269 Unspecified abnormalities of gait and mobility: Secondary | ICD-10-CM | POA: Diagnosis not present

## 2017-05-29 DIAGNOSIS — Z7982 Long term (current) use of aspirin: Secondary | ICD-10-CM | POA: Diagnosis not present

## 2017-05-29 DIAGNOSIS — Z79891 Long term (current) use of opiate analgesic: Secondary | ICD-10-CM | POA: Diagnosis not present

## 2017-05-29 DIAGNOSIS — I719 Aortic aneurysm of unspecified site, without rupture: Secondary | ICD-10-CM | POA: Diagnosis not present

## 2017-05-29 DIAGNOSIS — I2510 Atherosclerotic heart disease of native coronary artery without angina pectoris: Secondary | ICD-10-CM | POA: Diagnosis not present

## 2017-05-29 DIAGNOSIS — I69351 Hemiplegia and hemiparesis following cerebral infarction affecting right dominant side: Secondary | ICD-10-CM | POA: Diagnosis not present

## 2017-05-29 DIAGNOSIS — M549 Dorsalgia, unspecified: Secondary | ICD-10-CM | POA: Diagnosis not present

## 2017-05-29 DIAGNOSIS — N201 Calculus of ureter: Secondary | ICD-10-CM | POA: Diagnosis not present

## 2017-05-29 DIAGNOSIS — G8192 Hemiplegia, unspecified affecting left dominant side: Secondary | ICD-10-CM | POA: Diagnosis not present

## 2017-05-31 ENCOUNTER — Encounter: Payer: Self-pay | Admitting: Neurology

## 2017-05-31 DIAGNOSIS — N201 Calculus of ureter: Secondary | ICD-10-CM | POA: Diagnosis not present

## 2017-05-31 DIAGNOSIS — Z79891 Long term (current) use of opiate analgesic: Secondary | ICD-10-CM | POA: Diagnosis not present

## 2017-05-31 DIAGNOSIS — I719 Aortic aneurysm of unspecified site, without rupture: Secondary | ICD-10-CM | POA: Diagnosis not present

## 2017-05-31 DIAGNOSIS — Z7902 Long term (current) use of antithrombotics/antiplatelets: Secondary | ICD-10-CM | POA: Diagnosis not present

## 2017-05-31 DIAGNOSIS — Z7982 Long term (current) use of aspirin: Secondary | ICD-10-CM | POA: Diagnosis not present

## 2017-05-31 DIAGNOSIS — I2510 Atherosclerotic heart disease of native coronary artery without angina pectoris: Secondary | ICD-10-CM | POA: Diagnosis not present

## 2017-05-31 DIAGNOSIS — I69351 Hemiplegia and hemiparesis following cerebral infarction affecting right dominant side: Secondary | ICD-10-CM | POA: Diagnosis not present

## 2017-05-31 DIAGNOSIS — M549 Dorsalgia, unspecified: Secondary | ICD-10-CM | POA: Diagnosis not present

## 2017-06-01 ENCOUNTER — Other Ambulatory Visit (HOSPITAL_COMMUNITY): Payer: Self-pay | Admitting: Specialist

## 2017-06-01 DIAGNOSIS — I719 Aortic aneurysm of unspecified site, without rupture: Secondary | ICD-10-CM | POA: Diagnosis not present

## 2017-06-01 DIAGNOSIS — M549 Dorsalgia, unspecified: Secondary | ICD-10-CM | POA: Diagnosis not present

## 2017-06-01 DIAGNOSIS — I2510 Atherosclerotic heart disease of native coronary artery without angina pectoris: Secondary | ICD-10-CM | POA: Diagnosis not present

## 2017-06-01 DIAGNOSIS — R1319 Other dysphagia: Secondary | ICD-10-CM

## 2017-06-01 DIAGNOSIS — I69351 Hemiplegia and hemiparesis following cerebral infarction affecting right dominant side: Secondary | ICD-10-CM | POA: Diagnosis not present

## 2017-06-01 DIAGNOSIS — N201 Calculus of ureter: Secondary | ICD-10-CM | POA: Diagnosis not present

## 2017-06-01 DIAGNOSIS — Z7902 Long term (current) use of antithrombotics/antiplatelets: Secondary | ICD-10-CM | POA: Diagnosis not present

## 2017-06-01 DIAGNOSIS — Z79891 Long term (current) use of opiate analgesic: Secondary | ICD-10-CM | POA: Diagnosis not present

## 2017-06-01 DIAGNOSIS — Z7982 Long term (current) use of aspirin: Secondary | ICD-10-CM | POA: Diagnosis not present

## 2017-06-02 DIAGNOSIS — Z7902 Long term (current) use of antithrombotics/antiplatelets: Secondary | ICD-10-CM | POA: Diagnosis not present

## 2017-06-02 DIAGNOSIS — I2510 Atherosclerotic heart disease of native coronary artery without angina pectoris: Secondary | ICD-10-CM | POA: Diagnosis not present

## 2017-06-02 DIAGNOSIS — N201 Calculus of ureter: Secondary | ICD-10-CM | POA: Diagnosis not present

## 2017-06-02 DIAGNOSIS — I719 Aortic aneurysm of unspecified site, without rupture: Secondary | ICD-10-CM | POA: Diagnosis not present

## 2017-06-02 DIAGNOSIS — M549 Dorsalgia, unspecified: Secondary | ICD-10-CM | POA: Diagnosis not present

## 2017-06-02 DIAGNOSIS — Z79891 Long term (current) use of opiate analgesic: Secondary | ICD-10-CM | POA: Diagnosis not present

## 2017-06-02 DIAGNOSIS — Z7982 Long term (current) use of aspirin: Secondary | ICD-10-CM | POA: Diagnosis not present

## 2017-06-02 DIAGNOSIS — I69351 Hemiplegia and hemiparesis following cerebral infarction affecting right dominant side: Secondary | ICD-10-CM | POA: Diagnosis not present

## 2017-06-05 DIAGNOSIS — Z79891 Long term (current) use of opiate analgesic: Secondary | ICD-10-CM | POA: Diagnosis not present

## 2017-06-05 DIAGNOSIS — M549 Dorsalgia, unspecified: Secondary | ICD-10-CM | POA: Diagnosis not present

## 2017-06-05 DIAGNOSIS — Z7982 Long term (current) use of aspirin: Secondary | ICD-10-CM | POA: Diagnosis not present

## 2017-06-05 DIAGNOSIS — I2510 Atherosclerotic heart disease of native coronary artery without angina pectoris: Secondary | ICD-10-CM | POA: Diagnosis not present

## 2017-06-05 DIAGNOSIS — N201 Calculus of ureter: Secondary | ICD-10-CM | POA: Diagnosis not present

## 2017-06-05 DIAGNOSIS — Z7902 Long term (current) use of antithrombotics/antiplatelets: Secondary | ICD-10-CM | POA: Diagnosis not present

## 2017-06-05 DIAGNOSIS — I69351 Hemiplegia and hemiparesis following cerebral infarction affecting right dominant side: Secondary | ICD-10-CM | POA: Diagnosis not present

## 2017-06-05 DIAGNOSIS — I719 Aortic aneurysm of unspecified site, without rupture: Secondary | ICD-10-CM | POA: Diagnosis not present

## 2017-06-06 ENCOUNTER — Ambulatory Visit (HOSPITAL_COMMUNITY): Payer: Medicare Other | Attending: Physician Assistant | Admitting: Speech Pathology

## 2017-06-06 ENCOUNTER — Ambulatory Visit (HOSPITAL_COMMUNITY)
Admission: RE | Admit: 2017-06-06 | Discharge: 2017-06-06 | Disposition: A | Discharge status: Home / Self Care | Payer: Medicare Other | Source: Ambulatory Visit | Attending: Physician Assistant | Admitting: Physician Assistant

## 2017-06-06 ENCOUNTER — Encounter (HOSPITAL_COMMUNITY): Payer: Self-pay | Admitting: Speech Pathology

## 2017-06-06 DIAGNOSIS — I69351 Hemiplegia and hemiparesis following cerebral infarction affecting right dominant side: Secondary | ICD-10-CM | POA: Diagnosis not present

## 2017-06-06 DIAGNOSIS — N201 Calculus of ureter: Secondary | ICD-10-CM | POA: Diagnosis not present

## 2017-06-06 DIAGNOSIS — Z7982 Long term (current) use of aspirin: Secondary | ICD-10-CM | POA: Diagnosis not present

## 2017-06-06 DIAGNOSIS — I719 Aortic aneurysm of unspecified site, without rupture: Secondary | ICD-10-CM | POA: Diagnosis not present

## 2017-06-06 DIAGNOSIS — M549 Dorsalgia, unspecified: Secondary | ICD-10-CM | POA: Diagnosis not present

## 2017-06-06 DIAGNOSIS — R1312 Dysphagia, oropharyngeal phase: Secondary | ICD-10-CM | POA: Diagnosis not present

## 2017-06-06 DIAGNOSIS — I639 Cerebral infarction, unspecified: Secondary | ICD-10-CM | POA: Diagnosis not present

## 2017-06-06 DIAGNOSIS — R1319 Other dysphagia: Secondary | ICD-10-CM | POA: Diagnosis not present

## 2017-06-06 DIAGNOSIS — Z79891 Long term (current) use of opiate analgesic: Secondary | ICD-10-CM | POA: Diagnosis not present

## 2017-06-06 DIAGNOSIS — I2510 Atherosclerotic heart disease of native coronary artery without angina pectoris: Secondary | ICD-10-CM | POA: Diagnosis not present

## 2017-06-06 DIAGNOSIS — Z7902 Long term (current) use of antithrombotics/antiplatelets: Secondary | ICD-10-CM | POA: Diagnosis not present

## 2017-06-06 DIAGNOSIS — R1310 Dysphagia, unspecified: Secondary | ICD-10-CM | POA: Diagnosis not present

## 2017-06-06 NOTE — Therapy (Signed)
[facility] [facility] [address] [place], [place], [address] Phone: [phone]   Fax:  [phone]  Modified Barium Swallow  Patient Details  Name: Brendan GreenhouseDouglas B Charles MRN: [id number] Date of Birth: 1956/06/16 No Data Recorded  Encounter Date: 06/06/2017      End of Session - 06/06/17 1458    Visit Number 1   Number of Visits 1   Authorization Type [employer] Medicare   SLP Start Time 1330   SLP Stop Time  1410   SLP Time Calculation (min) 40 min   Activity Tolerance Patient tolerated treatment well      Past Medical History:  Diagnosis Date  . Anxiety   . Chronic back pain   . CVA (cerebral vascular accident) (HCC) 03/20/2017  . Hepatitis C    HEP C, never treated, 2007    Past Surgical History:  Procedure Laterality Date  . CERVICAL DISC SURGERY  2001   anterior  . COLONOSCOPY WITH PROPOFOL N/A 03/14/2017   Procedure: COLONOSCOPY WITH PROPOFOL;  Surgeon: West BaliFields, Sandi L, MD;  [facility];  Service: Endoscopy;  Laterality: N/A;  8:15am  . CYSTOSCOPY W/ URETERAL STENT PLACEMENT Left 03/23/2017   Procedure: CYSTOSCOPY WITH LEFT RETROGRADE PYELOGRAM/URETERAL LEFT STENT PLACEMENT;  Surgeon: Malen GauzeMcKenzie, Patrick L, MD;  [facility];  Service: Urology;  Laterality: Left;  . LUMBAR SPINE SURGERY  2015  . POLYPECTOMY  03/14/2017   Procedure: POLYPECTOMY;  Surgeon: West BaliFields, Sandi L, MD;  [facility];  Service: Endoscopy;;  rectum    There were no vitals filed for this visit.      Subjective Assessment - 06/06/17 1445    Subjective "I haven't been so good about adding thickener to my liquids at home."   Special Tests MBSS   Currently in Pain? No/denies             General - 06/06/17 1446      General Information   Date of Onset 04/12/17   HPI Brendan MartinetDouglas Charles is a 61 year old right-handed male who was referred for repeat MBSS by Dr. Claudette LawsAndrew Kirsteins due to dysphagia following stroke. Pt with history of tobacco  abuse, hepatitis C, recent CVA with right-sided weakness due to left MCA infarction, March 20, 2017, was discharged on March 21, 2017, ambulating minimal assist to 100 feet using a rolling walker, discharged on aspirin and Plavix. He lives with family. He used a walker prior to admission. He was receiving home health therapies. He was doing well until April 12, 2017, with left-sided weakness, numbness, as well as severe dysarthria. CT MRI showed small acute right medullary infarction. No associated hemorrhage, evolution of left corona radiata, basal ganglia infarction since March 20, 2017. CT angiogram of head and neck with no dissection or aneurysm. No large-vessel occlusion. Recent echocardiogram with ejection fraction of 65% overall motion abnormality. The patient also with recent left ureteral stone seen by Urology Service, Dr. Wilkie AyePatrick McKenzie. Underwent cystoscopy, March 23, 2017, showing moderate hydronephrosis. Pt had MBSS on 04/14/17, 04/26/17, and 05/08/17 with last recommendation of D2 and NTL. Pt has been participating in dysphagia therapy with [facility] SLP.   Type of Study MBS-Modified Barium Swallow Study   Previous Swallow Assessment MBSS 9/7, 9/19 (D1/NTL), and 05/08/17 (D2/NTL)   Diet Prior to this Study Nectar-thick liquids;Dysphagia 2 (chopped)   Temperature Spikes Noted No   Respiratory Status Room air   History of Recent Intubation No   Behavior/Cognition Alert;Cooperative;Pleasant mood  Oral Cavity Assessment Within Functional Limits   Oral Care Completed by SLP No   Oral Cavity - Dentition Adequate natural dentition;Missing dentition   Vision Functional for self feeding   Self-Feeding Abilities Able to feed self   Patient Positioning Upright in chair   Baseline Vocal Quality Normal;Low vocal intensity   Volitional Cough Strong   Volitional Swallow Able to elicit   Anatomy Within functional limits   Pharyngeal Secretions Not observed secondary MBS             Oral Preparation/Oral Phase - 06/06/17 1451      Oral Preparation/Oral Phase   Oral Phase Within functional limits     Electrical stimulation - Oral Phase   Was Electrical Stimulation Used No          Pharyngeal Phase - 06/06/17 1451      Pharyngeal Phase   Pharyngeal Phase Impaired     Pharyngeal - Thin   Pharyngeal- Thin Cup Swallow initiation at vallecula;Pharyngeal residue - pyriform;Reduced epiglottic inversion  trace/min pyriform residue   Pharyngeal- Thin Straw Pharyngeal residue - pyriform;Reduced epiglottic inversion  trace/min pyriform residue     Pharyngeal - Solids   Pharyngeal- Puree Swallow initiation at vallecula;Reduced epiglottic inversion;Pharyngeal residue - valleculae;Pharyngeal residue - posterior pharnyx  effortful swallow effective in preventing PPW residue   Pharyngeal- Regular Delayed swallow initiation-vallecula;Reduced epiglottic inversion;Pharyngeal residue - valleculae   Pharyngeal- Pill Trace aspiration;Reduced airway/laryngeal closure;Reduced epiglottic inversion;Penetration/Aspiration before swallow;Pharyngeal residue - pyriform  taken with thin liquids   Pharyngeal Material enters airway, passes BELOW cords then ejected out  trace thin aspiration before swallow when taking pill     Pharyngeal Phase - Comment   Pharyngeal Comment Pt appears to have osteophytes at C3-4 which negatively impacts epiglottic deflection     Electrical Stimulation - Pharyngeal Phase   Was Electrical Stimulation Used No          Cricopharyngeal Phase - 06/06/17 1457      Cervical Esophageal Phase   Cervical Esophageal Phase Within functional limits          Plan - 06/06/17 1459    Clinical Impression Statement Pt demonstrates improved oropharyngeal swallow function from previous studies. Pt with mild pharyngeal phase dysphagia characterized by swallow trigger at the level of the valleculae across textures and consistencies, reduced epiglottic  deflection likely negatively impacted by suspected osteophytes at C3-4, and reduced laryngeal vestibule closure resulting in min vallecular residue and occasional posterior pharyngeal wall residue (immediately adjacent to epiglottis), and one episode of trace aspiration of thin liquids before the swallow when swallowing the barium tablet with cup sip thin. The trace aspiration occurred before the swallow due to premature spillage of thin bolus which deflected off posterior pharyngeal wall protrusion (osteophyte) and into laryngeal vestibule. The trace aspiration was removed following a throat clear. Pt instructed to implement effortful/hard swallow during bites of puree and regular textures which improved posterior pharyngeal wall clearance (min vallcular residue remained due to incomplete epiglottic deflection). Recommend D3/mech soft with self regulated regular textures (as approved by treating SLP clinically) and thin liquids via cup sip, clear throat periodically and repeat/dry swallow; avoid use of straws and recommend taking pill whole with a thicker liquid or with puree.    Consulted and Agree with Plan of Care Patient      Patient will benefit from skilled therapeutic intervention in order to improve the following deficits and impairments:   Dysphagia, oropharyngeal phase      G-Codes -  06/06/17 1536    Functional Assessment Tool Used MBSS; clinical judgment   Functional Limitations Swallowing   Swallow Current Status (Z6109) At least 1 percent but less than 20 percent impaired, limited or restricted   Swallow Goal Status (U0454) At least 1 percent but less than 20 percent impaired, limited or restricted   Swallow Discharge Status [phone]) At least 1 percent but less than 20 percent impaired, limited or restricted          Recommendations/Treatment - 06/06/17 1457      Swallow Evaluation Recommendations   SLP Diet Recommendations Dysphagia 3 (mechanical soft);Thin   Liquid  Administration via Cup;No straw   Medication Administration Whole meds with puree   Supervision Patient able to self feed   Compensations Multiple dry swallows after each bite/sip;Clear throat intermittently   Postural Changes Seated upright at 90 degrees;Remain upright for at least 30 minutes after feeds/meals          Prognosis - 06/06/17 1458      Prognosis   Prognosis for Safe Diet Advancement Good   Barriers to Reach Goals Severity of deficits      Problem List Patient Active Problem List   Diagnosis Date Noted  . Spastic hemiparesis of left dominant side as late effect of cerebral infarction (HCC) 04/19/2017  . Gait disturbance, post-stroke 04/18/2017  . Adjustment disorder with depressed mood   . Cerebral infarction due to thrombosis of right middle cerebral artery (HCC) 04/13/2017  . Weakness   . Acute ischemic stroke (HCC) 04/12/2017  . Stroke (cerebrum) (HCC) 04/12/2017  . Acute CVA (cerebrovascular accident) (HCC) 04/12/2017  . CVA (cerebral vascular accident) (HCC) 03/20/2017  . Hydronephrosis, left 03/20/2017  . Aortic atherosclerosis (HCC) 03/20/2017  . Hepatitis C 02/14/2017  . Encounter for screening colonoscopy 02/14/2017  . Spondylolisthesis at L3-L4 level 06/03/2014   Thank you,  Havery Moros, CCC-SLP [phone]  [facility] 06/06/2017, 3:38 PM  [place] [facility] [address] [place], [place], [address] Phone: [phone]   Fax:  [phone]  Name: VAIDEN ADAMES MRN: [id number] Date of Birth: 04-06-1956

## 2017-06-07 DIAGNOSIS — I2510 Atherosclerotic heart disease of native coronary artery without angina pectoris: Secondary | ICD-10-CM | POA: Diagnosis not present

## 2017-06-07 DIAGNOSIS — Z7982 Long term (current) use of aspirin: Secondary | ICD-10-CM | POA: Diagnosis not present

## 2017-06-07 DIAGNOSIS — N201 Calculus of ureter: Secondary | ICD-10-CM | POA: Diagnosis not present

## 2017-06-07 DIAGNOSIS — I719 Aortic aneurysm of unspecified site, without rupture: Secondary | ICD-10-CM | POA: Diagnosis not present

## 2017-06-07 DIAGNOSIS — I69351 Hemiplegia and hemiparesis following cerebral infarction affecting right dominant side: Secondary | ICD-10-CM | POA: Diagnosis not present

## 2017-06-07 DIAGNOSIS — Z7902 Long term (current) use of antithrombotics/antiplatelets: Secondary | ICD-10-CM | POA: Diagnosis not present

## 2017-06-07 DIAGNOSIS — M549 Dorsalgia, unspecified: Secondary | ICD-10-CM | POA: Diagnosis not present

## 2017-06-07 DIAGNOSIS — Z79891 Long term (current) use of opiate analgesic: Secondary | ICD-10-CM | POA: Diagnosis not present

## 2017-06-08 DIAGNOSIS — B9620 Unspecified Escherichia coli [E. coli] as the cause of diseases classified elsewhere: Secondary | ICD-10-CM | POA: Diagnosis not present

## 2017-06-08 DIAGNOSIS — R1310 Dysphagia, unspecified: Secondary | ICD-10-CM | POA: Diagnosis not present

## 2017-06-08 DIAGNOSIS — N201 Calculus of ureter: Secondary | ICD-10-CM | POA: Diagnosis not present

## 2017-06-08 DIAGNOSIS — I69391 Dysphagia following cerebral infarction: Secondary | ICD-10-CM | POA: Diagnosis not present

## 2017-06-08 DIAGNOSIS — I2510 Atherosclerotic heart disease of native coronary artery without angina pectoris: Secondary | ICD-10-CM | POA: Diagnosis not present

## 2017-06-08 DIAGNOSIS — I719 Aortic aneurysm of unspecified site, without rupture: Secondary | ICD-10-CM | POA: Diagnosis not present

## 2017-06-08 DIAGNOSIS — Z7902 Long term (current) use of antithrombotics/antiplatelets: Secondary | ICD-10-CM | POA: Diagnosis not present

## 2017-06-08 DIAGNOSIS — Z7982 Long term (current) use of aspirin: Secondary | ICD-10-CM | POA: Diagnosis not present

## 2017-06-08 DIAGNOSIS — N390 Urinary tract infection, site not specified: Secondary | ICD-10-CM | POA: Diagnosis not present

## 2017-06-08 DIAGNOSIS — Z79891 Long term (current) use of opiate analgesic: Secondary | ICD-10-CM | POA: Diagnosis not present

## 2017-06-08 DIAGNOSIS — I69351 Hemiplegia and hemiparesis following cerebral infarction affecting right dominant side: Secondary | ICD-10-CM | POA: Diagnosis not present

## 2017-06-08 DIAGNOSIS — M549 Dorsalgia, unspecified: Secondary | ICD-10-CM | POA: Diagnosis not present

## 2017-06-08 DIAGNOSIS — E785 Hyperlipidemia, unspecified: Secondary | ICD-10-CM | POA: Diagnosis not present

## 2017-06-11 DIAGNOSIS — I639 Cerebral infarction, unspecified: Secondary | ICD-10-CM | POA: Diagnosis not present

## 2017-06-12 DIAGNOSIS — Z7902 Long term (current) use of antithrombotics/antiplatelets: Secondary | ICD-10-CM | POA: Diagnosis not present

## 2017-06-12 DIAGNOSIS — I69351 Hemiplegia and hemiparesis following cerebral infarction affecting right dominant side: Secondary | ICD-10-CM | POA: Diagnosis not present

## 2017-06-12 DIAGNOSIS — Z79891 Long term (current) use of opiate analgesic: Secondary | ICD-10-CM | POA: Diagnosis not present

## 2017-06-12 DIAGNOSIS — I719 Aortic aneurysm of unspecified site, without rupture: Secondary | ICD-10-CM | POA: Diagnosis not present

## 2017-06-12 DIAGNOSIS — N201 Calculus of ureter: Secondary | ICD-10-CM | POA: Diagnosis not present

## 2017-06-12 DIAGNOSIS — Z7982 Long term (current) use of aspirin: Secondary | ICD-10-CM | POA: Diagnosis not present

## 2017-06-12 DIAGNOSIS — I2510 Atherosclerotic heart disease of native coronary artery without angina pectoris: Secondary | ICD-10-CM | POA: Diagnosis not present

## 2017-06-12 DIAGNOSIS — M549 Dorsalgia, unspecified: Secondary | ICD-10-CM | POA: Diagnosis not present

## 2017-06-13 ENCOUNTER — Other Ambulatory Visit: Payer: Self-pay | Admitting: Urology

## 2017-06-14 DIAGNOSIS — Z7982 Long term (current) use of aspirin: Secondary | ICD-10-CM | POA: Diagnosis not present

## 2017-06-14 DIAGNOSIS — I69351 Hemiplegia and hemiparesis following cerebral infarction affecting right dominant side: Secondary | ICD-10-CM | POA: Diagnosis not present

## 2017-06-14 DIAGNOSIS — N201 Calculus of ureter: Secondary | ICD-10-CM | POA: Diagnosis not present

## 2017-06-14 DIAGNOSIS — M549 Dorsalgia, unspecified: Secondary | ICD-10-CM | POA: Diagnosis not present

## 2017-06-14 DIAGNOSIS — I719 Aortic aneurysm of unspecified site, without rupture: Secondary | ICD-10-CM | POA: Diagnosis not present

## 2017-06-14 DIAGNOSIS — I2510 Atherosclerotic heart disease of native coronary artery without angina pectoris: Secondary | ICD-10-CM | POA: Diagnosis not present

## 2017-06-14 DIAGNOSIS — Z79891 Long term (current) use of opiate analgesic: Secondary | ICD-10-CM | POA: Diagnosis not present

## 2017-06-14 DIAGNOSIS — Z7902 Long term (current) use of antithrombotics/antiplatelets: Secondary | ICD-10-CM | POA: Diagnosis not present

## 2017-06-15 DIAGNOSIS — I2510 Atherosclerotic heart disease of native coronary artery without angina pectoris: Secondary | ICD-10-CM | POA: Diagnosis not present

## 2017-06-15 DIAGNOSIS — Z79891 Long term (current) use of opiate analgesic: Secondary | ICD-10-CM | POA: Diagnosis not present

## 2017-06-15 DIAGNOSIS — Z7902 Long term (current) use of antithrombotics/antiplatelets: Secondary | ICD-10-CM | POA: Diagnosis not present

## 2017-06-15 DIAGNOSIS — I719 Aortic aneurysm of unspecified site, without rupture: Secondary | ICD-10-CM | POA: Diagnosis not present

## 2017-06-15 DIAGNOSIS — I69351 Hemiplegia and hemiparesis following cerebral infarction affecting right dominant side: Secondary | ICD-10-CM | POA: Diagnosis not present

## 2017-06-15 DIAGNOSIS — M549 Dorsalgia, unspecified: Secondary | ICD-10-CM | POA: Diagnosis not present

## 2017-06-15 DIAGNOSIS — N201 Calculus of ureter: Secondary | ICD-10-CM | POA: Diagnosis not present

## 2017-06-15 DIAGNOSIS — Z7982 Long term (current) use of aspirin: Secondary | ICD-10-CM | POA: Diagnosis not present

## 2017-06-16 DIAGNOSIS — Z7982 Long term (current) use of aspirin: Secondary | ICD-10-CM | POA: Diagnosis not present

## 2017-06-16 DIAGNOSIS — I719 Aortic aneurysm of unspecified site, without rupture: Secondary | ICD-10-CM | POA: Diagnosis not present

## 2017-06-16 DIAGNOSIS — M549 Dorsalgia, unspecified: Secondary | ICD-10-CM | POA: Diagnosis not present

## 2017-06-16 DIAGNOSIS — N201 Calculus of ureter: Secondary | ICD-10-CM | POA: Diagnosis not present

## 2017-06-16 DIAGNOSIS — I69351 Hemiplegia and hemiparesis following cerebral infarction affecting right dominant side: Secondary | ICD-10-CM | POA: Diagnosis not present

## 2017-06-16 DIAGNOSIS — I2510 Atherosclerotic heart disease of native coronary artery without angina pectoris: Secondary | ICD-10-CM | POA: Diagnosis not present

## 2017-06-16 DIAGNOSIS — Z7902 Long term (current) use of antithrombotics/antiplatelets: Secondary | ICD-10-CM | POA: Diagnosis not present

## 2017-06-16 DIAGNOSIS — Z79891 Long term (current) use of opiate analgesic: Secondary | ICD-10-CM | POA: Diagnosis not present

## 2017-06-19 DIAGNOSIS — I2510 Atherosclerotic heart disease of native coronary artery without angina pectoris: Secondary | ICD-10-CM | POA: Diagnosis not present

## 2017-06-19 DIAGNOSIS — M549 Dorsalgia, unspecified: Secondary | ICD-10-CM | POA: Diagnosis not present

## 2017-06-19 DIAGNOSIS — Z7902 Long term (current) use of antithrombotics/antiplatelets: Secondary | ICD-10-CM | POA: Diagnosis not present

## 2017-06-19 DIAGNOSIS — Z79891 Long term (current) use of opiate analgesic: Secondary | ICD-10-CM | POA: Diagnosis not present

## 2017-06-19 DIAGNOSIS — N201 Calculus of ureter: Secondary | ICD-10-CM | POA: Diagnosis not present

## 2017-06-19 DIAGNOSIS — I719 Aortic aneurysm of unspecified site, without rupture: Secondary | ICD-10-CM | POA: Diagnosis not present

## 2017-06-19 DIAGNOSIS — Z7982 Long term (current) use of aspirin: Secondary | ICD-10-CM | POA: Diagnosis not present

## 2017-06-19 DIAGNOSIS — I69351 Hemiplegia and hemiparesis following cerebral infarction affecting right dominant side: Secondary | ICD-10-CM | POA: Diagnosis not present

## 2017-06-21 DIAGNOSIS — Z7982 Long term (current) use of aspirin: Secondary | ICD-10-CM | POA: Diagnosis not present

## 2017-06-21 DIAGNOSIS — I2510 Atherosclerotic heart disease of native coronary artery without angina pectoris: Secondary | ICD-10-CM | POA: Diagnosis not present

## 2017-06-21 DIAGNOSIS — Z7902 Long term (current) use of antithrombotics/antiplatelets: Secondary | ICD-10-CM | POA: Diagnosis not present

## 2017-06-21 DIAGNOSIS — I719 Aortic aneurysm of unspecified site, without rupture: Secondary | ICD-10-CM | POA: Diagnosis not present

## 2017-06-21 DIAGNOSIS — Z79891 Long term (current) use of opiate analgesic: Secondary | ICD-10-CM | POA: Diagnosis not present

## 2017-06-21 DIAGNOSIS — I69351 Hemiplegia and hemiparesis following cerebral infarction affecting right dominant side: Secondary | ICD-10-CM | POA: Diagnosis not present

## 2017-06-21 DIAGNOSIS — N201 Calculus of ureter: Secondary | ICD-10-CM | POA: Diagnosis not present

## 2017-06-21 DIAGNOSIS — M549 Dorsalgia, unspecified: Secondary | ICD-10-CM | POA: Diagnosis not present

## 2017-06-22 ENCOUNTER — Ambulatory Visit: Payer: Medicare Other | Admitting: Physical Medicine & Rehabilitation

## 2017-06-23 DIAGNOSIS — R1310 Dysphagia, unspecified: Secondary | ICD-10-CM | POA: Diagnosis not present

## 2017-06-23 DIAGNOSIS — I69351 Hemiplegia and hemiparesis following cerebral infarction affecting right dominant side: Secondary | ICD-10-CM | POA: Diagnosis not present

## 2017-06-23 DIAGNOSIS — B9620 Unspecified Escherichia coli [E. coli] as the cause of diseases classified elsewhere: Secondary | ICD-10-CM | POA: Diagnosis not present

## 2017-06-23 DIAGNOSIS — I2510 Atherosclerotic heart disease of native coronary artery without angina pectoris: Secondary | ICD-10-CM | POA: Diagnosis not present

## 2017-06-23 DIAGNOSIS — I719 Aortic aneurysm of unspecified site, without rupture: Secondary | ICD-10-CM | POA: Diagnosis not present

## 2017-06-23 DIAGNOSIS — Z79891 Long term (current) use of opiate analgesic: Secondary | ICD-10-CM | POA: Diagnosis not present

## 2017-06-23 DIAGNOSIS — M549 Dorsalgia, unspecified: Secondary | ICD-10-CM | POA: Diagnosis not present

## 2017-06-23 DIAGNOSIS — N390 Urinary tract infection, site not specified: Secondary | ICD-10-CM | POA: Diagnosis not present

## 2017-06-23 DIAGNOSIS — I69391 Dysphagia following cerebral infarction: Secondary | ICD-10-CM | POA: Diagnosis not present

## 2017-06-23 DIAGNOSIS — Z7982 Long term (current) use of aspirin: Secondary | ICD-10-CM | POA: Diagnosis not present

## 2017-06-23 DIAGNOSIS — Z7902 Long term (current) use of antithrombotics/antiplatelets: Secondary | ICD-10-CM | POA: Diagnosis not present

## 2017-06-23 DIAGNOSIS — E785 Hyperlipidemia, unspecified: Secondary | ICD-10-CM | POA: Diagnosis not present

## 2017-06-25 DIAGNOSIS — I69351 Hemiplegia and hemiparesis following cerebral infarction affecting right dominant side: Secondary | ICD-10-CM | POA: Diagnosis not present

## 2017-06-25 DIAGNOSIS — M549 Dorsalgia, unspecified: Secondary | ICD-10-CM | POA: Diagnosis not present

## 2017-06-25 DIAGNOSIS — Z7982 Long term (current) use of aspirin: Secondary | ICD-10-CM | POA: Diagnosis not present

## 2017-06-25 DIAGNOSIS — Z79891 Long term (current) use of opiate analgesic: Secondary | ICD-10-CM | POA: Diagnosis not present

## 2017-06-25 DIAGNOSIS — I69391 Dysphagia following cerebral infarction: Secondary | ICD-10-CM | POA: Diagnosis not present

## 2017-06-25 DIAGNOSIS — I2510 Atherosclerotic heart disease of native coronary artery without angina pectoris: Secondary | ICD-10-CM | POA: Diagnosis not present

## 2017-06-25 DIAGNOSIS — R1310 Dysphagia, unspecified: Secondary | ICD-10-CM | POA: Diagnosis not present

## 2017-06-25 DIAGNOSIS — I719 Aortic aneurysm of unspecified site, without rupture: Secondary | ICD-10-CM | POA: Diagnosis not present

## 2017-06-25 DIAGNOSIS — E785 Hyperlipidemia, unspecified: Secondary | ICD-10-CM | POA: Diagnosis not present

## 2017-06-25 DIAGNOSIS — B9620 Unspecified Escherichia coli [E. coli] as the cause of diseases classified elsewhere: Secondary | ICD-10-CM | POA: Diagnosis not present

## 2017-06-25 DIAGNOSIS — Z7902 Long term (current) use of antithrombotics/antiplatelets: Secondary | ICD-10-CM | POA: Diagnosis not present

## 2017-06-25 DIAGNOSIS — N390 Urinary tract infection, site not specified: Secondary | ICD-10-CM | POA: Diagnosis not present

## 2017-06-28 DIAGNOSIS — E785 Hyperlipidemia, unspecified: Secondary | ICD-10-CM | POA: Diagnosis not present

## 2017-06-28 DIAGNOSIS — R1310 Dysphagia, unspecified: Secondary | ICD-10-CM | POA: Diagnosis not present

## 2017-06-28 DIAGNOSIS — Z7982 Long term (current) use of aspirin: Secondary | ICD-10-CM | POA: Diagnosis not present

## 2017-06-28 DIAGNOSIS — N390 Urinary tract infection, site not specified: Secondary | ICD-10-CM | POA: Diagnosis not present

## 2017-06-28 DIAGNOSIS — I719 Aortic aneurysm of unspecified site, without rupture: Secondary | ICD-10-CM | POA: Diagnosis not present

## 2017-06-28 DIAGNOSIS — I2510 Atherosclerotic heart disease of native coronary artery without angina pectoris: Secondary | ICD-10-CM | POA: Diagnosis not present

## 2017-06-28 DIAGNOSIS — M549 Dorsalgia, unspecified: Secondary | ICD-10-CM | POA: Diagnosis not present

## 2017-06-28 DIAGNOSIS — B9620 Unspecified Escherichia coli [E. coli] as the cause of diseases classified elsewhere: Secondary | ICD-10-CM | POA: Diagnosis not present

## 2017-06-28 DIAGNOSIS — Z7902 Long term (current) use of antithrombotics/antiplatelets: Secondary | ICD-10-CM | POA: Diagnosis not present

## 2017-06-28 DIAGNOSIS — I69391 Dysphagia following cerebral infarction: Secondary | ICD-10-CM | POA: Diagnosis not present

## 2017-06-28 DIAGNOSIS — I69351 Hemiplegia and hemiparesis following cerebral infarction affecting right dominant side: Secondary | ICD-10-CM | POA: Diagnosis not present

## 2017-06-28 DIAGNOSIS — Z79891 Long term (current) use of opiate analgesic: Secondary | ICD-10-CM | POA: Diagnosis not present

## 2017-07-03 ENCOUNTER — Ambulatory Visit: Payer: Medicare Other | Admitting: Neurology

## 2017-07-03 DIAGNOSIS — Z7982 Long term (current) use of aspirin: Secondary | ICD-10-CM | POA: Diagnosis not present

## 2017-07-03 DIAGNOSIS — E785 Hyperlipidemia, unspecified: Secondary | ICD-10-CM | POA: Diagnosis not present

## 2017-07-03 DIAGNOSIS — Z7902 Long term (current) use of antithrombotics/antiplatelets: Secondary | ICD-10-CM | POA: Diagnosis not present

## 2017-07-03 DIAGNOSIS — I2510 Atherosclerotic heart disease of native coronary artery without angina pectoris: Secondary | ICD-10-CM | POA: Diagnosis not present

## 2017-07-03 DIAGNOSIS — M549 Dorsalgia, unspecified: Secondary | ICD-10-CM | POA: Diagnosis not present

## 2017-07-03 DIAGNOSIS — B9620 Unspecified Escherichia coli [E. coli] as the cause of diseases classified elsewhere: Secondary | ICD-10-CM | POA: Diagnosis not present

## 2017-07-03 DIAGNOSIS — I719 Aortic aneurysm of unspecified site, without rupture: Secondary | ICD-10-CM | POA: Diagnosis not present

## 2017-07-03 DIAGNOSIS — I69391 Dysphagia following cerebral infarction: Secondary | ICD-10-CM | POA: Diagnosis not present

## 2017-07-03 DIAGNOSIS — R1310 Dysphagia, unspecified: Secondary | ICD-10-CM | POA: Diagnosis not present

## 2017-07-03 DIAGNOSIS — N390 Urinary tract infection, site not specified: Secondary | ICD-10-CM | POA: Diagnosis not present

## 2017-07-03 DIAGNOSIS — Z79891 Long term (current) use of opiate analgesic: Secondary | ICD-10-CM | POA: Diagnosis not present

## 2017-07-03 DIAGNOSIS — I69351 Hemiplegia and hemiparesis following cerebral infarction affecting right dominant side: Secondary | ICD-10-CM | POA: Diagnosis not present

## 2017-07-04 DIAGNOSIS — E785 Hyperlipidemia, unspecified: Secondary | ICD-10-CM | POA: Diagnosis not present

## 2017-07-04 DIAGNOSIS — B9620 Unspecified Escherichia coli [E. coli] as the cause of diseases classified elsewhere: Secondary | ICD-10-CM | POA: Diagnosis not present

## 2017-07-04 DIAGNOSIS — Z7982 Long term (current) use of aspirin: Secondary | ICD-10-CM | POA: Diagnosis not present

## 2017-07-04 DIAGNOSIS — Z7902 Long term (current) use of antithrombotics/antiplatelets: Secondary | ICD-10-CM | POA: Diagnosis not present

## 2017-07-04 DIAGNOSIS — R1310 Dysphagia, unspecified: Secondary | ICD-10-CM | POA: Diagnosis not present

## 2017-07-04 DIAGNOSIS — N390 Urinary tract infection, site not specified: Secondary | ICD-10-CM | POA: Diagnosis not present

## 2017-07-04 DIAGNOSIS — Z79891 Long term (current) use of opiate analgesic: Secondary | ICD-10-CM | POA: Diagnosis not present

## 2017-07-04 DIAGNOSIS — I2510 Atherosclerotic heart disease of native coronary artery without angina pectoris: Secondary | ICD-10-CM | POA: Diagnosis not present

## 2017-07-04 DIAGNOSIS — I719 Aortic aneurysm of unspecified site, without rupture: Secondary | ICD-10-CM | POA: Diagnosis not present

## 2017-07-04 DIAGNOSIS — I69391 Dysphagia following cerebral infarction: Secondary | ICD-10-CM | POA: Diagnosis not present

## 2017-07-04 DIAGNOSIS — M549 Dorsalgia, unspecified: Secondary | ICD-10-CM | POA: Diagnosis not present

## 2017-07-04 DIAGNOSIS — I69351 Hemiplegia and hemiparesis following cerebral infarction affecting right dominant side: Secondary | ICD-10-CM | POA: Diagnosis not present

## 2017-07-05 DIAGNOSIS — R1310 Dysphagia, unspecified: Secondary | ICD-10-CM | POA: Diagnosis not present

## 2017-07-05 DIAGNOSIS — N390 Urinary tract infection, site not specified: Secondary | ICD-10-CM | POA: Diagnosis not present

## 2017-07-05 DIAGNOSIS — M549 Dorsalgia, unspecified: Secondary | ICD-10-CM | POA: Diagnosis not present

## 2017-07-05 DIAGNOSIS — Z7982 Long term (current) use of aspirin: Secondary | ICD-10-CM | POA: Diagnosis not present

## 2017-07-05 DIAGNOSIS — I69351 Hemiplegia and hemiparesis following cerebral infarction affecting right dominant side: Secondary | ICD-10-CM | POA: Diagnosis not present

## 2017-07-05 DIAGNOSIS — B9620 Unspecified Escherichia coli [E. coli] as the cause of diseases classified elsewhere: Secondary | ICD-10-CM | POA: Diagnosis not present

## 2017-07-05 DIAGNOSIS — Z7902 Long term (current) use of antithrombotics/antiplatelets: Secondary | ICD-10-CM | POA: Diagnosis not present

## 2017-07-05 DIAGNOSIS — E785 Hyperlipidemia, unspecified: Secondary | ICD-10-CM | POA: Diagnosis not present

## 2017-07-05 DIAGNOSIS — I2510 Atherosclerotic heart disease of native coronary artery without angina pectoris: Secondary | ICD-10-CM | POA: Diagnosis not present

## 2017-07-05 DIAGNOSIS — I719 Aortic aneurysm of unspecified site, without rupture: Secondary | ICD-10-CM | POA: Diagnosis not present

## 2017-07-05 DIAGNOSIS — I69391 Dysphagia following cerebral infarction: Secondary | ICD-10-CM | POA: Diagnosis not present

## 2017-07-05 DIAGNOSIS — Z79891 Long term (current) use of opiate analgesic: Secondary | ICD-10-CM | POA: Diagnosis not present

## 2017-07-06 DIAGNOSIS — M549 Dorsalgia, unspecified: Secondary | ICD-10-CM | POA: Diagnosis not present

## 2017-07-06 DIAGNOSIS — Z79891 Long term (current) use of opiate analgesic: Secondary | ICD-10-CM | POA: Diagnosis not present

## 2017-07-06 DIAGNOSIS — I69391 Dysphagia following cerebral infarction: Secondary | ICD-10-CM | POA: Diagnosis not present

## 2017-07-06 DIAGNOSIS — R1310 Dysphagia, unspecified: Secondary | ICD-10-CM | POA: Diagnosis not present

## 2017-07-06 DIAGNOSIS — B9620 Unspecified Escherichia coli [E. coli] as the cause of diseases classified elsewhere: Secondary | ICD-10-CM | POA: Diagnosis not present

## 2017-07-06 DIAGNOSIS — I2510 Atherosclerotic heart disease of native coronary artery without angina pectoris: Secondary | ICD-10-CM | POA: Diagnosis not present

## 2017-07-06 DIAGNOSIS — I719 Aortic aneurysm of unspecified site, without rupture: Secondary | ICD-10-CM | POA: Diagnosis not present

## 2017-07-06 DIAGNOSIS — N390 Urinary tract infection, site not specified: Secondary | ICD-10-CM | POA: Diagnosis not present

## 2017-07-06 DIAGNOSIS — Z7902 Long term (current) use of antithrombotics/antiplatelets: Secondary | ICD-10-CM | POA: Diagnosis not present

## 2017-07-06 DIAGNOSIS — Z7982 Long term (current) use of aspirin: Secondary | ICD-10-CM | POA: Diagnosis not present

## 2017-07-06 DIAGNOSIS — E785 Hyperlipidemia, unspecified: Secondary | ICD-10-CM | POA: Diagnosis not present

## 2017-07-06 DIAGNOSIS — I69351 Hemiplegia and hemiparesis following cerebral infarction affecting right dominant side: Secondary | ICD-10-CM | POA: Diagnosis not present

## 2017-07-07 DIAGNOSIS — I2510 Atherosclerotic heart disease of native coronary artery without angina pectoris: Secondary | ICD-10-CM | POA: Diagnosis not present

## 2017-07-07 DIAGNOSIS — I69391 Dysphagia following cerebral infarction: Secondary | ICD-10-CM | POA: Diagnosis not present

## 2017-07-07 DIAGNOSIS — Z7902 Long term (current) use of antithrombotics/antiplatelets: Secondary | ICD-10-CM | POA: Diagnosis not present

## 2017-07-07 DIAGNOSIS — E785 Hyperlipidemia, unspecified: Secondary | ICD-10-CM | POA: Diagnosis not present

## 2017-07-07 DIAGNOSIS — Z79891 Long term (current) use of opiate analgesic: Secondary | ICD-10-CM | POA: Diagnosis not present

## 2017-07-07 DIAGNOSIS — R1310 Dysphagia, unspecified: Secondary | ICD-10-CM | POA: Diagnosis not present

## 2017-07-07 DIAGNOSIS — I719 Aortic aneurysm of unspecified site, without rupture: Secondary | ICD-10-CM | POA: Diagnosis not present

## 2017-07-07 DIAGNOSIS — B9620 Unspecified Escherichia coli [E. coli] as the cause of diseases classified elsewhere: Secondary | ICD-10-CM | POA: Diagnosis not present

## 2017-07-07 DIAGNOSIS — N390 Urinary tract infection, site not specified: Secondary | ICD-10-CM | POA: Diagnosis not present

## 2017-07-07 DIAGNOSIS — M549 Dorsalgia, unspecified: Secondary | ICD-10-CM | POA: Diagnosis not present

## 2017-07-07 DIAGNOSIS — Z7982 Long term (current) use of aspirin: Secondary | ICD-10-CM | POA: Diagnosis not present

## 2017-07-07 DIAGNOSIS — I69351 Hemiplegia and hemiparesis following cerebral infarction affecting right dominant side: Secondary | ICD-10-CM | POA: Diagnosis not present

## 2017-07-10 DIAGNOSIS — I719 Aortic aneurysm of unspecified site, without rupture: Secondary | ICD-10-CM | POA: Diagnosis not present

## 2017-07-10 DIAGNOSIS — I69351 Hemiplegia and hemiparesis following cerebral infarction affecting right dominant side: Secondary | ICD-10-CM | POA: Diagnosis not present

## 2017-07-10 DIAGNOSIS — Z7902 Long term (current) use of antithrombotics/antiplatelets: Secondary | ICD-10-CM | POA: Diagnosis not present

## 2017-07-10 DIAGNOSIS — I69391 Dysphagia following cerebral infarction: Secondary | ICD-10-CM | POA: Diagnosis not present

## 2017-07-10 DIAGNOSIS — E785 Hyperlipidemia, unspecified: Secondary | ICD-10-CM | POA: Diagnosis not present

## 2017-07-10 DIAGNOSIS — Z7982 Long term (current) use of aspirin: Secondary | ICD-10-CM | POA: Diagnosis not present

## 2017-07-10 DIAGNOSIS — B9620 Unspecified Escherichia coli [E. coli] as the cause of diseases classified elsewhere: Secondary | ICD-10-CM | POA: Diagnosis not present

## 2017-07-10 DIAGNOSIS — Z79891 Long term (current) use of opiate analgesic: Secondary | ICD-10-CM | POA: Diagnosis not present

## 2017-07-10 DIAGNOSIS — N390 Urinary tract infection, site not specified: Secondary | ICD-10-CM | POA: Diagnosis not present

## 2017-07-10 DIAGNOSIS — M549 Dorsalgia, unspecified: Secondary | ICD-10-CM | POA: Diagnosis not present

## 2017-07-10 DIAGNOSIS — I2510 Atherosclerotic heart disease of native coronary artery without angina pectoris: Secondary | ICD-10-CM | POA: Diagnosis not present

## 2017-07-10 DIAGNOSIS — R1310 Dysphagia, unspecified: Secondary | ICD-10-CM | POA: Diagnosis not present

## 2017-07-10 NOTE — Patient Instructions (Addendum)
Susanne GreenhouseDouglas B Rosello  07/10/2017   Your procedure is scheduled on: 07-17-17   [facility]  Entrance [place] Elevators to 3rd floor to  Short Stay Center at 9:30 AM.    Call this number if you have problems the morning of surgery [phone]    Remember: ONLY 1 PERSON MAY GO WITH YOU TO SHORT STAY TO GET  READY MORNING OF YOUR SURGERY.  Do not eat food or drink liquids :After Midnight.     Take these medicines the morning of surgery with A SIP OF WATER: Metoprolol Tartrate (Lopressor)                                You may not have any metal on your body including hair pins and              piercings  Do not wear jewelry, lotions, powders or deodorant             Men may shave face and neck.   Do not bring valuables to the hospital. [facility] IS NOT             RESPONSIBLE   FOR VALUABLES.  Contacts, dentures or bridgework may not be worn into surgery.      Patients discharged the day of surgery will not be allowed to drive home.  Name and phone number of your driver: Son-Doug [phone]               Please read over the following fact sheets you were given: _____________________________________________________________________             [facility] - Preparing for [facility] Before surgery, you can play an important role.  Because skin is not sterile, your skin needs to be as free of germs as possible.  You can reduce the number of germs on your skin by washing with CHG (chlorahexidine gluconate) soap before surgery.  CHG is an antiseptic cleaner which kills germs and bonds with the skin to continue killing germs even after washing. Please DO NOT use if you have an allergy to CHG or antibacterial soaps.  If your skin becomes reddened/irritated stop using the CHG and inform your nurse when you arrive at Short Stay. Do not shave (including legs and underarms) for at least 48 hours prior to the first CHG shower.  You may shave your  face/neck. Please follow these instructions carefully:  1.  Shower with CHG Soap the night before surgery and the  morning of Surgery.  2.  If you choose to wash your hair, wash your hair first as usual with your  normal  shampoo.  3.  After you shampoo, rinse your hair and body thoroughly to remove the  shampoo.                           4.  Use CHG as you would any other liquid soap.  You can apply chg directly  to the skin and wash                       Gently with a scrungie or clean washcloth.  5.  Apply the CHG Soap to your body ONLY FROM THE NECK DOWN.   Do not use on face/  open                           Wound or open sores. Avoid contact with eyes, ears mouth and genitals (private parts).                       Wash face,  Genitals (private parts) with your normal soap.             6.  Wash thoroughly, paying special attention to the area where your surgery  will be performed.  7.  Thoroughly rinse your body with warm water from the neck down.  8.  DO NOT shower/wash with your normal soap after using and rinsing off  the CHG Soap.                9.  Pat yourself dry with a clean towel.            10.  Wear clean pajamas.            11.  Place clean sheets on your bed the night of your first shower and do not  sleep with pets. Day of Surgery : Do not apply any lotions/deodorants the morning of surgery.  Please wear clean clothes to the hospital/surgery center.  FAILURE TO FOLLOW THESE INSTRUCTIONS MAY RESULT IN THE CANCELLATION OF YOUR SURGERY PATIENT SIGNATURE_________________________________  NURSE SIGNATURE__________________________________  ________________________________________________________________________

## 2017-07-10 NOTE — Progress Notes (Signed)
05-18-17 (Epic) EKG  04-27-17 (Epic) CXR  03-20-17 (Epic) ECHO

## 2017-07-11 DIAGNOSIS — I69391 Dysphagia following cerebral infarction: Secondary | ICD-10-CM | POA: Diagnosis not present

## 2017-07-11 DIAGNOSIS — I69351 Hemiplegia and hemiparesis following cerebral infarction affecting right dominant side: Secondary | ICD-10-CM | POA: Diagnosis not present

## 2017-07-11 DIAGNOSIS — B9620 Unspecified Escherichia coli [E. coli] as the cause of diseases classified elsewhere: Secondary | ICD-10-CM | POA: Diagnosis not present

## 2017-07-11 DIAGNOSIS — I2510 Atherosclerotic heart disease of native coronary artery without angina pectoris: Secondary | ICD-10-CM | POA: Diagnosis not present

## 2017-07-11 DIAGNOSIS — Z79891 Long term (current) use of opiate analgesic: Secondary | ICD-10-CM | POA: Diagnosis not present

## 2017-07-11 DIAGNOSIS — I719 Aortic aneurysm of unspecified site, without rupture: Secondary | ICD-10-CM | POA: Diagnosis not present

## 2017-07-11 DIAGNOSIS — E785 Hyperlipidemia, unspecified: Secondary | ICD-10-CM | POA: Diagnosis not present

## 2017-07-11 DIAGNOSIS — N201 Calculus of ureter: Secondary | ICD-10-CM | POA: Diagnosis not present

## 2017-07-11 DIAGNOSIS — M549 Dorsalgia, unspecified: Secondary | ICD-10-CM | POA: Diagnosis not present

## 2017-07-11 DIAGNOSIS — Z7902 Long term (current) use of antithrombotics/antiplatelets: Secondary | ICD-10-CM | POA: Diagnosis not present

## 2017-07-11 DIAGNOSIS — N390 Urinary tract infection, site not specified: Secondary | ICD-10-CM | POA: Diagnosis not present

## 2017-07-11 DIAGNOSIS — R1310 Dysphagia, unspecified: Secondary | ICD-10-CM | POA: Diagnosis not present

## 2017-07-11 DIAGNOSIS — Z7982 Long term (current) use of aspirin: Secondary | ICD-10-CM | POA: Diagnosis not present

## 2017-07-11 DIAGNOSIS — I639 Cerebral infarction, unspecified: Secondary | ICD-10-CM | POA: Diagnosis not present

## 2017-07-12 ENCOUNTER — Encounter (HOSPITAL_COMMUNITY): Payer: Self-pay

## 2017-07-12 ENCOUNTER — Other Ambulatory Visit: Payer: Self-pay

## 2017-07-12 ENCOUNTER — Encounter (HOSPITAL_COMMUNITY)
Admission: RE | Admit: 2017-07-12 | Discharge: 2017-07-12 | Disposition: A | Discharge status: Home / Self Care | Payer: Medicare Other | Source: Ambulatory Visit | Attending: Urology | Admitting: Urology

## 2017-07-12 DIAGNOSIS — N201 Calculus of ureter: Secondary | ICD-10-CM | POA: Diagnosis not present

## 2017-07-12 DIAGNOSIS — Z01812 Encounter for preprocedural laboratory examination: Secondary | ICD-10-CM | POA: Insufficient documentation

## 2017-07-12 HISTORY — DX: Essential (primary) hypertension: I10

## 2017-07-12 LAB — CBC
HCT: 42.1 % (ref 39.0–52.0)
HEMOGLOBIN: 14 g/dL (ref 13.0–17.0)
MCH: 30.8 pg (ref 26.0–34.0)
MCHC: 33.3 g/dL (ref 30.0–36.0)
MCV: 92.7 fL (ref 78.0–100.0)
PLATELETS: 311 10*3/uL (ref 150–400)
RBC: 4.54 MIL/uL (ref 4.22–5.81)
RDW: 12.9 % (ref 11.5–15.5)
WBC: 11.9 10*3/uL — AB (ref 4.0–10.5)

## 2017-07-12 LAB — BASIC METABOLIC PANEL
ANION GAP: 9 (ref 5–15)
BUN: 20 mg/dL (ref 6–20)
CHLORIDE: 108 mmol/L (ref 101–111)
CO2: 27 mmol/L (ref 22–32)
CREATININE: 1.05 mg/dL (ref 0.61–1.24)
Calcium: 9.5 mg/dL (ref 8.9–10.3)
GFR calc non Af Amer: >60 mL/min (ref >60)
Glucose, Bld: 103 mg/dL — ABNORMAL HIGH (ref 65–99)
Potassium: 3.7 mmol/L (ref 3.5–5.1)
SODIUM: 144 mmol/L (ref 135–145)

## 2017-07-13 DIAGNOSIS — I69351 Hemiplegia and hemiparesis following cerebral infarction affecting right dominant side: Secondary | ICD-10-CM | POA: Diagnosis not present

## 2017-07-13 DIAGNOSIS — I2510 Atherosclerotic heart disease of native coronary artery without angina pectoris: Secondary | ICD-10-CM | POA: Diagnosis not present

## 2017-07-13 DIAGNOSIS — E785 Hyperlipidemia, unspecified: Secondary | ICD-10-CM | POA: Diagnosis not present

## 2017-07-13 DIAGNOSIS — B9620 Unspecified Escherichia coli [E. coli] as the cause of diseases classified elsewhere: Secondary | ICD-10-CM | POA: Diagnosis not present

## 2017-07-13 DIAGNOSIS — Z7902 Long term (current) use of antithrombotics/antiplatelets: Secondary | ICD-10-CM | POA: Diagnosis not present

## 2017-07-13 DIAGNOSIS — Z7982 Long term (current) use of aspirin: Secondary | ICD-10-CM | POA: Diagnosis not present

## 2017-07-13 DIAGNOSIS — N390 Urinary tract infection, site not specified: Secondary | ICD-10-CM | POA: Diagnosis not present

## 2017-07-13 DIAGNOSIS — Z79891 Long term (current) use of opiate analgesic: Secondary | ICD-10-CM | POA: Diagnosis not present

## 2017-07-13 DIAGNOSIS — R1310 Dysphagia, unspecified: Secondary | ICD-10-CM | POA: Diagnosis not present

## 2017-07-13 DIAGNOSIS — M549 Dorsalgia, unspecified: Secondary | ICD-10-CM | POA: Diagnosis not present

## 2017-07-13 DIAGNOSIS — I69391 Dysphagia following cerebral infarction: Secondary | ICD-10-CM | POA: Diagnosis not present

## 2017-07-13 DIAGNOSIS — I719 Aortic aneurysm of unspecified site, without rupture: Secondary | ICD-10-CM | POA: Diagnosis not present

## 2017-07-14 DIAGNOSIS — E785 Hyperlipidemia, unspecified: Secondary | ICD-10-CM | POA: Diagnosis not present

## 2017-07-14 DIAGNOSIS — I2510 Atherosclerotic heart disease of native coronary artery without angina pectoris: Secondary | ICD-10-CM | POA: Diagnosis not present

## 2017-07-14 DIAGNOSIS — Z79891 Long term (current) use of opiate analgesic: Secondary | ICD-10-CM | POA: Diagnosis not present

## 2017-07-14 DIAGNOSIS — Z7902 Long term (current) use of antithrombotics/antiplatelets: Secondary | ICD-10-CM | POA: Diagnosis not present

## 2017-07-14 DIAGNOSIS — I69391 Dysphagia following cerebral infarction: Secondary | ICD-10-CM | POA: Diagnosis not present

## 2017-07-14 DIAGNOSIS — I69351 Hemiplegia and hemiparesis following cerebral infarction affecting right dominant side: Secondary | ICD-10-CM | POA: Diagnosis not present

## 2017-07-14 DIAGNOSIS — Z7982 Long term (current) use of aspirin: Secondary | ICD-10-CM | POA: Diagnosis not present

## 2017-07-14 DIAGNOSIS — M549 Dorsalgia, unspecified: Secondary | ICD-10-CM | POA: Diagnosis not present

## 2017-07-14 DIAGNOSIS — I719 Aortic aneurysm of unspecified site, without rupture: Secondary | ICD-10-CM | POA: Diagnosis not present

## 2017-07-14 DIAGNOSIS — N390 Urinary tract infection, site not specified: Secondary | ICD-10-CM | POA: Diagnosis not present

## 2017-07-14 DIAGNOSIS — B9620 Unspecified Escherichia coli [E. coli] as the cause of diseases classified elsewhere: Secondary | ICD-10-CM | POA: Diagnosis not present

## 2017-07-14 DIAGNOSIS — R1310 Dysphagia, unspecified: Secondary | ICD-10-CM | POA: Diagnosis not present

## 2017-07-17 ENCOUNTER — Ambulatory Visit (HOSPITAL_COMMUNITY): Payer: Medicare Other

## 2017-07-17 ENCOUNTER — Ambulatory Visit (HOSPITAL_COMMUNITY): Payer: Medicare Other | Admitting: Certified Registered"

## 2017-07-17 ENCOUNTER — Encounter (HOSPITAL_COMMUNITY): Payer: Self-pay

## 2017-07-17 ENCOUNTER — Encounter (HOSPITAL_COMMUNITY)
Admission: RE | Disposition: A | Discharge status: Home / Self Care | Payer: Self-pay | Source: Ambulatory Visit | Attending: Urology

## 2017-07-17 ENCOUNTER — Ambulatory Visit (HOSPITAL_COMMUNITY)
Admission: RE | Admit: 2017-07-17 | Discharge: 2017-07-17 | Disposition: A | Discharge status: Home / Self Care | Payer: Medicare Other | Source: Ambulatory Visit | Attending: Urology | Admitting: Urology

## 2017-07-17 DIAGNOSIS — I10 Essential (primary) hypertension: Secondary | ICD-10-CM | POA: Insufficient documentation

## 2017-07-17 DIAGNOSIS — F419 Anxiety disorder, unspecified: Secondary | ICD-10-CM | POA: Insufficient documentation

## 2017-07-17 DIAGNOSIS — Z87442 Personal history of urinary calculi: Secondary | ICD-10-CM | POA: Insufficient documentation

## 2017-07-17 DIAGNOSIS — I739 Peripheral vascular disease, unspecified: Secondary | ICD-10-CM | POA: Diagnosis not present

## 2017-07-17 DIAGNOSIS — N1330 Unspecified hydronephrosis: Secondary | ICD-10-CM

## 2017-07-17 DIAGNOSIS — Z8673 Personal history of transient ischemic attack (TIA), and cerebral infarction without residual deficits: Secondary | ICD-10-CM | POA: Diagnosis not present

## 2017-07-17 DIAGNOSIS — Z87891 Personal history of nicotine dependence: Secondary | ICD-10-CM | POA: Insufficient documentation

## 2017-07-17 DIAGNOSIS — M1990 Unspecified osteoarthritis, unspecified site: Secondary | ICD-10-CM | POA: Diagnosis not present

## 2017-07-17 DIAGNOSIS — N201 Calculus of ureter: Secondary | ICD-10-CM | POA: Insufficient documentation

## 2017-07-17 DIAGNOSIS — G8929 Other chronic pain: Secondary | ICD-10-CM | POA: Insufficient documentation

## 2017-07-17 DIAGNOSIS — N132 Hydronephrosis with renal and ureteral calculous obstruction: Secondary | ICD-10-CM | POA: Diagnosis not present

## 2017-07-17 HISTORY — PX: CYSTOSCOPY/RETROGRADE/URETEROSCOPY: SHX5316

## 2017-07-17 HISTORY — PX: HOLMIUM LASER APPLICATION: SHX5852

## 2017-07-17 SURGERY — CYSTOSCOPY/RETROGRADE/URETEROSCOPY
Anesthesia: General | Site: Ureter | Laterality: Left

## 2017-07-17 MED ORDER — ONDANSETRON HCL 4 MG/2ML IJ SOLN
INTRAMUSCULAR | Status: AC
  Filled 2017-07-17: qty 2

## 2017-07-17 MED ORDER — CEFAZOLIN SODIUM-DEXTROSE 2-4 GM/100ML-% IV SOLN
INTRAVENOUS | Status: AC
  Filled 2017-07-17: qty 100

## 2017-07-17 MED ORDER — EPHEDRINE 5 MG/ML INJ
INTRAVENOUS | Status: AC
  Filled 2017-07-17: qty 10

## 2017-07-17 MED ORDER — DEXAMETHASONE SODIUM PHOSPHATE 10 MG/ML IJ SOLN
INTRAMUSCULAR | Status: DC | PRN
  Administered 2017-07-17: 10 mg via INTRAVENOUS

## 2017-07-17 MED ORDER — HYDROMORPHONE HCL 1 MG/ML IJ SOLN
INTRAMUSCULAR | Status: AC
  Filled 2017-07-17: qty 1

## 2017-07-17 MED ORDER — OXYCODONE HCL 5 MG PO TABS: 5.0000 mg | ORAL_TABLET | Freq: Once | ORAL | Status: DC | PRN

## 2017-07-17 MED ORDER — OXYCODONE-ACETAMINOPHEN 7.5-325 MG PO TABS: 1.0000 | ORAL_TABLET | ORAL | 0 refills | Status: DC | PRN

## 2017-07-17 MED ORDER — FENTANYL CITRATE (PF) 100 MCG/2ML IJ SOLN
INTRAMUSCULAR | Status: DC | PRN
  Administered 2017-07-17 (×2): 50 ug via INTRAVENOUS

## 2017-07-17 MED ORDER — ONDANSETRON HCL 4 MG/2ML IJ SOLN
INTRAMUSCULAR | Status: DC | PRN
  Administered 2017-07-17: 4 mg via INTRAVENOUS

## 2017-07-17 MED ORDER — HYDROMORPHONE HCL 1 MG/ML IJ SOLN
0.2500 mg | INTRAMUSCULAR | Status: DC | PRN
  Administered 2017-07-17 (×4): 0.5 mg via INTRAVENOUS

## 2017-07-17 MED ORDER — METOPROLOL TARTRATE 25 MG PO TABS
25.0000 mg | ORAL_TABLET | ORAL | Status: AC
  Administered 2017-07-17: 25 mg via ORAL
  Filled 2017-07-17: qty 1

## 2017-07-17 MED ORDER — LIDOCAINE 2% (20 MG/ML) 5 ML SYRINGE
INTRAMUSCULAR | Status: AC
  Filled 2017-07-17: qty 5

## 2017-07-17 MED ORDER — LACTATED RINGERS IV SOLN
INTRAVENOUS | Status: DC
  Administered 2017-07-17: 09:00:00 via INTRAVENOUS

## 2017-07-17 MED ORDER — MIDAZOLAM HCL 2 MG/2ML IJ SOLN
INTRAMUSCULAR | Status: AC
  Filled 2017-07-17: qty 2

## 2017-07-17 MED ORDER — IOHEXOL 300 MG/ML  SOLN
INTRAMUSCULAR | Status: DC | PRN
  Administered 2017-07-17: 5 mL via URETHRAL

## 2017-07-17 MED ORDER — MIDAZOLAM HCL 5 MG/5ML IJ SOLN
INTRAMUSCULAR | Status: DC | PRN
  Administered 2017-07-17: 2 mg via INTRAVENOUS

## 2017-07-17 MED ORDER — CEFAZOLIN SODIUM-DEXTROSE 2-4 GM/100ML-% IV SOLN
2.0000 g | INTRAVENOUS | Status: AC
  Administered 2017-07-17: 2 g via INTRAVENOUS

## 2017-07-17 MED ORDER — OXYCODONE-ACETAMINOPHEN 7.5-325 MG PO TABS
1.0000 | ORAL_TABLET | ORAL | Status: AC
  Administered 2017-07-17: 1 via ORAL
  Filled 2017-07-17: qty 1

## 2017-07-17 MED ORDER — OXYCODONE HCL 5 MG/5ML PO SOLN
5.0000 mg | Freq: Once | ORAL | Status: DC | PRN
  Filled 2017-07-17: qty 5

## 2017-07-17 MED ORDER — PHENYLEPHRINE 40 MCG/ML (10ML) SYRINGE FOR IV PUSH (FOR BLOOD PRESSURE SUPPORT)
PREFILLED_SYRINGE | INTRAVENOUS | Status: DC | PRN
  Administered 2017-07-17 (×2): 80 ug via INTRAVENOUS

## 2017-07-17 MED ORDER — TAMSULOSIN HCL 0.4 MG PO CAPS: 0.4000 mg | ORAL_CAPSULE | Freq: Every day | ORAL | 0 refills | Status: DC

## 2017-07-17 MED ORDER — PROPOFOL 10 MG/ML IV BOLUS
INTRAVENOUS | Status: AC
  Filled 2017-07-17: qty 20

## 2017-07-17 MED ORDER — PROPOFOL 10 MG/ML IV BOLUS
INTRAVENOUS | Status: DC | PRN
  Administered 2017-07-17: 20 mg via INTRAVENOUS
  Administered 2017-07-17: 150 mg via INTRAVENOUS

## 2017-07-17 MED ORDER — FENTANYL CITRATE (PF) 100 MCG/2ML IJ SOLN
INTRAMUSCULAR | Status: AC
  Filled 2017-07-17: qty 2

## 2017-07-17 MED ORDER — DEXAMETHASONE SODIUM PHOSPHATE 10 MG/ML IJ SOLN
INTRAMUSCULAR | Status: AC
  Filled 2017-07-17: qty 1

## 2017-07-17 MED ORDER — EPHEDRINE SULFATE-NACL 50-0.9 MG/10ML-% IV SOSY
PREFILLED_SYRINGE | INTRAVENOUS | Status: DC | PRN
  Administered 2017-07-17: 10 mg via INTRAVENOUS

## 2017-07-17 MED ORDER — PHENYLEPHRINE 40 MCG/ML (10ML) SYRINGE FOR IV PUSH (FOR BLOOD PRESSURE SUPPORT)
PREFILLED_SYRINGE | INTRAVENOUS | Status: AC
  Filled 2017-07-17: qty 10

## 2017-07-17 MED ORDER — LIDOCAINE 2% (20 MG/ML) 5 ML SYRINGE
INTRAMUSCULAR | Status: DC | PRN
  Administered 2017-07-17: 100 mg via INTRAVENOUS

## 2017-07-17 MED ORDER — PROMETHAZINE HCL 25 MG/ML IJ SOLN: 6.2500 mg | INTRAMUSCULAR | Status: DC | PRN

## 2017-07-17 SURGICAL SUPPLY — 24 items
BAG URO CATCHER STRL LF (MISCELLANEOUS) ×3 IMPLANT
CATH INTERMIT  6FR 70CM (CATHETERS) ×3 IMPLANT
CLOTH BEACON ORANGE TIMEOUT ST (SAFETY) ×3 IMPLANT
COVER FOOTSWITCH UNIV (MISCELLANEOUS) IMPLANT
COVER SURGICAL LIGHT HANDLE (MISCELLANEOUS) ×3 IMPLANT
EXTRACTOR STONE NITINOL NGAGE (UROLOGICAL SUPPLIES) ×3 IMPLANT
FIBER LASER FLEXIVA 1000 (UROLOGICAL SUPPLIES) IMPLANT
FIBER LASER FLEXIVA 365 (UROLOGICAL SUPPLIES) IMPLANT
FIBER LASER FLEXIVA 550 (UROLOGICAL SUPPLIES) IMPLANT
FIBER LASER TRAC TIP (UROLOGICAL SUPPLIES) ×3 IMPLANT
GLOVE BIO SURGEON STRL SZ8 (GLOVE) ×3 IMPLANT
GOWN STRL REUS W/TWL XL LVL3 (GOWN DISPOSABLE) ×3 IMPLANT
GUIDEWIRE ANG ZIPWIRE 038X150 (WIRE) ×3 IMPLANT
GUIDEWIRE STR DUAL SENSOR (WIRE) IMPLANT
IV NS 1000ML (IV SOLUTION) ×2
IV NS 1000ML BAXH (IV SOLUTION) ×1 IMPLANT
MANIFOLD NEPTUNE II (INSTRUMENTS) ×3 IMPLANT
PACK CYSTO (CUSTOM PROCEDURE TRAY) ×3 IMPLANT
SHEATH ACCESS URETERAL 24CM (SHEATH) ×3 IMPLANT
SHEATH URETERAL 12FRX35CM (MISCELLANEOUS) IMPLANT
STENT CONTOUR 6FRX26X.038 (STENTS) ×3 IMPLANT
TUBE FEEDING 8FR 16IN STR KANG (MISCELLANEOUS) ×3 IMPLANT
TUBING CONNECTING 10 (TUBING) ×2 IMPLANT
TUBING CONNECTING 10' (TUBING) ×1

## 2017-07-17 NOTE — H&P (Signed)
Urology Admission H&P  Chief Complaint: left ureteral calculus  History of Present Illness: [name] is a 61yo with a hx of left ureteral calculus s/p Charles ureteral stent placement. He is here for stone extraction  Past Medical History:  Diagnosis Date  . Anxiety   . Chronic back pain   . CVA (cerebral vascular accident) (HCC) 03/20/2017  . Hepatitis C    HEP C, never treated, 2007  . Hypertension    Past Surgical History:  Procedure Laterality Date  . CERVICAL DISC SURGERY  2001   anterior  . COLONOSCOPY WITH PROPOFOL N/A 03/14/2017   Procedure: COLONOSCOPY WITH PROPOFOL;  Surgeon: Brendan BaliFields, Sandi L, MD;  [facility];  Service: Endoscopy;  Laterality: N/A;  8:15am  . CYSTOSCOPY W/ URETERAL STENT PLACEMENT Left 03/23/2017   Procedure: CYSTOSCOPY WITH LEFT RETROGRADE PYELOGRAM/URETERAL LEFT STENT PLACEMENT;  Surgeon: Brendan GauzeMcKenzie, Brendan L, MD;  [facility];  Service: Urology;  Laterality: Left;  . LUMBAR SPINE SURGERY  2015  . POLYPECTOMY  03/14/2017   Procedure: POLYPECTOMY;  Surgeon: Brendan BaliFields, Sandi L, MD;  [facility];  Service: Endoscopy;;  rectum    Home Medications:  Current Facility-Administered Medications  Medication Dose Route Frequency Provider Last Rate Last Dose  . ceFAZolin (ANCEF) IVPB 2g/100 mL premix  2 g Intravenous 30 min Pre-Op Charles, Mardene CelestePatrick L, MD      . lactated ringers infusion   Intravenous Continuous Brendan BinningMcKenzie, Mardene CelestePatrick L, MD 10 mL/hr at 07/17/17 0915     Allergies:  Allergies  Allergen Reactions  . Codeine Itching and Nausea Only    Family History  Problem Relation Age of Onset  . Cancer Mother   . Colon cancer Neg Hx    Social History:  reports that he quit smoking about 4 months ago. His smoking use included cigarettes. He has a 22.50 pack-year smoking history. he has never used smokeless tobacco. He reports that he does not drink alcohol or use drugs.  Review of Systems  All other systems reviewed and are  negative.   Physical Exam:  Vital signs in last 24 hours: Temp:  [97.7 F (36.5 C)] 97.7 F (36.5 C) (12/10 0843) Pulse Rate:  [83] 83 (12/10 0843) Resp:  [18] 18 (12/10 0843) BP: (163)/(84) 163/84 (12/10 0843) SpO2:  [100 %] 100 % (12/10 0843) Weight:  [65.8 kg (145 lb 2 oz)] 65.8 kg (145 lb 2 oz) (12/10 16100843) Physical Exam  Constitutional: He is oriented to person, place, and time. He appears well-developed and well-nourished.  HENT:  Head: Normocephalic and atraumatic.  Eyes: EOM are normal. Pupils are equal, round, and reactive to light.  Neck: Normal range of motion. No thyromegaly present.  Cardiovascular: Normal rate and regular rhythm.  Respiratory: Effort normal. No respiratory distress.  GI: Soft. He exhibits no distension.  Musculoskeletal: Normal range of motion. He exhibits no edema.  Neurological: He is alert and oriented to person, place, and time.  Skin: Skin is warm and dry.  Psychiatric: He has a normal mood and affect. His behavior is normal. Judgment and thought content normal.    Laboratory Data:  No results found for this or any previous visit (from the past 24 hour(s)). No results found for this or any previous visit (from the past 240 hour(s)). Creatinine: Recent Labs    07/12/17 1130  CREATININE 1.05   Baseline Creatinine: 1  Impression/Assessment:  61yo with left ureteral calculus  Plan:  The risks/benefits/alternatives to left ureteroscopic stone extraction  was explained to the patient and he understands and wishes to proceed with surgery  Brendan Charles 07/17/2017, 11:15 AM

## 2017-07-17 NOTE — Transfer of Care (Signed)
Immediate Anesthesia Transfer of Care Note  Patient: Brendan Charles  Procedure(s) Performed: CYSTOSCOPY/RETROGRADE/URETEROSCOPY, stent removal, stone extraction, stent replacement (Left Ureter) HOLMIUM LASER APPLICATION (Left Ureter)  Patient Location: PACU  Anesthesia Type:General  Level of Consciousness: awake, alert  and oriented  Airway & Oxygen Therapy: Patient Spontanous Breathing and Patient connected to face mask oxygen  Post-op Assessment: Report given to RN and Post -op Vital signs reviewed and stable  Post vital signs: Reviewed and stable  Last Vitals:  Vitals:   07/17/17 0843  BP: (!) 163/84  Pulse: 83  Resp: 18  Temp: 36.5 C  SpO2: 100%    Last Pain:  Vitals:   07/17/17 0843  TempSrc: Oral         Complications: No apparent anesthesia complications

## 2017-07-17 NOTE — Op Note (Signed)
.  Preoperative diagnosis: Left ureteral stone  Postoperative diagnosis: Same  Procedure: 1 cystoscopy 2. Left retrograde pyelography 3.  Intraoperative fluoroscopy, under one hour, with interpretation 4.  Left ureteroscopic stone manipulation with laser lithotripsy 5.  Left 6 x 26 JJ stent exchange  Attending: Cleda MccreedyPatrick Mackenzie  Anesthesia: General  Estimated blood loss: None  Drains: Left 6 x 26 JJ ureteral stent without tether  Specimens: stone for analysis  Antibiotics: ancef  Findings: left proximal ureteral stone. No hydronephrosis. No masses/lesions in the bladder. Ureteral orifices in normal anatomic location.  Indications: Patient is a 61 year old male with a history of left ureteral stone and who underwent left stent placement. He is here for definitive management. After discussing treatment options, he decided proceed with left ureteroscopic stone manipulation.  Procedure her in detail: The patient was brought to the operating room and a brief timeout was done to ensure correct patient, correct procedure, correct site.  General anesthesia was administered patient was placed in dorsal lithotomy position.  Her genitalia was then prepped and draped in usual sterile fashion.  A rigid 22 French cystoscope was passed in the urethra and the bladder.  Bladder was inspected free masses or lesions.  the ureteral orifices were in the normal orthotopic locations.  a 6 french ureteral catheter was then instilled into the left ureteral orifice.  a gentle retrograde was obtained and findings noted above.  we then placed a zip wire through the ureteral catheter and advanced up to the renal pelvis.  we then removed the cystoscope and cannulated the left ureteral orifice with a semirigid ureteroscope.  We located a stone int he proximal ureter which we could not fragment with the semirigid ureteroscope due to the location of the stone.  a sensor wire was advanced in to the renal pelvis. We then  removed the ureteroscope and advanced am 12/14 x 24cm access sheath up to the stone. We then used the flexible ureteroscope with a 200nm laser fiber to fragment the stone.   the pieces were then removed with a Ngage basket.    once all stone fragments were removed we then removed the access sheath under direct vision and noted no injury to the ureter. We then placed a 6 x 26 double-j ureteral stent over the original zip wire.  We then removed the wire and good coil was noted in the the renal pelvis under fluoroscopy and the bladder under direct vision. the bladder was then drained and this concluded the procedure which was well tolerated by patient.  Complications: None  Condition: Stable, extubated, transferred to PACU  Plan: Patient is to be discharged home as to follow-up in one week for stent removal.

## 2017-07-17 NOTE — Anesthesia Procedure Notes (Signed)
Procedure Name: LMA Insertion Date/Time: 07/17/2017 11:46 AM Performed by: Danai Gotto D, CRNA Pre-anesthesia Checklist: Patient identified, Emergency Drugs available, Suction available and Patient being monitored Patient Re-evaluated:Patient Re-evaluated prior to induction Oxygen Delivery Method: Circle system utilized Preoxygenation: Pre-oxygenation with 100% oxygen Induction Type: IV induction Ventilation: Mask ventilation without difficulty LMA: LMA inserted LMA Size: 4.0 Tube type: Oral Number of attempts: 1 Placement Confirmation: positive ETCO2 and breath sounds checked- equal and bilateral Tube secured with: Tape Dental Injury: Teeth and Oropharynx as per pre-operative assessment

## 2017-07-17 NOTE — Discharge Instructions (Signed)

## 2017-07-17 NOTE — Anesthesia Preprocedure Evaluation (Signed)
Anesthesia Evaluation  Patient identified by MRN, date of birth, ID band  Reviewed: Allergy & Precautions, H&P , NPO status , Patient's Chart, lab work & pertinent test results  Airway Mallampati: II   Neck ROM: Full    Dental  (+) Missing, Dental Advisory Given, Poor Dentition, Chipped   Pulmonary Current Smoker, former smoker,  Current smoker, 25 pack year   breath sounds clear to auscultation       Cardiovascular hypertension, + Peripheral Vascular Disease   Rhythm:Regular Rate:Normal     Neuro/Psych Anxiety XanaxCVA    GI/Hepatic (+) Hepatitis -  Endo/Other    Renal/GU Renal disease     Musculoskeletal  (+) Arthritis ,   Abdominal (+)  Abdomen: soft.    Peds  Hematology   Anesthesia Other Findings Chronic pain   Reproductive/Obstetrics                             Anesthesia Physical  Anesthesia Plan  ASA: III  Anesthesia Plan: General   Post-op Pain Management:    Induction: Intravenous  PONV Risk Score and Plan: 1 and Ondansetron and Treatment may vary due to age or medical condition  Airway Management Planned: LMA  Additional Equipment:   Intra-op Plan:   Post-operative Plan: Extubation in OR  Informed Consent: I have reviewed the patients History and Physical, chart, labs and discussed the procedure including the risks, benefits and alternatives for the proposed anesthesia with the patient or authorized representative who has indicated his/her understanding and acceptance.   Dental advisory given  Plan Discussed with: CRNA  Anesthesia Plan Comments:         Anesthesia Quick Evaluation

## 2017-07-17 NOTE — Anesthesia Postprocedure Evaluation (Signed)
Anesthesia Post Note  Patient: Brendan Charles  Procedure(s) Performed: CYSTOSCOPY/RETROGRADE/URETEROSCOPY, stent removal, stone extraction, stent replacement (Left Ureter) HOLMIUM LASER APPLICATION (Left Ureter)     Patient location during evaluation: PACU Anesthesia Type: General Level of consciousness: awake and alert Pain management: pain level controlled Vital Signs Assessment: post-procedure vital signs reviewed and stable Respiratory status: spontaneous breathing, nonlabored ventilation and respiratory function stable Cardiovascular status: blood pressure returned to baseline and stable Postop Assessment: no apparent nausea or vomiting Anesthetic complications: no    Last Vitals:  Vitals:   07/17/17 1352 07/17/17 1504  BP: (!) 160/85 (!) 168/105  Pulse: 86 99  Resp: 14 16  Temp: (!) 36.3 C 36.8 C  SpO2: 96% 96%    Last Pain:  Vitals:   07/17/17 1504  TempSrc: Oral  PainSc: 7                  Lowella CurbWarren Ray Bryant Lipps

## 2017-07-18 DIAGNOSIS — N201 Calculus of ureter: Secondary | ICD-10-CM | POA: Diagnosis not present

## 2017-07-21 ENCOUNTER — Encounter (HOSPITAL_COMMUNITY): Payer: Self-pay

## 2017-07-21 ENCOUNTER — Emergency Department (HOSPITAL_COMMUNITY): Payer: Medicare Other

## 2017-07-21 ENCOUNTER — Emergency Department (HOSPITAL_COMMUNITY)
Admission: EM | Admit: 2017-07-21 | Discharge: 2017-07-21 | Disposition: A | Discharge status: Home / Self Care | Payer: Medicare Other | Attending: Emergency Medicine | Admitting: Emergency Medicine

## 2017-07-21 DIAGNOSIS — I10 Essential (primary) hypertension: Secondary | ICD-10-CM | POA: Insufficient documentation

## 2017-07-21 DIAGNOSIS — R1032 Left lower quadrant pain: Secondary | ICD-10-CM | POA: Diagnosis present

## 2017-07-21 DIAGNOSIS — Z7982 Long term (current) use of aspirin: Secondary | ICD-10-CM | POA: Insufficient documentation

## 2017-07-21 DIAGNOSIS — Z79899 Other long term (current) drug therapy: Secondary | ICD-10-CM | POA: Insufficient documentation

## 2017-07-21 DIAGNOSIS — Z87891 Personal history of nicotine dependence: Secondary | ICD-10-CM | POA: Diagnosis not present

## 2017-07-21 DIAGNOSIS — N132 Hydronephrosis with renal and ureteral calculous obstruction: Secondary | ICD-10-CM | POA: Diagnosis not present

## 2017-07-21 DIAGNOSIS — Z960 Presence of urogenital implants: Secondary | ICD-10-CM | POA: Diagnosis not present

## 2017-07-21 DIAGNOSIS — Z7902 Long term (current) use of antithrombotics/antiplatelets: Secondary | ICD-10-CM | POA: Insufficient documentation

## 2017-07-21 DIAGNOSIS — N200 Calculus of kidney: Secondary | ICD-10-CM | POA: Diagnosis not present

## 2017-07-21 LAB — CBC WITH DIFFERENTIAL/PLATELET
BASOS ABS: 0 10*3/uL (ref 0.0–0.1)
Basophils Relative: 0 %
EOS PCT: 3 %
Eosinophils Absolute: 0.3 10*3/uL (ref 0.0–0.7)
HEMATOCRIT: 41 % (ref 39.0–52.0)
HEMOGLOBIN: 13.2 g/dL (ref 13.0–17.0)
LYMPHS ABS: 2.5 10*3/uL (ref 0.7–4.0)
LYMPHS PCT: 27 %
MCH: 29.9 pg (ref 26.0–34.0)
MCHC: 32.2 g/dL (ref 30.0–36.0)
MCV: 93 fL (ref 78.0–100.0)
Monocytes Absolute: 0.8 10*3/uL (ref 0.1–1.0)
Monocytes Relative: 8 %
NEUTROS ABS: 5.8 10*3/uL (ref 1.7–7.7)
NEUTROS PCT: 62 %
PLATELETS: 329 10*3/uL (ref 150–400)
RBC: 4.41 MIL/uL (ref 4.22–5.81)
RDW: 12.6 % (ref 11.5–15.5)
WBC: 9.4 10*3/uL (ref 4.0–10.5)

## 2017-07-21 LAB — URINALYSIS, ROUTINE W REFLEX MICROSCOPIC
Bilirubin Urine: NEGATIVE
Glucose, UA: NEGATIVE mg/dL
Ketones, ur: NEGATIVE mg/dL
Nitrite: NEGATIVE
PROTEIN: 100 mg/dL — AB
SPECIFIC GRAVITY, URINE: 1.016 (ref 1.005–1.030)
SQUAMOUS EPITHELIAL / LPF: NONE SEEN
pH: 6 (ref 5.0–8.0)

## 2017-07-21 LAB — BASIC METABOLIC PANEL
ANION GAP: 7 (ref 5–15)
BUN: 17 mg/dL (ref 6–20)
CHLORIDE: 109 mmol/L (ref 101–111)
CO2: 27 mmol/L (ref 22–32)
Calcium: 9.3 mg/dL (ref 8.9–10.3)
Creatinine, Ser: 1.02 mg/dL (ref 0.61–1.24)
GFR calc Af Amer: >60 mL/min (ref >60)
GLUCOSE: 106 mg/dL — AB (ref 65–99)
POTASSIUM: 3.6 mmol/L (ref 3.5–5.1)
Sodium: 143 mmol/L (ref 135–145)

## 2017-07-21 MED ORDER — CEPHALEXIN 500 MG PO CAPS: 500.0000 mg | ORAL_CAPSULE | Freq: Four times a day (QID) | ORAL | 0 refills | Status: DC

## 2017-07-21 MED ORDER — SODIUM CHLORIDE 0.9 % IV BOLUS (SEPSIS)
500.0000 mL | Freq: Once | INTRAVENOUS | Status: AC
  Administered 2017-07-21: 500 mL via INTRAVENOUS

## 2017-07-21 MED ORDER — HYDROMORPHONE HCL 1 MG/ML IJ SOLN
1.0000 mg | Freq: Once | INTRAMUSCULAR | Status: AC
  Administered 2017-07-21: 1 mg via INTRAVENOUS
  Filled 2017-07-21: qty 1

## 2017-07-21 MED ORDER — KETOROLAC TROMETHAMINE 10 MG PO TABS: 10.0000 mg | ORAL_TABLET | Freq: Four times a day (QID) | ORAL | 0 refills | Status: DC | PRN

## 2017-07-21 MED ORDER — CEFTRIAXONE SODIUM 1 G IJ SOLR
1.0000 g | Freq: Once | INTRAMUSCULAR | Status: AC
  Administered 2017-07-21: 1 g via INTRAVENOUS
  Filled 2017-07-21: qty 10

## 2017-07-21 MED ORDER — KETOROLAC TROMETHAMINE 30 MG/ML IJ SOLN
30.0000 mg | Freq: Once | INTRAMUSCULAR | Status: AC
  Administered 2017-07-21: 30 mg via INTRAVENOUS
  Filled 2017-07-21: qty 1

## 2017-07-21 MED ORDER — ONDANSETRON HCL 4 MG/2ML IJ SOLN
4.0000 mg | Freq: Once | INTRAMUSCULAR | Status: AC
  Administered 2017-07-21: 4 mg via INTRAVENOUS
  Filled 2017-07-21: qty 2

## 2017-07-21 NOTE — ED Provider Notes (Signed)
Patient was evaluated by urologist Dr. Bjorn PippinJohn Wrenn.  He recommended discharge home.  We will add Toradol 10 mg and Keflex 500 mg to discharge medicine.  Patient will follow-up with urology.   Donnetta Hutchingook, Diangelo Radel, MD 07/21/17 [phone]

## 2017-07-21 NOTE — Discharge Instructions (Signed)
Continue your pain pills at home.  We will add a prescription for Toradol (ketorolac) which is an anti-inflammatory agent and an antibiotic.  Follow-up with the urologist or return if worse.

## 2017-07-21 NOTE — ED Triage Notes (Signed)
Pt had a kidney stone earlier in the week and had a stent placed for same.  Pt states he started having pain to same area approx 1 am.

## 2017-07-21 NOTE — ED Provider Notes (Signed)
[facility] EMERGENCY DEPARTMENT Provider Note   CSN: [id number] Arrival date & time: 07/21/17  [id number]     History   Chief Complaint Chief Complaint  Patient presents with  . Flank Pain    s/p stent placement for stone    HPI Susanne GreenhouseDouglas B Aristizabal is a 61 y.o. male.  Patient presents with left-sided abdominal pain and flank pain that woke him from sleep at about 1 AM.  Reports pain is constant the left side of his abdomen.  Denies any fever, chills, nausea or vomiting.  Patient states he had a stent placed by Dr. Ronne BinningMcKenzie on December 10 and a kidney stone removed.  He was not having this pain prior to this.  He is never had this kind of pain before.  Denies any diarrhea or constipation.  Denies any chest pain or shortness of breath.  Denies any pain in his testicles.  States is not been able to urinate since 1 AM.  Did not have any pain with urination last night or hematuria.   The history is provided by the patient and a relative.  Flank Pain  Associated symptoms include abdominal pain. Pertinent negatives include no chest pain, no headaches and no shortness of breath.    Past Medical History:  Diagnosis Date  . Anxiety   . Chronic back pain   . CVA (cerebral vascular accident) (HCC) 03/20/2017  . Hepatitis C    HEP C, never treated, 2007  . Hypertension     Patient Active Problem List   Diagnosis Date Noted  . Spastic hemiparesis of left dominant side as late effect of cerebral infarction (HCC) 04/19/2017  . Gait disturbance, post-stroke 04/18/2017  . Adjustment disorder with depressed mood   . Cerebral infarction due to thrombosis of right middle cerebral artery (HCC) 04/13/2017  . Weakness   . Acute ischemic stroke (HCC) 04/12/2017  . Stroke (cerebrum) (HCC) 04/12/2017  . Acute CVA (cerebrovascular accident) (HCC) 04/12/2017  . CVA (cerebral vascular accident) (HCC) 03/20/2017  . Hydronephrosis, left 03/20/2017  . Aortic atherosclerosis (HCC) 03/20/2017  . Hepatitis C  02/14/2017  . Encounter for screening colonoscopy 02/14/2017  . Spondylolisthesis at L3-L4 level 06/03/2014    Past Surgical History:  Procedure Laterality Date  . CERVICAL DISC SURGERY  2001   anterior  . COLONOSCOPY WITH PROPOFOL N/A 03/14/2017   Procedure: COLONOSCOPY WITH PROPOFOL;  Surgeon: West BaliFields, Sandi L, MD;  Location: [facility];  Service: Endoscopy;  Laterality: N/A;  8:15am  . CYSTOSCOPY W/ URETERAL STENT PLACEMENT Left 03/23/2017   Procedure: CYSTOSCOPY WITH LEFT RETROGRADE PYELOGRAM/URETERAL LEFT STENT PLACEMENT;  Surgeon: Malen GauzeMcKenzie, Patrick L, MD;  Location: [facility];  Service: Urology;  Laterality: Left;  . CYSTOSCOPY/RETROGRADE/URETEROSCOPY Left 07/17/2017   Procedure: CYSTOSCOPY/RETROGRADE/URETEROSCOPY, stent removal, stone extraction, stent replacement;  Surgeon: Malen GauzeMcKenzie, Patrick L, MD;  Location: [facility];  Service: Urology;  Laterality: Left;  . HOLMIUM LASER APPLICATION Left 07/17/2017   Procedure: HOLMIUM LASER APPLICATION;  Surgeon: Malen GauzeMcKenzie, Patrick L, MD;  Location: [facility];  Service: Urology;  Laterality: Left;  . LUMBAR SPINE SURGERY  2015  . POLYPECTOMY  03/14/2017   Procedure: POLYPECTOMY;  Surgeon: West BaliFields, Sandi L, MD;  Location: [facility];  Service: Endoscopy;;  rectum       Home Medications    Prior to Admission medications   Medication Sig Start Date End Date Taking? Authorizing Provider  ALPRAZolam Prudy Feeler(XANAX) 0.5 MG tablet Take 0.5 mg by mouth 3 (three) times daily as needed for anxiety.  [provider]  aspirin 325 MG tablet Take 1 tablet (325 mg total) by mouth daily. 03/22/17   Rhetta MuraSamtani, Jai-Gurmukh, MD  atorvastatin (LIPITOR) 80 MG tablet Take 1 tablet (80 mg total) by mouth daily at 6 PM. Patient taking differently: Take 80 mg by mouth at bedtime.  05/12/17   Angiulli, Mcarthur Rossettianiel J, PA-C  clopidogrel (PLAVIX) 75 MG tablet Take 1 tablet (75 mg total) by mouth daily with breakfast. 05/12/17   Angiulli, Mcarthur Rossettianiel J, PA-C  Maltodextrin-Xanthan Gum  (RESOURCE THICKENUP CLEAR) POWD Take 360 g by mouth as needed. Patient not taking: Reported on 07/07/2017 05/12/17   Angiulli, Mcarthur Rossettianiel J, PA-C  metoprolol tartrate (LOPRESSOR) 25 MG tablet Take 1 tablet (25 mg total) by mouth 2 (two) times daily. 05/12/17   Angiulli, Mcarthur Rossettianiel J, PA-C  mirtazapine (REMERON SOL-TAB) 15 MG disintegrating tablet Take 1 tablet (15 mg total) by mouth at bedtime. 05/12/17   Angiulli, Mcarthur Rossettianiel J, PA-C  oxyCODONE-acetaminophen (PERCOCET) 7.5-325 MG tablet Take 1 tablet by mouth every 4 (four) hours as needed for severe pain. 07/17/17   McKenzie, Mardene CelestePatrick L, MD  tamsulosin (FLOMAX) 0.4 MG CAPS capsule Take 1 capsule (0.4 mg total) by mouth daily. 07/17/17   Malen GauzeMcKenzie, Patrick L, MD    Family History Family History  Problem Relation Age of Onset  . Cancer Mother   . Colon cancer Neg Hx     Social History Social History   Tobacco Use  . Smoking status: Former Smoker    Packs/day: 0.50    Years: 45.00    Pack years: 22.50    Types: Cigarettes    Last attempt to quit: 03/2017    Years since quitting: 0.3  . Smokeless tobacco: Never Used  . Tobacco comment: pt states quit smoking 1 wk ago  Substance Use Topics  . Alcohol use: No  . Drug use: No     Allergies   Codeine   Review of Systems Review of Systems  Constitutional: Negative for activity change, appetite change and fever.  HENT: Negative for congestion and rhinorrhea.   Respiratory: Negative for cough, chest tightness and shortness of breath.   Cardiovascular: Negative for chest pain.  Gastrointestinal: Positive for abdominal pain and nausea. Negative for constipation, diarrhea and vomiting.  Genitourinary: Positive for difficulty urinating and flank pain. Negative for dysuria and testicular pain.  Musculoskeletal: Negative for arthralgias and myalgias.  Skin: Negative for wound.  Neurological: Negative for dizziness, weakness and headaches.    all other systems are negative except as noted in the HPI  and PMH.    Physical Exam Updated Vital Signs BP (!) 179/108   Pulse 87   Resp 17   SpO2 100%   Physical Exam  Constitutional: He is oriented to person, place, and time. He appears well-developed and well-nourished. No distress.  HENT:  Head: Normocephalic and atraumatic.  Mouth/Throat: Oropharynx is clear and moist. No oropharyngeal exudate.  Eyes: Conjunctivae and EOM are normal. Pupils are equal, round, and reactive to light.  Neck: Normal range of motion. Neck supple.  No meningismus.  Cardiovascular: Normal rate, regular rhythm, normal heart sounds and intact distal pulses.  No murmur heard. Pulmonary/Chest: Effort normal and breath sounds normal. No respiratory distress. He exhibits no tenderness.  Abdominal: Soft. There is tenderness. There is no rebound and no guarding.  TTP L abdomen with voluntary guarding  Genitourinary:  Genitourinary Comments: No testicular tenderness  Musculoskeletal: Normal range of motion. He exhibits no edema or tenderness.  No CVAT  Neurological: He is alert and oriented to person, place, and time. No cranial nerve deficit. He exhibits normal muscle tone. Coordination normal.  No ataxia on finger to nose bilaterally. No pronator drift. 5/5 strength throughout. CN 2-12 intact.Equal grip strength. Sensation intact.   Skin: Skin is warm. Capillary refill takes less than 2 seconds.  Psychiatric: He has a normal mood and affect. His behavior is normal.  Nursing note and vitals reviewed.    ED Treatments / Results  Labs (all labs ordered are listed, but only abnormal results are displayed) Labs Reviewed  URINALYSIS, ROUTINE W REFLEX MICROSCOPIC - Abnormal; Notable for the following components:      Result Value   APPearance HAZY (*)    Hgb urine dipstick LARGE (*)    Protein, ur 100 (*)    Leukocytes, UA MODERATE (*)    Bacteria, UA RARE (*)    All other components within normal limits  BASIC METABOLIC PANEL - Abnormal; Notable for the  following components:   Glucose, Bld 106 (*)    All other components within normal limits  URINE CULTURE  CBC WITH DIFFERENTIAL/PLATELET    EKG  EKG Interpretation None       Radiology Ct Renal Stone Study  Result Date: 07/21/2017 CLINICAL DATA:  Flank pain with stone disease suspected. Stent placed earlier this week. EXAM: CT ABDOMEN AND PELVIS WITHOUT CONTRAST TECHNIQUE: Multidetector CT imaging of the abdomen and pelvis was performed following the standard protocol without IV contrast. COMPARISON:  03/18/2017.  Fluoroscopy 07/17/2017 FINDINGS: Lower chest: No acute finding. Coronary atherosclerosis. Stable subpleural density in the medial right middle lobe favored to reflect scarring. Follow-up recommendation reported previously. Hepatobiliary: Subcentimeter lesion in the central liver too small for accurate densitometry, favored cystic on coronal reformats.No evidence of biliary obstruction or stone. Pancreas: Unremarkable. Spleen: Unremarkable. Adrenals/Urinary Tract: Negative adrenals. Moderate left hydronephrosis. Diffuse left hydroureter. There is an internal ureteral stent with the upper retention loop at the UPJ and lower retention loop in the bladder. Irregularly-shaped stone fragments seen about the mid ureteral stent (L4/5). Stone fragments individually measure up to 3 mm. At least 2 punctate stones layer within the intrarenal collecting system on the left. Asymmetric left renal enlargement which may be obstructive. Periureteric edema which is nonspecific after recent ureteroscopy and stent manipulation. Stomach/Bowel:  No obstruction. No appendicitis. Vascular/Lymphatic: No acute vascular abnormality. No mass or adenopathy. Reproductive:No pathologic findings. Other: No ascites or pneumoperitoneum. Musculoskeletal: L3-4 PLIF. There is probably a small amount of posterior interbody arthrodesis. No noted hardware failure. Spondylosis and disc degeneration seen elsewhere in the lumbar  spine. IMPRESSION: 1. Left hydronephrosis and diffuse hydroureter despite well-positioned ureteral stent. Clustered small stone fragments seen about the stent at the level of L4 and L5. 2. Punctate calculi layer in the interpolar left kidney. Electronically Signed   By: Marnee Spring M.D.   On: 07/21/2017 06:52    Procedures Procedures (including critical care time)  Medications Ordered in ED Medications  sodium chloride 0.9 % bolus 500 mL (500 mLs Intravenous New Bag/Given 07/21/17 0558)  HYDROmorphone (DILAUDID) injection 1 mg (1 mg Intravenous Given 07/21/17 0558)  ondansetron (ZOFRAN) injection 4 mg (4 mg Intravenous Given 07/21/17 0558)     Initial Impression / Assessment and Plan / ED Course  I have reviewed the triage vital signs and the nursing notes.  Pertinent labs & imaging results that were available during my care of the patient were reviewed by me and considered in my medical decision  making (see chart for details).    Patient awoke from sleep with left-sided abdominal pain.  No fever or vomiting.  Stent placement 4 days ago with kidney stone removal.  Patient is uncomfortable.  He is afebrile. Urinalysis shows many red cells and many white cells with leukocytes.  Culture sent.  IV Rocephin given.  CT scan shows hydronephrosis despite proper stent placement in stone debris within stent.  Discussed with Dr. Alvester Morin of urology.  He recommends additional pain control and he will review images.  He does not feel strongly about antibiotics if patient is afebrile with no leukocytosis.  Dr. Adriana Simas to assume care at shift change. Awaiting return call.  Final Clinical Impressions(s) / ED Diagnoses   Final diagnoses:  None    ED Discharge Orders    None       Caresse Sedivy, Jeannett Senior, MD 07/21/17 [phone]

## 2017-07-22 LAB — URINE CULTURE

## 2017-07-25 DIAGNOSIS — N201 Calculus of ureter: Secondary | ICD-10-CM | POA: Diagnosis not present

## 2017-08-02 DIAGNOSIS — E785 Hyperlipidemia, unspecified: Secondary | ICD-10-CM | POA: Diagnosis not present

## 2017-08-02 DIAGNOSIS — M519 Unspecified thoracic, thoracolumbar and lumbosacral intervertebral disc disorder: Secondary | ICD-10-CM | POA: Diagnosis not present

## 2017-08-02 DIAGNOSIS — I10 Essential (primary) hypertension: Secondary | ICD-10-CM | POA: Diagnosis not present

## 2017-08-02 DIAGNOSIS — Z7902 Long term (current) use of antithrombotics/antiplatelets: Secondary | ICD-10-CM | POA: Diagnosis not present

## 2017-08-02 DIAGNOSIS — G8929 Other chronic pain: Secondary | ICD-10-CM | POA: Diagnosis not present

## 2017-08-02 DIAGNOSIS — Z7982 Long term (current) use of aspirin: Secondary | ICD-10-CM | POA: Diagnosis not present

## 2017-08-02 DIAGNOSIS — I69351 Hemiplegia and hemiparesis following cerebral infarction affecting right dominant side: Secondary | ICD-10-CM | POA: Diagnosis not present

## 2017-08-02 DIAGNOSIS — Z79891 Long term (current) use of opiate analgesic: Secondary | ICD-10-CM | POA: Diagnosis not present

## 2017-08-03 DIAGNOSIS — I69351 Hemiplegia and hemiparesis following cerebral infarction affecting right dominant side: Secondary | ICD-10-CM | POA: Diagnosis not present

## 2017-08-03 DIAGNOSIS — I10 Essential (primary) hypertension: Secondary | ICD-10-CM | POA: Diagnosis not present

## 2017-08-03 DIAGNOSIS — Z7902 Long term (current) use of antithrombotics/antiplatelets: Secondary | ICD-10-CM | POA: Diagnosis not present

## 2017-08-03 DIAGNOSIS — M519 Unspecified thoracic, thoracolumbar and lumbosacral intervertebral disc disorder: Secondary | ICD-10-CM | POA: Diagnosis not present

## 2017-08-03 DIAGNOSIS — Z7982 Long term (current) use of aspirin: Secondary | ICD-10-CM | POA: Diagnosis not present

## 2017-08-03 DIAGNOSIS — E785 Hyperlipidemia, unspecified: Secondary | ICD-10-CM | POA: Diagnosis not present

## 2017-08-03 DIAGNOSIS — G8929 Other chronic pain: Secondary | ICD-10-CM | POA: Diagnosis not present

## 2017-08-03 DIAGNOSIS — Z79891 Long term (current) use of opiate analgesic: Secondary | ICD-10-CM | POA: Diagnosis not present

## 2017-08-04 DIAGNOSIS — G8929 Other chronic pain: Secondary | ICD-10-CM | POA: Diagnosis not present

## 2017-08-04 DIAGNOSIS — M519 Unspecified thoracic, thoracolumbar and lumbosacral intervertebral disc disorder: Secondary | ICD-10-CM | POA: Diagnosis not present

## 2017-08-04 DIAGNOSIS — I10 Essential (primary) hypertension: Secondary | ICD-10-CM | POA: Diagnosis not present

## 2017-08-04 DIAGNOSIS — Z79891 Long term (current) use of opiate analgesic: Secondary | ICD-10-CM | POA: Diagnosis not present

## 2017-08-04 DIAGNOSIS — I69351 Hemiplegia and hemiparesis following cerebral infarction affecting right dominant side: Secondary | ICD-10-CM | POA: Diagnosis not present

## 2017-08-04 DIAGNOSIS — E785 Hyperlipidemia, unspecified: Secondary | ICD-10-CM | POA: Diagnosis not present

## 2017-08-04 DIAGNOSIS — Z7902 Long term (current) use of antithrombotics/antiplatelets: Secondary | ICD-10-CM | POA: Diagnosis not present

## 2017-08-04 DIAGNOSIS — Z7982 Long term (current) use of aspirin: Secondary | ICD-10-CM | POA: Diagnosis not present

## 2017-08-07 ENCOUNTER — Encounter: Payer: Self-pay | Admitting: Neurology

## 2017-08-07 DIAGNOSIS — G894 Chronic pain syndrome: Secondary | ICD-10-CM | POA: Diagnosis not present

## 2017-08-07 DIAGNOSIS — M5136 Other intervertebral disc degeneration, lumbar region: Secondary | ICD-10-CM | POA: Diagnosis not present

## 2017-08-07 DIAGNOSIS — Z1389 Encounter for screening for other disorder: Secondary | ICD-10-CM | POA: Diagnosis not present

## 2017-08-07 DIAGNOSIS — Z7689 Persons encountering health services in other specified circumstances: Secondary | ICD-10-CM | POA: Diagnosis not present

## 2017-08-07 DIAGNOSIS — Z6822 Body mass index (BMI) 22.0-22.9, adult: Secondary | ICD-10-CM | POA: Diagnosis not present

## 2017-08-08 DIAGNOSIS — G8929 Other chronic pain: Secondary | ICD-10-CM | POA: Diagnosis not present

## 2017-08-08 DIAGNOSIS — Z79891 Long term (current) use of opiate analgesic: Secondary | ICD-10-CM | POA: Diagnosis not present

## 2017-08-08 DIAGNOSIS — I69351 Hemiplegia and hemiparesis following cerebral infarction affecting right dominant side: Secondary | ICD-10-CM | POA: Diagnosis not present

## 2017-08-08 DIAGNOSIS — M519 Unspecified thoracic, thoracolumbar and lumbosacral intervertebral disc disorder: Secondary | ICD-10-CM | POA: Diagnosis not present

## 2017-08-08 DIAGNOSIS — I10 Essential (primary) hypertension: Secondary | ICD-10-CM | POA: Diagnosis not present

## 2017-08-08 DIAGNOSIS — Z7902 Long term (current) use of antithrombotics/antiplatelets: Secondary | ICD-10-CM | POA: Diagnosis not present

## 2017-08-08 DIAGNOSIS — E785 Hyperlipidemia, unspecified: Secondary | ICD-10-CM | POA: Diagnosis not present

## 2017-08-08 DIAGNOSIS — Z7982 Long term (current) use of aspirin: Secondary | ICD-10-CM | POA: Diagnosis not present

## 2017-08-10 DIAGNOSIS — M519 Unspecified thoracic, thoracolumbar and lumbosacral intervertebral disc disorder: Secondary | ICD-10-CM | POA: Diagnosis not present

## 2017-08-10 DIAGNOSIS — I69351 Hemiplegia and hemiparesis following cerebral infarction affecting right dominant side: Secondary | ICD-10-CM | POA: Diagnosis not present

## 2017-08-10 DIAGNOSIS — G8929 Other chronic pain: Secondary | ICD-10-CM | POA: Diagnosis not present

## 2017-08-10 DIAGNOSIS — Z7982 Long term (current) use of aspirin: Secondary | ICD-10-CM | POA: Diagnosis not present

## 2017-08-10 DIAGNOSIS — E785 Hyperlipidemia, unspecified: Secondary | ICD-10-CM | POA: Diagnosis not present

## 2017-08-10 DIAGNOSIS — I10 Essential (primary) hypertension: Secondary | ICD-10-CM | POA: Diagnosis not present

## 2017-08-10 DIAGNOSIS — Z7902 Long term (current) use of antithrombotics/antiplatelets: Secondary | ICD-10-CM | POA: Diagnosis not present

## 2017-08-10 DIAGNOSIS — Z79891 Long term (current) use of opiate analgesic: Secondary | ICD-10-CM | POA: Diagnosis not present

## 2017-08-11 DIAGNOSIS — Z79891 Long term (current) use of opiate analgesic: Secondary | ICD-10-CM | POA: Diagnosis not present

## 2017-08-11 DIAGNOSIS — Z7902 Long term (current) use of antithrombotics/antiplatelets: Secondary | ICD-10-CM | POA: Diagnosis not present

## 2017-08-11 DIAGNOSIS — I69351 Hemiplegia and hemiparesis following cerebral infarction affecting right dominant side: Secondary | ICD-10-CM | POA: Diagnosis not present

## 2017-08-11 DIAGNOSIS — E785 Hyperlipidemia, unspecified: Secondary | ICD-10-CM | POA: Diagnosis not present

## 2017-08-11 DIAGNOSIS — G8929 Other chronic pain: Secondary | ICD-10-CM | POA: Diagnosis not present

## 2017-08-11 DIAGNOSIS — Z7982 Long term (current) use of aspirin: Secondary | ICD-10-CM | POA: Diagnosis not present

## 2017-08-11 DIAGNOSIS — M519 Unspecified thoracic, thoracolumbar and lumbosacral intervertebral disc disorder: Secondary | ICD-10-CM | POA: Diagnosis not present

## 2017-08-11 DIAGNOSIS — I639 Cerebral infarction, unspecified: Secondary | ICD-10-CM | POA: Diagnosis not present

## 2017-08-11 DIAGNOSIS — I10 Essential (primary) hypertension: Secondary | ICD-10-CM | POA: Diagnosis not present

## 2017-08-14 DIAGNOSIS — I69351 Hemiplegia and hemiparesis following cerebral infarction affecting right dominant side: Secondary | ICD-10-CM | POA: Diagnosis not present

## 2017-08-14 DIAGNOSIS — M519 Unspecified thoracic, thoracolumbar and lumbosacral intervertebral disc disorder: Secondary | ICD-10-CM | POA: Diagnosis not present

## 2017-08-14 DIAGNOSIS — I10 Essential (primary) hypertension: Secondary | ICD-10-CM | POA: Diagnosis not present

## 2017-08-14 DIAGNOSIS — E785 Hyperlipidemia, unspecified: Secondary | ICD-10-CM | POA: Diagnosis not present

## 2017-08-14 DIAGNOSIS — Z79891 Long term (current) use of opiate analgesic: Secondary | ICD-10-CM | POA: Diagnosis not present

## 2017-08-14 DIAGNOSIS — G8929 Other chronic pain: Secondary | ICD-10-CM | POA: Diagnosis not present

## 2017-08-14 DIAGNOSIS — Z7982 Long term (current) use of aspirin: Secondary | ICD-10-CM | POA: Diagnosis not present

## 2017-08-14 DIAGNOSIS — Z7902 Long term (current) use of antithrombotics/antiplatelets: Secondary | ICD-10-CM | POA: Diagnosis not present

## 2017-08-16 DIAGNOSIS — Z79891 Long term (current) use of opiate analgesic: Secondary | ICD-10-CM | POA: Diagnosis not present

## 2017-08-16 DIAGNOSIS — M519 Unspecified thoracic, thoracolumbar and lumbosacral intervertebral disc disorder: Secondary | ICD-10-CM | POA: Diagnosis not present

## 2017-08-16 DIAGNOSIS — I69351 Hemiplegia and hemiparesis following cerebral infarction affecting right dominant side: Secondary | ICD-10-CM | POA: Diagnosis not present

## 2017-08-16 DIAGNOSIS — I10 Essential (primary) hypertension: Secondary | ICD-10-CM | POA: Diagnosis not present

## 2017-08-16 DIAGNOSIS — E785 Hyperlipidemia, unspecified: Secondary | ICD-10-CM | POA: Diagnosis not present

## 2017-08-16 DIAGNOSIS — G8929 Other chronic pain: Secondary | ICD-10-CM | POA: Diagnosis not present

## 2017-08-16 DIAGNOSIS — Z7902 Long term (current) use of antithrombotics/antiplatelets: Secondary | ICD-10-CM | POA: Diagnosis not present

## 2017-08-16 DIAGNOSIS — Z7982 Long term (current) use of aspirin: Secondary | ICD-10-CM | POA: Diagnosis not present

## 2017-08-17 DIAGNOSIS — I10 Essential (primary) hypertension: Secondary | ICD-10-CM | POA: Diagnosis not present

## 2017-08-17 DIAGNOSIS — E785 Hyperlipidemia, unspecified: Secondary | ICD-10-CM | POA: Diagnosis not present

## 2017-08-17 DIAGNOSIS — Z79891 Long term (current) use of opiate analgesic: Secondary | ICD-10-CM | POA: Diagnosis not present

## 2017-08-17 DIAGNOSIS — Z7982 Long term (current) use of aspirin: Secondary | ICD-10-CM | POA: Diagnosis not present

## 2017-08-17 DIAGNOSIS — Z7902 Long term (current) use of antithrombotics/antiplatelets: Secondary | ICD-10-CM | POA: Diagnosis not present

## 2017-08-17 DIAGNOSIS — M519 Unspecified thoracic, thoracolumbar and lumbosacral intervertebral disc disorder: Secondary | ICD-10-CM | POA: Diagnosis not present

## 2017-08-17 DIAGNOSIS — I69351 Hemiplegia and hemiparesis following cerebral infarction affecting right dominant side: Secondary | ICD-10-CM | POA: Diagnosis not present

## 2017-08-17 DIAGNOSIS — G8929 Other chronic pain: Secondary | ICD-10-CM | POA: Diagnosis not present

## 2017-08-18 DIAGNOSIS — M519 Unspecified thoracic, thoracolumbar and lumbosacral intervertebral disc disorder: Secondary | ICD-10-CM | POA: Diagnosis not present

## 2017-08-18 DIAGNOSIS — Z79891 Long term (current) use of opiate analgesic: Secondary | ICD-10-CM | POA: Diagnosis not present

## 2017-08-18 DIAGNOSIS — G8929 Other chronic pain: Secondary | ICD-10-CM | POA: Diagnosis not present

## 2017-08-18 DIAGNOSIS — I10 Essential (primary) hypertension: Secondary | ICD-10-CM | POA: Diagnosis not present

## 2017-08-18 DIAGNOSIS — E785 Hyperlipidemia, unspecified: Secondary | ICD-10-CM | POA: Diagnosis not present

## 2017-08-18 DIAGNOSIS — Z7982 Long term (current) use of aspirin: Secondary | ICD-10-CM | POA: Diagnosis not present

## 2017-08-18 DIAGNOSIS — Z7902 Long term (current) use of antithrombotics/antiplatelets: Secondary | ICD-10-CM | POA: Diagnosis not present

## 2017-08-18 DIAGNOSIS — I69351 Hemiplegia and hemiparesis following cerebral infarction affecting right dominant side: Secondary | ICD-10-CM | POA: Diagnosis not present

## 2017-08-22 DIAGNOSIS — M519 Unspecified thoracic, thoracolumbar and lumbosacral intervertebral disc disorder: Secondary | ICD-10-CM | POA: Diagnosis not present

## 2017-08-22 DIAGNOSIS — Z79891 Long term (current) use of opiate analgesic: Secondary | ICD-10-CM | POA: Diagnosis not present

## 2017-08-22 DIAGNOSIS — I69351 Hemiplegia and hemiparesis following cerebral infarction affecting right dominant side: Secondary | ICD-10-CM | POA: Diagnosis not present

## 2017-08-22 DIAGNOSIS — G8929 Other chronic pain: Secondary | ICD-10-CM | POA: Diagnosis not present

## 2017-08-22 DIAGNOSIS — E785 Hyperlipidemia, unspecified: Secondary | ICD-10-CM | POA: Diagnosis not present

## 2017-08-22 DIAGNOSIS — Z7902 Long term (current) use of antithrombotics/antiplatelets: Secondary | ICD-10-CM | POA: Diagnosis not present

## 2017-08-22 DIAGNOSIS — I10 Essential (primary) hypertension: Secondary | ICD-10-CM | POA: Diagnosis not present

## 2017-08-22 DIAGNOSIS — Z7982 Long term (current) use of aspirin: Secondary | ICD-10-CM | POA: Diagnosis not present

## 2017-08-23 DIAGNOSIS — I69351 Hemiplegia and hemiparesis following cerebral infarction affecting right dominant side: Secondary | ICD-10-CM | POA: Diagnosis not present

## 2017-08-23 DIAGNOSIS — E785 Hyperlipidemia, unspecified: Secondary | ICD-10-CM | POA: Diagnosis not present

## 2017-08-23 DIAGNOSIS — G8929 Other chronic pain: Secondary | ICD-10-CM | POA: Diagnosis not present

## 2017-08-23 DIAGNOSIS — M519 Unspecified thoracic, thoracolumbar and lumbosacral intervertebral disc disorder: Secondary | ICD-10-CM | POA: Diagnosis not present

## 2017-08-23 DIAGNOSIS — I10 Essential (primary) hypertension: Secondary | ICD-10-CM | POA: Diagnosis not present

## 2017-08-23 DIAGNOSIS — Z7982 Long term (current) use of aspirin: Secondary | ICD-10-CM | POA: Diagnosis not present

## 2017-08-23 DIAGNOSIS — Z79891 Long term (current) use of opiate analgesic: Secondary | ICD-10-CM | POA: Diagnosis not present

## 2017-08-23 DIAGNOSIS — Z7902 Long term (current) use of antithrombotics/antiplatelets: Secondary | ICD-10-CM | POA: Diagnosis not present

## 2017-08-24 DIAGNOSIS — Z7982 Long term (current) use of aspirin: Secondary | ICD-10-CM | POA: Diagnosis not present

## 2017-08-24 DIAGNOSIS — E785 Hyperlipidemia, unspecified: Secondary | ICD-10-CM | POA: Diagnosis not present

## 2017-08-24 DIAGNOSIS — I69351 Hemiplegia and hemiparesis following cerebral infarction affecting right dominant side: Secondary | ICD-10-CM | POA: Diagnosis not present

## 2017-08-24 DIAGNOSIS — Z7902 Long term (current) use of antithrombotics/antiplatelets: Secondary | ICD-10-CM | POA: Diagnosis not present

## 2017-08-24 DIAGNOSIS — G8929 Other chronic pain: Secondary | ICD-10-CM | POA: Diagnosis not present

## 2017-08-24 DIAGNOSIS — M519 Unspecified thoracic, thoracolumbar and lumbosacral intervertebral disc disorder: Secondary | ICD-10-CM | POA: Diagnosis not present

## 2017-08-24 DIAGNOSIS — Z79891 Long term (current) use of opiate analgesic: Secondary | ICD-10-CM | POA: Diagnosis not present

## 2017-08-24 DIAGNOSIS — I10 Essential (primary) hypertension: Secondary | ICD-10-CM | POA: Diagnosis not present

## 2017-08-25 ENCOUNTER — Telehealth (HOSPITAL_COMMUNITY): Payer: Self-pay | Admitting: Internal Medicine

## 2017-08-25 NOTE — Telephone Encounter (Signed)
08/25/17  I called Dr. Sharyon MedicusFusco's office and left a message with Referral dept. About getting OT & PT Order for this patient .... As he comes to be seen Tuesday.  Amy with [facility] was going to call the office to let them know to send us the referrals but so far we still haven't received.

## 2017-08-28 ENCOUNTER — Telehealth (HOSPITAL_COMMUNITY): Payer: Self-pay

## 2017-08-28 ENCOUNTER — Ambulatory Visit: Payer: Medicare Other | Admitting: Neurology

## 2017-08-28 ENCOUNTER — Encounter: Payer: Self-pay | Admitting: Neurology

## 2017-08-28 ENCOUNTER — Other Ambulatory Visit: Payer: Medicare Other

## 2017-08-28 VITALS — BP 160/102 | HR 74 | Ht 68.75 in | Wt 153.6 lb

## 2017-08-28 DIAGNOSIS — I639 Cerebral infarction, unspecified: Secondary | ICD-10-CM

## 2017-08-28 DIAGNOSIS — I10 Essential (primary) hypertension: Secondary | ICD-10-CM

## 2017-08-28 MED ORDER — ATORVASTATIN CALCIUM 80 MG PO TABS: 80.0000 mg | ORAL_TABLET | Freq: Every day | ORAL | 3 refills | Status: DC

## 2017-08-28 MED ORDER — CLOPIDOGREL BISULFATE 75 MG PO TABS: 75.0000 mg | ORAL_TABLET | Freq: Every day | ORAL | 3 refills | Status: DC

## 2017-08-28 NOTE — Progress Notes (Signed)
NEUROLOGY CONSULTATION NOTE  Brendan GreenhouseDouglas B Charles MRN: [id number] DOB: 1955-11-11  Referring provider: Linford ArnoldMichael Depasquale, PA-C Primary care provider: Elfredia NevinsLawrence Fusco, MD  Reason for consult:  stroke  HISTORY OF PRESENT ILLNESS: Brendan GreenhouseDouglas B Charles is a 62 year old right-handed male with tobacco use, hypertension and history of prior strokes and  hepatitis C presents for recent strokes.  He is accompanied by his son who supplements history.  History is also supplemented by hospital records.    He was admitted to [facility] from 03/20/17 to 03/21/17 for stroke.  He underwent colonscopy a week prior to admission and developed slurred speech.  A few days later, he developed right sided weakness and speech difficulty but did not seek immediate medical attention.  The following day, he developed left lower quadrant pain and then went to the ED.  MRI of brain revealed acute left basal ganglia ischemic infarct extending towards the corona radiata with associated high-grade stenosis or occlusion of the anterior inferior left M2 segment on MRA.  It also revealed moderate to high-grade stenosis of the proximal right vertebral artery with distal flow beyond the PICA as well as 1.5 mm aneurysm or more likely infundibulum of the left PCOM artery.  Carotid doppler revealed small plaque in left ICA but no hemodynamically significant ICA stenosis bilaterally.  TTE showed EF 60-65% with no atrial septal defect/PFO or other cardiac source of emboli.  LDL was 89, Hgb A1c 5.5, HIV antibody negative, RPR negative, TSH 2.892, and B12 318.  He was started on ASA 325mg  and Plavix 75mg  and atorvastatin 80mg .    He was readmitted to [facility] from 04/12/17 to 04/13/17 for sudden onset left upper extremity numbness and weakness.  Due to recent stroke, he was not tPA candidate.  MRI of brain revealed acute right medullary lacunar ischemic infarct.  CTA of head and neck again revealed severe tandem right V4 vertebral artery  stenosis proximal to PICA origin but with patent carotid arteries and intracranial vasculature.  He was continued on dual antiplatelet therapy with Lipitor 80mg .  He was discharged to rehab for 1 week.  He again presented to the ED on 05/18/17 after waking up in the morning with new left sided numbness and weakness.  CT of head showed chronic lacunar infarcts but nothing acute.  MRI of brain showed expected evolution of his recent left basal ganglia/corona radiata and right medullary infarcts but no acute findings.  MRA of head was again notable for unchanged severe tandem right V4 vertebral artery stenosis.  He is back at home.  He has finished speech therapy.  He continues PT/OT.  He has quit smoking since the first stroke in August.     PAST MEDICAL HISTORY: Past Medical History:  Diagnosis Date  . Anxiety   . Chronic back pain   . CVA (cerebral vascular accident) (HCC) 03/20/2017  . Hepatitis C    HEP C, never treated, 2007  . Hypertension     PAST SURGICAL HISTORY: Past Surgical History:  Procedure Laterality Date  . CERVICAL DISC SURGERY  2001   anterior  . COLONOSCOPY WITH PROPOFOL N/A 03/14/2017   Procedure: COLONOSCOPY WITH PROPOFOL;  Surgeon: West BaliFields, Sandi L, MD;  [facility];  Service: Endoscopy;  Laterality: N/A;  8:15am  . CYSTOSCOPY W/ URETERAL STENT PLACEMENT Left 03/23/2017   Procedure: CYSTOSCOPY WITH LEFT RETROGRADE PYELOGRAM/URETERAL LEFT STENT PLACEMENT;  Surgeon: Malen GauzeMcKenzie, Patrick L, MD;  [facility]: [facility];  Service: Urology;  Laterality: Left;  .  CYSTOSCOPY/RETROGRADE/URETEROSCOPY Left 07/17/2017   Procedure: CYSTOSCOPY/RETROGRADE/URETEROSCOPY, stent removal, stone extraction, stent replacement;  Surgeon: Malen Gauze, MD;  Location: [facility];  Service: Urology;  Laterality: Left;  . HOLMIUM LASER APPLICATION Left 07/17/2017   Procedure: HOLMIUM LASER APPLICATION;  Surgeon: Malen Gauze, MD;  Location: [facility];  Service: Urology;  Laterality: Left;    . LUMBAR SPINE SURGERY  2015  . POLYPECTOMY  03/14/2017   Procedure: POLYPECTOMY;  Surgeon: West Bali, MD;  [facility];  Service: Endoscopy;;  rectum    MEDICATIONS: Current Outpatient Medications on File Prior to Visit  Medication Sig Dispense Refill  . ALPRAZolam (XANAX) 0.5 MG tablet Take 0.5 mg by mouth 3 (three) times daily as needed for anxiety.    Marland Kitchen aspirin 325 MG tablet Take 1 tablet (325 mg total) by mouth daily. 30 tablet 12  . ketorolac (TORADOL) 10 MG tablet Take 1 tablet (10 mg total) by mouth every 6 (six) hours as needed. (Patient not taking: Reported on 08/28/2017) 20 tablet 0  . Maltodextrin-Xanthan Gum (RESOURCE THICKENUP CLEAR) POWD Take 360 g by mouth as needed. (Patient not taking: Reported on 07/07/2017) 3 Can 1  . metoprolol tartrate (LOPRESSOR) 25 MG tablet Take 1 tablet (25 mg total) by mouth 2 (two) times daily. 60 tablet 0  . mirtazapine (REMERON SOL-TAB) 15 MG disintegrating tablet Take 1 tablet (15 mg total) by mouth at bedtime. 30 tablet 0  . oxyCODONE-acetaminophen (PERCOCET) 7.5-325 MG tablet Take 1 tablet by mouth every 4 (four) hours as needed for severe pain. 30 tablet 0  . tamsulosin (FLOMAX) 0.4 MG CAPS capsule Take 1 capsule (0.4 mg total) by mouth daily. (Patient not taking: Reported on 08/28/2017) 30 capsule 0   No current facility-administered medications on file prior to visit.     ALLERGIES: Allergies  Allergen Reactions  . Codeine Itching and Nausea Only    FAMILY HISTORY: Family History  Problem Relation Age of Onset  . Cancer Mother   . Dementia Sister   . Colon cancer Neg Hx     SOCIAL HISTORY: Social History   Socioeconomic History  . Marital status: Divorced    Spouse name: Not on file  . Number of children: Not on file  . Years of education: Not on file  . Highest education level: Associate degree: academic program  Social Needs  . Financial resource strain: Not on file  . Food insecurity - worry: Not on  file  . Food insecurity - inability: Not on file  . Transportation needs - medical: Not on file  . Transportation needs - non-medical: Not on file  Occupational History  . Occupation: retired    Comment: former Naval architect  Tobacco Use  . Smoking status: Former Smoker    Packs/day: 0.50    Years: 45.00    Pack years: 22.50    Types: Cigarettes    Last attempt to quit: 03/2017    Years since quitting: 0.4  . Smokeless tobacco: Never Used  . Tobacco comment: pt states quit smoking 1 wk ago  Substance and Sexual Activity  . Alcohol use: No  . Drug use: No  . Sexual activity: Not Currently    Birth control/protection: None  Other Topics Concern  . Not on file  Social History Narrative   Divorced, lives with son in a 2 story home, though Pt stays in 1st level. He drinks one cup of coffee and one soda a day. Is to begin O/P  P/T next week.    REVIEW OF SYSTEMS: Constitutional: No fevers, chills, or sweats, no generalized fatigue, change in appetite Eyes: No visual changes, double vision, eye pain Ear, nose and throat: No hearing loss, ear pain, nasal congestion, sore throat Cardiovascular: No chest pain, palpitations Respiratory:  No shortness of breath at rest or with exertion, wheezes GastrointestinaI: No nausea, vomiting, diarrhea, abdominal pain, fecal incontinence Genitourinary:  No dysuria, urinary retention or frequency Musculoskeletal:  No neck pain, back pain Integumentary: No rash, pruritus, skin lesions Neurological: as above Psychiatric: No depression, insomnia, anxiety Endocrine: No palpitations, fatigue, diaphoresis, mood swings, change in appetite, change in weight, increased thirst Hematologic/Lymphatic:  No purpura, petechiae. Allergic/Immunologic: no itchy/runny eyes, nasal congestion, recent allergic reactions, rashes  PHYSICAL EXAM: Vitals:   08/28/17 0925  BP: (!) 160/102  Pulse: 74  SpO2: 98%   General: No acute distress.  Patient appears  well-groomed.  Head:  Normocephalic/atraumatic Eyes:  fundi examined but not visualized Neck: supple, no paraspinal tenderness, full range of motion Back: No paraspinal tenderness Heart: regular rate and rhythm Lungs: Clear to auscultation bilaterally. Vascular: No carotid bruits. Neurological Exam: Mental status: alert and oriented to person, place, and time, recent and remote memory intact, fund of knowledge intact, attention and concentration intact, speech fluent but dysarthric, language intact. Cranial nerves: CN I: not tested CN II: pupils equal, round and reactive to light, visual fields intact CN III, IV, VI:  full range of motion, no nystagmus, no ptosis CN V: facial sensation intact CN VII: upper and lower face symmetric CN VIII: hearing intact CN IX, X: gag intact, uvula midline CN XI: sternocleidomastoid and trapezius muscles intact CN XII: tongue midline Bulk & Tone: normal, no fasciculations. Motor:  4+/5 left triceps, otherwise 5/5 throughout.   Sensation:  Pinprick sensation reduced in left upper and lower extremities and vibration sensation intact. Deep Tendon Reflexes:  3+ throughout but more brisk in the left upper and lower extremities, toes downgoing.  Finger to nose testing:  Without dysmetria.   Heel to shin:  Without dysmetria.  Gait:  Ataxic gait.  Unable to ambulate or turn without walker.  Unable to tandem walk. Romberg positive.  IMPRESSION: 1.  Multiple strokes or TIAs.  The strokes themselves look like secondary to small vessel disease.  Risk of recurrent stroke is highest in the first 3 months after initial stroke.  However, since he has had several, I think we should evaluate cardiac source further.   2.  Hypertension   PLAN: 1.  Continue ASA 325mg  daily and Plavix 75mg  daily for secondary stroke prevention. 2.  Continue Lipitor 80mg  daily.  Repeat lipid panel.  3.  Optimize blood pressure control.  Elevated today.  Advised to contact Dr. Sharyon Medicus  office to make necessary medication adjustments. 4.  We will check TEE and 30 day event monitor to evaluate for cardiac source. 5.  Follow up in 3 months.  Thank you for allowing me to take part in the care of this patient.  Shon Millet, DO  CC:  Elfredia Nevins, MD  Linford Arnold, PA-C

## 2017-08-28 NOTE — Patient Instructions (Signed)
1.  Continue aspirin and Plavix daily 2.  Continue atorvastatin 80mg  daily.  We will check a lipid panel today. 3.  Contact Dr. Sharyon MedicusFusco's office regarding your blood pressure.  We really need to get that under control 4.  We will check a TEE (echocardiogram) and 30 day event monitor. 5.  Follow up in 3 months.

## 2017-08-28 NOTE — Telephone Encounter (Signed)
Amy called stating she spoke with Alissa at [facility] last week and she was going to fax the order to us for this pt. Amy will call again in the morning to make sure she sends the order. NF 08/28/2016

## 2017-08-28 NOTE — Progress Notes (Addendum)
Called Brendan Charles to schedule TEE and 30 min event monitor, was transferred 5X, spoke with someone in shortstay who suggested I call [phone] ([facility] on [address] in [place]) there was no answer, LM on VM with Pt's name, DOB, MRN and tests to be scheduled, requested a call back  08/29/17-Rcvd call from [facility] [place], she said Pt will need to see a cardiologist prior to TEE. Am putting in ref to [facility] [place], [name] is to let nurses know to expect referral, of TEE order al well as 30 day event monitor.[name] was very helpful.

## 2017-08-29 ENCOUNTER — Encounter (HOSPITAL_COMMUNITY): Payer: Self-pay

## 2017-08-29 ENCOUNTER — Ambulatory Visit (HOSPITAL_COMMUNITY): Payer: Medicare Other | Attending: Internal Medicine

## 2017-08-29 DIAGNOSIS — I69352 Hemiplegia and hemiparesis following cerebral infarction affecting left dominant side: Secondary | ICD-10-CM | POA: Insufficient documentation

## 2017-08-29 DIAGNOSIS — R29898 Other symptoms and signs involving the musculoskeletal system: Secondary | ICD-10-CM | POA: Insufficient documentation

## 2017-08-29 DIAGNOSIS — M6281 Muscle weakness (generalized): Secondary | ICD-10-CM | POA: Insufficient documentation

## 2017-08-29 LAB — LIPID PANEL WITH LDL/HDL RATIO
Cholesterol, Total: 131 mg/dL (ref 100–199)
HDL: 51 mg/dL (ref >39)
LDL Calculated: 68 mg/dL (ref 0–99)
LDl/HDL Ratio: 1.3 ratio (ref 0.0–3.6)
Triglycerides: 61 mg/dL (ref 0–149)
VLDL CHOLESTEROL CAL: 12 mg/dL (ref 5–40)

## 2017-08-29 NOTE — Addendum Note (Signed)
Addended by: Dorthy CoolerBURNS, Lakesia Dahle J on: 08/29/2017 09:00 AM   Modules accepted: Orders

## 2017-08-30 ENCOUNTER — Telehealth: Payer: Self-pay

## 2017-08-30 ENCOUNTER — Telehealth (HOSPITAL_COMMUNITY): Payer: Self-pay

## 2017-08-30 NOTE — Telephone Encounter (Signed)
l/m to R/S missed PT apptment.NF 08/30/17

## 2017-08-30 NOTE — Telephone Encounter (Signed)
-----   Message from Drema DallasAdam R Jaffe, DO sent at 08/29/2017  6:45 AM EST ----- Cholesterol looks okay, so I would continue Lipitor 80mg  daily.

## 2017-08-30 NOTE — Telephone Encounter (Signed)
Called and spoke with Pt, advsd him of lab results and to continue Lipitor.

## 2017-09-01 ENCOUNTER — Ambulatory Visit (HOSPITAL_COMMUNITY): Payer: Medicare Other | Admitting: Occupational Therapy

## 2017-09-01 ENCOUNTER — Other Ambulatory Visit: Payer: Self-pay

## 2017-09-01 ENCOUNTER — Encounter (HOSPITAL_COMMUNITY): Payer: Self-pay | Admitting: Occupational Therapy

## 2017-09-01 DIAGNOSIS — N200 Calculus of kidney: Secondary | ICD-10-CM | POA: Diagnosis not present

## 2017-09-01 DIAGNOSIS — I69352 Hemiplegia and hemiparesis following cerebral infarction affecting left dominant side: Secondary | ICD-10-CM

## 2017-09-01 DIAGNOSIS — R29898 Other symptoms and signs involving the musculoskeletal system: Secondary | ICD-10-CM | POA: Diagnosis not present

## 2017-09-01 DIAGNOSIS — M6281 Muscle weakness (generalized): Secondary | ICD-10-CM | POA: Diagnosis not present

## 2017-09-01 NOTE — Therapy (Signed)
[facility] [address] [place], [place], [address] Phone: [phone]   Fax:  [phone]  Occupational Therapy Evaluation  Patient Details  Name: Brendan Charles MRN: [id number] Date of Birth: 01-11-1956 Referring Provider: Dr. Elfredia Nevins   Encounter Date: 09/01/2017  OT End of Session - 09/01/17 1216    Visit Number  1    Number of Visits  1    Date for OT Re-Evaluation  09/06/17    Authorization Type  [employer] Medicare    Authorization Time Period  $40 copay    OT Start Time  [phone]    OT Stop Time  1030    OT Time Calculation (min)  48 min    Activity Tolerance  Patient tolerated treatment well    Behavior During Therapy  [facility] for tasks assessed/performed       Past Medical History:  Diagnosis Date  . Anxiety   . Chronic back pain   . CVA (cerebral vascular accident) (HCC) 03/20/2017  . Hepatitis C    HEP C, never treated, 2007  . Hypertension     Past Surgical History:  Procedure Laterality Date  . CERVICAL DISC SURGERY  2001   anterior  . COLONOSCOPY WITH PROPOFOL N/A 03/14/2017   Procedure: COLONOSCOPY WITH PROPOFOL;  Surgeon: West Bali, MD;  [facility];  Service: Endoscopy;  Laterality: N/A;  8:15am  . CYSTOSCOPY W/ URETERAL STENT PLACEMENT Left 03/23/2017   Procedure: CYSTOSCOPY WITH LEFT RETROGRADE PYELOGRAM/URETERAL LEFT STENT PLACEMENT;  Surgeon: Malen Gauze, MD;  [facility];  Service: Urology;  Laterality: Left;  . CYSTOSCOPY/RETROGRADE/URETEROSCOPY Left 07/17/2017   Procedure: CYSTOSCOPY/RETROGRADE/URETEROSCOPY, stent removal, stone extraction, stent replacement;  Surgeon: Malen Gauze, MD;  [facility]: [facility];  Service: Urology;  Laterality: Left;  . HOLMIUM LASER APPLICATION Left 07/17/2017   Procedure: HOLMIUM LASER APPLICATION;  Surgeon: Malen Gauze, MD;  [facility];  Service: Urology;  Laterality: Left;  . LUMBAR SPINE SURGERY  2015  . POLYPECTOMY  03/14/2017    Procedure: POLYPECTOMY;  Surgeon: West Bali, MD;  [facility];  Service: Endoscopy;;  rectum    There were no vitals filed for this visit.  Subjective Assessment - 09/01/17 1214    Subjective   S: I can't feel anything on this left side.     Patient is accompained by:  Family member son    Pertinent History  Pt is a 62 y/o male s/p right CVA on 04/12/2017 presenting with left hemiplegia limiting independence in ADL completion. Pt was referred to occupational therapy for evaluation and treatment by Dr. Elfredia Nevins. Pt is unable to afford copay for visits therefore is requesting HEP.     Patient Stated Goals  To be more independent    Currently in Pain?  No/denies        [facility] OT Assessment - 09/01/17 0929      Assessment   Medical Diagnosis  s/p right CVA    Referring Provider  Dr. Elfredia Nevins    Onset Date/Surgical Date  04/11/17    Hand Dominance  Right    Prior Therapy  CIR 04/13/17-05/11/17; HH OT/PT/SLP 05/2017-08/2017      Precautions   Precautions  Fall      Restrictions   Weight Bearing Restrictions  No      Balance Screen   Has the patient fallen in the past 6 months  Yes    How many times?  1    Has the patient had a decrease in activity level because of a fear of falling?   No    Is the patient reluctant to leave their home because of a fear of falling?   No      Home  Environment   Family/patient expects to be discharged to:  Private residence    Living Arrangements  Children son      Prior Function   Level of Independence  Independent with basic ADLs    Vocation  Retired    Leisure  play with dog-pitbull        ADL   ADL comments  Pt is having diffculty with opening packages, fixing meals, fatigues easily. Is afraid to go into kitchen due to small dog in the room. Son reports pt is unable to fix any of his own meals      Written Expression   Dominant Hand  Right      Vision - History   Baseline Vision  No visual deficits       Cognition   Overall Cognitive Status  Within Functional Limits for tasks assessed      Observation/Other Assessments   Focus on Therapeutic Outcomes (FOTO)   To be completed with PT      Sensation   Light Touch  Impaired Detail    Light Touch Impaired Details  Impaired LUE    Semmes Weinstein Monofilament Scale  Diminished Protective 3.61 along median nerve      Coordination   Gross Motor Movements are Fluid and Coordinated  Yes    Fine Motor Movements are Fluid and Coordinated  Yes    9 Hole Peg Test  Right;Left    Right 9 Hole Peg Test  37.26"    Left 9 Hole Peg Test  38.93"      ROM / Strength   AROM / PROM / Strength  AROM;Strength      AROM   Overall AROM   Within functional limits for tasks performed    AROM Assessment Site  Shoulder    Right/Left Shoulder  Left      Strength   Overall Strength Comments  BUE strength is grossly 4-/5    Strength Assessment Site  Shoulder;Elbow;Hand    Right/Left Shoulder  Left    Left Shoulder Flexion  4-/5    Left Shoulder ABduction  4-/5    Left Shoulder Internal Rotation  4-/5    Left Shoulder External Rotation  4-/5    Right/Left Elbow  Left    Left Elbow Flexion  4/5    Left Elbow Extension  4/5    Right/Left hand  Left;Right    Right Hand Gross Grasp  Functional    Right Hand Grip (lbs)  66    Right Hand Lateral Pinch  17 lbs    Right Hand 3 Point Pinch  11 lbs    Left Hand Gross Grasp  Functional    Left Hand Grip (lbs)  40    Left Hand Lateral Pinch  11 lbs    Left Hand 3 Point Pinch  11 lbs               OT Treatments/Exercises (OP) - 09/01/17 1251      Exercises   Exercises  Shoulder      Shoulder Exercises: Seated   Protraction  Strengthening;10 reps    Protraction Weight (lbs)  1    Horizontal ABduction  Strengthening;10 reps  Horizontal ABduction Weight (lbs)  1    External Rotation  Strengthening;10 reps    External Rotation Weight (lbs)  1    Internal Rotation  Strengthening;10 reps     Internal Rotation Weight (lbs)  1    Flexion  Strengthening;10 reps    Flexion Weight (lbs)  1    Abduction  Strengthening;10 reps    ABduction Weight (lbs)  1            OT Education - 09/01/17 1015    Education provided  Yes    Education Details  BUE strengthening exercises-supine, seated, and sidelying    Person(s) Educated  Patient;Child(ren)    Methods  Explanation;Demonstration;Handout    Comprehension  Verbalized understanding;Returned demonstration                 Plan - 09/01/17 1245    Clinical Impression Statement  A: Pt is a 62 y/o male s/p right CVA on 04/12/2017 presenting with left hemiplegia limiting independence in ADL completion. On evaluation demonstrates BUE weakness, left sensation deficits (protective deficits), and decreased left grip and pinch strength. Pt's coordination is [facility] and pt reportedly has little to no difficulty with fine motor tasks. Pt and son educated on BUE strengthening to improve strength and activity tolerance required for ADL completion. Pt provided with HEP as he is unable to attend visits due to high copay.     Occupational Profile and client history currently impacting functional performance  Pt is motivated to return to highest level of functioning.     Occupational performance deficits (Please refer to evaluation for details):  ADL's;IADL's;Leisure;Social Participation    Rehab Potential  Good    OT Frequency  One time visit    OT Treatment/Interventions  Patient/family education;Therapeutic exercise    Plan  P: Pt educated on HEP for improved strength required for ADL and functional task completion. Also reviewed current grip strengthening exercises provided by [facility]. Pt demonstrates good form and completion of strengthening exercises.     Clinical Decision Making  Limited treatment options, no task modification necessary       Patient will benefit from skilled therapeutic intervention in order to improve the following deficits  and impairments:  Decreased strength, Decreased balance, Decreased activity tolerance, Impaired UE functional use, Decreased safety awareness  Visit Diagnosis: Spastic hemiparesis of left dominant side as late effect of cerebral infarction (HCC)  Muscle weakness (generalized)  Other symptoms and signs involving the musculoskeletal system    Problem List Patient Active Problem List   Diagnosis Date Noted  . Spastic hemiparesis of left dominant side as late effect of cerebral infarction (HCC) 04/19/2017  . Gait disturbance, post-stroke 04/18/2017  . Adjustment disorder with depressed mood   . Cerebral infarction due to thrombosis of right middle cerebral artery (HCC) 04/13/2017  . Weakness   . Acute ischemic stroke (HCC) 04/12/2017  . Stroke (cerebrum) (HCC) 04/12/2017  . Acute CVA (cerebrovascular accident) (HCC) 04/12/2017  . CVA (cerebral vascular accident) (HCC) 03/20/2017  . Hydronephrosis, left 03/20/2017  . Aortic atherosclerosis (HCC) 03/20/2017  . Hepatitis C 02/14/2017  . Encounter for screening colonoscopy 02/14/2017  . Spondylolisthesis at L3-L4 level 06/03/2014   Ezra Sites, OTR/L  [phone] 09/01/2017, 12:52 PM  [facility] [address] [place], [place], [address] Phone: [phone]   Fax:  [phone]  Name: MENASHE KAFER MRN: [id number] Date of Birth: [birth year]

## 2017-09-01 NOTE — Patient Instructions (Signed)
-  Repeat all exercises 10-15 times, 1-2 times per day. Can add weight to exercises when able, begin with a low weight and progress to greater weight as tolerated.   1) Shoulder Protraction    Begin with elbows by your side, slowly "punch" straight out in front of you.      2) Shoulder Flexion  Supine:     Standing:         Begin with arms at your side with thumbs pointed up, slowly raise both arms up and forward towards overhead.               3) Horizontal abduction/adduction  Supine:   Standing:           Begin with arms straight out in front of you, bring out to the side in at "T" shape. Keep arms straight entire time.                 4) Internal & External Rotation    *No band* -Stand with elbows at the side and elbows bent 90 degrees. Move your forearms away from your body, then bring back inward toward the body.     5) Shoulder Abduction  Supine:     Standing:       Lying on your back begin with your arms flat on the table next to your side. Slowly move your arms out to the side so that they go overhead, in a jumping jack or snow angel movement.     Side Lying Exercises: Complete 10-15X each, 1-2x/day   1) Sidelying Flexion:   Lie on your side with your affected arm up. Start with the weight in your top hand by your side. Lift the arm forward and pull your shoulder blade down as the arm lifts up (like a seesaw). NO WEIGHT     2) Sidelying abduction:   Lie on your side with your arm down straight at your side.  Raise the arm up and overhead, keeping the elbow straight.  You may turn the palm forward and inward if you can.     3) Sidelying horizontal abduction:   Lie on your side with arm straight up towards ceiling. Lower the arm straight out to face the wall and bring back up towards ceiling.     4) Sidelying internal/external rotation:   Lie on side with elbow bent. Lower and raise forearm in direction of the  ceiling, keeping elbow bent and by the side.

## 2017-09-05 ENCOUNTER — Ambulatory Visit (HOSPITAL_COMMUNITY): Payer: Medicare Other

## 2017-09-07 ENCOUNTER — Telehealth: Payer: Self-pay | Admitting: Cardiology

## 2017-09-07 ENCOUNTER — Other Ambulatory Visit: Payer: Self-pay | Admitting: Cardiology

## 2017-09-07 ENCOUNTER — Telehealth: Payer: Self-pay

## 2017-09-07 ENCOUNTER — Ambulatory Visit: Payer: Medicare Other | Admitting: Cardiology

## 2017-09-07 ENCOUNTER — Other Ambulatory Visit: Payer: Self-pay

## 2017-09-07 DIAGNOSIS — I639 Cerebral infarction, unspecified: Secondary | ICD-10-CM

## 2017-09-07 NOTE — Telephone Encounter (Signed)
Called pt. No answer. Left message for pt to return call.  

## 2017-09-07 NOTE — Telephone Encounter (Signed)
Spoke wit pt., he was confused and did not understand the test instructions I was giving him over the phone,. He stated that he had already received a heart monitor, but did not want to wear it until he was finished with all of his doctor appointments. He did not understand how to put it on. He did say he has a neighbor to help him understand it and he will ask for their help. I told him to call me back if he had any further questions. I mailed a copy of TEE instructions to pt.

## 2017-09-07 NOTE — Telephone Encounter (Signed)
-----   Message from Dyane Dustmanerry L Goins sent at 09/07/2017  3:45 PM EST ----- Regarding: FW: TEE  Changes to previous staff message. Had to reschedule TEE.  Dr.Branch - TEE to be done 09/19/17 @ 0930. Please put in orders                           prior to procedure.    Misty StanleyLisa - Please advise patient to arrive @ [facility] Stay [facility] @ 0800                 and all other instructions.  Thanks, Aurther Lofterry   ----- Message ----- From: Dyane DustmanGoins, Terry L Sent: 09/07/2017  12:54 PM To: Jonelle SidleSamuel G McDowell, MD, Antoine PocheJonathan F Branch, MD, # Subject: TEE                                            Dr. Diona BrownerMcDowell - TEE to be done 09/15/17 @ 0900 per Dr.Branch.  Dr. Wyline MoodBranch - Please make sure orders are in for this procedure.  Misty StanleyLisa - Please advise patient to arrive @ Short Stay Center @ 0730 and all other instructions.  Thanks, Newell Rubbermaiderry

## 2017-09-07 NOTE — Progress Notes (Deleted)
Clinical Summary [name] is a 62 y.o.male   1. CVA - recent stroke -   Past Medical History:  Diagnosis Date  . Anxiety   . Chronic back pain   . CVA (cerebral vascular accident) (HCC) 03/20/2017  . Hepatitis C    HEP C, never treated, 2007  . Hypertension      Allergies  Allergen Reactions  . Codeine Itching and Nausea Only     Current Outpatient Medications  Medication Sig Dispense Refill  . ALPRAZolam (XANAX) 0.5 MG tablet Take 0.5 mg by mouth 3 (three) times daily as needed for anxiety.    Marland Kitchen aspirin 325 MG tablet Take 1 tablet (325 mg total) by mouth daily. 30 tablet 12  . atorvastatin (LIPITOR) 80 MG tablet Take 1 tablet (80 mg total) by mouth at bedtime. 30 tablet 3  . clopidogrel (PLAVIX) 75 MG tablet Take 1 tablet (75 mg total) by mouth daily with breakfast. 30 tablet 3  . ketorolac (TORADOL) 10 MG tablet Take 1 tablet (10 mg total) by mouth every 6 (six) hours as needed. (Patient not taking: Reported on 08/28/2017) 20 tablet 0  . Maltodextrin-Xanthan Gum (RESOURCE THICKENUP CLEAR) POWD Take 360 g by mouth as needed. (Patient not taking: Reported on 07/07/2017) 3 Can 1  . metoprolol tartrate (LOPRESSOR) 25 MG tablet Take 1 tablet (25 mg total) by mouth 2 (two) times daily. 60 tablet 0  . mirtazapine (REMERON SOL-TAB) 15 MG disintegrating tablet Take 1 tablet (15 mg total) by mouth at bedtime. 30 tablet 0  . oxyCODONE-acetaminophen (PERCOCET) 7.5-325 MG tablet Take 1 tablet by mouth every 4 (four) hours as needed for severe pain. 30 tablet 0  . tamsulosin (FLOMAX) 0.4 MG CAPS capsule Take 1 capsule (0.4 mg total) by mouth daily. (Patient not taking: Reported on 08/28/2017) 30 capsule 0   No current facility-administered medications for this visit.      Past Surgical History:  Procedure Laterality Date  . CERVICAL DISC SURGERY  2001   anterior  . COLONOSCOPY WITH PROPOFOL N/A 03/14/2017   Procedure: COLONOSCOPY WITH PROPOFOL;  Surgeon: West Bali, MD;   [facility];  Service: Endoscopy;  Laterality: N/A;  8:15am  . CYSTOSCOPY W/ URETERAL STENT PLACEMENT Left 03/23/2017   Procedure: CYSTOSCOPY WITH LEFT RETROGRADE PYELOGRAM/URETERAL LEFT STENT PLACEMENT;  Surgeon: Malen Gauze, MD;  Location: [facility];  Service: Urology;  Laterality: Left;  . CYSTOSCOPY/RETROGRADE/URETEROSCOPY Left 07/17/2017   Procedure: CYSTOSCOPY/RETROGRADE/URETEROSCOPY, stent removal, stone extraction, stent replacement;  Surgeon: Malen Gauze, MD;  Location: [facility];  Service: Urology;  Laterality: Left;  . HOLMIUM LASER APPLICATION Left 07/17/2017   Procedure: HOLMIUM LASER APPLICATION;  Surgeon: Malen Gauze, MD;  Location: [facility];  Service: Urology;  Laterality: Left;  . LUMBAR SPINE SURGERY  2015  . POLYPECTOMY  03/14/2017   Procedure: POLYPECTOMY;  Surgeon: West Bali, MD;  [facility];  Service: Endoscopy;;  rectum     Allergies  Allergen Reactions  . Codeine Itching and Nausea Only      Family History  Problem Relation Age of Onset  . Cancer Mother   . Dementia Sister   . Colon cancer Neg Hx      Social History [name] reports that he quit smoking about 6 months ago. His smoking use included cigarettes. He has a 22.50 pack-year smoking history. he has never used smokeless tobacco. [name] reports that he does not drink alcohol.  Review of Systems CONSTITUTIONAL: No weight loss, fever, chills, weakness or fatigue.  HEENT: Eyes: No visual loss, blurred vision, double vision or yellow sclerae.No hearing loss, sneezing, congestion, runny nose or sore throat.  SKIN: No rash or itching.  CARDIOVASCULAR:  RESPIRATORY: No shortness of breath, cough or sputum.  GASTROINTESTINAL: No anorexia, nausea, vomiting or diarrhea. No abdominal pain or blood.  GENITOURINARY: No burning on urination, no polyuria NEUROLOGICAL: No headache, dizziness, syncope, paralysis, ataxia, numbness or tingling in the extremities.  No change in bowel or bladder control.  MUSCULOSKELETAL: No muscle, back pain, joint pain or stiffness.  LYMPHATICS: No enlarged nodes. No history of splenectomy.  PSYCHIATRIC: No history of depression or anxiety.  ENDOCRINOLOGIC: No reports of sweating, cold or heat intolerance. No polyuria or polydipsia.  Marland Kitchen.   Physical Examination There were no vitals filed for this visit. There were no vitals filed for this visit.  Gen: resting comfortably, no acute distress HEENT: no scleral icterus, pupils equal round and reactive, no palptable cervical adenopathy,  CV Resp: Clear to auscultation bilaterally GI: abdomen is soft, non-tender, non-distended, normal bowel sounds, no hepatosplenomegaly MSK: extremities are warm, no edema.  Skin: warm, no rash Neuro:  no focal deficits Psych: appropriate affect   Diagnostic Studies     Assessment and Plan        Antoine PocheJonathan F. Branch, M.D., F.A.C.C.

## 2017-09-07 NOTE — Telephone Encounter (Signed)
-----   Message from Terry L Goins sent at 09/07/2017  3:45 PM EST ----- Regarding: FW: TEE  Changes to previous staff message. Had to reschedule TEE.  Dr.Branch - TEE to be done 09/19/17 @ 0930. Please put in orders                           prior to procedure.    Kallen Delatorre - Please advise patient to arrive @ Short Stay Center @ 0800                 and all other instructions.  Thanks, Terry   ----- Message ----- From: Goins, Terry L Sent: 09/07/2017  12:54 PM To: Samuel G McDowell, MD, Jonathan F Branch, MD, # Subject: TEE                                            Dr. McDowell - TEE to be done 09/15/17 @ 0900 per Dr.Branch.  Dr. Branch - Please make sure orders are in for this procedure.  Isabela Nardelli - Please advise patient to arrive @ Short Stay Center @ 0730 and all other instructions.  Thanks, Terry  

## 2017-09-07 NOTE — Progress Notes (Signed)
Neurology has requested a TEE and 30 day event monitor for stroke, a clinic consultation was also placed for this. Patient does not need to be seen clinically to arrange studies, we will d/c consult and arrange the outpatient TEE and monitor.   Dina RichJonathan Jimy Gates MD

## 2017-09-08 ENCOUNTER — Telehealth: Payer: Self-pay | Admitting: Neurology

## 2017-09-08 ENCOUNTER — Ambulatory Visit (HOSPITAL_COMMUNITY): Payer: Medicare Other | Attending: Internal Medicine

## 2017-09-08 DIAGNOSIS — R262 Difficulty in walking, not elsewhere classified: Secondary | ICD-10-CM | POA: Insufficient documentation

## 2017-09-08 DIAGNOSIS — I69354 Hemiplegia and hemiparesis following cerebral infarction affecting left non-dominant side: Secondary | ICD-10-CM | POA: Insufficient documentation

## 2017-09-08 NOTE — Therapy (Signed)
[facility] [address] [place], [place], [address] Phone: [phone]   Fax:  [phone]  Physical Therapy Evaluation  Patient Details  Name: Brendan Charles MRN: [id number] Date of Birth: [birth year] Referring Provider: Dr. Elfredia Nevins    Encounter Date: 09/08/2017  PT End of Session - 09/08/17 1557    Visit Number  1    Number of Visits  2    Date for PT Re-Evaluation  11/06/17    Authorization Type  [employer] medicare     Authorization Time Period  09/08/17----11/06/17    PT Start Time  1434    PT Stop Time  1545    PT Time Calculation (min)  71 min    Activity Tolerance  Patient tolerated treatment well;Patient limited by fatigue    Behavior During Therapy  [facility] for tasks assessed/performed       Past Medical History:  Diagnosis Date  . Anxiety   . Chronic back pain   . CVA (cerebral vascular accident) (HCC) 03/20/2017  . Hepatitis C    HEP C, never treated, 2007  . Hypertension     Past Surgical History:  Procedure Laterality Date  . CERVICAL DISC SURGERY  2001   anterior  . COLONOSCOPY WITH PROPOFOL N/A 03/14/2017   Procedure: COLONOSCOPY WITH PROPOFOL;  Surgeon: West Bali, MD;  [facility];  Service: Endoscopy;  Laterality: N/A;  8:15am  . CYSTOSCOPY W/ URETERAL STENT PLACEMENT Left 03/23/2017   Procedure: CYSTOSCOPY WITH LEFT RETROGRADE PYELOGRAM/URETERAL LEFT STENT PLACEMENT;  Surgeon: Malen Gauze, MD;  Location: [facility];  Service: Urology;  Laterality: Left;  . CYSTOSCOPY/RETROGRADE/URETEROSCOPY Left 07/17/2017   Procedure: CYSTOSCOPY/RETROGRADE/URETEROSCOPY, stent removal, stone extraction, stent replacement;  Surgeon: Malen Gauze, MD;  Location: [facility];  Service: Urology;  Laterality: Left;  . HOLMIUM LASER APPLICATION Left 07/17/2017   Procedure: HOLMIUM LASER APPLICATION;  Surgeon: Malen Gauze, MD;  [facility]: [facility];  Service: Urology;  Laterality: Left;  . LUMBAR SPINE SURGERY   2015  . POLYPECTOMY  03/14/2017   Procedure: POLYPECTOMY;  Surgeon: West Bali, MD;  [facility];  Service: Endoscopy;;  rectum    There were no vitals filed for this visit.   Subjective Assessment - 09/08/17 1437    Subjective  Pt reports sustaining a CVA in August 2018 with Right sided deficits, nearly fully resolved after a few weeks with HHPT, then sustained a second CVA affecting the left side more severely in September 2018 (8M later) with complete left sided numbness that has persisted. He is unable to say if he has weakness on the left b/c the numbness. After the first stroke he was able to tolerate community distance AMB without AD, just below PLOF, but since the September CVA, pt had several months of HHPT, then DC, and then he had Left sided kidney stone that required hospitalization and subseqeuntly underwent HHPT x1 month s/p DC. He reports he comes to PT today because OT told him he should, but the only thing he is really strugglign with is walking at this point.     Currently in Pain?  Yes    Pain Score  4     Pain Location  -- low back (chronic LBP, no worse than prior to CVA)         [facility] PT Assessment - 09/08/17 0001      Assessment   Medical Diagnosis  s/p CVA with Left hemiplegia  Referring Provider  Dr. Elfredia NevinsLawrence Fusco     Onset Date/Surgical Date  04/11/17    Hand Dominance  Right    Prior Therapy  CIR 04/13/17-05/11/17; HH OT/PT/SLP 05/2017-08/2017      Precautions   Precautions  Fall      Restrictions   Weight Bearing Restrictions  No      Balance Screen   Has the patient fallen in the past 6 months  Yes    How many times?  1    Has the patient had a decrease in activity level because of a fear of falling?   No    Is the patient reluctant to leave their home because of a fear of falling?   No      Prior Function   Level of Independence  Independent with basic ADLs    Vocation  Retired      Observation/Other Assessments   Focus on  Therapeutic Outcomes (FOTO)   FOTO: 50% ( 50% impaired)       Functional Tests   Functional tests  Sit to Stand      Tone   Assessment Location  Right Lower Extremity;Left Lower Extremity      ROM / Strength   AROM / PROM / Strength  PROM;Strength      Strength   Strength Assessment Site  Hip;Knee;Ankle    Right/Left Hip  Right;Left    Right Hip Flexion  4+/5    Right Hip External Rotation   4/5    Right Hip Internal Rotation  4/5    Right Hip ABduction  -- horizontal ABDCT: 4/5    Right Hip ADduction  4/5    Left Hip Flexion  4+/5    Left Hip External Rotation  3+/5    Left Hip Internal Rotation  3+/5    Left Hip ABduction  -- horizontal ABDCT: 4-/5    Left Hip ADduction  4/5    Right/Left Knee  Right;Left    Right Knee Flexion  5/5    Right Knee Extension  4+/5    Left Knee Flexion  4-/5    Left Knee Extension  5/5    Right/Left Ankle  Right;Left    Right Ankle Dorsiflexion  5/5    Right Ankle Plantar Flexion  4+/5 seated solueus    Left Ankle Dorsiflexion  3+/5      Transfers   Five time sit to stand comments   5x STS: 14.85sec  with hands on arms of chair; noted hip extension weakness       Ambulation/Gait   Ambulation Distance (Feet)  10 Feet    Assistive device  Rolling walker    Gait velocity  0.641m/s       RLE Tone   RLE Tone  Modified Ashworth      RLE Tone   Modified Ashworth Scale for Grading Hypertonia RLE  Slight increase in muscle tone, manifested by a catch and release or by minimal resistance at the end of the range of motion when the affected part(s) is moved in flexion or extension ankle plantarflexors       LLE Tone   LLE Tone  Modified Ashworth      LLE Tone   Modified Ashworth Scale for Grading Hypertonia LLE  Slight increase in muscle tone, manifested by a catch, followed by minimal resistance throughout the remainder (less than half) of the ROM knee flexors, ankle plantarflexors  Objective measurements completed on  examination: See above findings.      [facility] Adult PT Treatment/Exercise - 09/08/17 0001      Exercises   Exercises  Knee/Hip      Knee/Hip Exercises: Aerobic   Nustep  7 minutes, level 1, seat/arms on level 10  Education on performance at [facility] at DC       Knee/Hip Exercises: Standing   Other Standing Knee Exercises  Marching in place: BUE support: 1x10 STS: 1x10 from 25" high surface    Other Standing Knee Exercises  Ankle Plantarflexion: 1x10  Ankle dorsiflexion; 1x10       Knee/Hip Exercises: Seated   Other Seated Knee/Hip Exercises  seated BUE floor touch and trunk extension, hip hinge:  Chair in front for safety: 1x10       Knee/Hip Exercises: Supine   Bridges with Clamshell  1 set;Both 1x8     Other Supine Knee/Hip Exercises  bent knee raises: 1x10 bilat             PT Education - 09/08/17 1551    Education provided  Yes    Education Details  importance of checking skin intergrity of left foot, use of AFO bilat, importance of HEP progression, differentiating cardio and resistance training, safe performance of HEP in home.     Person(s) Educated  Patient;Child(ren)    Methods  Explanation;Demonstration;Tactile cues;Verbal cues;Handout    Comprehension  Need further instruction       PT Short Term Goals - 09/08/17 1609      PT SHORT TERM GOAL #1   Title  After 2 weeks pt will demonstrate independent performance of HEP with correct form as shown in eval and detailed on sheet.     Time  2    Period  Weeks    Status  New    Target Date  09/22/17        PT Long Term Goals - 09/08/17 1610      PT LONG TERM GOAL #1   Title  After 8 weeks patient will demonstrate improved AMB tolerance covering 439ft c RW @ 0.23m/s or greater.     Time  8    Period  Weeks    Status  New    Target Date  11/03/17      PT LONG TERM GOAL #2   Title  After 8 weeks patient will demonstrate improved 5xSTS performance hands free in < 15seconds.     Time  8    Period  Weeks     Status  New    Target Date  11/03/17      PT LONG TERM GOAL #3   Title  After 8 weeks patient will demonstrate improved BLE strength AEB MMT of 4/5 or greater in all groups.     Time  8    Period  Weeks    Status  New    Target Date  11/03/17               Patient will benefit from skilled therapeutic intervention in order to improve the following deficits and impairments:     Visit Diagnosis: Hemiplegia and hemiparesis following cerebral infarction affecting left non-dominant side (HCC)  Difficulty in walking, not elsewhere classified     Problem List Patient Active Problem List   Diagnosis Date Noted  . Spastic hemiparesis of left dominant side as late effect of cerebral infarction (HCC) 04/19/2017  . Gait disturbance, post-stroke 04/18/2017  . Adjustment  disorder with depressed mood   . Cerebral infarction due to thrombosis of right middle cerebral artery (HCC) 04/13/2017  . Weakness   . Acute ischemic stroke (HCC) 04/12/2017  . Stroke (cerebrum) (HCC) 04/12/2017  . Acute CVA (cerebrovascular accident) (HCC) 04/12/2017  . CVA (cerebral vascular accident) (HCC) 03/20/2017  . Hydronephrosis, left 03/20/2017  . Aortic atherosclerosis (HCC) 03/20/2017  . Hepatitis C 02/14/2017  . Encounter for screening colonoscopy 02/14/2017  . Spondylolisthesis at L3-L4 level 06/03/2014   4:19 PM, 09/08/17 Rosamaria Lints, PT, DPT Physical Therapist - [place] [phone] (Office)    Rosamaria Lints 09/08/2017, 4:19 PM  [place] [facility] [address] [place], [place], [address] Phone: [phone]   Fax:  [phone]  Name: Brendan Charles MRN: [id number] Date of Birth: 1956/07/23

## 2017-09-08 NOTE — Telephone Encounter (Signed)
Called and spoke with Brendan Charles, Brendan Charles him I had spoken with Pt on 08/30/17 and advsd him cholesterol looked good and to continie the 80mg  Lipitor.

## 2017-09-08 NOTE — Telephone Encounter (Signed)
Pt's son Brendan Charles called and said pt had some blood work done and has not heard back from anyone about some medication changes

## 2017-09-11 DIAGNOSIS — E782 Mixed hyperlipidemia: Secondary | ICD-10-CM | POA: Diagnosis not present

## 2017-09-11 DIAGNOSIS — I10 Essential (primary) hypertension: Secondary | ICD-10-CM | POA: Diagnosis not present

## 2017-09-11 DIAGNOSIS — I69352 Hemiplegia and hemiparesis following cerebral infarction affecting left dominant side: Secondary | ICD-10-CM | POA: Diagnosis not present

## 2017-09-11 DIAGNOSIS — Z6822 Body mass index (BMI) 22.0-22.9, adult: Secondary | ICD-10-CM | POA: Diagnosis not present

## 2017-09-11 DIAGNOSIS — I700 Atherosclerosis of aorta: Secondary | ICD-10-CM | POA: Diagnosis not present

## 2017-09-11 DIAGNOSIS — I639 Cerebral infarction, unspecified: Secondary | ICD-10-CM | POA: Diagnosis not present

## 2017-09-11 DIAGNOSIS — J329 Chronic sinusitis, unspecified: Secondary | ICD-10-CM | POA: Diagnosis not present

## 2017-09-19 ENCOUNTER — Other Ambulatory Visit: Payer: Self-pay

## 2017-09-19 ENCOUNTER — Encounter (HOSPITAL_COMMUNITY): Payer: Self-pay | Admitting: Anesthesiology

## 2017-09-19 ENCOUNTER — Ambulatory Visit (HOSPITAL_COMMUNITY)
Admission: RE | Admit: 2017-09-19 | Discharge: 2017-09-19 | Disposition: A | Discharge status: Home / Self Care | Payer: Medicare Other | Source: Ambulatory Visit | Attending: Cardiology | Admitting: Cardiology

## 2017-09-19 ENCOUNTER — Ambulatory Visit (HOSPITAL_BASED_OUTPATIENT_CLINIC_OR_DEPARTMENT_OTHER): Payer: Medicare Other

## 2017-09-19 ENCOUNTER — Encounter (HOSPITAL_COMMUNITY)
Admission: RE | Disposition: A | Discharge status: Home / Self Care | Payer: Self-pay | Source: Ambulatory Visit | Attending: Cardiology

## 2017-09-19 ENCOUNTER — Encounter (HOSPITAL_COMMUNITY): Payer: Self-pay | Admitting: *Deleted

## 2017-09-19 DIAGNOSIS — I10 Essential (primary) hypertension: Secondary | ICD-10-CM | POA: Diagnosis not present

## 2017-09-19 DIAGNOSIS — I6389 Other cerebral infarction: Secondary | ICD-10-CM | POA: Diagnosis not present

## 2017-09-19 DIAGNOSIS — Z885 Allergy status to narcotic agent status: Secondary | ICD-10-CM | POA: Insufficient documentation

## 2017-09-19 DIAGNOSIS — I071 Rheumatic tricuspid insufficiency: Secondary | ICD-10-CM | POA: Diagnosis not present

## 2017-09-19 DIAGNOSIS — Z86010 Personal history of colonic polyps: Secondary | ICD-10-CM | POA: Diagnosis not present

## 2017-09-19 DIAGNOSIS — Z8619 Personal history of other infectious and parasitic diseases: Secondary | ICD-10-CM | POA: Diagnosis not present

## 2017-09-19 DIAGNOSIS — I639 Cerebral infarction, unspecified: Secondary | ICD-10-CM

## 2017-09-19 DIAGNOSIS — Z7982 Long term (current) use of aspirin: Secondary | ICD-10-CM | POA: Diagnosis not present

## 2017-09-19 DIAGNOSIS — Z79899 Other long term (current) drug therapy: Secondary | ICD-10-CM | POA: Diagnosis not present

## 2017-09-19 DIAGNOSIS — Z8673 Personal history of transient ischemic attack (TIA), and cerebral infarction without residual deficits: Secondary | ICD-10-CM | POA: Diagnosis not present

## 2017-09-19 DIAGNOSIS — Z87891 Personal history of nicotine dependence: Secondary | ICD-10-CM | POA: Diagnosis not present

## 2017-09-19 HISTORY — PX: TEE WITHOUT CARDIOVERSION: SHX5443

## 2017-09-19 SURGERY — ECHOCARDIOGRAM, TRANSESOPHAGEAL
Anesthesia: Moderate Sedation

## 2017-09-19 MED ORDER — SODIUM CHLORIDE 0.9 % IV SOLN
INTRAVENOUS | Status: DC
  Administered 2017-09-19: 09:00:00 via INTRAVENOUS

## 2017-09-19 MED ORDER — MIDAZOLAM HCL 5 MG/5ML IJ SOLN
INTRAMUSCULAR | Status: DC | PRN
  Administered 2017-09-19 (×4): 1 mg via INTRAVENOUS
  Administered 2017-09-19: 0.5 mg via INTRAVENOUS

## 2017-09-19 MED ORDER — FENTANYL CITRATE (PF) 100 MCG/2ML IJ SOLN
INTRAMUSCULAR | Status: AC
  Filled 2017-09-19: qty 2

## 2017-09-19 MED ORDER — FENTANYL CITRATE (PF) 100 MCG/2ML IJ SOLN
INTRAMUSCULAR | Status: DC | PRN
  Administered 2017-09-19: 25 ug via INTRAVENOUS
  Administered 2017-09-19: 50 ug via INTRAVENOUS
  Administered 2017-09-19: 25 ug via INTRAVENOUS

## 2017-09-19 MED ORDER — MIDAZOLAM HCL 5 MG/5ML IJ SOLN
INTRAMUSCULAR | Status: AC
  Filled 2017-09-19: qty 10

## 2017-09-19 MED ORDER — LIDOCAINE VISCOUS 2 % MT SOLN
OROMUCOSAL | Status: DC | PRN
  Administered 2017-09-19: 4 mL via OROMUCOSAL

## 2017-09-19 MED ORDER — SODIUM CHLORIDE BACTERIOSTATIC 0.9 % IJ SOLN
INTRAMUSCULAR | Status: AC
  Filled 2017-09-19: qty 10

## 2017-09-19 MED ORDER — BUTAMBEN-TETRACAINE-BENZOCAINE 2-2-14 % EX AERO
INHALATION_SPRAY | CUTANEOUS | Status: AC
  Filled 2017-09-19: qty 5

## 2017-09-19 MED ORDER — LIDOCAINE VISCOUS 2 % MT SOLN
OROMUCOSAL | Status: AC
  Filled 2017-09-19: qty 15

## 2017-09-19 MED ORDER — SODIUM CHLORIDE BACTERIOSTATIC 0.9 % IJ SOLN
INTRAMUSCULAR | Status: DC | PRN
  Administered 2017-09-19: 9 mL

## 2017-09-19 NOTE — H&P (Signed)
Procedure H&P  Patient referred for TEE for recent CVA. Will plan procedure for today under moderate sedation. Please refer to full H&P posted below for medical history   [name] MD    NEUROLOGY CONSULTATION NOTE  BRAXSTON QUINTER MRN: [id number] DOB: 09/24/1955  Referring provider: Linford Arnold, PA-C Primary care provider: Elfredia Nevins, MD  Reason for consult:  stroke  HISTORY OF PRESENT ILLNESS: Brendan Charles is a 62 year old right-handed male with tobacco use, hypertension and history of prior strokes and  hepatitis C presents for recent strokes.  He is accompanied by his son who supplements history.  History is also supplemented by hospital records.    He was admitted to [facility] from 03/20/17 to 03/21/17 for stroke.  He underwent colonscopy a week prior to admission and developed slurred speech.  A few days later, he developed right sided weakness and speech difficulty but did not seek immediate medical attention.  The following day, he developed left lower quadrant pain and then went to the ED.  MRI of brain revealed acute left basal ganglia ischemic infarct extending towards the corona radiata with associated high-grade stenosis or occlusion of the anterior inferior left M2 segment on MRA.  It also revealed moderate to high-grade stenosis of the proximal right vertebral artery with distal flow beyond the PICA as well as 1.5 mm aneurysm or more likely infundibulum of the left PCOM artery.  Carotid doppler revealed small plaque in left ICA but no hemodynamically significant ICA stenosis bilaterally.  TTE showed EF 60-65% with no atrial septal defect/PFO or other cardiac source of emboli.  LDL was 89, Hgb A1c 5.5, HIV antibody negative, RPR negative, TSH 2.892, and B12 318.  He was started on ASA 325mg  and Plavix 75mg  and atorvastatin 80mg .    He was readmitted to [facility] from 04/12/17 to 04/13/17 for sudden onset left upper extremity numbness and  weakness.  Due to recent stroke, he was not tPA candidate.  MRI of brain revealed acute right medullary lacunar ischemic infarct.  CTA of head and neck again revealed severe tandem right V4 vertebral artery stenosis proximal to PICA origin but with patent carotid arteries and intracranial vasculature.  He was continued on dual antiplatelet therapy with Lipitor 80mg .  He was discharged to rehab for 1 week.  He again presented to the ED on 05/18/17 after waking up in the morning with new left sided numbness and weakness.  CT of head showed chronic lacunar infarcts but nothing acute.  MRI of brain showed expected evolution of his recent left basal ganglia/corona radiata and right medullary infarcts but no acute findings.  MRA of head was again notable for unchanged severe tandem right V4 vertebral artery stenosis.  He is back at home.  He has finished speech therapy.  He continues PT/OT.  He has quit smoking since the first stroke in August.     PAST MEDICAL HISTORY:     Past Medical History:  Diagnosis Date  . Anxiety   . Chronic back pain   . CVA (cerebral vascular accident) (HCC) 03/20/2017  . Hepatitis C    HEP C, never treated, 2007  . Hypertension     PAST SURGICAL HISTORY:      Past Surgical History:  Procedure Laterality Date  . CERVICAL DISC SURGERY  2001   anterior  . COLONOSCOPY WITH PROPOFOL N/A 03/14/2017   Procedure: COLONOSCOPY WITH PROPOFOL;  Surgeon: West Bali, MD;  Location: [facility];  Service: Endoscopy;  Laterality: N/A;  8:15am  . CYSTOSCOPY W/ URETERAL STENT PLACEMENT Left 03/23/2017   Procedure: CYSTOSCOPY WITH LEFT RETROGRADE PYELOGRAM/URETERAL LEFT STENT PLACEMENT;  Surgeon: Malen GauzeMcKenzie, Patrick L, MD;  Location: [facility];  Service: Urology;  Laterality: Left;  . CYSTOSCOPY/RETROGRADE/URETEROSCOPY Left 07/17/2017   Procedure: CYSTOSCOPY/RETROGRADE/URETEROSCOPY, stent removal, stone extraction, stent replacement;  Surgeon: Malen GauzeMcKenzie, Patrick L, MD;   Location: [facility];  Service: Urology;  Laterality: Left;  . HOLMIUM LASER APPLICATION Left 07/17/2017   Procedure: HOLMIUM LASER APPLICATION;  Surgeon: Malen GauzeMcKenzie, Patrick L, MD;  Location: [facility];  Service: Urology;  Laterality: Left;  . LUMBAR SPINE SURGERY  2015  . POLYPECTOMY  03/14/2017   Procedure: POLYPECTOMY;  Surgeon: West BaliFields, Sandi L, MD;  Location: [facility];  Service: Endoscopy;;  rectum    MEDICATIONS:       Current Outpatient Medications on File Prior to Visit  Medication Sig Dispense Refill  . ALPRAZolam (XANAX) 0.5 MG tablet Take 0.5 mg by mouth 3 (three) times daily as needed for anxiety.    Marland Kitchen. aspirin 325 MG tablet Take 1 tablet (325 mg total) by mouth daily. 30 tablet 12  . ketorolac (TORADOL) 10 MG tablet Take 1 tablet (10 mg total) by mouth every 6 (six) hours as needed. (Patient not taking: Reported on 08/28/2017) 20 tablet 0  . Maltodextrin-Xanthan Gum (RESOURCE THICKENUP CLEAR) POWD Take 360 g by mouth as needed. (Patient not taking: Reported on 07/07/2017) 3 Can 1  . metoprolol tartrate (LOPRESSOR) 25 MG tablet Take 1 tablet (25 mg total) by mouth 2 (two) times daily. 60 tablet 0  . mirtazapine (REMERON SOL-TAB) 15 MG disintegrating tablet Take 1 tablet (15 mg total) by mouth at bedtime. 30 tablet 0  . oxyCODONE-acetaminophen (PERCOCET) 7.5-325 MG tablet Take 1 tablet by mouth every 4 (four) hours as needed for severe pain. 30 tablet 0  . tamsulosin (FLOMAX) 0.4 MG CAPS capsule Take 1 capsule (0.4 mg total) by mouth daily. (Patient not taking: Reported on 08/28/2017) 30 capsule 0   No current facility-administered medications on file prior to visit.     ALLERGIES:     Allergies  Allergen Reactions  . Codeine Itching and Nausea Only    FAMILY HISTORY:      Family History  Problem Relation Age of Onset  . Cancer Mother   . Dementia Sister   . Colon cancer Neg Hx     SOCIAL HISTORY: Social History        Socioeconomic History  . Marital  status: Divorced    Spouse name: Not on file  . Number of children: Not on file  . Years of education: Not on file  . Highest education level: Associate degree: academic program  Social Needs  . Financial resource strain: Not on file  . Food insecurity - worry: Not on file  . Food insecurity - inability: Not on file  . Transportation needs - medical: Not on file  . Transportation needs - non-medical: Not on file  Occupational History  . Occupation: retired    Comment: former Naval architecttruck driver  Tobacco Use  . Smoking status: Former Smoker    Packs/day: 0.50    Years: 45.00    Pack years: 22.50    Types: Cigarettes    Last attempt to quit: 03/2017    Years since quitting: 0.4  . Smokeless tobacco: Never Used  . Tobacco comment: pt states quit smoking 1 wk ago  Substance and Sexual Activity  . Alcohol  use: No  . Drug use: No  . Sexual activity: Not Currently    Birth control/protection: None  Other Topics Concern  . Not on file  Social History Narrative   Divorced, lives with son in a 2 story home, though Pt stays in 1st level. He drinks one cup of coffee and one soda a day. Is to begin O/P P/T next week.    REVIEW OF SYSTEMS: Constitutional: No fevers, chills, or sweats, no generalized fatigue, change in appetite Eyes: No visual changes, double vision, eye pain Ear, nose and throat: No hearing loss, ear pain, nasal congestion, sore throat Cardiovascular: No chest pain, palpitations Respiratory:  No shortness of breath at rest or with exertion, wheezes GastrointestinaI: No nausea, vomiting, diarrhea, abdominal pain, fecal incontinence Genitourinary:  No dysuria, urinary retention or frequency Musculoskeletal:  No neck pain, back pain Integumentary: No rash, pruritus, skin lesions Neurological: as above Psychiatric: No depression, insomnia, anxiety Endocrine: No palpitations, fatigue, diaphoresis, mood swings, change in appetite, change in weight, increased  thirst Hematologic/Lymphatic:  No purpura, petechiae. Allergic/Immunologic: no itchy/runny eyes, nasal congestion, recent allergic reactions, rashes  PHYSICAL EXAM:    Vitals:   08/28/17 0925  BP: (!) 160/102  Pulse: 74  SpO2: 98%   General: No acute distress.  Patient appears well-groomed.  Head:  Normocephalic/atraumatic Eyes:  fundi examined but not visualized Neck: supple, no paraspinal tenderness, full range of motion Back: No paraspinal tenderness Heart: regular rate and rhythm Lungs: Clear to auscultation bilaterally. Vascular: No carotid bruits. Neurological Exam: Mental status: alert and oriented to person, place, and time, recent and remote memory intact, fund of knowledge intact, attention and concentration intact, speech fluent but dysarthric, language intact. Cranial nerves: CN I: not tested CN II: pupils equal, round and reactive to light, visual fields intact CN III, IV, VI:  full range of motion, no nystagmus, no ptosis CN V: facial sensation intact CN VII: upper and lower face symmetric CN VIII: hearing intact CN IX, X: gag intact, uvula midline CN XI: sternocleidomastoid and trapezius muscles intact CN XII: tongue midline Bulk & Tone: normal, no fasciculations. Motor:  4+/5 left triceps, otherwise 5/5 throughout.   Sensation:  Pinprick sensation reduced in left upper and lower extremities and vibration sensation intact. Deep Tendon Reflexes:  3+ throughout but more brisk in the left upper and lower extremities, toes downgoing.  Finger to nose testing:  Without dysmetria.   Heel to shin:  Without dysmetria.  Gait:  Ataxic gait.  Unable to ambulate or turn without walker.  Unable to tandem walk. Romberg positive.  IMPRESSION: 1.  Multiple strokes or TIAs.  The strokes themselves look like secondary to small vessel disease.  Risk of recurrent stroke is highest in the first 3 months after initial stroke.  However, since he has had several, I think we should  evaluate cardiac source further.   2.  Hypertension   PLAN: 1.  Continue ASA 325mg  daily and Plavix 75mg  daily for secondary stroke prevention. 2.  Continue Lipitor 80mg  daily.  Repeat lipid panel.  3.  Optimize blood pressure control.  Elevated today.  Advised to contact Dr. Sharyon Medicus office to make necessary medication adjustments. 4.  We will check TEE and 30 day event monitor to evaluate for cardiac source. 5.  Follow up in 3 months.  Thank you for allowing me to take part in the care of this patient.  Shon Millet, DO

## 2017-09-19 NOTE — Progress Notes (Signed)
*  PRELIMINARY RESULTS* Echocardiogram Echocardiogram Transesophageal has been performed.  Stacey DrainWhite, Brandi Tomlinson J 09/19/2017, 10:51 AM

## 2017-09-19 NOTE — CV Procedure (Signed)
Procedure note  Procedure: TEE Phsyician: Dr Dina RichJonathan Mizael Sagar  Indication: Stroke/CVA   Patient was brought to the procedure suite after appropriate consent was obtained. The posterior oropharnyx was anesthesized with 2% viscous lidocaine. The patient was placed in the lateral decibitus position with bite guard in place. Moderate sedation was achieved with 4.5 mg of versed and 125mc of fentanyl. The TEE probe was intubated into the stomach without difficulty and several images were obtained. Cardiopulmonary monitoring was performed throughout the procedure, he tolerated procedure without complications  Preliminary findings: no intracardiac thrombus. Questionable atherosclerotic plaque in the proximcal ascending aorta, consider CTA to better define.     Dina RichJonathan Jahnay Lantier MD

## 2017-09-19 NOTE — Discharge Instructions (Signed)
Resume prior home medications. Discharge home when recovery is complete.    Transesophageal Echocardiogram Transesophageal echocardiography (TEE) is a picture test of your heart using sound waves. The pictures taken can give very detailed pictures of your heart. This can help your doctor see if there are problems with your heart. TEE can check:  If your heart has blood clots in it.  How well your heart valves are working.  If you have an infection on the inside of your heart.  Some of the major arteries of your heart.  If your heart valve is working after a Psychologist, forensicrepair.  Your heart before a procedure that uses a shock to your heart to get the rhythm back to normal.  What happens before the procedure?  Do not eat or drink for 6 hours before the procedure or as told by your doctor.  Make plans to have someone drive you home after the procedure. Do not drive yourself home.  An IV tube will be put in your arm. What happens during the procedure?  You will be given a medicine to help you relax (sedative). It will be given through the IV tube.  A numbing medicine will be sprayed or gargled in the back of your throat to help numb it.  The tip of the probe is placed into the back of your mouth. You will be asked to swallow. This helps to pass the probe into your esophagus.  Once the tip of the probe is in the right place, your doctor can take pictures of your heart.  You may feel pressure at the back of your throat. What happens after the procedure?  You will be taken to a recovery area so the sedative can wear off.  Your throat may be sore and scratchy. This will go away slowly over time.  You will go home when you are fully awake and able to swallow liquids.  You should have someone stay with you for the next 24 hours.  Do not drive or operate machinery for the next 24 hours. This information is not intended to replace advice given to you by your health care provider. Make sure you  discuss any questions you have with your health care provider. Document Released: 05/22/2009 Document Revised: 12/31/2015 Document Reviewed: 01/24/2013 Elsevier Interactive Patient Education  2018 [employer]   Moderate Conscious Sedation, Adult, Care After These instructions provide you with information about caring for yourself after your procedure. Your health care provider may also give you more specific instructions. Your treatment has been planned according to current medical practices, but problems sometimes occur. Call your health care provider if you have any problems or questions after your procedure. What can I expect after the procedure? After your procedure, it is common:  To feel sleepy for several hours.  To feel clumsy and have poor balance for several hours.  To have poor judgment for several hours.  To vomit if you eat too soon.  Follow these instructions at home: For at least 24 hours after the procedure:   Do not: ? Participate in activities where you could fall or become injured. ? Drive. ? Use heavy machinery. ? Drink alcohol. ? Take sleeping pills or medicines that cause drowsiness. ? Make important decisions or sign legal documents. ? Take care of children on your own.  Rest. Eating and drinking  Follow the diet recommended by your health care provider.  If you vomit: ? Drink water, juice, or soup when you can drink  without vomiting. ? Make sure you have little or no nausea before eating solid foods. General instructions  Have a responsible adult stay with you until you are awake and alert.  Take over-the-counter and prescription medicines only as told by your health care provider.  If you smoke, do not smoke without supervision.  Keep all follow-up visits as told by your health care provider. This is important. Contact a health care provider if:  You keep feeling nauseous or you keep vomiting.  You feel light-headed.  You develop a  rash.  You have a fever. Get help right away if:  You have trouble breathing. This information is not intended to replace advice given to you by your health care provider. Make sure you discuss any questions you have with your health care provider. Document Released: 05/15/2013 Document Revised: 12/28/2015 Document Reviewed: 11/14/2015 Elsevier Interactive Patient Education  [employer].

## 2017-09-22 ENCOUNTER — Encounter (HOSPITAL_COMMUNITY): Payer: Self-pay | Admitting: Cardiology

## 2017-10-09 DIAGNOSIS — I639 Cerebral infarction, unspecified: Secondary | ICD-10-CM | POA: Diagnosis not present

## 2017-10-27 DIAGNOSIS — I69352 Hemiplegia and hemiparesis following cerebral infarction affecting left dominant side: Secondary | ICD-10-CM | POA: Diagnosis not present

## 2017-10-27 DIAGNOSIS — Z1389 Encounter for screening for other disorder: Secondary | ICD-10-CM | POA: Diagnosis not present

## 2017-10-27 DIAGNOSIS — Z6823 Body mass index (BMI) 23.0-23.9, adult: Secondary | ICD-10-CM | POA: Diagnosis not present

## 2017-10-27 DIAGNOSIS — I10 Essential (primary) hypertension: Secondary | ICD-10-CM | POA: Diagnosis not present

## 2017-10-30 ENCOUNTER — Ambulatory Visit (HOSPITAL_COMMUNITY): Payer: Medicare Other | Attending: Internal Medicine

## 2017-10-30 DIAGNOSIS — I69354 Hemiplegia and hemiparesis following cerebral infarction affecting left non-dominant side: Secondary | ICD-10-CM | POA: Diagnosis not present

## 2017-10-30 DIAGNOSIS — R262 Difficulty in walking, not elsewhere classified: Secondary | ICD-10-CM | POA: Insufficient documentation

## 2017-10-30 NOTE — Therapy (Addendum)
PHYSICAL THERAPY DISCHARGE SUMMARY  Visits from Start of Care: 2  Current functional level related to goals / functional outcomes: *See below. Pt did not return to PT as he sustained a fall and ankle fracture.    Remaining deficits: *see below    Education / Equipment: N/A Plan: Patient agrees to discharge.  Patient goals were partially met. Patient is being discharged due to a change in medical status.  ?????          3:26 PM, 01/30/18 Etta Grandchild, PT, DPT Physical Therapist at [facility] [phone] (office)                [place] [facility] [address] [place], [place], [address] Phone: [phone]   Fax:  [phone]  Physical Therapy Treatment  Patient Details  Name: Brendan Charles MRN: [id number] Date of Birth: 06/10/1956 Referring Provider: Redmond School, MD    Encounter Date: 10/30/2017  PT End of Session - 10/30/17 1538    Visit Number  2    Number of Visits  4    Date for PT Re-Evaluation  12/30/17 Reassessment and FOTO needed on 4/25    Authorization Type  [employer] medicare     Authorization Time Period  09/08/17----11/06/17; 10/30/17-12/30/17    PT Start Time  1302    PT Stop Time  1349    PT Time Calculation (min)  47 min       Past Medical History:  Diagnosis Date  . Anxiety   . Chronic back pain   . CVA (cerebral vascular accident) ([facility]) 03/20/2017  . Hepatitis C    HEP C, never treated, 2007  . Hypertension     Past Surgical History:  Procedure Laterality Date  . CERVICAL DISC SURGERY  2001   anterior  . COLONOSCOPY WITH PROPOFOL N/A 03/14/2017   Procedure: COLONOSCOPY WITH PROPOFOL;  Surgeon: Danie Binder, MD;  [facility];  Service: Endoscopy;  Laterality: N/A;  8:15am  . CYSTOSCOPY W/ URETERAL STENT PLACEMENT Left 03/23/2017   Procedure: CYSTOSCOPY WITH LEFT RETROGRADE PYELOGRAM/URETERAL LEFT STENT PLACEMENT;  Surgeon: Cleon Gustin,  MD;  [facility]: [facility];  Service: Urology;  Laterality: Left;  . CYSTOSCOPY/RETROGRADE/URETEROSCOPY Left 07/17/2017   Procedure: CYSTOSCOPY/RETROGRADE/URETEROSCOPY, stent removal, stone extraction, stent replacement;  Surgeon: Cleon Gustin, MD;  [facility];  Service: Urology;  Laterality: Left;  . HOLMIUM LASER APPLICATION Left 32/44/0102   Procedure: HOLMIUM LASER APPLICATION;  Surgeon: Cleon Gustin, MD;  [facility];  Service: Urology;  Laterality: Left;  . LUMBAR SPINE SURGERY  2015  . POLYPECTOMY  03/14/2017   Procedure: POLYPECTOMY;  Surgeon: Danie Binder, MD;  [facility];  Service: Endoscopy;;  rectum  . TEE WITHOUT CARDIOVERSION N/A 09/19/2017   Procedure: TRANSESOPHAGEAL ECHOCARDIOGRAM (TEE);  Surgeon: Arnoldo Lenis, MD;  [facility];  Service: Endoscopy;  Laterality: N/A;    There were no vitals filed for this visit.  Subjective Assessment - 10/30/17 1309    Subjective  Pt reports he has been working diligently at home with HEP, but other exercises as well. He reports he has bene getting on a stationary bike at home x3 minutes each day. He has trialed a SPC in the house. He has continued to perform most of his walking in the house, largely d/t weather.     Currently in Pain?  No/denies  [facility] PT Assessment - 10/30/17 0001      Assessment   Medical Diagnosis  s/p CVA with Left hemiplegia     Referring Provider  Redmond School, MD     Onset Date/Surgical Date  04/11/17    Hand Dominance  Right    Prior Therapy  CIR 04/13/17-05/11/17; HH OT/PT/SLP 05/2017-08/2017      Precautions   Precautions  Fall      Balance Screen   Has the patient fallen in the past 6 months  -- no falls since eval 6WA, several close calls but catches sel    Has the patient had a decrease in activity level because of a fear of falling?   No    Is the patient reluctant to leave their home because of a fear of falling?   Yes       Observation/Other Assessments   Focus on Therapeutic Outcomes (FOTO)   FOTO: 60% (40%impaired)   50% at eval      Functional Tests   Functional tests  Sit to Stand      Strength   Right Hip Flexion  4+/5    Right Hip External Rotation   5/5    Right Hip Internal Rotation  5/5    Right Hip ABduction  -- horizontal ABDCT: 5/5    Left Hip Flexion  4+/5    Left Hip External Rotation  4/5    Left Hip Internal Rotation  4/5    Left Hip ABduction  -- horizontal ABDCT: 5/5    Right Knee Flexion  5/5    Right Knee Extension  5/5    Left Knee Flexion  4+/5    Left Knee Extension  5/5      Transfers   Five time sit to stand comments   5xSTS, hands on chair armsL 11.5sec 5x STS: 14.85sec       Ambulation/Gait   Ambulation Distance (Feet)  120 Feet    Assistive device  Rolling walker    Gait velocity  0.32ms  0.344m at eval            No data recorded       [facility] Adult PT Treatment/Exercise - 10/30/17 0001      Knee/Hip Exercises: Seated   Other Seated Knee/Hip Exercises  seated BUE floor touch and trunk extension, hip hinge:  Chair in front for safety: 1x10     Sit to Sand  3 sets;10 reps;without UE support from elevated surface               PT Short Term Goals - 10/30/17 1412      PT SHORT TERM GOAL #1   Title  After 2 weeks pt will demonstrate independent performance of HEP with correct form as shown in eval and detailed on sheet.     Time  2    Period  Weeks    Status  Achieved    Target Date  09/22/17        PT Long Term Goals - 10/30/17 1532      PT LONG TERM GOAL #1   Title  After 8 weeks patient will demonstrate improved AMB tolerance covering 45018f RW @ 0.67m58mr greater.     Baseline  significant speed improvements, but distances limited to ~225fe57fer bout.     Time  12    Period  Weeks    Status  On-going    Target Date  11/30/17  PT LONG TERM GOAL #2   Title  After 12 weeks patient will demonstrate improved 5xSTS performance  hands free in < 15seconds.     Baseline  can perform from elevated surface, but unable from standard chair height.     Time  8    Period  Weeks    Status  Partially Met    Target Date  11/30/17      PT LONG TERM GOAL #3   Title  After 8 weeks patient will demonstrate improved BLE strength AEB MMT of 4/5 or greater in all groups.     Time  8    Period  Weeks    Status  Achieved    Target Date  11/03/17            Plan - 10/30/17 1539    Clinical Impression Statement  Pt returns are initital evaluation, several weeks out as planned. Pt has been diligent with dailycompliance with HEP and other additional exercises as well. Today hedomestres improved strength AEB isolated MMT, improved ambulation speed by 40%, and improved global strength AEB 5xSTS. His HEP is modified to his current ability, education is given on reducin gresistance on recumbent bike to allow for gradual progression of cycling time to 15 minute bout sover th ecoming weeks. Pt is educated on not performing cardio and strength training on same days. Pt is encouraged to gradually progress aMB distances outside of the house. Whereas at eval, the patient was ready to be done with PT after intensive acute and subacute rehab phases, he is now encouraged and motivated by his objective improvements, and hopeful to continue to further his progress. Hsi son attends the visit, also motivated to see his dad continue to progress in ability and independence in mobility. Plan is to recertify th epatient for 8 weeks, give him 4 weeks to adhere to progression schedule for cycling adn AMB, and update his new HEP. Pt making great progressoverall. Pt remains highly motivated and focused.     History and Personal Factors relevant to plan of care:  very motivated and independent with HEP     Clinical Presentation  Evolving    Clinical Presentation due to:  objective testsadn measures.     Clinical Decision Making  Moderate    Rehab Potential  Good     PT Frequency  Monthy    PT Duration  8 weeks    PT Treatment/Interventions  Balance training;Patient/family education;Gait training;Functional mobility training;Therapeutic activities;Therapeutic exercise;Passive range of motion    PT Next Visit Plan  Reassessment: 5xSTS hands free, 3 minute walk test; Progress HEP to include balance training, stair training if possilble.    PT Home Exercise Plan  STS transfers from 21" high surface, flexion to floor, progression schedule for AMB and for cycling, clam brdiges.     Consulted and Agree with Plan of Care  Patient;Family member/caregiver    Family Member Consulted  son        Patient will benefit from skilled therapeutic intervention in order to improve the following deficits and impairments:  Abnormal gait, Cardiopulmonary status limiting activity, Decreased activity tolerance, Decreased balance, Decreased coordination, Decreased mobility, Decreased knowledge of use of DME, Decreased strength, Difficulty walking, Postural dysfunction  Visit Diagnosis: Hemiplegia and hemiparesis following cerebral infarction affecting left non-dominant side ([facility]) - Plan: PT plan of care cert/re-cert  Difficulty in walking, not elsewhere classified - Plan: PT plan of care cert/re-cert     Problem List Patient Active Problem List  Diagnosis Date Noted  . Spastic hemiparesis of left dominant side as late effect of cerebral infarction ([facility]) 04/19/2017  . Gait disturbance, post-stroke 04/18/2017  . Adjustment disorder with depressed mood   . Cerebral infarction due to thrombosis of right middle cerebral artery ([facility]) 04/13/2017  . Weakness   . Acute ischemic stroke ([facility]) 04/12/2017  . Stroke (cerebrum) ([place]) 04/12/2017  . Acute CVA (cerebrovascular accident) ([facility]) 04/12/2017  . CVA (cerebral vascular accident) ([facility]) 03/20/2017  . Hydronephrosis, left 03/20/2017  . Aortic atherosclerosis ([facility]) 03/20/2017  . Hepatitis C 02/14/2017  . Encounter for  screening colonoscopy 02/14/2017  . Spondylolisthesis at L3-L4 level 06/03/2014    3:52 PM, 10/30/17 Etta Grandchild, PT, DPT Physical Therapist at [facility] [phone] (office)      Etta Grandchild 10/30/2017, 3:51 PM  [facility] [address] [place], [place], [address] Phone: [phone]   Fax:  [phone]  Name: Brendan Charles MRN: [id number] Date of Birth: 1955/08/22

## 2017-11-09 DIAGNOSIS — I639 Cerebral infarction, unspecified: Secondary | ICD-10-CM | POA: Diagnosis not present

## 2017-11-27 ENCOUNTER — Emergency Department (HOSPITAL_COMMUNITY): Payer: Medicare Other

## 2017-11-27 ENCOUNTER — Other Ambulatory Visit: Payer: Self-pay

## 2017-11-27 ENCOUNTER — Emergency Department (HOSPITAL_COMMUNITY)
Admission: EM | Admit: 2017-11-27 | Discharge: 2017-11-27 | Disposition: A | Discharge status: Home / Self Care | Payer: Medicare Other | Attending: Emergency Medicine | Admitting: Emergency Medicine

## 2017-11-27 ENCOUNTER — Encounter (HOSPITAL_COMMUNITY): Payer: Self-pay | Admitting: *Deleted

## 2017-11-27 DIAGNOSIS — S82892A Other fracture of left lower leg, initial encounter for closed fracture: Secondary | ICD-10-CM | POA: Diagnosis not present

## 2017-11-27 DIAGNOSIS — Z7902 Long term (current) use of antithrombotics/antiplatelets: Secondary | ICD-10-CM | POA: Diagnosis not present

## 2017-11-27 DIAGNOSIS — S8265XA Nondisplaced fracture of lateral malleolus of left fibula, initial encounter for closed fracture: Secondary | ICD-10-CM | POA: Diagnosis not present

## 2017-11-27 DIAGNOSIS — Y9301 Activity, walking, marching and hiking: Secondary | ICD-10-CM | POA: Insufficient documentation

## 2017-11-27 DIAGNOSIS — Y929 Unspecified place or not applicable: Secondary | ICD-10-CM | POA: Insufficient documentation

## 2017-11-27 DIAGNOSIS — Z8673 Personal history of transient ischemic attack (TIA), and cerebral infarction without residual deficits: Secondary | ICD-10-CM | POA: Diagnosis not present

## 2017-11-27 DIAGNOSIS — W1839XA Other fall on same level, initial encounter: Secondary | ICD-10-CM | POA: Insufficient documentation

## 2017-11-27 DIAGNOSIS — Y999 Unspecified external cause status: Secondary | ICD-10-CM | POA: Insufficient documentation

## 2017-11-27 DIAGNOSIS — Z87891 Personal history of nicotine dependence: Secondary | ICD-10-CM | POA: Diagnosis not present

## 2017-11-27 DIAGNOSIS — S99812A Other specified injuries of left ankle, initial encounter: Secondary | ICD-10-CM | POA: Diagnosis present

## 2017-11-27 DIAGNOSIS — Z7982 Long term (current) use of aspirin: Secondary | ICD-10-CM | POA: Insufficient documentation

## 2017-11-27 DIAGNOSIS — I10 Essential (primary) hypertension: Secondary | ICD-10-CM | POA: Insufficient documentation

## 2017-11-27 NOTE — ED Provider Notes (Signed)
[facility] EMERGENCY DEPARTMENT Provider Note   CSN: [id number] Arrival date & time: 11/27/17  1526     History   Chief Complaint Chief Complaint  Patient presents with  . Ankle Injury    HPI Brendan Charles is a 62 y.o. male.  Patient with history of prior stroke with residual left sided weakness. He normally ambulates with the assistance of a walker or a cane. Today he was walking outside with his cane when he fell (mechanical fall, denies dizziness or syncope), injuring his left ankle.  Patient complaining of pain to ankle with swelling noted on the lateral aspect.   The history is provided by the patient. No language interpreter was used.  Ankle Pain   The incident occurred at home. The injury mechanism was a fall. The pain is present in the left ankle. The quality of the pain is described as throbbing. The pain is moderate. The pain has been fluctuating since onset. Associated symptoms include inability to bear weight. He reports no foreign bodies present. The symptoms are aggravated by bearing weight.    Past Medical History:  Diagnosis Date  . Anxiety   . Chronic back pain   . CVA (cerebral vascular accident) ([facility]) 03/20/2017  . Hepatitis C    HEP C, never treated, 2007  . Hypertension     Patient Active Problem List   Diagnosis Date Noted  . Spastic hemiparesis of left dominant side as late effect of cerebral infarction ([facility]) 04/19/2017  . Gait disturbance, post-stroke 04/18/2017  . Adjustment disorder with depressed mood   . Cerebral infarction due to thrombosis of right middle cerebral artery ([facility]) 04/13/2017  . Weakness   . Acute ischemic stroke ([facility]) 04/12/2017  . Stroke (cerebrum) ([facility]) 04/12/2017  . Acute CVA (cerebrovascular accident) ([facility]) 04/12/2017  . CVA (cerebral vascular accident) ([facility]) 03/20/2017  . Hydronephrosis, left 03/20/2017  . Aortic atherosclerosis ([facility]) 03/20/2017  . Hepatitis C 02/14/2017  . Encounter for screening colonoscopy  02/14/2017  . Spondylolisthesis at L3-L4 level 06/03/2014    Past Surgical History:  Procedure Laterality Date  . CERVICAL DISC SURGERY  2001   anterior  . COLONOSCOPY WITH PROPOFOL N/A 03/14/2017   Procedure: COLONOSCOPY WITH PROPOFOL;  Surgeon: West Bali, MD;  [facility];  Service: Endoscopy;  Laterality: N/A;  8:15am  . CYSTOSCOPY W/ URETERAL STENT PLACEMENT Left 03/23/2017   Procedure: CYSTOSCOPY WITH LEFT RETROGRADE PYELOGRAM/URETERAL LEFT STENT PLACEMENT;  Surgeon: Malen Gauze, MD;  [facility];  Service: Urology;  Laterality: Left;  . CYSTOSCOPY/RETROGRADE/URETEROSCOPY Left 07/17/2017   Procedure: CYSTOSCOPY/RETROGRADE/URETEROSCOPY, stent removal, stone extraction, stent replacement;  Surgeon: Malen Gauze, MD;  [facility];  Service: Urology;  Laterality: Left;  . HOLMIUM LASER APPLICATION Left 07/17/2017   Procedure: HOLMIUM LASER APPLICATION;  Surgeon: Malen Gauze, MD;  [facility];  Service: Urology;  Laterality: Left;  . LUMBAR SPINE SURGERY  2015  . POLYPECTOMY  03/14/2017   Procedure: POLYPECTOMY;  Surgeon: [place] Bali, MD;  [facility];  Service: Endoscopy;;  rectum  . TEE WITHOUT CARDIOVERSION N/A 09/19/2017   Procedure: TRANSESOPHAGEAL ECHOCARDIOGRAM (TEE);  Surgeon: Antoine Poche, MD;  [facility];  Service: Endoscopy;  Laterality: N/A;        Home Medications    Prior to Admission medications   Medication Sig Start Date End Date Taking? Authorizing Provider  ALPRAZolam Prudy Feeler) 0.5 MG tablet Take 0.5 mg by mouth 3 (three) times daily  as needed for anxiety.    [provider]  aspirin 325 MG tablet Take 1 tablet (325 mg total) by mouth daily. 03/22/17   Rhetta Mura, MD  atorvastatin (LIPITOR) 80 MG tablet Take 1 tablet (80 mg total) by mouth at bedtime. 08/28/17   Drema Dallas, DO  clopidogrel (PLAVIX) 75 MG tablet Take 1 tablet (75 mg total) by mouth daily with  breakfast. 08/28/17   Everlena Cooper, Adam R, DO  losartan (COZAAR) 50 MG tablet Take 50 mg by mouth daily.    [provider]  metoprolol tartrate (LOPRESSOR) 25 MG tablet Take 1 tablet (25 mg total) by mouth 2 (two) times daily. 05/12/17   Angiulli, Mcarthur Rossetti, PA-C  mirtazapine (REMERON SOL-TAB) 15 MG disintegrating tablet Take 1 tablet (15 mg total) by mouth at bedtime. 05/12/17   Angiulli, Mcarthur Rossetti, PA-C  oxyCODONE-acetaminophen (PERCOCET) 7.5-325 MG tablet Take 1 tablet by mouth every 4 (four) hours as needed for severe pain. 07/17/17   Malen Gauze, MD    Family History Family History  Problem Relation Age of Onset  . Cancer Mother   . Dementia Sister   . Colon cancer Neg Hx     Social History Social History   Tobacco Use  . Smoking status: Former Smoker    Packs/day: 0.50    Years: 45.00    Pack years: 22.50    Types: Cigarettes    Last attempt to quit: 03/2017    Years since quitting: 0.7  . Smokeless tobacco: Never Used  . Tobacco comment: pt states quit smoking 1 wk ago  Substance Use Topics  . Alcohol use: No  . Drug use: No     Allergies   Codeine   Review of Systems Review of Systems  Neurological: Positive for weakness. Negative for dizziness and syncope.  All other systems reviewed and are negative.    Physical Exam Updated Vital Signs BP (!) 157/91 (BP Location: Left Arm)   Pulse 93   Temp 99.1 F (37.3 C) (Tympanic)   Resp 18   Ht 5\' 9"  (1.753 m)   Wt 68 kg (150 lb)   SpO2 99%   BMI 22.15 kg/m   Physical Exam  Constitutional: He is oriented to person, place, and time. He appears well-developed and well-nourished.  HENT:  Head: Atraumatic.  Eyes: Conjunctivae are normal.  Neck: Neck supple.  Cardiovascular: Normal rate and regular rhythm.  Pulmonary/Chest: Effort normal and breath sounds normal.  Abdominal: Soft. He exhibits no distension. There is no tenderness.  Musculoskeletal: He exhibits edema and tenderness. He exhibits no  deformity.  Lymphadenopathy:    He has no cervical adenopathy.  Neurological: He is alert and oriented to person, place, and time.  Patient with history of stroke with residual left sided weakness. Mild (minimal) grip strength weakness noted on the left as compared to the right.  Skin: Skin is warm and dry.  Psychiatric: He has a normal mood and affect.  Nursing note and vitals reviewed.    ED Treatments / Results  Labs (all labs ordered are listed, but only abnormal results are displayed) Labs Reviewed - No data to display  EKG None  Radiology Dg Ankle Complete Left  Result Date: 11/27/2017 CLINICAL DATA:  Fall today.  Lateral left ankle pain. EXAM: LEFT ANKLE COMPLETE - 3+ VIEW COMPARISON:  None. FINDINGS: Prominent soft tissue swelling is present laterally. There is a fracture of the distal fibula that appears essentially nondisplaced. The ankle mortise shows  normal alignment. IMPRESSION: Lateral soft tissue swelling and nondisplaced lateral malleolar fracture. Electronically Signed   By: Irish LackGlenn  Yamagata M.D.   On: 11/27/2017 16:15    Procedures .Splint Application Date/Time: 11/27/2017 5:34 PM Performed by: Vilinda BlanksBethel, Sandra S, RN Authorized by: Felicie MornSmith, Malloree Raboin, NP   Consent:    Consent obtained:  Verbal   Consent given by:  Patient Pre-procedure details:    Sensation:  Normal Procedure details:    Laterality:  Left   Location:  Ankle   Ankle:  L ankle   Splint type: posterior ankle.   Supplies:  Cotton padding, Ortho-Glass and elastic bandage Post-procedure details:    Sensation:  Normal   Patient tolerance of procedure:  Tolerated well, no immediate complications Comments:     Splint applied by nursing staff.   (including critical care time)  Medications Ordered in ED Medications - No data to display   Initial Impression / Assessment and Plan / ED Course  I have reviewed the triage vital signs and the nursing notes.  Pertinent labs & imaging results that were  available during my care of the patient were reviewed by me and considered in my medical decision making (see chart for details).    Patient X-Ray positive for non-displaced lateral malleolar fracture   Pt advised to follow up with orthopedics. Patient placed into posterior ankle splint while in ED, crutches provided. Care instructions provided. Patient will be discharged home & is agreeable with above plan. Returns precautions discussed. Pt appears safe for discharge.  Final Clinical Impressions(s) / ED Diagnoses   Final diagnoses:  Closed fracture of left ankle, initial encounter    ED Discharge Orders    None       Felicie MornSmith, Katilin Raynes, NP 11/27/17 1947    Molpus, Jonny RuizJohn, MD 11/27/17 2022

## 2017-11-27 NOTE — ED Triage Notes (Signed)
Pt c/o fall with left ankle injury today. Pt has swelling to left ankle. Denies LOC.

## 2017-11-27 NOTE — ED Notes (Signed)
Splint verified/checked by ED Paula LibraJohn Molpus

## 2017-11-28 ENCOUNTER — Telehealth (HOSPITAL_COMMUNITY): Payer: Self-pay | Admitting: Internal Medicine

## 2017-11-28 NOTE — Telephone Encounter (Signed)
11/28/17  son called to cancel, said his dad fell yesterday at home and may need surgery

## 2017-11-30 ENCOUNTER — Ambulatory Visit (HOSPITAL_COMMUNITY): Payer: Medicare Other

## 2017-12-07 DIAGNOSIS — S82832A Other fracture of upper and lower end of left fibula, initial encounter for closed fracture: Secondary | ICD-10-CM | POA: Diagnosis not present

## 2017-12-08 DIAGNOSIS — I700 Atherosclerosis of aorta: Secondary | ICD-10-CM | POA: Diagnosis not present

## 2017-12-08 DIAGNOSIS — G894 Chronic pain syndrome: Secondary | ICD-10-CM | POA: Diagnosis not present

## 2017-12-09 DIAGNOSIS — I639 Cerebral infarction, unspecified: Secondary | ICD-10-CM | POA: Diagnosis not present

## 2017-12-11 ENCOUNTER — Ambulatory Visit: Payer: Medicare Other | Admitting: Neurology

## 2017-12-11 ENCOUNTER — Encounter: Payer: Self-pay | Admitting: Neurology

## 2017-12-11 VITALS — BP 128/74 | HR 82 | Ht 70.0 in | Wt 145.0 lb

## 2017-12-11 DIAGNOSIS — I10 Essential (primary) hypertension: Secondary | ICD-10-CM

## 2017-12-11 DIAGNOSIS — I639 Cerebral infarction, unspecified: Secondary | ICD-10-CM

## 2017-12-11 DIAGNOSIS — Q254 Congenital malformation of aorta unspecified: Secondary | ICD-10-CM

## 2017-12-11 MED ORDER — ATORVASTATIN CALCIUM 80 MG PO TABS: 80.0000 mg | ORAL_TABLET | Freq: Every day | ORAL | 5 refills | Status: DC

## 2017-12-11 MED ORDER — CLOPIDOGREL BISULFATE 75 MG PO TABS: 75.0000 mg | ORAL_TABLET | Freq: Every day | ORAL | 5 refills | Status: DC

## 2017-12-11 NOTE — Patient Instructions (Signed)
1.  Continue Plavix  daily and atorvastatin  daily.  Stop aspirin 2.  We will check 24 hour Holter monitor 3.  We will check CTA of chest  4.  Follow up in 6 months.

## 2017-12-11 NOTE — Progress Notes (Signed)
NEUROLOGY FOLLOW UP OFFICE NOTE  NAHUM SHERRER [id number]  HISTORY OF PRESENT ILLNESS: Brendan Charles is a 62 year old right-handed male with tobacco use, hypertension and history of prior strokes and hepatitis C who follows up for strokes and TIAs.  He is accompanied by his son who supplements history.  UPDATE: He is on ASA and Plavix.  He is taking atorvastatin  daily.  TEE from 09/19/17 demonstratee LV EF 55-60% with no PFO or atrial septal defect or evidence of intracardiac thrombus.  It did reveal questionable semi-mobile plaque in the proximal ascending aorta.  He did not have the 30 day cardiac event monitor due to the inconvenience.  He fell and fractured his left ankle.  HISTORY: He was admitted to [facility] from 03/20/17 to 03/21/17 for stroke.  He underwent colonscopy a week prior to admission and developed slurred speech.  A few days later, he developed right sided weakness and speech difficulty but did not seek immediate medical attention.  The following day, he developed left lower quadrant pain and then went to the ED.  MRI of brain revealed acute left basal ganglia ischemic infarct extending towards the corona radiata with associated high-grade stenosis or occlusion of the anterior inferior left M2 segment on MRA.  It also revealed moderate to high-grade stenosis of the proximal right vertebral artery with distal flow beyond the PICA as well as 1.5 mm aneurysm or more likely infundibulum of the left PCOM artery.  Carotid doppler revealed small plaque in left ICA but no hemodynamically significant ICA stenosis bilaterally.  TTE showed EF 60-65% with no atrial septal defect/PFO or other cardiac source of emboli.  LDL was 89, Hgb A1c 5.5, HIV antibody negative, RPR negative, TSH 2.892, and B12 318.  He was started on ASA  and Plavix  and atorvastatin .     He was readmitted to [facility] from 04/12/17 to 04/13/17 for sudden onset left upper extremity numbness  and weakness.  Due to recent stroke, he was not tPA candidate.  MRI of brain revealed acute right medullary lacunar ischemic infarct.  CTA of head and neck again revealed severe tandem right V4 vertebral artery stenosis proximal to PICA origin but with patent carotid arteries and intracranial vasculature.  He was continued on dual antiplatelet therapy with Lipitor .  He was discharged to rehab for 1 week.  He again presented to the ED on 05/18/17 after waking up in the morning with new left sided numbness and weakness.  CT of head showed chronic lacunar infarcts but nothing acute.  MRI of brain showed expected evolution of his recent left basal ganglia/corona radiata and right medullary infarcts but no acute findings.  MRA of head was again notable for unchanged severe tandem right V4 vertebral artery stenosis.  PAST MEDICAL HISTORY: Past Medical History:  Diagnosis Date  . Anxiety   . Chronic back pain   . CVA (cerebral vascular accident) (HCC) 03/20/2017  . Hepatitis C    HEP C, never treated, 2007  . Hypertension     MEDICATIONS: Current Outpatient Medications on File Prior to Visit  Medication Sig Dispense Refill  . ALPRAZolam (XANAX) 0.5 MG tablet Take 0.5 mg by mouth 3 (three) times daily as needed for anxiety.    Marland Kitchen amLODipine (NORVASC) 5 MG tablet Take 5 mg by mouth daily.  11  . aspirin 325 MG tablet Take 1 tablet (325 mg total) by mouth daily. 30 tablet 12  . losartan (COZAAR)  100 MG tablet Take 100 mg by mouth daily.  11  . metoprolol tartrate (LOPRESSOR) 25 MG tablet Take 1 tablet (25 mg total) by mouth 2 (two) times daily. 60 tablet 0  . mirtazapine (REMERON SOL-TAB) 15 MG disintegrating tablet Take 1 tablet (15 mg total) by mouth at bedtime. 30 tablet 0  . oxyCODONE-acetaminophen (PERCOCET) 7.5-325 MG tablet Take 1 tablet by mouth every 4 (four) hours as needed for severe pain. 30 tablet 0   No current facility-administered medications on file prior to visit.      ALLERGIES: Allergies  Allergen Reactions  . Codeine Itching and Nausea Only    FAMILY HISTORY: Family History  Problem Relation Age of Onset  . Cancer Mother   . Dementia Sister   . Colon cancer Neg Hx     SOCIAL HISTORY: Social History   Socioeconomic History  . Marital status: Divorced    Spouse name: Not on file  . Number of children: Not on file  . Years of education: Not on file  . Highest education level: Associate degree: academic program  Occupational History  . Occupation: retired    Comment: former truck Runner, broadcasting/film/video  . Financial resource strain: Not on file  . Food insecurity:    Worry: Not on file    Inability: Not on file  . Transportation needs:    Medical: Not on file    Non-medical: Not on file  Tobacco Use  . Smoking status: Former Smoker    Packs/day: 0.50    Years: 45.00    Pack years: 22.50    Types: Cigarettes    Last attempt to quit: 03/2017    Years since quitting: 0.7  . Smokeless tobacco: Never Used  . Tobacco comment: pt states quit smoking 1 wk ago  Substance and Sexual Activity  . Alcohol use: No  . Drug use: No  . Sexual activity: Not Currently    Birth control/protection: None  Lifestyle  . Physical activity:    Days per week: Not on file    Minutes per session: Not on file  . Stress: Not on file  Relationships  . Social connections:    Talks on phone: Not on file    Gets together: Not on file    Attends religious service: Not on file    Active member of club or organization: Not on file    Attends meetings of clubs or organizations: Not on file    Relationship status: Not on file  . Intimate partner violence:    Fear of current or ex partner: Not on file    Emotionally abused: Not on file    Physically abused: Not on file    Forced sexual activity: Not on file  Other Topics Concern  . Not on file  Social History Narrative   Divorced, lives with son in a 2 story home, though Pt stays in 1st level. He  drinks one cup of coffee and one soda a day. Is to begin O/P P/T next week.    REVIEW OF SYSTEMS: Constitutional: No fevers, chills, or sweats, no generalized fatigue, change in appetite Eyes: No visual changes, double vision, eye pain Ear, nose and throat: No hearing loss, ear pain, nasal congestion, sore throat Cardiovascular: No chest pain, palpitations Respiratory:  No shortness of breath at rest or with exertion, wheezes GastrointestinaI: No nausea, vomiting, diarrhea, abdominal pain, fecal incontinence Genitourinary:  No dysuria, urinary retention or frequency Musculoskeletal:  No neck pain,  back pain Integumentary: No rash, pruritus, skin lesions Neurological: as above Psychiatric: No depression, insomnia, anxiety Endocrine: No palpitations, fatigue, diaphoresis, mood swings, change in appetite, change in weight, increased thirst Hematologic/Lymphatic:  No purpura, petechiae. Allergic/Immunologic: no itchy/runny eyes, nasal congestion, recent allergic reactions, rashes  PHYSICAL EXAM: Vitals:   12/11/17 1405  BP: 128/74  Pulse: 82  SpO2: 96%   General: No acute distress. . Head:  Normocephalic/atraumatic Eyes:  Fundi examined but not visualized Neck: supple, no paraspinal tenderness, full range of motion Heart:  Regular rate and rhythm Lungs:  Clear to auscultation bilaterally Back: No paraspinal tenderness Neurological Exam: alert and oriented to person, place, and time. Attention span and concentration intact, recent and remote memory intact, fund of knowledge intact.  Speech slightly dysarthric but fluent, language intact.  CN II-XII intact. Bulk and tone normal, muscle strength 4+/5 left triceps but otherwise, 5/5 throughout.  Sensation to light touch  intact.  Deep tendon reflexes 3+ throughout but more brisk in left upper extremity.  Finger to nose testing intact.  Gait difficult to assess as in wheelchair and wearing boot.  IMPRESSION: Multiple strokes or TIAs.     PLAN: 1.  At this point, he will continue Plavix and discontinue ASA, as long term dual antiplatelet therapy has not been shown to be superior for secondary stroke prevention but increases risk for bleeding. 2.  Continue atorvastatin  daily, LDL goal less than 70 3.  Continue blood pressure control 4.  To further evaluate possible semi-mobile plaque in proximal ascending aorta, will check CTA of chest 5.  He is agreeable to a 24 hour Holter monitor. 6.  Follow up in 6 months.  19 minutes spent face to face with patient, over 50% spent discussing management.  Shon Millet, DO  CC:  Elfredia Nevins, MD

## 2017-12-11 NOTE — Progress Notes (Signed)
Plavix and Lipitor Rxs called in to [facility], spoke with Lorin Picket

## 2017-12-21 DIAGNOSIS — S82832A Other fracture of upper and lower end of left fibula, initial encounter for closed fracture: Secondary | ICD-10-CM | POA: Diagnosis not present

## 2017-12-22 ENCOUNTER — Telehealth: Payer: Self-pay | Admitting: Neurology

## 2017-12-22 ENCOUNTER — Ambulatory Visit
Admission: RE | Admit: 2017-12-22 | Discharge: 2017-12-22 | Disposition: A | Discharge status: Home / Self Care | Payer: Medicare Other | Source: Ambulatory Visit | Attending: Neurology | Admitting: Neurology

## 2017-12-22 ENCOUNTER — Other Ambulatory Visit: Payer: Self-pay

## 2017-12-22 DIAGNOSIS — I700 Atherosclerosis of aorta: Secondary | ICD-10-CM | POA: Diagnosis not present

## 2017-12-22 DIAGNOSIS — Q254 Congenital malformation of aorta unspecified: Secondary | ICD-10-CM

## 2017-12-22 MED ORDER — ATORVASTATIN CALCIUM 80 MG PO TABS: 80.0000 mg | ORAL_TABLET | Freq: Every day | ORAL | 3 refills | Status: DC

## 2017-12-22 MED ORDER — CLOPIDOGREL BISULFATE 75 MG PO TABS: 75.0000 mg | ORAL_TABLET | Freq: Every day | ORAL | 3 refills | Status: DC

## 2017-12-22 MED ORDER — IOPAMIDOL (ISOVUE-370) INJECTION 76%
75.0000 mL | Freq: Once | INTRAVENOUS | Status: AC | PRN
  Administered 2017-12-22: 75 mL via INTRAVENOUS

## 2017-12-22 NOTE — Telephone Encounter (Signed)
Patient son states that they have miss placed the RX for the plavix and lipitor and would need another

## 2017-12-22 NOTE — Telephone Encounter (Signed)
Called and spoke to [place], he requests 90 day supplies of both Lipitor  and Plavix . Rx's sent to [facility] in [place]

## 2017-12-27 ENCOUNTER — Telehealth: Payer: Self-pay

## 2017-12-27 NOTE — Telephone Encounter (Signed)
Called and spoke with Pt, advised him of results

## 2017-12-27 NOTE — Telephone Encounter (Signed)
-----   Message from Drema Dallas, DO sent at 12/25/2017  9:13 AM EDT ----- CTA of chest does not reveal anything concerning in regards to his blood vessels, just a little atherosclerosis.  Incidentally, a small cyst is seen in the liver, which is of no clinical significance.

## 2018-01-09 DIAGNOSIS — I639 Cerebral infarction, unspecified: Secondary | ICD-10-CM | POA: Diagnosis not present

## 2018-01-16 DIAGNOSIS — S82832D Other fracture of upper and lower end of left fibula, subsequent encounter for closed fracture with routine healing: Secondary | ICD-10-CM | POA: Diagnosis not present

## 2018-01-19 IMAGING — CT CT ANGIO HEAD
1 of 8 series · 6 of 33 positions shown · IV contrast (Isovue)
Comparison: 04/12/2017 CT head.  03/20/2017 MRI head.

CLINICAL DATA: 60 y/o  M; 60 y/o  M; right-sided numbness.

EXAM:
CT ANGIOGRAPHY HEAD AND NECK
TECHNIQUE: Multidetector CT imaging of the head and neck was performed using
the standard protocol during bolus administration of intravenous
contrast. Multiplanar CT image reconstructions and MIPs were
obtained to evaluate the vascular anatomy. Carotid stenosis
measurements (when applicable) are obtained utilizing NASCET
criteria, using the distal internal carotid diameter as the
denominator.
CONTRAST:  100 cc Isovue 370

[Series 6: ax thin · axial · 0.39mm/px · z∈[-116,+152]mm · 6 of 377 slices shown]
[im 54/377  soft-tissue]
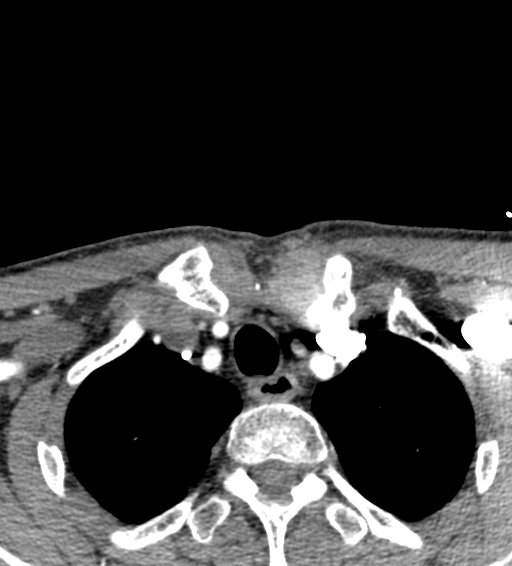
[im 108/377  bone]
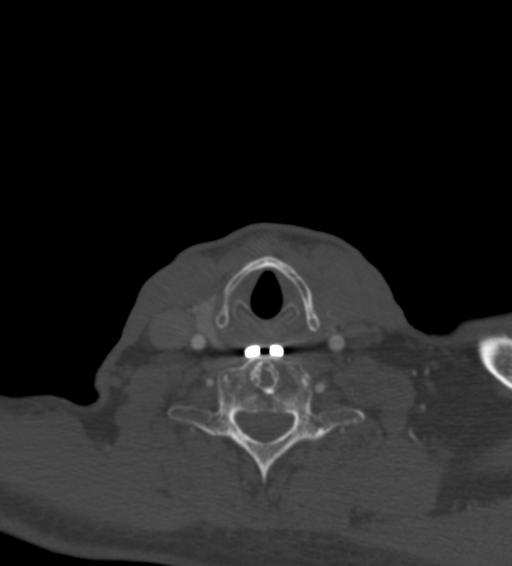
[im 162/377  soft-tissue]
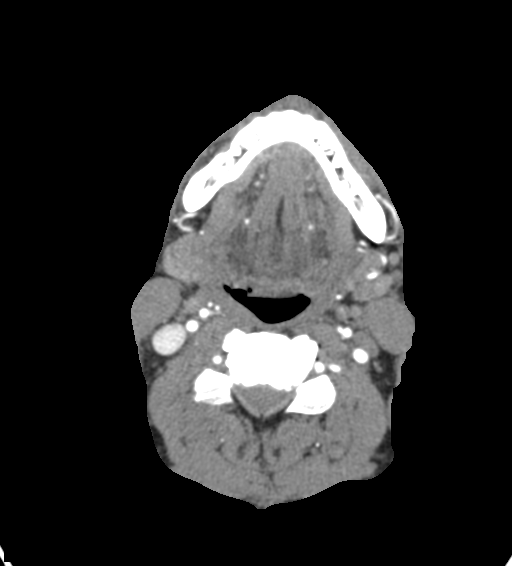
[im 215/377  bone]
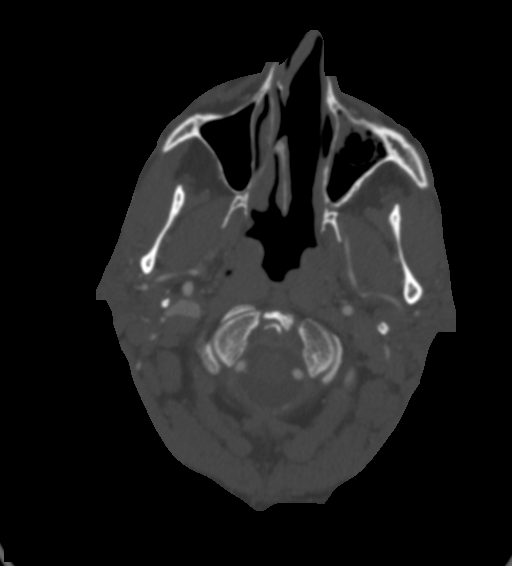
[im 269/377  soft-tissue]
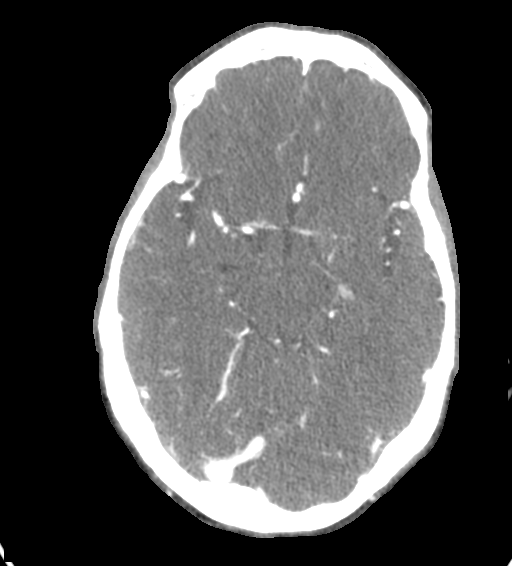
[im 323/377  bone]
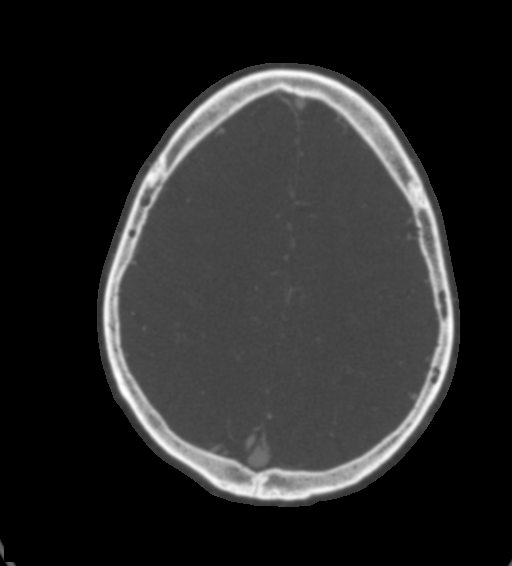

[6 of 33 positions shown; findings below may reference images not displayed]

FINDINGS: CTA NECK FINDINGS

Aortic arch: Standard branching. Imaged portion shows no evidence of
aneurysm or dissection. No significant stenosis of the major arch
vessel origins.

Right carotid system: No evidence of dissection, stenosis (50% or
greater) or occlusion.

Left carotid system: No evidence of dissection, stenosis (50% or
greater) or occlusion.

Vertebral arteries: Codominant. No evidence of dissection, stenosis
(50% or greater) or occlusion.

Skeleton: C5-C7 anterior cervical discectomy and fusion. Advanced
cervical degenerative changes at the C3-4 level with severe disc
space narrowing, a disc protrusion, and anterior marginal
osteophytes.

Other neck: 13 mm nodule within right lobe of thyroid.

Upper chest: Negative.

Review of the MIP images confirms the above findings

CTA HEAD FINDINGS

Anterior circulation: No significant stenosis, proximal occlusion,
aneurysm, or vascular malformation.

Posterior circulation: Tandem segments of severe stenosis of right
vertebral artery.

Patent left vertebral artery, basilar artery, and bilateral
posterior cerebral arteries. No large vessel occlusion, aneurysm, or
significant stenosis is identified.

Venous sinuses: As permitted by contrast timing, patent.

Anatomic variants: Anterior communicating artery and probable
diminutive bilateral posterior communicating arteries.

Delayed phase: No abnormal intracranial enhancement.

Review of the MIP images confirms the above findings
IMPRESSION: 1. Patent carotid and vertebral arteries in the neck. No dissection,
aneurysm, or hemodynamically significant stenosis utilizing NASCET
criteria.
2. Patent circle of Willis.  No large vessel occlusion or aneurysm.

3. Tandem segments of severe stenosis of right vertebral artery
proximal to PICA origin. Otherwise no significant intracranial
arterial stenosis.
These results were called by telephone at the time of interpretation
on 04/12/2017 at [DATE] to Dr. BETHZAIDA RAMESH , who verbally
acknowledged these results.

By: Nazareth Jumper M.D.

## 2018-02-08 DIAGNOSIS — I639 Cerebral infarction, unspecified: Secondary | ICD-10-CM | POA: Diagnosis not present

## 2018-02-16 DIAGNOSIS — Z0001 Encounter for general adult medical examination with abnormal findings: Secondary | ICD-10-CM | POA: Diagnosis not present

## 2018-02-16 DIAGNOSIS — Z6823 Body mass index (BMI) 23.0-23.9, adult: Secondary | ICD-10-CM | POA: Diagnosis not present

## 2018-02-16 DIAGNOSIS — I10 Essential (primary) hypertension: Secondary | ICD-10-CM | POA: Diagnosis not present

## 2018-02-21 ENCOUNTER — Ambulatory Visit: Payer: Medicare Other | Admitting: Neurology

## 2018-02-21 ENCOUNTER — Other Ambulatory Visit: Payer: Self-pay

## 2018-02-21 ENCOUNTER — Encounter: Payer: Self-pay | Admitting: Neurology

## 2018-02-21 VITALS — BP 140/80 | HR 86 | Ht 69.0 in | Wt 156.0 lb

## 2018-02-21 DIAGNOSIS — I10 Essential (primary) hypertension: Secondary | ICD-10-CM

## 2018-02-21 DIAGNOSIS — R200 Anesthesia of skin: Secondary | ICD-10-CM | POA: Diagnosis not present

## 2018-02-21 DIAGNOSIS — I639 Cerebral infarction, unspecified: Secondary | ICD-10-CM

## 2018-02-21 NOTE — Patient Instructions (Addendum)
1.  Continue Plavix 75mg  daily 2.  Continue atorvastatin 80mg  daily 3.  Continue blood pressure medications 4.  We will reorder 24 hour Holter monitor 5.  We will check a new MRI of brain due to worsening left sided numbness 6.  Follow up in 6 months or sooner if needed.  We have sent a referral to [facility] for your MRI and they will call you directly to schedule your appt. You will need to contact them directly at [phone] to schedule the time.  I talked with Aurther Lofterry at [facility] in [place]. A nurse will contact you later today or tomorrow to schedule.

## 2018-02-21 NOTE — Progress Notes (Signed)
NEUROLOGY FOLLOW UP OFFICE NOTE  Brendan Charles [id number]  HISTORY OF PRESENT ILLNESS: Brendan Charles is a 63 year old right-handed male with tobacco use, hypertension and history of prior strokes and hepatitis C who follows up for strokes and TIAs.  He is accompanied by his son who supplements history.   UPDATE: He is on Plavix.  He is taking atorvastatin 80mg  daily.  They never heard from anyone about the 24 hour Holter monitor.  For about 1 to 2 months, he reports worsening of left sided numbness.  There is no new/worsening pain or weakness.   HISTORY: He was admitted to [facility] from 03/20/17 to 03/21/17 for stroke.  He underwent colonscopy a week prior to admission and developed slurred speech.  A few days later, he developed right sided weakness and speech difficulty but did not seek immediate medical attention.  The following day, he developed left lower quadrant pain and then went to the ED.  MRI of brain revealed acute left basal ganglia ischemic infarct extending towards the corona radiata with associated high-grade stenosis or occlusion of the anterior inferior left M2 segment on MRA.  It also revealed moderate to high-grade stenosis of the proximal right vertebral artery with distal flow beyond the PICA as well as 1.5 mm aneurysm or more likely infundibulum of the left PCOM artery.  Carotid doppler revealed small plaque in left ICA but no hemodynamically significant ICA stenosis bilaterally.  TTE showed EF 60-65% with no atrial septal defect/PFO or other cardiac source of emboli.  LDL was 89, Hgb A1c 5.5, HIV antibody negative, RPR negative, TSH 2.892, and B12 318.  He was started on ASA 325mg  and Plavix 75mg  and atorvastatin 80mg .     He was readmitted to [facility] from 04/12/17 to 04/13/17 for sudden onset left upper extremity numbness and weakness.  Due to recent stroke, he was not tPA candidate.  MRI of brain revealed acute right medullary lacunar ischemic infarct.  CTA  of head and neck again revealed severe tandem right V4 vertebral artery stenosis proximal to PICA origin but with patent carotid arteries and intracranial vasculature.  He was continued on dual antiplatelet therapy with Lipitor 80mg .  He was discharged to rehab for 1 week.  He again presented to the ED on 05/18/17 after waking up in the morning with new left sided numbness and weakness.  CT of head showed chronic lacunar infarcts but nothing acute.  MRI of brain showed expected evolution of his recent left basal ganglia/corona radiata and right medullary infarcts but no acute findings.  MRA of head was again notable for unchanged severe tandem right V4 vertebral artery stenosis.  TEE from 09/19/17 demonstratee LV EF 55-60% with no PFO or atrial septal defect or evidence of intracardiac thrombus.  It did reveal questionable semi-mobile plaque in the proximal ascending aorta. Follow up CTA of chest from 12/22/17 showed no abnormality to correspond with finding on the TEE.  LDL from 08/28/17 was 68.  PAST MEDICAL HISTORY: Past Medical History:  Diagnosis Date  . Anxiety   . Chronic back pain   . CVA (cerebral vascular accident) (HCC) 03/20/2017  . Hepatitis C    HEP C, never treated, 2007  . Hypertension     MEDICATIONS: Current Outpatient Medications on File Prior to Visit  Medication Sig Dispense Refill  . ALPRAZolam (XANAX) 0.5 MG tablet Take 0.5 mg by mouth 3 (three) times daily as needed for anxiety.    Marland Kitchen amLODipine (  NORVASC) 5 MG tablet Take 5 mg by mouth daily.  11  . aspirin 325 MG tablet Take 1 tablet (325 mg total) by mouth daily. (Patient not taking: Reported on 02/21/2018) 30 tablet 12  . atorvastatin (LIPITOR) 80 MG tablet Take 1 tablet (80 mg total) by mouth at bedtime. 30 tablet 5  . atorvastatin (LIPITOR) 80 MG tablet Take 1 tablet (80 mg total) by mouth daily. 90 tablet 3  . clopidogrel (PLAVIX) 75 MG tablet Take 1 tablet (75 mg total) by mouth daily with breakfast. 30 tablet 5  .  clopidogrel (PLAVIX) 75 MG tablet Take 1 tablet (75 mg total) by mouth daily. 90 tablet 3  . losartan (COZAAR) 100 MG tablet Take 100 mg by mouth daily.  11  . metoprolol tartrate (LOPRESSOR) 25 MG tablet Take 1 tablet (25 mg total) by mouth 2 (two) times daily. 60 tablet 0  . mirtazapine (REMERON SOL-TAB) 15 MG disintegrating tablet Take 1 tablet (15 mg total) by mouth at bedtime. (Patient not taking: Reported on 02/21/2018) 30 tablet 0  . oxyCODONE-acetaminophen (PERCOCET) 7.5-325 MG tablet Take 1 tablet by mouth every 4 (four) hours as needed for severe pain. 30 tablet 0   No current facility-administered medications on file prior to visit.     ALLERGIES: Allergies  Allergen Reactions  . Codeine Itching and Nausea Only    FAMILY HISTORY: Family History  Problem Relation Age of Onset  . Cancer Mother   . Dementia Sister   . Colon cancer Neg Hx     SOCIAL HISTORY: Social History   Socioeconomic History  . Marital status: Divorced    Spouse name: Not on file  . Number of children: Not on file  . Years of education: Not on file  . Highest education level: Associate degree: academic program  Occupational History  . Occupation: retired    Comment: former truck Runner, broadcasting/film/videodriver  Social Needs  . Financial resource strain: Not on file  . Food insecurity:    Worry: Not on file    Inability: Not on file  . Transportation needs:    Medical: Not on file    Non-medical: Not on file  Tobacco Use  . Smoking status: Former Smoker    Packs/day: 0.50    Years: 45.00    Pack years: 22.50    Types: Cigarettes    Last attempt to quit: 03/2017    Years since quitting: 0.9  . Smokeless tobacco: Never Used  . Tobacco comment: pt states quit smoking 1 wk ago  Substance and Sexual Activity  . Alcohol use: No  . Drug use: No  . Sexual activity: Not Currently    Birth control/protection: None  Lifestyle  . Physical activity:    Days per week: Not on file    Minutes per session: Not on file    . Stress: Not on file  Relationships  . Social connections:    Talks on phone: Not on file    Gets together: Not on file    Attends religious service: Not on file    Active member of club or organization: Not on file    Attends meetings of clubs or organizations: Not on file    Relationship status: Not on file  . Intimate partner violence:    Fear of current or ex partner: Not on file    Emotionally abused: Not on file    Physically abused: Not on file    Forced sexual activity: Not on file  Other Topics Concern  . Not on file  Social History Narrative   Divorced, lives with son in a 2 story home, though Pt stays in 1st level. He drinks one cup of coffee and one soda a day. Is to begin O/P P/T next week.    REVIEW OF SYSTEMS: Constitutional: No fevers, chills, or sweats, no generalized fatigue, change in appetite Eyes: No visual changes, double vision, eye pain Ear, nose and throat: No hearing loss, ear pain, nasal congestion, sore throat Cardiovascular: No chest pain, palpitations Respiratory:  No shortness of breath at rest or with exertion, wheezes GastrointestinaI: No nausea, vomiting, diarrhea, abdominal pain, fecal incontinence Genitourinary:  No dysuria, urinary retention or frequency Musculoskeletal:  No neck pain, back pain Integumentary: No rash, pruritus, skin lesions Neurological: as above Psychiatric: No depression, insomnia, anxiety Endocrine: No palpitations, fatigue, diaphoresis, mood swings, change in appetite, change in weight, increased thirst Hematologic/Lymphatic:  No purpura, petechiae. Allergic/Immunologic: no itchy/runny eyes, nasal congestion, recent allergic reactions, rashes  PHYSICAL EXAM: Vitals:   02/21/18 1010  BP: 140/80  Pulse: 86  SpO2: 97%   General: No acute distress.   Head:  Normocephalic/atraumatic Eyes:  Fundi examined but not visualized Neck: supple, no paraspinal tenderness, full range of motion Heart:  Regular rate and  rhythm Lungs:  Clear to auscultation bilaterally Back: No paraspinal tenderness Neurological Exam: alert and oriented to person, place, and time. Attention span and concentration intact, recent and remote memory intact, fund of knowledge intact.  Speech fluent but mildly dysarthric language intact.  Mild left sided lower facial weakness.  Otherwise, CN II-XII intact. Bulk and tone normal, muscle strength 4+/5 left triceps/biceps, 5-/5 left shoulder abduction and grip, otherwise, 5/5.  Sensation to pinprick reduced in left upper and lower extremities  Deep tendon reflexes 3+ left upper and lower extremities, 2+ right upper and lower extremities, toes downgoing.  Finger to nose testing intact.  In wheelchair  IMPRESSION: Multiple strokes or TIAs.  PLAN: 1.  Continue Plavix 75mg  daily for secondary stroke prevention 2.  Continue atorvastatin 80mg  daily (LDL goal less than 70) 3.  Continue optimized blood pressure management 4.  We will reorder 24 hour Holter monitor 5.  We will check a new MRI of brain due to worsening left sided numbness 6.  Follow up in 6 months or sooner if needed.  21 minutes spent face to face with patient, over 50% spent discussing management.  Shon Millet, DO  CC:  Elfredia Nevins, MD

## 2018-02-21 NOTE — Addendum Note (Signed)
Addended by: Dorthy CoolerBURNS, Domique Clapper J on: 02/21/2018 02:07 PM   Modules accepted: Orders

## 2018-03-02 ENCOUNTER — Ambulatory Visit: Payer: Medicare Other | Admitting: Neurology

## 2018-03-02 ENCOUNTER — Ambulatory Visit (HOSPITAL_COMMUNITY)
Admission: RE | Admit: 2018-03-02 | Discharge: 2018-03-02 | Disposition: A | Discharge status: Home / Self Care | Payer: Medicare Other | Source: Ambulatory Visit | Attending: Neurology | Admitting: Neurology

## 2018-03-02 DIAGNOSIS — R200 Anesthesia of skin: Secondary | ICD-10-CM | POA: Diagnosis not present

## 2018-03-05 ENCOUNTER — Telehealth: Payer: Self-pay

## 2018-03-05 NOTE — Telephone Encounter (Signed)
Called and spoke with Pt,  Advised him of MRI results.

## 2018-03-05 NOTE — Telephone Encounter (Signed)
-----   Message from Drema DallasAdam R Jaffe, DO sent at 03/02/2018  1:53 PM EDT ----- MRI shows no new stroke.

## 2018-03-11 DIAGNOSIS — I639 Cerebral infarction, unspecified: Secondary | ICD-10-CM | POA: Diagnosis not present

## 2018-06-13 DIAGNOSIS — I10 Essential (primary) hypertension: Secondary | ICD-10-CM | POA: Diagnosis not present

## 2018-06-13 DIAGNOSIS — Z1389 Encounter for screening for other disorder: Secondary | ICD-10-CM | POA: Diagnosis not present

## 2018-06-13 DIAGNOSIS — G894 Chronic pain syndrome: Secondary | ICD-10-CM | POA: Diagnosis not present

## 2018-06-13 DIAGNOSIS — Z6822 Body mass index (BMI) 22.0-22.9, adult: Secondary | ICD-10-CM | POA: Diagnosis not present

## 2018-06-15 ENCOUNTER — Ambulatory Visit: Payer: Medicare Other | Admitting: Neurology

## 2018-08-27 NOTE — Progress Notes (Signed)
NEUROLOGY FOLLOW UP OFFICE NOTE  [name] [id number]  HISTORY OF PRESENT ILLNESS: Brendan Charles is a 63 year old right-handed male with tobacco use, hypertension and history of prior strokes and hepatitis C who follows up for strokes and TIAs.  He is accompanied by his son who supplements history.  UPDATE: Current medications include Plavix, losartan, metoprolol and atorvastatin 80 mg daily.  In July, he endorsed 1 to 2 months of worsening left-sided numbness.  MRI of the brain from 03/02/2018 was personally reviewed and again demonstrated advanced chronic small vessel ischemic changes but no acute infarct.  24-hour Holter monitor was again ordered but not performed.  Still reports ongoing numbness.  He also reports brief vertigo when he turns in bed.  This is not new.  He would also like to have PT again for gait.  He was unable to participate last time due to broken foot.  HISTORY: He was admitted to [facility] from 03/20/17 to 03/21/17 for stroke.  He underwent colonscopy a week prior to admission and developed slurred speech.  A few days later, he developed right sided weakness and speech difficulty but did not seek immediate medical attention.  The following day, he developed left lower quadrant pain and then went to the ED.  MRI of brain revealed acute left basal ganglia ischemic infarct extending towards the corona radiata with associated high-grade stenosis or occlusion of the anterior inferior left M2 segment on MRA.  It also revealed moderate to high-grade stenosis of the proximal right vertebral artery with distal flow beyond the PICA as well as 1.5 mm aneurysm or more likely infundibulum of the left PCOM artery.  Carotid doppler revealed small plaque in left ICA but no hemodynamically significant ICA stenosis bilaterally.  TTE showed EF 60-65% with no atrial septal defect/PFO or other cardiac source of emboli.  LDL was 89, Hgb A1c 5.5, HIV antibody negative, RPR negative,  TSH 2.892, and B12 318.  He was started on ASA 325mg  and Plavix 75mg  and atorvastatin 80mg .    He was readmitted to [facility] from 04/12/17 to 04/13/17 for sudden onset left upper extremity numbness and weakness.  Due to recent stroke, he was not tPA candidate.  MRI of brain revealed acute right medullary lacunar ischemic infarct.  CTA of head and neck again revealed severe tandem right V4 vertebral artery stenosis proximal to PICA origin but with patent carotid arteries and intracranial vasculature.  He was continued on dual antiplatelet therapy with Lipitor 80mg .  He was discharged to rehab for 1 week.  He again presented to the ED on 05/18/17 after waking up in the morning with new left sided numbness and weakness.  CT of head showed chronic lacunar infarcts but nothing acute.  MRI of brain showed expected evolution of his recent left basal ganglia/corona radiata and right medullary infarcts but no acute findings.  MRA of head was again notable for unchanged severe tandem right V4 vertebral artery stenosis.  TEE from 09/19/17 demonstratee LV EF 55-60% with no PFO or atrial septal defect or evidence of intracardiac thrombus.  It did reveal questionable semi-mobile plaque in the proximal ascending aorta. Follow up CTA of chest from 12/22/17 showed no abnormality to correspond with finding on the TEE.  LDL from 08/28/17 was 68.  PAST MEDICAL HISTORY: Past Medical History:  Diagnosis Date  . Anxiety   . Chronic back pain   . CVA (cerebral vascular accident) (HCC) 03/20/2017  . Hepatitis C    HEP  C, never treated, 2007  . Hypertension     MEDICATIONS: Current Outpatient Medications on File Prior to Visit  Medication Sig Dispense Refill  . ALPRAZolam (XANAX) 0.5 MG tablet Take 0.5 mg by mouth 3 (three) times daily as needed for anxiety.    Marland Kitchen. amLODipine (NORVASC) 5 MG tablet Take 5 mg by mouth daily.  11  . aspirin 325 MG tablet Take 1 tablet (325 mg total) by mouth daily. (Patient not taking: Reported  on 02/21/2018) 30 tablet 12  . atorvastatin (LIPITOR) 80 MG tablet Take 1 tablet (80 mg total) by mouth at bedtime. 30 tablet 5  . atorvastatin (LIPITOR) 80 MG tablet Take 1 tablet (80 mg total) by mouth daily. 90 tablet 3  . clopidogrel (PLAVIX) 75 MG tablet Take 1 tablet (75 mg total) by mouth daily with breakfast. 30 tablet 5  . clopidogrel (PLAVIX) 75 MG tablet Take 1 tablet (75 mg total) by mouth daily. 90 tablet 3  . escitalopram (LEXAPRO) 10 MG tablet Take 10 mg by mouth daily.  11  . losartan (COZAAR) 100 MG tablet Take 100 mg by mouth daily.  11  . metoprolol tartrate (LOPRESSOR) 25 MG tablet Take 1 tablet (25 mg total) by mouth 2 (two) times daily. 60 tablet 0  . mirtazapine (REMERON SOL-TAB) 15 MG disintegrating tablet Take 1 tablet (15 mg total) by mouth at bedtime. (Patient not taking: Reported on 02/21/2018) 30 tablet 0  . oxyCODONE-acetaminophen (PERCOCET) 7.5-325 MG tablet Take 1 tablet by mouth every 4 (four) hours as needed for severe pain. 30 tablet 0   No current facility-administered medications on file prior to visit.     ALLERGIES: Allergies  Allergen Reactions  . Codeine Itching and Nausea Only    FAMILY HISTORY: Family History  Problem Relation Age of Onset  . Cancer Mother   . Dementia Sister   . Colon cancer Neg Hx    SOCIAL HISTORY: Social History   Socioeconomic History  . Marital status: Divorced    Spouse name: Not on file  . Number of children: Not on file  . Years of education: Not on file  . Highest education level: Associate degree: academic program  Occupational History  . Occupation: retired    Comment: former truck Runner, broadcasting/film/videodriver  Social Needs  . Financial resource strain: Not on file  . Food insecurity:    Worry: Not on file    Inability: Not on file  . Transportation needs:    Medical: Not on file    Non-medical: Not on file  Tobacco Use  . Smoking status: Former Smoker    Packs/day: 0.50    Years: 45.00    Pack years: 22.50    Types:  Cigarettes    Last attempt to quit: 03/2017    Years since quitting: 1.4  . Smokeless tobacco: Never Used  . Tobacco comment: pt states quit smoking 1 wk ago  Substance and Sexual Activity  . Alcohol use: No  . Drug use: No  . Sexual activity: Not Currently    Birth control/protection: None  Lifestyle  . Physical activity:    Days per week: Not on file    Minutes per session: Not on file  . Stress: Not on file  Relationships  . Social connections:    Talks on phone: Not on file    Gets together: Not on file    Attends religious service: Not on file    Active member of club or organization: Not on  file    Attends meetings of clubs or organizations: Not on file    Relationship status: Not on file  . Intimate partner violence:    Fear of current or ex partner: Not on file    Emotionally abused: Not on file    Physically abused: Not on file    Forced sexual activity: Not on file  Other Topics Concern  . Not on file  Social History Narrative   Divorced, lives with son in a 2 story home, though Pt stays in 1st level. He drinks one cup of coffee and one soda a day. Is to begin O/P P/T next week.    REVIEW OF SYSTEMS: Constitutional: No fevers, chills, or sweats, no generalized fatigue, change in appetite Eyes: No visual changes, double vision, eye pain Ear, nose and throat: No hearing loss, ear pain, nasal congestion, sore throat Cardiovascular: No chest pain, palpitations Respiratory:  No shortness of breath at rest or with exertion, wheezes GastrointestinaI: No nausea, vomiting, diarrhea, abdominal pain, fecal incontinence Genitourinary:  No dysuria, urinary retention or frequency Musculoskeletal:  No neck pain, back pain Integumentary: No rash, pruritus, skin lesions Neurological: as above Psychiatric: No depression, insomnia, anxiety Endocrine: No palpitations, fatigue, diaphoresis, mood swings, change in appetite, change in weight, increased thirst Hematologic/Lymphatic:   No purpura, petechiae. Allergic/Immunologic: no itchy/runny eyes, nasal congestion, recent allergic reactions, rashes  PHYSICAL EXAM: Blood pressure 114/76, pulse 79, height 5\' 9"  (1.753 m), weight 153 lb (69.4 kg), SpO2 98 %. General: No acute distress.  Patient appears poorly groomed Head:  Normocephalic/atraumatic Eyes:  Fundi examined but not visualized Neck: supple, no paraspinal tenderness, full range of motion Heart:  Regular rate and rhythm Lungs:  Clear to auscultation bilaterally Back: No paraspinal tenderness Neurological Exam: alert and oriented to person, place, and time. Attention span and concentration intact, recent and remote memory intact, fund of knowledge intact.  Speech fluent and not dysarthric, language intact.  Mild left-sided lower facial weakness.  Otherwise, CN II-XII intact. Bulk and tone normal, muscle strength 4+/5 left triceps/biceps, 5-/5 left shoulder abduction, 5-/5 left hip flexion, giveway weakness right hip flexion, otherwise 5/5 throughout.  Sensation to pinprick and vibration sensation intact in the left upper and lower extremities.  Deep tendon reflexes 3+ left upper and lower extremities, 2+ right upper and lower extremities.  Finger to nose testing intact.  In wheelchair.  IMPRESSION: Multiple strokes or TIAs in setting of hypertension, hyperlipidemia and tobacco use (quit in 2018) Benign paroxysmal positional vertigo Left sided numbness residual from prior stroke.    PLAN: 1.  Continue Plavix 75mg  daily for secondary stroke prevention as per PCP 2.  Continue atorvastatin 80mg  daily as per PCP (LDL goal less than 70) 3.  Continue metoprolol and losartan for blood pressure control as per PCP 4.  Will refer to PT for gait instability and vestibular rehab 5.  Follow up in 6 months.  25 minutes spent face to face with patient, over 50% spent discussing management.  Shon Millet, DO  CC: Elfredia Nevins, MD

## 2018-08-28 ENCOUNTER — Ambulatory Visit: Payer: Medicare Other | Admitting: Neurology

## 2018-08-28 ENCOUNTER — Encounter: Payer: Self-pay | Admitting: Neurology

## 2018-08-28 VITALS — BP 114/76 | HR 79 | Ht 69.0 in | Wt 153.0 lb

## 2018-08-28 DIAGNOSIS — I63311 Cerebral infarction due to thrombosis of right middle cerebral artery: Secondary | ICD-10-CM

## 2018-08-28 DIAGNOSIS — H8110 Benign paroxysmal vertigo, unspecified ear: Secondary | ICD-10-CM

## 2018-08-28 DIAGNOSIS — I10 Essential (primary) hypertension: Secondary | ICD-10-CM | POA: Diagnosis not present

## 2018-08-28 NOTE — Patient Instructions (Addendum)
1.  Continue Plavix 75mg  daily  2.  Continue atorvastatin 80mg  daily 3.  Continue blood pressure control 4.  Will refer to physical therapy for gait and vestibular rehab 5.  Follow up in 6 months.

## 2018-09-07 ENCOUNTER — Encounter (HOSPITAL_COMMUNITY): Payer: Self-pay

## 2018-09-07 ENCOUNTER — Ambulatory Visit (HOSPITAL_COMMUNITY): Payer: Medicare Other | Attending: Neurology

## 2018-09-07 ENCOUNTER — Other Ambulatory Visit: Payer: Self-pay

## 2018-09-07 DIAGNOSIS — H8111 Benign paroxysmal vertigo, right ear: Secondary | ICD-10-CM | POA: Diagnosis not present

## 2018-09-07 DIAGNOSIS — R262 Difficulty in walking, not elsewhere classified: Secondary | ICD-10-CM | POA: Insufficient documentation

## 2018-09-07 DIAGNOSIS — M6281 Muscle weakness (generalized): Secondary | ICD-10-CM | POA: Insufficient documentation

## 2018-09-07 DIAGNOSIS — R42 Dizziness and giddiness: Secondary | ICD-10-CM | POA: Diagnosis not present

## 2018-09-07 DIAGNOSIS — I69354 Hemiplegia and hemiparesis following cerebral infarction affecting left non-dominant side: Secondary | ICD-10-CM | POA: Diagnosis not present

## 2018-09-07 NOTE — Therapy (Signed)
[facility] [address] [place], [place], [address] Phone: [phone]   Fax:  [phone]  Physical Therapy Evaluation  Patient Details  Name: Brendan Charles MRN: [id number] Date of Birth: [birth year] Referring Provider (PT): Shon Millet, MD   Encounter Date: 09/07/2018  PT End of Session - 09/07/18 1527    Visit Number  1    Number of Visits  9    Date for PT Re-Evaluation  11/02/18   mini reassess 10/05/18   Authorization Type  [employer] Medicare    Authorization Time Period  09/07/18 to 11/02/18    Authorization - Visit Number  1    Authorization - Number of Visits  10    PT Start Time  1021    PT Stop Time  1109    PT Time Calculation (min)  48 min    Activity Tolerance  Patient tolerated treatment well    Behavior During Therapy  [facility] for tasks assessed/performed       Past Medical History:  Diagnosis Date  . Anxiety   . Chronic back pain   . CVA (cerebral vascular accident) (HCC) 03/20/2017  . Hepatitis C    HEP C, never treated, 2007  . Hypertension     Past Surgical History:  Procedure Laterality Date  . CERVICAL DISC SURGERY  2001   anterior  . COLONOSCOPY WITH PROPOFOL N/A 03/14/2017   Procedure: COLONOSCOPY WITH PROPOFOL;  Surgeon: West Bali, MD;  Location: [facility];  Service: Endoscopy;  Laterality: N/A;  8:15am  . CYSTOSCOPY W/ URETERAL STENT PLACEMENT Left 03/23/2017   Procedure: CYSTOSCOPY WITH LEFT RETROGRADE PYELOGRAM/URETERAL LEFT STENT PLACEMENT;  Surgeon: Malen Gauze, MD;  Location: [facility];  Service: Urology;  Laterality: Left;  . CYSTOSCOPY/RETROGRADE/URETEROSCOPY Left 07/17/2017   Procedure: CYSTOSCOPY/RETROGRADE/URETEROSCOPY, stent removal, stone extraction, stent replacement;  Surgeon: Malen Gauze, MD;  Location: [facility];  Service: Urology;  Laterality: Left;  . HOLMIUM LASER APPLICATION Left 07/17/2017   Procedure: HOLMIUM LASER APPLICATION;  Surgeon: Malen Gauze, MD;   Location: [facility];  Service: Urology;  Laterality: Left;  . LUMBAR SPINE SURGERY  2015  . POLYPECTOMY  03/14/2017   Procedure: POLYPECTOMY;  Surgeon: West Bali, MD;  Location: [facility];  Service: Endoscopy;;  rectum  . TEE WITHOUT CARDIOVERSION N/A 09/19/2017   Procedure: TRANSESOPHAGEAL ECHOCARDIOGRAM (TEE);  Surgeon: Antoine Poche, MD;  Location: [facility];  Service: Endoscopy;  Laterality: N/A;    There were no vitals filed for this visit.   Subjective Assessment - 09/07/18 1027    Subjective  Pt reports that he has had 2 strokes back to back in mid-late 2018. He states that he has L sided residual hemiplegia and L sided numbness. He fell and broke his L foot April 2019. He was put in a CAM boot and let it heal conservatively. He states that since he broke his foot, his L foot has been numb ever since and all of his L sided numbness has worsened. He does report some vertigo/dizziness mainly when he turns over to the Right. This has bene going on "for forever" but he has not had it treated before. He states that it has worsened since he broke his foot. He denies n/v during the spells of dizzniess and states that the dizzy spells last for a few seconds. He reports 3 falls in the last 2 months due to essentially losing his footing and fell. He reports  having the most difficulty with baland, standing, walking due to his stroke. He had HHPT following his hospital discharges for his strokes and other medical issues and only a few sessions of OPPT but had to stop due to breaking his foot. He lives with one of his sons but he is able to dress and bathe but needs assistance to get out of the tub; his son does all of the IADLs (cooking, cleaning, etc.).    Patient Stated Goals  get right    Currently in Pain?  Yes    Pain Score  8     Pain Location  Foot    Pain Orientation  Left    Pain Descriptors / Indicators  Dull    Pain Type  Chronic pain    Pain Onset  More than a month ago     Pain Frequency  Constant    Aggravating Factors   walking    Pain Relieving Factors  sitting/rest    Effect of Pain on Daily Activities  increases         [facility] PT Assessment - 09/07/18 0001      Assessment   Medical Diagnosis  Cerebral infarction due to thrombosis of right middle cerebral artery; BPPV    Referring Provider (PT)  Shon Millet, MD    Onset Date/Surgical Date  --   CVAs in late 2018, fractured L foot in April 2019   Next MD Visit  in 6 months    Prior Therapy  HHPT and little bit of OPPT      Balance Screen   Has the patient fallen in the past 6 months  Yes    How many times?  3    Has the patient had a decrease in activity level because of a fear of falling?   Yes    Is the patient reluctant to leave their home because of a fear of falling?   Yes      Prior Function   Level of Independence  Independent with basic ADLs;Independent with household mobility with device    Leisure  watch TV      Observation/Other Assessments   Observations  will assess CVA impairments at a later date as pt wished to address vertigo initially    Focus on Therapeutic Outcomes (FOTO)   n/a - not acute stroke      ROM / Strength   AROM / PROM / Strength  Strength      Strength   Overall Strength Comments  will assess strength at later date due to pt wishing to address vertigo initially    Strength Assessment Site  Hip;Knee;Ankle           Vestibular Assessment - 09/07/18 0001      Symptom Behavior   Type of Dizziness  --   "dizziness"   Frequency of Dizziness  daily    Duration of Dizziness  "seconds"    Aggravating Factors  Turning head quickly;Rolling to right    Relieving Factors  Head stationary;Closing eyes      Occulomotor Exam   Head shaking Horizontal  R beating nystagmus   mild over shoot, slight nystagmus L with head shake to the L   Smooth Pursuits  Intact    Saccades  Intact      Positional Testing   Dix-Hallpike  Dix-Hallpike Right;Dix-Hallpike Left     Horizontal Canal Testing  Horizontal Canal Right      Dix-Hallpike Right   Dix-Hallpike Right  Duration  negative      Dix-Hallpike Left   Dix-Hallpike Left Duration  negative      Horizontal Canal Right   Horizontal Canal Right Duration  <30 seconds    Horizontal Canal Right Symptoms  Nystagmus          Objective measurements completed on examination: See above findings.       Vestibular Treatment/Exercise - 09/07/18 0001      Vestibular Treatment/Exercise   Vestibular Treatment Provided  Canalith Repositioning    Canalith Repositioning  Canal Roll Left      Canal Roll Left   Number of Reps   1    Overall Response   No change    Response Details   BBQ roll to the LEFT to treat +symptoms with head turn to the Right; pt reporting no symptoms thorughout test until rolling onto R side from stomach (had mild nystagmus) and then with sitting up to the L from lying on R side (very mild to no nystagmus with symptoms) -- all symptoms resolved after <1 min             PT Education - 09/07/18 1527    Education Details  exam findings, POC, will address CVA impairments in future sessions once vertigo handled    Person(s) Educated  Patient;Child(ren)    Methods  Explanation    Comprehension  Verbalized understanding       PT Short Term Goals - 09/07/18 1540      PT SHORT TERM GOAL #1   Title  Pt will be independent with HEP and perform consistently in order to improve strength and reduce dizziness.    Time  4    Period  Weeks    Status  New    Target Date  10/05/18      PT SHORT TERM GOAL #2   Title  Pt will report resolution of dizziness symptoms with rolling to the R in bed.     Time  4    Period  Weeks    Status  New      PT SHORT TERM GOAL #3   Title  Pt will have improved L DF MMT to 3/5 or better to maximize balance, gait, and reduce risk for falls.     Time  4    Period  Weeks    Status  New      PT SHORT TERM GOAL #4   Title  Pt will be able to perform  8 STS during 30sec chair rise test to demo improved balance and functional strength.     Time  4    Period  Weeks    Status  New        PT Long Term Goals - 09/07/18 1545      PT LONG TERM GOAL #1   Title  Pt will have improved strength by 1/2 grade throughout BLE MMT in order to improve balance and gait.     Time  8    Period  Weeks    Status  New    Target Date  11/02/18      PT LONG TERM GOAL #2   Title  Pt will score 40/56 on the BERG balance test or > to demo improved balance and reduce his risk for falls.     Time  8    Period  Weeks    Status  New      PT LONG TERM GOAL #3  Title  Pt will have 155ft improvement during with LRAD to demo improved tolerance to functional mobility and HH ambulation.    Time  8    Period  Weeks    Status  New      PT LONG TERM GOAL #4   Title  Pt will report active participation daily walking routine at least 1x/day for 15 mins or > in order to promote overall well-being and maximize his tolerance to functional mobility.     Time  8    Period  Weeks    Status  New             Plan - 09/07/18 1528    Clinical Impression Statement  Pt is 62YO M who presents to OPPT with c/o dizziness/vertigo and L hemiplegia s/p CVAs in 2018. Pt wishing to focus on vertigo initially so did not assess CVA impairments this date and will do in future sessions once vertigo is handled. Pt reporting dizziness when he rolls to the right when in bed and sometimes when he stands up from sitting. Smooth pursuits and saccades all WNL but head impulse test to the L caused overshooting, mild nystagmus, and mild symptoms. Pt negative for Dix-Hallpike to the R and to the L but reported recreation of symptoms during supine roll test to the R and had torsional nystagmus during. Went directly into BBQ roll for canalith repositioning; pt with only mild symptoms during turn onto R side from prone and with sitting up from lying on R side. Educated pt that he could  potentially feel under the weather for the next 24hours as a side effect of the testing and treatment and he verbalized understanding. Educated pt to sit up and watch TV versus laying on L side the entire time and he verbalized understanding. Also educated pt that while he was positive for positional vertigo in horizontal canal, his symptoms when standing up from sitting are likely BP related, and that his dizziness could also be a side effect of his CVA. Pt needs skilled PT intervention to address impairments in order to reduce dizziness, reduce risk for falls, and maximize QOL. Due to high copay, pt wishing to come only 1x/week.    Clinical Presentation  Evolving    Clinical Decision Making  Moderate    Rehab Potential  Fair    PT Frequency  1x / week    PT Duration  8 weeks    PT Treatment/Interventions  ADLs/Self Care Home Management;Aquatic Therapy;Cryotherapy;Electrical Stimulation;Moist Heat;DME Instruction;Gait training;Stair training;Functional mobility training;Therapeutic activities;Therapeutic exercise;Balance training;Canalith Repostioning;Neuromuscular re-education;Orthotic Fit/Training;Patient/family education;Wheelchair mobility training;Manual techniques;Passive range of motion;Dry needling;Energy conservation;Splinting;Taping;Vestibular    PT Next Visit Plan  review goals; recheck supine roll test and treat accordingly; assess CVA impairments with objective tests and measures    Recommended Other Services  potentially OT referral for LUE deficits    Consulted and Agree with Plan of Care  Patient;Family member/caregiver    Family Member Consulted  son       Patient will benefit from skilled therapeutic intervention in order to improve the following deficits and impairments:  Abnormal gait, Decreased activity tolerance, Decreased balance, Decreased coordination, Decreased endurance, Decreased mobility, Decreased range of motion, Decreased strength, Difficulty walking, Dizziness,  Hypomobility, Increased fascial restricitons, Impaired perceived functional ability, Impaired flexibility, Impaired sensation, Impaired tone, Impaired UE functional use, Improper body mechanics, Postural dysfunction, Pain  Visit Diagnosis: BPPV (benign paroxysmal positional vertigo), right - Plan: PT plan of care cert/re-cert  Dizziness and  giddiness - Plan: PT plan of care cert/re-cert  Hemiplegia and hemiparesis following cerebral infarction affecting left non-dominant side (HCC) - Plan: PT plan of care cert/re-cert  Difficulty in walking, not elsewhere classified - Plan: PT plan of care cert/re-cert  Muscle weakness (generalized) - Plan: PT plan of care cert/re-cert     Problem List Patient Active Problem List   Diagnosis Date Noted  . Spastic hemiparesis of left dominant side as late effect of cerebral infarction (HCC) 04/19/2017  . Gait disturbance, post-stroke 04/18/2017  . Adjustment disorder with depressed mood   . Cerebral infarction due to thrombosis of right middle cerebral artery (HCC) 04/13/2017  . Weakness   . Acute ischemic stroke (HCC) 04/12/2017  . Stroke (cerebrum) (HCC) 04/12/2017  . Acute CVA (cerebrovascular accident) (HCC) 04/12/2017  . CVA (cerebral vascular accident) (HCC) 03/20/2017  . Hydronephrosis, left 03/20/2017  . Aortic atherosclerosis (HCC) 03/20/2017  . Hepatitis C 02/14/2017  . Encounter for screening colonoscopy 02/14/2017  . Spondylolisthesis at L3-L4 level 06/03/2014         Jac CanavanBrooke Sabria Florido PT, DPT   [facility] [address] [place], [place], [address] Phone: [phone]   Fax:  [phone]  Name: Brendan Charles MRN: [id number] Date of Birth: [birth year]

## 2018-09-14 ENCOUNTER — Ambulatory Visit (HOSPITAL_COMMUNITY): Payer: Medicare Other | Attending: Neurology

## 2018-09-14 ENCOUNTER — Encounter (HOSPITAL_COMMUNITY): Payer: Self-pay

## 2018-09-14 DIAGNOSIS — R262 Difficulty in walking, not elsewhere classified: Secondary | ICD-10-CM | POA: Diagnosis not present

## 2018-09-14 DIAGNOSIS — H8111 Benign paroxysmal vertigo, right ear: Secondary | ICD-10-CM | POA: Diagnosis not present

## 2018-09-14 DIAGNOSIS — R42 Dizziness and giddiness: Secondary | ICD-10-CM | POA: Diagnosis not present

## 2018-09-14 DIAGNOSIS — M6281 Muscle weakness (generalized): Secondary | ICD-10-CM | POA: Insufficient documentation

## 2018-09-14 DIAGNOSIS — I69354 Hemiplegia and hemiparesis following cerebral infarction affecting left non-dominant side: Secondary | ICD-10-CM | POA: Diagnosis not present

## 2018-09-14 NOTE — Therapy (Signed)
[facility] [address] [place], [place], [address] Phone: [phone]   Fax:  [phone]  Physical Therapy Treatment  Patient Details  Name: Brendan Charles MRN: [id number] Date of Birth: 07-21-1956 Referring Provider (PT): Shon Millet, MD   Encounter Date: 09/14/2018  PT End of Session - 09/14/18 1041    Visit Number  2    Number of Visits  9    Date for PT Re-Evaluation  11/02/18   Minireassess 10/05/18   Authorization Type  [employer] Medicare    Authorization Time Period  09/07/18 to 11/02/18    Authorization - Visit Number  2    Authorization - Number of Visits  10    PT Start Time  1035    PT Stop Time  1123    PT Time Calculation (min)  48 min    Equipment Utilized During Treatment  Gait belt    Activity Tolerance  Patient tolerated treatment well    Behavior During Therapy  [facility] for tasks assessed/performed       Past Medical History:  Diagnosis Date  . Anxiety   . Chronic back pain   . CVA (cerebral vascular accident) (HCC) 03/20/2017  . Hepatitis C    HEP C, never treated, 2007  . Hypertension     Past Surgical History:  Procedure Laterality Date  . CERVICAL DISC SURGERY  2001   anterior  . COLONOSCOPY WITH PROPOFOL N/A 03/14/2017   Procedure: COLONOSCOPY WITH PROPOFOL;  Surgeon: West Bali, MD;  [facility];  Service: Endoscopy;  Laterality: N/A;  8:15am  . CYSTOSCOPY W/ URETERAL STENT PLACEMENT Left 03/23/2017   Procedure: CYSTOSCOPY WITH LEFT RETROGRADE PYELOGRAM/URETERAL LEFT STENT PLACEMENT;  Surgeon: Malen Gauze, MD;  [facility]: [facility];  Service: Urology;  Laterality: Left;  . CYSTOSCOPY/RETROGRADE/URETEROSCOPY Left 07/17/2017   Procedure: CYSTOSCOPY/RETROGRADE/URETEROSCOPY, stent removal, stone extraction, stent replacement;  Surgeon: Malen Gauze, MD;  Location: [facility];  Service: Urology;  Laterality: Left;  . HOLMIUM LASER APPLICATION Left 07/17/2017   Procedure: HOLMIUM LASER  APPLICATION;  Surgeon: Malen Gauze, MD;  [facility];  Service: Urology;  Laterality: Left;  . LUMBAR SPINE SURGERY  2015  . POLYPECTOMY  03/14/2017   Procedure: POLYPECTOMY;  Surgeon: West Bali, MD;  [facility];  Service: Endoscopy;;  rectum  . TEE WITHOUT CARDIOVERSION N/A 09/19/2017   Procedure: TRANSESOPHAGEAL ECHOCARDIOGRAM (TEE);  Surgeon: Antoine Poche, MD;  [facility];  Service: Endoscopy;  Laterality: N/A;    There were no vitals filed for this visit.  Subjective Assessment - 09/14/18 1039    Subjective  Pt stated he has some pain with gait on Lt foot, son reports toe are beginning to curl.    Patient Stated Goals  get right    Currently in Pain?  Yes    Pain Score  8     Pain Location  Foot    Pain Orientation  Left    Pain Descriptors / Indicators  Dull;Aching    Pain Type  Chronic pain    Pain Onset  More than a month ago    Pain Frequency  Constant    Aggravating Factors   walking    Pain Relieving Factors  sitting/rest    Effect of Pain on Daily Activities  increases         [facility] PT Assessment - 09/14/18 0001      Assessment   Medical  Diagnosis  Cerebral infarction due to thrombosis of right middle cerebral artery; BPPV    Referring Provider (PT)  Shon MilletAdam Jaffe, MD    Onset Date/Surgical Date  --   CVA in late 2018, fractured Lt foot in April 2019   Hand Dominance  Right    Next MD Visit  in 6 months    Prior Therapy  HHPT and little bit of OPPT      Standardized Balance Assessment   Standardized Balance Assessment  Berg Balance Test      Berg Balance Test   Sit to Stand  Able to stand using hands after several tries    Standing Unsupported  Able to stand 2 minutes with supervision    Sitting with Back Unsupported but Feet Supported on Floor or Stool  Able to sit safely and securely 2 minutes    Stand to Sit  Sits independently, has uncontrolled descent    Transfers  Able to transfer with verbal cueing and /or  supervision    Standing Unsupported with Eyes Closed  Able to stand 3 seconds    Standing Ubsupported with Feet Together  Able to place feet together independently but unable to hold for 30 seconds   c/o dizziness   From Standing, Reach Forward with Outstretched Arm  Can reach forward >5 cm safely (2")    From Standing Position, Pick up Object from Floor  Unable to try/needs assist to keep balance    From Standing Position, Turn to Look Behind Over each Shoulder  Needs supervision when turning    Turn 360 Degrees  Needs close supervision or verbal cueing   c/o dizziness turning to the Rt   Standing Unsupported, Alternately Place Feet on Step/Stool  Needs assistance to keep from falling or unable to try    Standing Unsupported, One Foot in Front  Loses balance while stepping or standing    Standing on One Leg  Unable to try or needs assist to prevent fall    Total Score  20         Vestibular Assessment - 09/14/18 0001      Occulomotor Exam   Head shaking Horizontal  R beating nystagmus      Vestibulo-Occular Reflex   VOR 1 Head Only (x 1 viewing)  dizzy with horizontal and vertical      Horizontal Canal Right   Horizontal Canal Right Duration  <30 seconds    Horizontal Canal Right Symptoms  Nystagmus      Positional Sensitivities   Supine to Left Side  No dizziness    Rolling Left  Lightheadedness               [facility] Adult PT Treatment/Exercise - 09/14/18 0001      Bed Mobility   Bed Mobility  Rolling Left      Exercises   Exercises  Knee/Hip      Knee/Hip Exercises: Seated   Other Seated Knee/Hip Exercises  heel/toe raises 10x    Sit to Sand  5 reps;with UE support   cueing for leg placement and HHA positioning for safety            PT Education - 09/14/18 1503    Education Details  Reviewed goals, educated on safe mechanics wiht STS and rational behind, BERG balance test complete, given HEP printout    Person(s) Educated  Patient;Child(ren)   son    Methods  Explanation;Demonstration;Handout    Comprehension  Verbalized understanding;Returned demonstration;Need further instruction  PT Short Term Goals - 09/07/18 1540      PT SHORT TERM GOAL #1   Title  Pt will be independent with HEP and perform consistently in order to improve strength and reduce dizziness.    Time  4    Period  Weeks    Status  New    Target Date  10/05/18      PT SHORT TERM GOAL #2   Title  Pt will report resolution of dizziness symptoms with rolling to the R in bed.     Time  4    Period  Weeks    Status  New      PT SHORT TERM GOAL #3   Title  Pt will have improved L DF MMT to 3/5 or better to maximize balance, gait, and reduce risk for falls.     Time  4    Period  Weeks    Status  New      PT SHORT TERM GOAL #4   Title  Pt will be able to perform 8 STS during 30sec chair rise test to demo improved balance and functional strength.     Time  4    Period  Weeks    Status  New        PT Long Term Goals - 09/07/18 1545      PT LONG TERM GOAL #1   Title  Pt will have improved strength by 1/2 grade throughout BLE MMT in order to improve balance and gait.     Time  8    Period  Weeks    Status  New    Target Date  11/02/18      PT LONG TERM GOAL #2   Title  Pt will score 40/56 on the BERG balance test or > to demo improved balance and reduce his risk for falls.     Time  8    Period  Weeks    Status  New      PT LONG TERM GOAL #3   Title  Pt will have 163ft improvement during with LRAD to demo improved tolerance to functional mobility and HH ambulation.    Time  8    Period  Weeks    Status  New      PT LONG TERM GOAL #4   Title  Pt will report active participation daily walking routine at least 1x/day for 15 mins or > in order to promote overall well-being and maximize his tolerance to functional mobility.     Time  8    Period  Weeks    Status  New            Plan - 09/14/18 1504    Clinical Impression Statement   Reviewed goals.  Manual BBQ maneuvers complete for canalith repositioning wiht reoprts of symptoms resolved following second technique.  Pt and son educated on self technqiues to complete at home with printout.  BERG balance testing complete with score 20/56.  Pt demonstrated unsafe mechanics with sitting and standing, educated importance of hand placement and feeling chair on back of legs prior sitting to reduce risk of fall.  Pt given HEP to improve rolling in bed, seated heel/toe rasies and safe mechanics iwth sit to stand.  No reports of pain, was limited by fatigue with activities.    Rehab Potential  Fair    PT Frequency  1x / week    PT Duration  8 weeks  PT Treatment/Interventions  ADLs/Self Care Home Management;Aquatic Therapy;Cryotherapy;Electrical Stimulation;Moist Heat;DME Instruction;Gait training;Stair training;Functional mobility training;Therapeutic activities;Therapeutic exercise;Balance training;Canalith Repostioning;Neuromuscular re-education;Orthotic Fit/Training;Patient/family education;Wheelchair mobility training;Manual techniques;Passive range of motion;Dry needling;Energy conservation;Splinting;Taping;Vestibular    PT Next Visit Plan  recheck supine roll test and treat accordingly;  Review compliance with STS.  Begin 2 point eye activity, LE strengthening and balance training.    PT Home Exercise Plan  09/14/18: rolling for canal repositioning, seated heel/toe raises, sit to stand with proper hand placements       Patient will benefit from skilled therapeutic intervention in order to improve the following deficits and impairments:  Abnormal gait, Decreased activity tolerance, Decreased balance, Decreased coordination, Decreased endurance, Decreased mobility, Decreased range of motion, Decreased strength, Difficulty walking, Dizziness, Hypomobility, Increased fascial restricitons, Impaired perceived functional ability, Impaired flexibility, Impaired sensation, Impaired tone,  Impaired UE functional use, Improper body mechanics, Postural dysfunction, Pain  Visit Diagnosis: BPPV (benign paroxysmal positional vertigo), right  Dizziness and giddiness  Hemiplegia and hemiparesis following cerebral infarction affecting left non-dominant side (HCC)  Difficulty in walking, not elsewhere classified  Muscle weakness (generalized)     Problem List Patient Active Problem List   Diagnosis Date Noted  . Spastic hemiparesis of left dominant side as late effect of cerebral infarction (HCC) 04/19/2017  . Gait disturbance, post-stroke 04/18/2017  . Adjustment disorder with depressed mood   . Cerebral infarction due to thrombosis of right middle cerebral artery (HCC) 04/13/2017  . Weakness   . Acute ischemic stroke (HCC) 04/12/2017  . Stroke (cerebrum) (HCC) 04/12/2017  . Acute CVA (cerebrovascular accident) (HCC) 04/12/2017  . CVA (cerebral vascular accident) (HCC) 03/20/2017  . Hydronephrosis, left 03/20/2017  . Aortic atherosclerosis (HCC) 03/20/2017  . Hepatitis C 02/14/2017  . Encounter for screening colonoscopy 02/14/2017  . Spondylolisthesis at L3-L4 level 06/03/2014   Becky Sax, LPTA; CBIS [phone]  Juel Burrow 09/14/2018, 3:17 PM  [facility] [address] [place], [place], [address] Phone: [phone]   Fax:  [phone]  Name: Brendan Charles MRN: [id number] Date of Birth: 09-13-1955

## 2018-09-14 NOTE — Patient Instructions (Addendum)
Rolling:   Laying on back with head rotated to the Rt.  Once dizziness resolved count to 30 seconds then rotate head to Lt.  Once dizziness resolved roll prone position, waiting 30 seconds once dizziness resolved continue rolling Lt until able to sit on edge on bed.  Toe / Heel Raise (Sitting)    Sitting, raise heels, then rock back on heels and raise toes. Repeat 10 times.  Copyright  VHI. All rights reserved.   Sit to stand: Remember to push with hands from chain to stand and reach for chair when preparing to sit.

## 2018-09-21 ENCOUNTER — Ambulatory Visit (HOSPITAL_COMMUNITY): Payer: Medicare Other

## 2018-09-27 ENCOUNTER — Telehealth (HOSPITAL_COMMUNITY): Payer: Self-pay | Admitting: Internal Medicine

## 2018-09-27 NOTE — Telephone Encounter (Signed)
09/27/18  Pt left a message to cx said that he was feeling rough

## 2018-09-28 ENCOUNTER — Ambulatory Visit (HOSPITAL_COMMUNITY): Payer: Medicare Other

## 2018-09-28 DIAGNOSIS — Z0001 Encounter for general adult medical examination with abnormal findings: Secondary | ICD-10-CM | POA: Diagnosis not present

## 2018-09-28 DIAGNOSIS — E785 Hyperlipidemia, unspecified: Secondary | ICD-10-CM | POA: Diagnosis not present

## 2018-09-28 DIAGNOSIS — Z1389 Encounter for screening for other disorder: Secondary | ICD-10-CM | POA: Diagnosis not present

## 2018-09-28 DIAGNOSIS — Z6822 Body mass index (BMI) 22.0-22.9, adult: Secondary | ICD-10-CM | POA: Diagnosis not present

## 2018-09-28 DIAGNOSIS — G894 Chronic pain syndrome: Secondary | ICD-10-CM | POA: Diagnosis not present

## 2018-09-28 DIAGNOSIS — R52 Pain, unspecified: Secondary | ICD-10-CM | POA: Diagnosis not present

## 2018-09-28 DIAGNOSIS — I10 Essential (primary) hypertension: Secondary | ICD-10-CM | POA: Diagnosis not present

## 2018-10-05 ENCOUNTER — Ambulatory Visit (HOSPITAL_COMMUNITY): Payer: Medicare Other | Admitting: Physical Therapy

## 2018-10-05 DIAGNOSIS — H8111 Benign paroxysmal vertigo, right ear: Secondary | ICD-10-CM | POA: Diagnosis not present

## 2018-10-05 DIAGNOSIS — M6281 Muscle weakness (generalized): Secondary | ICD-10-CM

## 2018-10-05 DIAGNOSIS — R262 Difficulty in walking, not elsewhere classified: Secondary | ICD-10-CM

## 2018-10-05 DIAGNOSIS — I69354 Hemiplegia and hemiparesis following cerebral infarction affecting left non-dominant side: Secondary | ICD-10-CM

## 2018-10-05 DIAGNOSIS — R42 Dizziness and giddiness: Secondary | ICD-10-CM

## 2018-10-05 NOTE — Therapy (Signed)
[facility] [address] [place], [place], [address] Phone: [phone]   Fax:  [phone]  Physical Therapy Treatment  Patient Details  Name: Brendan Charles MRN: [id number] Date of Birth: 11/29/1955 Referring Provider (PT): Shon Millet, MD   Encounter Date: 10/05/2018  PT End of Session - 10/05/18 0854    Visit Number  3    Number of Visits  9    Date for PT Re-Evaluation  11/02/18   Minireassess 10/05/18   Authorization Type  [employer] Medicare    Authorization Time Period  09/07/18 to 11/02/18    Authorization - Visit Number  3    Authorization - Number of Visits  10    PT Start Time  0815    PT Stop Time  0900    PT Time Calculation (min)  45 min    Equipment Utilized During Treatment  Gait belt    Activity Tolerance  Patient tolerated treatment well    Behavior During Therapy  [facility] for tasks assessed/performed       Past Medical History:  Diagnosis Date  . Anxiety   . Chronic back pain   . CVA (cerebral vascular accident) (HCC) 03/20/2017  . Hepatitis C    HEP C, never treated, 2007  . Hypertension     Past Surgical History:  Procedure Laterality Date  . CERVICAL DISC SURGERY  2001   anterior  . COLONOSCOPY WITH PROPOFOL N/A 03/14/2017   Procedure: COLONOSCOPY WITH PROPOFOL;  Surgeon: West Bali, MD;  [facility];  Service: Endoscopy;  Laterality: N/A;  8:15am  . CYSTOSCOPY W/ URETERAL STENT PLACEMENT Left 03/23/2017   Procedure: CYSTOSCOPY WITH LEFT RETROGRADE PYELOGRAM/URETERAL LEFT STENT PLACEMENT;  Surgeon: Malen Gauze, MD;  [facility];  Service: Urology;  Laterality: Left;  . CYSTOSCOPY/RETROGRADE/URETEROSCOPY Left 07/17/2017   Procedure: CYSTOSCOPY/RETROGRADE/URETEROSCOPY, stent removal, stone extraction, stent replacement;  Surgeon: Malen Gauze, MD;  [facility]: [facility];  Service: Urology;  Laterality: Left;  . HOLMIUM LASER APPLICATION Left 07/17/2017   Procedure: HOLMIUM LASER  APPLICATION;  Surgeon: Malen Gauze, MD;  [facility]: [facility];  Service: Urology;  Laterality: Left;  . LUMBAR SPINE SURGERY  2015  . POLYPECTOMY  03/14/2017   Procedure: POLYPECTOMY;  Surgeon: West Bali, MD;  [facility];  Service: Endoscopy;;  rectum  . TEE WITHOUT CARDIOVERSION N/A 09/19/2017   Procedure: TRANSESOPHAGEAL ECHOCARDIOGRAM (TEE);  Surgeon: Antoine Poche, MD;  [facility];  Service: Endoscopy;  Laterality: N/A;    There were no vitals filed for this visit.  Subjective Assessment - 10/05/18 0819    Subjective  PT states that his dizziness is about the same. States he has not been to treatment due to being ill.     Patient Stated Goals  get right    Currently in Pain?  No/denies    Pain Onset  More than a month ago             Vestibular Assessment - 10/05/18 0001      Positional Testing   Dix-Hallpike  Dix-Hallpike Right;Dix-Hallpike Left    Horizontal Canal Testing  Horizontal Canal Right;Horizontal Canal Left      Dix-Hallpike Right   Dix-Hallpike Right Duration  positive    Dix-Hallpike Right Symptoms  Upbeat, left rotatory nystagmus      Dix-Hallpike Left   Dix-Hallpike Left Duration  negative      Horizontal Canal Right   Horizontal  Canal Right Duration  <10 seconds   (Pended)     Horizontal Canal Right Symptoms  Ageotrophic  (Pended)       Positional Sensitivities   Up from Right Hallpike  Moderate dizziness    Up from Left Hallpike  Lightheadedness               [facility] Adult PT Treatment/Exercise - 10/05/18 0001      Exercises   Exercises  Neck      Neck Exercises: Seated   Neck Retraction  10 reps    Other Seated Exercise  sit tall x 5; scapular retraction/ cervical retracion x 10       Neck Exercises: Supine   Other Supine Exercise  scapular and cervical retraciton x 10      Vestibular Treatment/Exercise - 10/05/18 0001      Vestibular Treatment/Exercise   Vestibular Treatment Provided   Canalith Repositioning    Canalith Repositioning  Epley Manuever Right       EPLEY MANUEVER RIGHT   Number of Reps   2    Overall Response  Improved Symptoms              PT Short Term Goals - 09/07/18 1540      PT SHORT TERM GOAL #1   Title  Pt will be independent with HEP and perform consistently in order to improve strength and reduce dizziness.    Time  4    Period  Weeks    Status  New    Target Date  10/05/18      PT SHORT TERM GOAL #2   Title  Pt will report resolution of dizziness symptoms with rolling to the R in bed.     Time  4    Period  Weeks    Status  New      PT SHORT TERM GOAL #3   Title  Pt will have improved L DF MMT to 3/5 or better to maximize balance, gait, and reduce risk for falls.     Time  4    Period  Weeks    Status  New      PT SHORT TERM GOAL #4   Title  Pt will be able to perform 8 STS during 30sec chair rise test to demo improved balance and functional strength.     Time  4    Period  Weeks    Status  New        PT Long Term Goals - 09/07/18 1545      PT LONG TERM GOAL #1   Title  Pt will have improved strength by 1/2 grade throughout BLE MMT in order to improve balance and gait.     Time  8    Period  Weeks    Status  New    Target Date  11/02/18      PT LONG TERM GOAL #2   Title  Pt will score 40/56 on the BERG balance test or > to demo improved balance and reduce his risk for falls.     Time  8    Period  Weeks    Status  New      PT LONG TERM GOAL #3   Title  Pt will have 135ft improvement during with LRAD to demo improved tolerance to functional mobility and HH ambulation.    Time  8    Period  Weeks    Status  New  PT LONG TERM GOAL #4   Title  Pt will report active participation daily walking routine at least 1x/day for 15 mins or > in order to promote overall well-being and maximize his tolerance to functional mobility.     Time  8    Period  Weeks    Status  New            Plan - 10/05/18  1206    Clinical Impression Statement  Completed Dix-Hallpike which was + on RT, Eply manuever completed for correction.  Counselled pt the importance of good posture giving pt HEP to workon strengthening postural mm. PT had Positive rt horizontal canal as well, however, time limited repositioning.     Rehab Potential  Fair    PT Frequency  1x / week    PT Duration  8 weeks    PT Treatment/Interventions  ADLs/Self Care Home Management;Aquatic Therapy;Cryotherapy;Electrical Stimulation;Moist Heat;DME Instruction;Gait training;Stair training;Functional mobility training;Therapeutic activities;Therapeutic exercise;Balance training;Canalith Repostioning;Neuromuscular re-education;Orthotic Fit/Training;Patient/family education;Wheelchair mobility training;Manual techniques;Passive range of motion;Dry needling;Energy conservation;Splinting;Taping;Vestibular    PT Next Visit Plan  begin checking for RT anterior and transverse canal; correction to whichever sx is more pronounced.  Emphasize the importance of good posture.     PT Home Exercise Plan  09/14/18: rolling for canal repositioning, seated heel/toe raises, sit to stand with proper hand placements       Patient will benefit from skilled therapeutic intervention in order to improve the following deficits and impairments:  Abnormal gait, Decreased activity tolerance, Decreased balance, Decreased coordination, Decreased endurance, Decreased mobility, Decreased range of motion, Decreased strength, Difficulty walking, Dizziness, Hypomobility, Increased fascial restricitons, Impaired perceived functional ability, Impaired flexibility, Impaired sensation, Impaired tone, Impaired UE functional use, Improper body mechanics, Postural dysfunction, Pain  Visit Diagnosis: BPPV (benign paroxysmal positional vertigo), right  Dizziness and giddiness  Difficulty in walking, not elsewhere classified  Hemiplegia and hemiparesis following cerebral infarction  affecting left non-dominant side (HCC)  Muscle weakness (generalized)     Problem List Patient Active Problem List   Diagnosis Date Noted  . Spastic hemiparesis of left dominant side as late effect of cerebral infarction (HCC) 04/19/2017  . Gait disturbance, post-stroke 04/18/2017  . Adjustment disorder with depressed mood   . Cerebral infarction due to thrombosis of right middle cerebral artery (HCC) 04/13/2017  . Weakness   . Acute ischemic stroke (HCC) 04/12/2017  . Stroke (cerebrum) (HCC) 04/12/2017  . Acute CVA (cerebrovascular accident) (HCC) 04/12/2017  . CVA (cerebral vascular accident) (HCC) 03/20/2017  . Hydronephrosis, left 03/20/2017  . Aortic atherosclerosis (HCC) 03/20/2017  . Hepatitis C 02/14/2017  . Encounter for screening colonoscopy 02/14/2017  . Spondylolisthesis at L3-L4 level 06/03/2014    Virgina Organ, PT CLT [phone] 10/05/2018, 12:08 PM  [facility] [address] [place], [place], [address] Phone: [phone]   Fax:  [phone]  Name: BREVAN LUBERTO MRN: [id number] Date of Birth: 11-29-1955

## 2018-10-05 NOTE — Patient Instructions (Addendum)
Flexibility: Neck Retraction    Pull head straight back, keeping eyes and jaw level. Repeat 10____ times per set. Do __1__ sets per session. Do ___2_ sessions per day.  http://orth.exer.us/344   Copyright  VHI. All rights reserved.  AROM: Neck Rotation    Turn head slowly to look over one shoulder, then the other. Hold each position _3___ seconds. Repeat 10____ times per set. Do _1___ sets per session. Do _2___ sessions per day.  http://orth.exer.us/294   Copyright  VHI. All rights reserved.  Scapular Retraction (Standing)   Do sitting  With arms at sides, pinch shoulder blades together. Repeat _10___ times per set. Do __1__ sets per session. Do __2__ sessions per day.  http://orth.exer.us/944   Copyright  VHI. All rights reserved.

## 2018-10-12 ENCOUNTER — Encounter (HOSPITAL_COMMUNITY): Payer: Self-pay

## 2018-10-12 ENCOUNTER — Ambulatory Visit (HOSPITAL_COMMUNITY): Payer: Medicare Other | Attending: Neurology

## 2018-10-12 DIAGNOSIS — H8111 Benign paroxysmal vertigo, right ear: Secondary | ICD-10-CM | POA: Insufficient documentation

## 2018-10-12 DIAGNOSIS — M6281 Muscle weakness (generalized): Secondary | ICD-10-CM | POA: Diagnosis not present

## 2018-10-12 DIAGNOSIS — R262 Difficulty in walking, not elsewhere classified: Secondary | ICD-10-CM | POA: Insufficient documentation

## 2018-10-12 DIAGNOSIS — R42 Dizziness and giddiness: Secondary | ICD-10-CM | POA: Diagnosis not present

## 2018-10-12 DIAGNOSIS — I69354 Hemiplegia and hemiparesis following cerebral infarction affecting left non-dominant side: Secondary | ICD-10-CM | POA: Diagnosis not present

## 2018-10-12 NOTE — Therapy (Signed)
[facility] [address] [place], [place], [address] Phone: [phone]   Fax:  [phone]  Physical Therapy Treatment  Patient Details  Name: Brendan Charles MRN: [id number] Date of Birth: 05-01-1956 Referring Provider (PT): Shon MilletAdam Jaffe, MD   Encounter Date: 10/12/2018  PT End of Session - 10/12/18 0809    Visit Number  4    Number of Visits  9    Date for PT Re-Evaluation  11/02/18   Minireassess 10/05/18   Authorization Type  [employer] Medicare    Authorization Time Period  09/07/18 to 11/02/18    Authorization - Visit Number  4    Authorization - Number of Visits  10    PT Start Time  0815    PT Stop Time  0855    PT Time Calculation (min)  40 min    Equipment Utilized During Treatment  Gait belt    Activity Tolerance  Patient tolerated treatment well    Behavior During Therapy  [facility] for tasks assessed/performed       Past Medical History:  Diagnosis Date  . Anxiety   . Chronic back pain   . CVA (cerebral vascular accident) (HCC) 03/20/2017  . Hepatitis C    HEP C, never treated, 2007  . Hypertension     Past Surgical History:  Procedure Laterality Date  . CERVICAL DISC SURGERY  2001   anterior  . COLONOSCOPY WITH PROPOFOL N/A 03/14/2017   Procedure: COLONOSCOPY WITH PROPOFOL;  Surgeon: West BaliFields, Sandi L, MD;  [facility];  Service: Endoscopy;  Laterality: N/A;  8:15am  . CYSTOSCOPY W/ URETERAL STENT PLACEMENT Left 03/23/2017   Procedure: CYSTOSCOPY WITH LEFT RETROGRADE PYELOGRAM/URETERAL LEFT STENT PLACEMENT;  Surgeon: Malen GauzeMcKenzie, Patrick L, MD;  Location: [facility];  Service: Urology;  Laterality: Left;  . CYSTOSCOPY/RETROGRADE/URETEROSCOPY Left 07/17/2017   Procedure: CYSTOSCOPY/RETROGRADE/URETEROSCOPY, stent removal, stone extraction, stent replacement;  Surgeon: Malen GauzeMcKenzie, Patrick L, MD;  Location: [facility];  Service: Urology;  Laterality: Left;  . HOLMIUM LASER APPLICATION Left 07/17/2017   Procedure: HOLMIUM LASER  APPLICATION;  Surgeon: Malen GauzeMcKenzie, Patrick L, MD;  Location: [facility];  Service: Urology;  Laterality: Left;  . LUMBAR SPINE SURGERY  2015  . POLYPECTOMY  03/14/2017   Procedure: POLYPECTOMY;  Surgeon: West BaliFields, Sandi L, MD;  Location: [facility];  Service: Endoscopy;;  rectum  . TEE WITHOUT CARDIOVERSION N/A 09/19/2017   Procedure: TRANSESOPHAGEAL ECHOCARDIOGRAM (TEE);  Surgeon: Antoine PocheBranch, Jonathan F, MD;  Location[facility];  Service: Endoscopy;  Laterality: N/A;    There were no vitals filed for this visit.  Subjective Assessment - 10/12/18 0818    Subjective  Pt states that he is still feeling his dizziness but it is a little bit better since his last session. It still occurs when he rolls or turns his head to the R.    Patient Stated Goals  get right    Currently in Pain?  No/denies    Pain Onset  More than a month ago           Vestibular Assessment - 10/12/18 0001      Positional Testing   Dix-Hallpike  Dix-Hallpike Right    Horizontal Canal Testing  Horizontal Canal Right      Dix-Hallpike Right   Dix-Hallpike Right Duration  negative      Horizontal Canal Right   Horizontal Canal Right Duration  <10 seconds     Horizontal Canal Right Symptoms  --  no nystagmus noted, but recreated same symptoms         [facility] Adult PT Treatment/Exercise - 10/12/18 0001      Neck Exercises: Theraband   Scapula Retraction  10 reps;Red    Scapula Retraction Limitations  3 sets, seated    Shoulder Extension  10 reps;Red    Shoulder Extension Limitations  3 sets, seated      Neck Exercises: Seated   Neck Retraction  15 reps    Other Seated Exercise  scap retraction x15 reps      Vestibular Treatment/Exercise - 10/12/18 0001      Vestibular Treatment/Exercise   Vestibular Treatment Provided  Canalith Repositioning    Canalith Repositioning  Canal Roll Right      Canal Roll Right   Number of Reps   2    Overall Response   Improved Symptoms    Response Details   BBQ R x2 to  treat positive symptoms during horizontal canal test to the R; improved overall symptoms during 2nd treatment as his symptoms did not last quite as long as first rep            PT Education - 10/12/18 0811    Education Details  exercise technique, continue and updated HEP    Person(s) Educated  Patient;Child(ren)    Methods  Explanation;Demonstration;Handout    Comprehension  Verbalized understanding;Returned demonstration       PT Short Term Goals - 09/07/18 1540      PT SHORT TERM GOAL #1   Title  Pt will be independent with HEP and perform consistently in order to improve strength and reduce dizziness.    Time  4    Period  Weeks    Status  New    Target Date  10/05/18      PT SHORT TERM GOAL #2   Title  Pt will report resolution of dizziness symptoms with rolling to the R in bed.     Time  4    Period  Weeks    Status  New      PT SHORT TERM GOAL #3   Title  Pt will have improved L DF MMT to 3/5 or better to maximize balance, gait, and reduce risk for falls.     Time  4    Period  Weeks    Status  New      PT SHORT TERM GOAL #4   Title  Pt will be able to perform 8 STS during 30sec chair rise test to demo improved balance and functional strength.     Time  4    Period  Weeks    Status  New        PT Long Term Goals - 09/07/18 1545      PT LONG TERM GOAL #1   Title  Pt will have improved strength by 1/2 grade throughout BLE MMT in order to improve balance and gait.     Time  8    Period  Weeks    Status  New    Target Date  11/02/18      PT LONG TERM GOAL #2   Title  Pt will score 40/56 on the BERG balance test or > to demo improved balance and reduce his risk for falls.     Time  8    Period  Weeks    Status  New      PT LONG TERM GOAL #3   Title  Pt will have 141ft improvement during with LRAD to demo improved tolerance to functional mobility and HH ambulation.    Time  8    Period  Weeks    Status  New      PT LONG TERM GOAL #4   Title   Pt will report active participation daily walking routine at least 1x/day for 15 mins or > in order to promote overall well-being and maximize his tolerance to functional mobility.     Time  8    Period  Weeks    Status  New            Plan - 10/12/18 0859    Clinical Impression Statement  Pt stating overall improvement in dizziness but is still having symptoms when he turns/rolls or looks to the R. Dix-Hallpike to the R negative this date so assessed Horizontal canals and pt + for symptoms and mild nystagmus to the Right. Able to treat x2 with BBQ roll, with slightly reduced symptoms during 2nd treatment. Ended session with postural training and strengthening. Updated HEP to include cervical retraction, scap retraction, scap retraction and GH extension with theraband, and seated march, all to facilitate improved posture and assist with improving his gait. Can begin addressing pt's deficits from his stroke next visit if his dizziness is minimal to none so as to improve his strength and balance, reduce his risk for falls, and maximize function at home.     Rehab Potential  Fair    PT Frequency  1x / week    PT Duration  8 weeks    PT Treatment/Interventions  ADLs/Self Care Home Management;Aquatic Therapy;Cryotherapy;Electrical Stimulation;Moist Heat;DME Instruction;Gait training;Stair training;Functional mobility training;Therapeutic activities;Therapeutic exercise;Balance training;Canalith Repostioning;Neuromuscular re-education;Orthotic Fit/Training;Patient/family education;Wheelchair mobility training;Manual techniques;Passive range of motion;Dry needling;Energy conservation;Splinting;Taping;Vestibular    PT Next Visit Plan  continue check of R horizontal canal; correction to whichever sx is more pronounced.  Emphasize the importance of good posture.     PT Home Exercise Plan  09/14/18: rolling for canal repositioning, seated heel/toe raises, sit to stand with proper hand placements; 3/6:  cervical and scap retraction, seated scap retraction and GH extension with theraband, seated march    Consulted and Agree with Plan of Care  Patient;Family member/caregiver    Family Member Consulted  son       Patient will benefit from skilled therapeutic intervention in order to improve the following deficits and impairments:  Abnormal gait, Decreased activity tolerance, Decreased balance, Decreased coordination, Decreased endurance, Decreased mobility, Decreased range of motion, Decreased strength, Difficulty walking, Dizziness, Hypomobility, Increased fascial restricitons, Impaired perceived functional ability, Impaired flexibility, Impaired sensation, Impaired tone, Impaired UE functional use, Improper body mechanics, Postural dysfunction, Pain  Visit Diagnosis: BPPV (benign paroxysmal positional vertigo), right  Dizziness and giddiness  Hemiplegia and hemiparesis following cerebral infarction affecting left non-dominant side (HCC)  Difficulty in walking, not elsewhere classified  Muscle weakness (generalized)     Problem List Patient Active Problem List   Diagnosis Date Noted  . Spastic hemiparesis of left dominant side as late effect of cerebral infarction (HCC) 04/19/2017  . Gait disturbance, post-stroke 04/18/2017  . Adjustment disorder with depressed mood   . Cerebral infarction due to thrombosis of right middle cerebral artery (HCC) 04/13/2017  . Weakness   . Acute ischemic stroke (HCC) 04/12/2017  . Stroke (cerebrum) (HCC) 04/12/2017  . Acute CVA (cerebrovascular accident) (HCC) 04/12/2017  . CVA (cerebral vascular accident) (HCC) 03/20/2017  . Hydronephrosis, left 03/20/2017  . Aortic  atherosclerosis (HCC) 03/20/2017  . Hepatitis C 02/14/2017  . Encounter for screening colonoscopy 02/14/2017  . Spondylolisthesis at L3-L4 level 06/03/2014       Jac Canavan PT, DPT  [facility] [address] [place], [place],  [address] Phone: [phone]   Fax:  [phone]  Name: Brendan Charles MRN: [id number] Date of Birth: [birth year]

## 2018-10-19 ENCOUNTER — Ambulatory Visit (HOSPITAL_COMMUNITY): Payer: Medicare Other

## 2018-10-19 ENCOUNTER — Telehealth (HOSPITAL_COMMUNITY): Payer: Self-pay

## 2018-10-19 NOTE — Telephone Encounter (Signed)
No show #1; called re missed appointment and pt stating that he had diarrhea. PT reminded pt of next appointment and asked that if he can't make his appointments to give the office a call and he verbalized understanding.    Jac Canavan PT, DPT

## 2018-10-26 ENCOUNTER — Ambulatory Visit (HOSPITAL_COMMUNITY): Payer: Medicare Other

## 2018-10-29 DIAGNOSIS — J22 Unspecified acute lower respiratory infection: Secondary | ICD-10-CM | POA: Diagnosis not present

## 2018-10-29 DIAGNOSIS — M5136 Other intervertebral disc degeneration, lumbar region: Secondary | ICD-10-CM | POA: Diagnosis not present

## 2018-10-29 DIAGNOSIS — R52 Pain, unspecified: Secondary | ICD-10-CM | POA: Diagnosis not present

## 2018-10-29 DIAGNOSIS — G894 Chronic pain syndrome: Secondary | ICD-10-CM | POA: Diagnosis not present

## 2018-11-02 ENCOUNTER — Ambulatory Visit (HOSPITAL_COMMUNITY): Payer: Medicare Other

## 2018-11-07 ENCOUNTER — Telehealth (HOSPITAL_COMMUNITY): Payer: Self-pay

## 2018-11-07 NOTE — Telephone Encounter (Signed)
Left pt voicemail regarding cancelling all appointments until further notice. Asked that pt call our clinic back so that I could talk to him personally about how he is feeling and how his vertigo has been doing. Also educated pt that our clinic would be in touch with him regarding getting him scheduled for more appointments once our clinic reopens.   Jac Canavan PT, DPT

## 2018-11-07 NOTE — Telephone Encounter (Signed)
Pt returned my phone call and stated that his dizziness is still there a little bit but it is not bad at all. PT feels that pt is safe to continue his HEP at home and he did not have any questions or concerns regarding them. Educated pt that he would hear from our clinic once we reopen with regards to getting him rescheduled but he could call us at any time with questions.  Jac Canavan PT, DPT

## 2018-11-16 ENCOUNTER — Encounter (HOSPITAL_COMMUNITY): Payer: Self-pay

## 2018-11-27 DIAGNOSIS — E7849 Other hyperlipidemia: Secondary | ICD-10-CM | POA: Diagnosis not present

## 2018-11-27 DIAGNOSIS — G894 Chronic pain syndrome: Secondary | ICD-10-CM | POA: Diagnosis not present

## 2018-11-27 DIAGNOSIS — I10 Essential (primary) hypertension: Secondary | ICD-10-CM | POA: Diagnosis not present

## 2018-12-24 DIAGNOSIS — R52 Pain, unspecified: Secondary | ICD-10-CM | POA: Diagnosis not present

## 2019-01-27 ENCOUNTER — Other Ambulatory Visit: Payer: Self-pay | Admitting: Neurology

## 2019-02-13 DIAGNOSIS — G894 Chronic pain syndrome: Secondary | ICD-10-CM | POA: Diagnosis not present

## 2019-02-19 ENCOUNTER — Encounter (HOSPITAL_COMMUNITY): Payer: Self-pay

## 2019-02-19 NOTE — Therapy (Signed)
[place], [place], [address] Phone: [phone]   Fax:  [phone]  Patient Details  Name: CORNELUIS ALLSTON MRN: [id number] Date of Birth: 1956/03/28 Referring Provider:  No ref. provider found  Encounter Date: 02/19/2019  PHYSICAL THERAPY DISCHARGE SUMMARY  Visits from Start of Care: 4  Current functional level related to goals / functional outcomes: See last note   Remaining deficits: See last  note   Education / Equipment: n/a  Plan: Patient agrees to discharge.  Patient goals were not met. Patient is being discharged due to not returning since the last visit.  ?????      Geraldine Solar PT, [place] [address] [place], [place], [address] Phone: [phone]   Fax:  [phone]

## 2019-02-25 NOTE — Progress Notes (Signed)
NEUROLOGY FOLLOW UP OFFICE NOTE  Brendan Charles [id number]  HISTORY OF PRESENT ILLNESS: Brendan Charles is a 63 year old right-handed male with tobacco use, hypertension and history of prior strokes and hepatitis C who follows up for stroke and TIAs.  He is accompanied by his son who supplements history.  UPDATE: Current medications include Plavix, losartan, metoprolol and atorvastatin 80 mg daily.  At the beginning of the year, he underwent vestibular rehab for BPPV.  It resolved but was cancelled due to COVID-19.  Sometimes when he stands, he feels a little dizzy.    HISTORY: He was admitted to [facility] from 03/20/17 to 03/21/17 for stroke. He underwent colonscopy a week prior to admission and developed slurred speech. A few days later, he developed right sided weakness and speech difficulty but did not seek immediate medical attention. The following day, he developed left lower quadrant pain and then went to the ED. MRI of brain revealed acute left basal ganglia ischemic infarct extending towards the corona radiata with associated high-grade stenosis or occlusion of the anterior inferior left M2 segment on MRA. It also revealed moderate to high-grade stenosis of the proximal right vertebral artery with distal flow beyond the PICA as well as 1.5 mm aneurysm or more likely infundibulum of the left PCOM artery. Carotid doppler revealed small plaque in left ICA but no hemodynamically significant ICA stenosis bilaterally. TTE showed EF 60-65% with no atrial septal defect/PFO or other cardiac source of emboli. LDL was 89, Hgb A1c 5.5, HIV antibody negative, RPR negative, TSH 2.892, and B12 318. He was started on ASA 325mg  and Plavix 75mg  and atorvastatin 80mg .   He was readmitted to [facility] from 04/12/17 to 04/13/17 for sudden onset left upper extremity numbness and weakness. Due to recent stroke, he was not tPA candidate. MRI of brain revealed acute right medullary lacunar  ischemic infarct. CTA of head and neck again revealed severe tandem right V4 vertebral artery stenosis proximal to PICA origin but with patent carotid arteries and intracranial vasculature. He was continued on dual antiplatelet therapy with Lipitor 80mg . He was discharged to rehab for 1 week. He again presented to the ED on 05/18/17 after waking up in the morning with new left sided numbness and weakness. CT of head showed chronic lacunar infarcts but nothing acute. MRI of brain showed expected evolution of his recent left basal ganglia/corona radiata and right medullary infarcts but no acute findings. MRA of head was again notable for unchanged severe tandem right V4 vertebral artery stenosis.  TEE from 09/19/17 demonstratee LV EF 55-60% with no PFO or atrial septal defect or evidence of intracardiac thrombus. It did reveal questionable semi-mobile plaque in the proximal ascending aorta. Follow up CTA of chest from 12/22/17 showed no abnormality to correspond with finding on the TEE.LDL from 08/28/17 was 68.  In July 2019, he endorsed 1 to 2 months of worsening left-sided numbness.  MRI of the brain from 03/02/2018 was personally reviewed and again demonstrated advanced chronic small vessel ischemic changes but no acute infarct.  24-hour Holter monitor was again ordered but not performed.  PAST MEDICAL HISTORY: Past Medical History:  Diagnosis Date  . Anxiety   . Chronic back pain   . CVA (cerebral vascular accident) ([place]) 03/20/2017  . Hepatitis C    HEP C, never treated, 2007  . Hypertension     MEDICATIONS: Current Outpatient Medications on File Prior to Visit  Medication Sig Dispense Refill  . ALPRAZolam (XANAX) 0.5 MG  tablet Take 0.5 mg by mouth 3 (three) times daily as needed for anxiety.    Marland Kitchen. amLODipine (NORVASC) 5 MG tablet Take 5 mg by mouth daily.  11  . atorvastatin (LIPITOR) 80 MG tablet Take 1 tablet (80 mg total) by mouth at bedtime. 30 tablet 5  . atorvastatin (LIPITOR)  80 MG tablet TAKE 1 TABLET(80 MG) BY MOUTH DAILY 90 tablet 3  . clopidogrel (PLAVIX) 75 MG tablet Take 1 tablet (75 mg total) by mouth daily with breakfast. 30 tablet 5  . clopidogrel (PLAVIX) 75 MG tablet Take 1 tablet (75 mg total) by mouth daily. 90 tablet 3  . escitalopram (LEXAPRO) 10 MG tablet Take 10 mg by mouth daily.  11  . losartan (COZAAR) 100 MG tablet Take 100 mg by mouth daily.  11  . metoprolol tartrate (LOPRESSOR) 25 MG tablet Take 1 tablet (25 mg total) by mouth 2 (two) times daily. 60 tablet 0  . oxyCODONE-acetaminophen (PERCOCET) 7.5-325 MG tablet Take 1 tablet by mouth every 4 (four) hours as needed for severe pain. 30 tablet 0   No current facility-administered medications on file prior to visit.     ALLERGIES: Allergies  Allergen Reactions  . Codeine Itching and Nausea Only    FAMILY HISTORY: Family History  Problem Relation Age of Onset  . Cancer Mother   . Dementia Sister   . Colon cancer Neg Hx     SOCIAL HISTORY: Social History   Socioeconomic History  . Marital status: Divorced    Spouse name: Not on file  . Number of children: Not on file  . Years of education: Not on file  . Highest education level: Associate degree: academic program  Occupational History  . Occupation: retired    Comment: former truck Runner, broadcasting/film/videodriver  Social Needs  . Financial resource strain: Not on file  . Food insecurity    Worry: Not on file    Inability: Not on file  . Transportation needs    Medical: Not on file    Non-medical: Not on file  Tobacco Use  . Smoking status: Former Smoker    Packs/day: 0.50    Years: 45.00    Pack years: 22.50    Types: Cigarettes    Quit date: 03/2017    Years since quitting: 1.9  . Smokeless tobacco: Never Used  . Tobacco comment: pt states quit smoking 1 wk ago  Substance and Sexual Activity  . Alcohol use: No  . Drug use: No  . Sexual activity: Not Currently    Birth control/protection: None  Lifestyle  . Physical activity     Days per week: Not on file    Minutes per session: Not on file  . Stress: Not on file  Relationships  . Social Musicianconnections    Talks on phone: Not on file    Gets together: Not on file    Attends religious service: Not on file    Active member of club or organization: Not on file    Attends meetings of clubs or organizations: Not on file    Relationship status: Not on file  . Intimate partner violence    Fear of current or ex partner: Not on file    Emotionally abused: Not on file    Physically abused: Not on file    Forced sexual activity: Not on file  Other Topics Concern  . Not on file  Social History Narrative   Divorced, lives with son in a 2 story  home, though Pt stays in 1st level. He drinks one cup of coffee and one soda a day. Is to begin O/P P/T next week.    REVIEW OF SYSTEMS: Constitutional: No fevers, chills, or sweats, no generalized fatigue, change in appetite Eyes: No visual changes, double vision, eye pain Ear, nose and throat: No hearing loss, ear pain, nasal congestion, sore throat Cardiovascular: No chest pain, palpitations Respiratory:  No shortness of breath at rest or with exertion, wheezes GastrointestinaI: No nausea, vomiting, diarrhea, abdominal pain, fecal incontinence Genitourinary:  No dysuria, urinary retention or frequency Musculoskeletal:  No neck pain, back pain Integumentary: No rash, pruritus, skin lesions Neurological: as above Psychiatric: No depression, insomnia, anxiety Endocrine: No palpitations, fatigue, diaphoresis, mood swings, change in appetite, change in weight, increased thirst Hematologic/Lymphatic:  No purpura, petechiae. Allergic/Immunologic: no itchy/runny eyes, nasal congestion, recent allergic reactions, rashes  PHYSICAL EXAM: Blood pressure 123/77, pulse 66, temperature 98 F (36.7 C), temperature source Oral, height 5\' 8"  (1.727 m), weight 148 lb (67.1 kg), SpO2 98 %. General: No acute distress.  Patient appears  well-groomed.   Head:  Normocephalic/atraumatic Eyes:  Fundi examined but not visualized Neck: supple, no paraspinal tenderness, full range of motion Heart:  Regular rate and rhythm Lungs:  Clear to auscultation bilaterally Back: No paraspinal tenderness Neurological Exam: alert and oriented to person, place, and time. Attention span and concentration intact, recent and remote memory intact, fund of knowledge intact.  Speech fluent and not dysarthric, language intact.  Mild left lower facial weakness.  Otherwise, CN II-XII intact. Mild increased tone on left, muscle strength 4+/5 left upper and lower extremities, 5/5 on right  Sensation to light touch intact.  Deep tendon reflexes 3+ left upper and lower extremities, 2+ right sided.  Finger to nose testing intact.  Ambulates with walker.  IMPRESSION: 1.  Multiple strokes and TIAs secondary to small vessel disease 2.  Residual left sided numbness secondary to stroke 3.  Hypertension 4.  Hyperlipidemia 5.  Former tobacco user (quit 2018) 6.  BPPV  PLAN: 1.  Plavix 75mg  for secondary stroke prevention 2.  Atorvastatin 80mg  daily (LDL goal less than 70) 3.  Blood pressure management 4.  Advised to contact PT to see if vestibular rehab services are reopened.  Contact me if they need new referral 5.  Follow up in one year  25 minutes spent face to face with patient, over 50% spent discussing management  Shon MilletAdam , DO   CC:  Elfredia NevinsLawrence Fusco, MD

## 2019-02-26 ENCOUNTER — Ambulatory Visit (INDEPENDENT_AMBULATORY_CARE_PROVIDER_SITE_OTHER): Payer: Medicare Other | Admitting: Neurology

## 2019-02-26 ENCOUNTER — Other Ambulatory Visit: Payer: Self-pay

## 2019-02-26 ENCOUNTER — Encounter: Payer: Self-pay | Admitting: Neurology

## 2019-02-26 VITALS — BP 123/77 | HR 66 | Temp 98.0°F | Ht 68.0 in | Wt 148.0 lb

## 2019-02-26 DIAGNOSIS — H8110 Benign paroxysmal vertigo, unspecified ear: Secondary | ICD-10-CM | POA: Diagnosis not present

## 2019-02-26 DIAGNOSIS — E785 Hyperlipidemia, unspecified: Secondary | ICD-10-CM

## 2019-02-26 DIAGNOSIS — I10 Essential (primary) hypertension: Secondary | ICD-10-CM | POA: Diagnosis not present

## 2019-02-26 DIAGNOSIS — I69352 Hemiplegia and hemiparesis following cerebral infarction affecting left dominant side: Secondary | ICD-10-CM

## 2019-02-26 NOTE — Patient Instructions (Signed)
1.  Continue clopidogrel 75mg  daily for secondary stroke prevention 2.  Continue cholesterol and blood pressure medications 3.  Contact the physical therapy center to return for vestibular rehab.  If they need a new referral, then contact me 4.  Follow up in one year.

## 2019-03-02 ENCOUNTER — Encounter (HOSPITAL_COMMUNITY): Payer: Self-pay | Admitting: Emergency Medicine

## 2019-03-02 ENCOUNTER — Emergency Department (HOSPITAL_COMMUNITY): Payer: Medicare Other

## 2019-03-02 ENCOUNTER — Emergency Department (HOSPITAL_COMMUNITY)
Admission: EM | Admit: 2019-03-02 | Discharge: 2019-03-03 | Disposition: A | Discharge status: Home / Self Care | Payer: Medicare Other | Attending: Emergency Medicine | Admitting: Emergency Medicine

## 2019-03-02 ENCOUNTER — Other Ambulatory Visit: Payer: Self-pay

## 2019-03-02 DIAGNOSIS — I10 Essential (primary) hypertension: Secondary | ICD-10-CM | POA: Insufficient documentation

## 2019-03-02 DIAGNOSIS — Z79899 Other long term (current) drug therapy: Secondary | ICD-10-CM | POA: Insufficient documentation

## 2019-03-02 DIAGNOSIS — R202 Paresthesia of skin: Secondary | ICD-10-CM | POA: Insufficient documentation

## 2019-03-02 DIAGNOSIS — Z7902 Long term (current) use of antithrombotics/antiplatelets: Secondary | ICD-10-CM | POA: Diagnosis not present

## 2019-03-02 DIAGNOSIS — M47812 Spondylosis without myelopathy or radiculopathy, cervical region: Secondary | ICD-10-CM | POA: Diagnosis not present

## 2019-03-02 DIAGNOSIS — I2510 Atherosclerotic heart disease of native coronary artery without angina pectoris: Secondary | ICD-10-CM | POA: Diagnosis not present

## 2019-03-02 DIAGNOSIS — J3489 Other specified disorders of nose and nasal sinuses: Secondary | ICD-10-CM | POA: Diagnosis not present

## 2019-03-02 DIAGNOSIS — I6503 Occlusion and stenosis of bilateral vertebral arteries: Secondary | ICD-10-CM | POA: Diagnosis not present

## 2019-03-02 DIAGNOSIS — R253 Fasciculation: Secondary | ICD-10-CM | POA: Diagnosis present

## 2019-03-02 DIAGNOSIS — R29818 Other symptoms and signs involving the nervous system: Secondary | ICD-10-CM | POA: Diagnosis not present

## 2019-03-02 DIAGNOSIS — R200 Anesthesia of skin: Secondary | ICD-10-CM

## 2019-03-02 DIAGNOSIS — Z87891 Personal history of nicotine dependence: Secondary | ICD-10-CM | POA: Insufficient documentation

## 2019-03-02 DIAGNOSIS — H539 Unspecified visual disturbance: Secondary | ICD-10-CM

## 2019-03-02 DIAGNOSIS — H748X3 Other specified disorders of middle ear and mastoid, bilateral: Secondary | ICD-10-CM | POA: Diagnosis not present

## 2019-03-02 DIAGNOSIS — I6389 Other cerebral infarction: Secondary | ICD-10-CM | POA: Diagnosis not present

## 2019-03-02 DIAGNOSIS — H538 Other visual disturbances: Secondary | ICD-10-CM | POA: Insufficient documentation

## 2019-03-02 DIAGNOSIS — Z8673 Personal history of transient ischemic attack (TIA), and cerebral infarction without residual deficits: Secondary | ICD-10-CM | POA: Diagnosis not present

## 2019-03-02 LAB — DIFFERENTIAL
Abs Immature Granulocytes: 0.02 10*3/uL (ref 0.00–0.07)
Basophils Absolute: 0 10*3/uL (ref 0.0–0.1)
Basophils Relative: 0 %
Eosinophils Absolute: 0.1 10*3/uL (ref 0.0–0.5)
Eosinophils Relative: 1 %
Immature Granulocytes: 0 %
Lymphocytes Relative: 22 %
Lymphs Abs: 1.7 10*3/uL (ref 0.7–4.0)
Monocytes Absolute: 0.5 10*3/uL (ref 0.1–1.0)
Monocytes Relative: 6 %
Neutro Abs: 5.5 10*3/uL (ref 1.7–7.7)
Neutrophils Relative %: 71 %

## 2019-03-02 LAB — URINALYSIS, ROUTINE W REFLEX MICROSCOPIC
Bilirubin Urine: NEGATIVE
Glucose, UA: NEGATIVE mg/dL
Hgb urine dipstick: NEGATIVE
Ketones, ur: NEGATIVE mg/dL
Leukocytes,Ua: NEGATIVE
Nitrite: NEGATIVE
Protein, ur: NEGATIVE mg/dL
Specific Gravity, Urine: 1.033 — ABNORMAL HIGH (ref 1.005–1.030)
pH: 6 (ref 5.0–8.0)

## 2019-03-02 LAB — COMPREHENSIVE METABOLIC PANEL
ALT: 34 U/L (ref 0–44)
AST: 22 U/L (ref 15–41)
Albumin: 4.2 g/dL (ref 3.5–5.0)
Alkaline Phosphatase: 112 U/L (ref 38–126)
Anion gap: 9 (ref 5–15)
BUN: 14 mg/dL (ref 8–23)
CO2: 24 mmol/L (ref 22–32)
Calcium: 9.2 mg/dL (ref 8.9–10.3)
Chloride: 107 mmol/L (ref 98–111)
Creatinine, Ser: 0.91 mg/dL (ref 0.61–1.24)
GFR calc Af Amer: >60 mL/min (ref >60)
GFR calc non Af Amer: >60 mL/min (ref >60)
Glucose, Bld: 109 mg/dL — ABNORMAL HIGH (ref 70–99)
Potassium: 3.9 mmol/L (ref 3.5–5.1)
Sodium: 140 mmol/L (ref 135–145)
Total Bilirubin: 0.9 mg/dL (ref 0.3–1.2)
Total Protein: 7.2 g/dL (ref 6.5–8.1)

## 2019-03-02 LAB — CBC
HCT: 45.2 % (ref 39.0–52.0)
Hemoglobin: 15 g/dL (ref 13.0–17.0)
MCH: 30.7 pg (ref 26.0–34.0)
MCHC: 33.2 g/dL (ref 30.0–36.0)
MCV: 92.4 fL (ref 80.0–100.0)
Platelets: 267 10*3/uL (ref 150–400)
RBC: 4.89 MIL/uL (ref 4.22–5.81)
RDW: 12.8 % (ref 11.5–15.5)
WBC: 7.8 10*3/uL (ref 4.0–10.5)
nRBC: 0 % (ref 0.0–0.2)

## 2019-03-02 LAB — CBG MONITORING, ED: Glucose-Capillary: 103 mg/dL — ABNORMAL HIGH (ref 70–99)

## 2019-03-02 LAB — PROTIME-INR
INR: 1 (ref 0.8–1.2)
Prothrombin Time: 12.8 seconds (ref 11.4–15.2)

## 2019-03-02 LAB — APTT: aPTT: 27 seconds (ref 24–36)

## 2019-03-02 LAB — I-STAT CREATININE, ED: Creatinine, Ser: 0.8 mg/dL (ref 0.61–1.24)

## 2019-03-02 MED ORDER — IOHEXOL 350 MG/ML SOLN
75.0000 mL | Freq: Once | INTRAVENOUS | Status: AC | PRN
  Administered 2019-03-02: 12:00:00 via INTRAVENOUS

## 2019-03-02 NOTE — Discharge Instructions (Addendum)
Your MRI today was normal.  Please take a baby aspirin 81 mg daily for the next 3 weeks in addition to Plavix, then resume Plavix.

## 2019-03-02 NOTE — ED Provider Notes (Signed)
[facility] EMERGENCY DEPARTMENT Provider Note   CSN: [id number] Arrival date & time: 03/02/19  6546     History   Chief Complaint Chief Complaint  Patient presents with   Blurred Vision    HPI Brendan Charles is a 63 y.o. male.     HPI   Patient is a 63 year old male with a past medical history of multiple CVAs with residual left-sided numbness and weakness, hypertension, hepatitis C presenting for visual disturbance "twitching" of eyes, and increased numbness to his left upper and lower extremities.  Patient reports that his last known well was around 4 AM when he got up to use the bathroom and ambulated with a walker.  He did not feel that his vision or gait was off at that time.  Upon awakening approximately 4 to 5 hours later he was experiencing "twitching" of bilateral eyes that produced blurred vision, as well as increased numbness of his entire left upper and lower extremity.  He also feels that the entire left upper and left lower extremity appear weaker than normal.  He denies any aphasia, diplopia, vertigo, facial weakness, or right-sided symptoms.  Denies chest pain, shortness of breath, domino pain, nausea, vomiting, fever or chills.  No history of diabetes.  He lives with his son.  Past Medical History:  Diagnosis Date   Anxiety    Chronic back pain    CVA (cerebral vascular accident) ([place]) 03/20/2017   Hepatitis C    HEP C, never treated, 2007   Hypertension     Patient Active Problem List   Diagnosis Date Noted   Spastic hemiparesis of left dominant side as late effect of cerebral infarction ([place]) 04/19/2017   Gait disturbance, post-stroke 04/18/2017   Adjustment disorder with depressed mood    Cerebral infarction due to thrombosis of right middle cerebral artery ([place]) 04/13/2017   Weakness    Acute ischemic stroke ([place]) 04/12/2017   Stroke (cerebrum) ([place]) 04/12/2017   Acute CVA (cerebrovascular accident) ([place]) 04/12/2017   CVA (cerebral  vascular accident) ([place]) 03/20/2017   Hydronephrosis, left 03/20/2017   Aortic atherosclerosis ([place]) 03/20/2017   Hepatitis C 02/14/2017   Encounter for screening colonoscopy 02/14/2017   Spondylolisthesis at L3-L4 level 06/03/2014    Past Surgical History:  Procedure Laterality Date   CERVICAL DISC SURGERY  2001   anterior   COLONOSCOPY WITH PROPOFOL N/A 03/14/2017   Procedure: COLONOSCOPY WITH PROPOFOL;  Surgeon: Danie Binder, MD;  Location: [facility];  Service: Endoscopy;  Laterality: N/A;  8:15am   CYSTOSCOPY W/ URETERAL STENT PLACEMENT Left 03/23/2017   Procedure: CYSTOSCOPY WITH LEFT RETROGRADE PYELOGRAM/URETERAL LEFT STENT PLACEMENT;  Surgeon: Cleon Gustin, MD;  Location: [facility] ORS;  Service: Urology;  Laterality: Left;   CYSTOSCOPY/RETROGRADE/URETEROSCOPY Left 07/17/2017   Procedure: CYSTOSCOPY/RETROGRADE/URETEROSCOPY, stent removal, stone extraction, stent replacement;  Surgeon: Cleon Gustin, MD;  Location: [facility] ORS;  Service: Urology;  Laterality: Left;   HOLMIUM LASER APPLICATION Left 50/35/4656   Procedure: HOLMIUM LASER APPLICATION;  Surgeon: Cleon Gustin, MD;  Location: [facility] OR[facility];  Service: Urology;  Laterality: Left;   LUMBAR SPINE SURGERY  2015   POLYPECTOMY  03/14/2017   Procedure: POLYPECTOMY;  Surgeon: Danie Binder, MD;  Location: [facility];  Service: Endoscopy;;  rectum   TEE WITHOUT CARDIOVERSION N/A 09/19/2017   Procedure: TRANSESOPHAGEAL ECHOCARDIOGRAM (TEE);  Surgeon: Arnoldo Lenis, MD;  Location: [facility];  Service: Endoscopy;  Laterality: N/A;        Home  Medications    Prior to Admission medications   Medication Sig Start Date End Date Taking? Authorizing Provider  amLODipine (NORVASC) 5 MG tablet Take 5 mg by mouth daily. 10/27/17   [provider]  atorvastatin (LIPITOR) 80 MG tablet Take 1 tablet (80 mg total) by mouth at bedtime. 12/11/17   Drema Dallas, DO  atorvastatin (LIPITOR) 80 MG tablet TAKE 1  TABLET(80 MG) BY MOUTH DAILY 01/28/19   Drema Dallas, DO  clopidogrel (PLAVIX) 75 MG tablet Take 1 tablet (75 mg total) by mouth daily with breakfast. 12/11/17   Drema Dallas, DO  clopidogrel (PLAVIX) 75 MG tablet Take 1 tablet (75 mg total) by mouth daily. 12/22/17   Everlena Cooper, Adam R, DO  escitalopram (LEXAPRO) 10 MG tablet Take 10 mg by mouth daily. 02/16/18   [provider]  losartan (COZAAR) 100 MG tablet Take 100 mg by mouth daily. 10/11/17   [provider]  metoprolol tartrate (LOPRESSOR) 25 MG tablet Take 1 tablet (25 mg total) by mouth 2 (two) times daily. 05/12/17   Angiulli, Mcarthur Rossetti, PA-C  oxyCODONE-acetaminophen (PERCOCET) 7.5-325 MG tablet Take 1 tablet by mouth every 4 (four) hours as needed for severe pain. 07/17/17   McKenzie, Mardene Celeste, MD    Family History Family History  Problem Relation Age of Onset   Cancer Mother    Dementia Sister    Colon cancer Neg Hx     Social History Social History   Tobacco Use   Smoking status: Former Smoker    Packs/day: 0.50    Years: 45.00    Pack years: 22.50    Types: Cigarettes    Quit date: 03/2017    Years since quitting: 1.9   Smokeless tobacco: Never Used   Tobacco comment: pt states quit smoking 1 wk ago  Substance Use Topics   Alcohol use: No   Drug use: No     Allergies   Codeine   Review of Systems Review of Systems  Constitutional: Negative for chills and fever.  HENT: Negative for congestion and sore throat.   Eyes: Positive for visual disturbance. Negative for photophobia and itching.  Respiratory: Negative for cough, chest tightness and shortness of breath.   Cardiovascular: Negative for chest pain, palpitations and leg swelling.  Gastrointestinal: Negative for abdominal pain, nausea and vomiting.  Genitourinary: Negative for dysuria and flank pain.  Musculoskeletal: Negative for back pain and myalgias.  Skin: Negative for rash.  Neurological: Positive for weakness and numbness.  Negative for dizziness, syncope, light-headedness and headaches.     Physical Exam Updated Vital Signs BP (!) 149/103    Pulse 70    Temp (!) 97.2 F (36.2 C) (Oral)    Resp 16    Ht  (1.753 m)    Wt 65.8 kg    SpO2 98%    BMI 21.41 kg/m   Physical Exam Vitals signs and nursing note reviewed.  Constitutional:      General: He is not in acute distress.    Appearance: He is well-developed. He is not ill-appearing or diaphoretic.  HENT:     Head: Normocephalic and atraumatic.     Mouth/Throat:     Mouth: Mucous membranes are moist.  Eyes:     Conjunctiva/sclera: Conjunctivae normal.     Pupils: Pupils are equal, round, and reactive to light.  Neck:     Musculoskeletal: Normal range of motion and neck supple.  Cardiovascular:     Rate and  Rhythm: Normal rate and regular rhythm.     Heart sounds: S1 normal and S2 normal. No murmur.  Pulmonary:     Effort: Pulmonary effort is normal.     Breath sounds: Normal breath sounds. No wheezing or rales.  Abdominal:     General: There is no distension.     Palpations: Abdomen is soft.     Tenderness: There is no abdominal tenderness. There is no guarding.  Musculoskeletal: Normal range of motion.        General: No deformity.     Comments: Spasticity and slight contracture of LLE.  Lymphadenopathy:     Cervical: No cervical adenopathy.  Skin:    General: Skin is warm and dry.     Findings: No erythema or rash.  Neurological:     Mental Status: He is alert.     Comments: Mental Status:  Alert, oriented, thought content appropriate, able to give a coherent history. Speech fluent without evidence of aphasia. Able to follow 2 step commands without difficulty.  Cranial Nerves:  II:  Peripheral visual fields intact to confrontation, pupils equal, round, reactive to light III,IV, VI: ptosis not present, extra-ocular motions intact bilaterally. No nystagmus appreciated.   V,VII: smile symmetric, facial light touch sensation  equal VIII: hearing grossly normal to voice  X: uvula elevates symmetrically  XI: bilateral shoulder shrug symmetric and strong XII: midline tongue extension without fassiculations Motor:  Normal tone. 5/5 in right upper and lower extremities including strong and equal grip strength and dorsiflexion/plantar flexion. Strength 4+/5 in left upper and lower extremities throughout.  Sensory: Light touch reportedly decreased in left upper and lower extremities.  Deep Tendon Reflexes: 3+ bilaterally. Right sided clonus. Cerebellar: Dysmetria with finger to nose on left. Normal finger to nose exam on left. Stance: No pronator drift and good coordination, strength, and position sense with tapping of bilateral arms (performed in sitting position). CV: distal pulses palpable throughout    Psychiatric:        Behavior: Behavior normal.        Thought Content: Thought content normal.        Judgment: Judgment normal.     Large Artery Stroke Screening     Weakness:  Mild (minor drift)  Vision:  NONE  Aphasia:  NONE  Neglect:  NONE:  VAN =  Negative  If patient has any weakness PLUS any one of the below: Visual Disturbance (field cut, double, or blind vision) Aphasia (inability to speak or understand) Neglect (gaze to one side or ignoring one side) This is likely a large artery clot (cortical symptoms) = VAN Positive   ED Treatments / Results  Labs (all labs ordered are listed, but only abnormal results are displayed) Labs Reviewed  COMPREHENSIVE METABOLIC PANEL - Abnormal; Notable for the following components:      Result Value   Glucose, Bld 109 (*)    All other components within normal limits  CBG MONITORING, ED - Abnormal; Notable for the following components:   Glucose-Capillary 103 (*)    All other components within normal limits  PROTIME-INR  APTT  CBC  DIFFERENTIAL  URINALYSIS, ROUTINE W REFLEX MICROSCOPIC  I-STAT CREATININE, ED    EKG EKG  Interpretation  Date/Time:  Saturday March 02 2019 10:09:14 EDT Ventricular Rate:  65 PR Interval:    QRS Duration: 99 QT Interval:  408 QTC Calculation: 425 R Axis:   43 Text Interpretation:  Sinus rhythm Confirmed by Donnetta Hutchingook, Brian ([id number]) on 03/02/2019 10:28:42  AM   Radiology Ct Angio Head W Or Wo Contrast  Result Date: 03/02/2019 CLINICAL DATA:  Blurred vision beginning this morning EXAM: CT ANGIOGRAPHY HEAD AND NECK TECHNIQUE: Multidetector CT imaging of the head and neck was performed using the standard protocol during bolus administration of intravenous contrast. Multiplanar CT image reconstructions and MIPs were obtained to evaluate the vascular anatomy. Carotid stenosis measurements (when applicable) are obtained utilizing NASCET criteria, using the distal internal carotid diameter as the denominator. CONTRAST:  Reference EMR COMPARISON:  03/02/2018 brain MRI FINDINGS: CT HEAD FINDINGS Brain: Remote small vessel infarcts in the bilateral basal ganglia and deep white matter, more extensive on the left. No evidence of acute infarction, hemorrhage, hydrocephalus, or collection. No masslike finding Vascular: See below Skull: Negative Sinuses: Essentially clear Orbits: Negative Review of the MIP images confirms the above findings CTA NECK FINDINGS Aortic arch: Atheromatous wall thickening.  3 vessel branching Right carotid system: Mild atherosclerotic plaque at the bulb without stenosis or ulceration. Left carotid system: Mild atherosclerotic plaque at the bulb without stenosis or ulceration. Vertebral arteries: Proximal subclavian atherosclerosis without flow limiting stenosis. High-grade narrowing at the origin of the left vertebral artery. No beading or dissection. Skeleton: Advanced C3-4 disc degeneration with biforaminal impingement. C5-6 and C6-7 ACDF with solid arthrodesis. Other neck: Small and incidental right thyroid nodule. Upper chest: Negative Review of the MIP images confirms the above  findings CTA HEAD FINDINGS Anterior circulation: Atherosclerotic plaque on the carotid siphons. No branch occlusion or flow limiting stenosis. Negative for aneurysm. Posterior circulation: Severe atheromatous type narrowing at the mid right V4 segment. The left vertebral and basilar artery are widely patent. No branch occlusion or aneurysm. Venous sinuses: Distal left transverse and sigmoid sinuses are asymmetrically non-opacified but not hyperdense on precontrast, likely related to non dominance accentuated by arterial phase imaging Anatomic variants: None significant Delayed phase: Not obtained Review of the MIP images confirms the above findings IMPRESSION: 1. No emergent finding. 2. Chronic small vessel ischemia with remote small vessel infarcts. 3. Atherosclerosis with high-grade left vertebral origin and right V4 segment stenoses Electronically Signed   By: Marnee Spring M.D.   On: 03/02/2019 13:05   Ct Angio Neck W And/or Wo Contrast  Result Date: 03/02/2019 CLINICAL DATA:  Blurred vision beginning this morning EXAM: CT ANGIOGRAPHY HEAD AND NECK TECHNIQUE: Multidetector CT imaging of the head and neck was performed using the standard protocol during bolus administration of intravenous contrast. Multiplanar CT image reconstructions and MIPs were obtained to evaluate the vascular anatomy. Carotid stenosis measurements (when applicable) are obtained utilizing NASCET criteria, using the distal internal carotid diameter as the denominator. CONTRAST:  Reference EMR COMPARISON:  03/02/2018 brain MRI FINDINGS: CT HEAD FINDINGS Brain: Remote small vessel infarcts in the bilateral basal ganglia and deep white matter, more extensive on the left. No evidence of acute infarction, hemorrhage, hydrocephalus, or collection. No masslike finding Vascular: See below Skull: Negative Sinuses: Essentially clear Orbits: Negative Review of the MIP images confirms the above findings CTA NECK FINDINGS Aortic arch: Atheromatous  wall thickening.  3 vessel branching Right carotid system: Mild atherosclerotic plaque at the bulb without stenosis or ulceration. Left carotid system: Mild atherosclerotic plaque at the bulb without stenosis or ulceration. Vertebral arteries: Proximal subclavian atherosclerosis without flow limiting stenosis. High-grade narrowing at the origin of the left vertebral artery. No beading or dissection. Skeleton: Advanced C3-4 disc degeneration with biforaminal impingement. C5-6 and C6-7 ACDF with solid arthrodesis. Other neck: Small and incidental right thyroid  nodule. Upper chest: Negative Review of the MIP images confirms the above findings CTA HEAD FINDINGS Anterior circulation: Atherosclerotic plaque on the carotid siphons. No branch occlusion or flow limiting stenosis. Negative for aneurysm. Posterior circulation: Severe atheromatous type narrowing at the mid right V4 segment. The left vertebral and basilar artery are widely patent. No branch occlusion or aneurysm. Venous sinuses: Distal left transverse and sigmoid sinuses are asymmetrically non-opacified but not hyperdense on precontrast, likely related to non dominance accentuated by arterial phase imaging Anatomic variants: None significant Delayed phase: Not obtained Review of the MIP images confirms the above findings IMPRESSION: 1. No emergent finding. 2. Chronic small vessel ischemia with remote small vessel infarcts. 3. Atherosclerosis with high-grade left vertebral origin and right V4 segment stenoses Electronically Signed   By: Marnee SpringJonathon  Watts M.D.   On: 03/02/2019 13:05    Procedures Procedures (including critical care time)  Medications Ordered in ED Medications  iohexol (OMNIPAQUE) 350 MG/ML injection 75 mL ( Intravenous Contrast Given 03/02/19 1216)     Initial Impression / Assessment and Plan / ED Course  I have reviewed the triage vital signs and the nursing notes.  Pertinent labs & imaging results that were available during my care  of the patient were reviewed by me and considered in my medical decision making (see chart for details).  Clinical Course as of Mar 02 1427  Sat Mar 02, 2019  1043 LNW 4am when pt got up to the bathroom.   [AM]  1334 Attempted to call son x 2 for collateral information.   [AM]  1340 Collateral information obtained from patient's son, Candace GallusJoseph Paule who confirms the same history of the patient reported.  He specifically denies any altered speech, facial weakness, or increasing unilateral weakness or neglect this morning. Reports that the patient was "generally weak."    [AM]  1411 Spoke with Dr. Laurence SlateAroor of neurology who states that patient should get MRI today. Re[facility] ED [facility] Will proceed with ED to ED transfer and contact [facility].    [AM]  1414 Spoke with Dr. Anitra LauthPlunkett who is accepting for ED to ED transfer.    [AM]    Clinical Course User Index [AM] Elisha PonderMurray, Madison Direnzo B, PA-C       This is a [age] year old male with past medical history multiple CVA secondary to chronic small vessel disease, with left-sided residual weakness and numbness, hepatitis C, hypertension, presenting for bilateral visual disturbance described as "twitching" of eyes, and increased left-sided numbness/weakness from baseline.  Patient specifically denies diplopia or vertigo sensation, and the visual disturbance is symmetric with isolation of each eye.  Patient presented greater than 6 hours after the onset of symptoms and is not meeting criteria for code stroke.  Differential diagnosis includes ICH, acute CVA, optic nerve lesion, peripheral neuropathy.  He is not hyper or hypoglycemic on arrival.  Examination not consistent with LVO stroke.  Will obtain CT head, CT angiograms, anticipate admission for further work-up. Case discussed with Dr. Donnetta HutchingBrian Cook and patient independently evaluated by this physician.   Work-up demonstrating unremarkable CBC and CMP.  EKG normal sinus rhythm without  evidence of acute ischemia, infarction, or arrhythmia, reviewed by me.  CT angiogram head and neck demonstrating high grade stenosis of bilateral vertebral arteries particularly of the right V4 segment.  No acute findings.  No ICH.  Vital signs remained stable.  Patient is been slightly hypertensive, but antihypertensives held due to work-up for CVA and allowing  for permissive hypertension.  Per the recommendations of Dr. Laurence SlateAroor of neurology, patient to be transferred for ED to ED transfer [facility]. Disposition pending MRI and further neurology recommendations.   This is a shared visit with Dr. Donnetta HutchingBrian Cook. Patient was independently evaluated by this attending physician. Attending physician consulted in evaluation and transfer management.  Final Clinical Impressions(s) / ED Diagnoses   Final diagnoses:  Visual disturbance  Numbness    ED Discharge Orders    None       Delia ChimesMurray, Jazzlynn Rawe B, PA-C 03/02/19 1428    Donnetta Hutchingook, Brian, MD 03/03/19 [phone]

## 2019-03-02 NOTE — ED Notes (Signed)
MRI made aware that pt is here and is not claustrophobic. Pt has no complaints other than blurred vision right now. Axox4.

## 2019-03-02 NOTE — ED Notes (Signed)
Pt taken to MRI by this RN.  

## 2019-03-02 NOTE — ED Provider Notes (Signed)
Patient here transferring from any pain, reported to the ED around 10 AM this morning, with complaints of dizziness upon waking up, twitching of the eyes, increased numbness to his left upper and lower extremities.  Does have a previous history of multiple CVAs with residual left-sided numbness.  No dysphasia, diplopia, vertigo, facial weakness or right-sided symptoms.  Patient arrived in the ED blood pressure stable, was noted to be slightly hypertensive upon leaving any pain, arrived here today with a pressure of 134/102, according to prior documentation we will treat hypertension and allow permissive hypertension.  We will follow-up with MRI results.  Prior colleague Valere Dross PA has updated son Niel Peretti at phone [phone]    Physical Exam  BP (!) 134/102 (BP Location: Left Arm)   Pulse 68   Temp 97.7 F (36.5 C) (Oral)   Resp 16   Ht 5\' 9"  (1.753 m)   Wt 65.8 kg   SpO2 98%   BMI 21.41 kg/m   Physical Exam  ED Course/Procedures   Clinical Course as of Mar 01 2130  Sat Mar 02, 2019  1043 LNW 4am when pt got up to the bathroom.   [AM]  1334 Attempted to call son x 2 for collateral information.   [AM]  1340 Collateral information obtained from patient's son, Dvon Jiles who confirms the same history of the patient reported.  He specifically denies any altered speech, facial weakness, or increasing unilateral weakness or neglect this morning. Reports that the patient was "generally weak."    [AM]  4332 RJJOA with Dr. Lorraine Lax of neurology who states that patient should get MRI today. Recommends ED to ED transfer to obtain MRI at [facility]. Will proceed with ED to ED transfer and contact [facility].    [AM]  [place] Spoke with Dr. Maryan Rued who is accepting for ED to ED transfer.    [AM]    Clinical Course User Index [AM] Albesa Seen, PA-C    Procedures  MDM   Patient send for MRI brain from [facility]. MRI ordered.  MRI Brain showed:  1. Stable chronic disease.  2. Remote  lacunar infarcts of the basal ganglia bilaterally.  3. Extensive remote white matter infarct involving the left corona  radiata and centrum semi ovale  4. Remote infarct of the anterior right medulla.  5. Bilateral mastoid effusions. No obstructing nasopharyngeal lesion  is present.        9:09 PM Spoke to Dr. Leonel Ramsay, neurologist who reviewed patient's imaging and recommended a baby aspirin for the next 3 weeks in addition to his Plavix.  Of note, patient was recently seen for paroxysmal benign vertigo 4 days ago.  Will have patient follow-up with neurology for further management.  Patient is currently asymptomatic, reports dizziness has improved, I have attempted to call his son x 6 without success.  We will continue to try son for a ride.  Patient ultimately stable for discharge.  Portions of this note were generated with [employer]. Dictation errors may occur despite best attempts at proofreading.      Janeece Fitting, PA-C 03/02/19 2131    Sherwood Gambler, MD 03/03/19 [phone]

## 2019-03-02 NOTE — ED Notes (Signed)
Spoke with son.  He is coming from [place], he will be here in about one hour.

## 2019-03-02 NOTE — ED Triage Notes (Signed)
Pt reports he woke up this morning with blurred vision. Initially c/o left sided numbness but on further discussion, pt reports this is normal for him from a past stroke along with some residual left sided weakness.

## 2019-03-03 NOTE — ED Notes (Signed)
Patient verbalizes understanding of discharge instructions. Opportunity for questioning and answers were provided. Armband removed by staff, pt discharged from ED.  

## 2019-03-07 ENCOUNTER — Other Ambulatory Visit: Payer: Self-pay | Admitting: *Deleted

## 2019-03-07 ENCOUNTER — Other Ambulatory Visit: Payer: Self-pay

## 2019-03-07 ENCOUNTER — Encounter: Payer: Self-pay | Admitting: *Deleted

## 2019-03-07 NOTE — Patient Outreach (Signed)
  [facility]) Care Management  03/07/2019  AKSHAT MINEHART 05/18/1956 [id number]   Telephone Screen  Referral Date: 03/06/19 High Referral Source: [facility] referral  Referral Reason: Hx of multiple CVAs with residual left side numbness. Recent ED visit-woke with vision disturbance and increased numbness to left side.  Was told to take 81 mg aspirin for 3 weeks in addition to his plavix. Has not started. Has no pcp or neuro follow up (that I can see in hub) States his son will pick up 81 mg aspirin for him A Zenaida Niece 03/05/19  Insurance: [employer] medicare  No admission in the last 6 months   Outreach attempt # 1 No answer. [facility] RN CM left HIPAA compliant voicemail message along with CM's contact info.   Plan: [facility] RN CM sent an unsuccessful outreach letter and scheduled this patient for another call attempt within 4 business days  Kimberly L. Lavina Hamman, RN, BSN, [place] Coordinator Office number [phone] Mobile number [phone]  Main [facility] number [phone] Fax number [phone]

## 2019-03-07 NOTE — Patient Outreach (Signed)
  [place] [facility]) Care Management  03/07/2019  Brendan Charles 12-Mar-1956 [id number]   Telephone Screen  Referral Date: 03/06/19 High Referral Source: [facility] UM referral  Referral Reason: Hx of multiple CVAs with residual left side numbness. Recent ED visit-woke with vision disturbance and increased numbness to left side.  Was told to take 81 mg aspirin for 3 weeks in addition to his plavix. Has not started. Has no pcp or neuro follow up (that I can see in hub) States his son will pick up 81 mg aspirin for him A Zenaida Niece 03/05/19  Insurance: united health care medicare  No admission in the last 6 months    Patient returned a call to [place] CM Cm received his voice message left at 1557 03/07/19  CM returned the calll   Outreach attempt # 1 A No answer. [facility] RN CM left HIPAA compliant voicemail message along with CM's contact info.   Plan: [facility] RN CM sent an unsuccessful outreach letter and scheduled this patient for another call attempt within 4 business days  Kimberly L. Lavina Hamman, RN, BSN, [place] Coordinator Office number [phone] Mobile number [phone]  Main [facility] number [phone] Fax number [phone]

## 2019-03-07 NOTE — Patient Outreach (Signed)
[facility]) Care Management  03/07/2019  Brendan GreenhouseDouglas B Charles 01-Aug-1956 [id number]   Telephone Screen  Referral Date: 03/06/19 High [facility]  Referral Reason: Hx of multiple CVAs with residual left side numbness. Recent ED visit-woke with vision disturbance and increased numbness to left side.  Was told to take 81 mg aspirin for 3 weeks in addition to his Plavix. Has not started. Has no pcp or neuro follow up (that I can see in hub) States his son will pick up 81 mg aspirin for him  Brendan Charles 03/05/19  Insurance: united health care medicare  No admission in the last 6 months Last 04/13/2017 to 05/12/2017 hemiplegia aster CVA ED visit on 7/25/20with vision disturbance and increased numbness to left side   Outreach attempt # 1 No answer. [facility] RN CM left HIPAA compliant voicemail message along with CM's contact info.   Patient [facility] CM within 2-3 minutes Successful contact  Patient is able to verify HIPAA Reviewed and addressed [facility]  He is noted with speech difficulty but is understandable once encouraged to slow speech down He informs [facility] CM his son obtained his 81 mg ASA and he is taking it as ordered He confirms his primary care MD as Brendan Charles and his neurologist as Brendan Charles Each are noted to be listed in Epic   Today he denies visual concerns. He denies medical concerns since las ED visit  Today he denies depression or a loss of interest  [facility] RN CM reviewed services of [facility] SW but [facility] the [facility]  Today he denies contact with covid + person nor s/s of covid He reports having PPE, washing of hands and staying 6 feet from others  Social: [name] is divorced, disabled and lives with his son, Brendan Charles. 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, TIAs, HTN, calculus of kidney,  spondylolisthesis at L3-L4 level, left hydronephrosis adjustment disorder with depressed mood, HLD, aortic atherosclerosis, hepatitis, hx of falls, smoker  Fall He reports a fall on the day he arrived home from the ED He reports falling from his steps He denies injury to his head He states he had a bruise on his arm only    DME: walker , cane, BP monitor He has used [facility] in the past for services and DME   Medications: He informs [facility] CM his son obtained his 81 mg ASA and he is taking it as ordered He denies concerns with taking medications as prescribed, affording medications, side effects of medications and questions about medications Does not obtain the flu shot, no records found   Appointments: 02/26/20 Brendan Charles one year f/u seen last on 02/26/19 Was going to vestibular rehab for BPPV, resolved and visits d/c r/t covid During the 02/26/19 visit Brendan Charles advised him to contact PT to see if vestibular rehab services are reopened.  He is to contact MD if they need new referral  Advance Directives: He reports his son, Brendan Charles is his POA  Consent: [facility] RN CM [facility] with patient. Patient gave verbal consent for services.  [facility] CM will close case at this time as patient has been assessed and no needs identified/needs resolved.   Pt encouraged to return a call to [facility]  RN CM prn  [facility] RN CM discussed the mailed successful outreach letter with [facility] brochure enclosed for review.   Brendan Charles L. Lavina Hamman, RN, BSN, [place] Coordinator Office number [phone] Mobile number [phone]  Main [facility] number [phone] Fax number [phone]

## 2019-03-08 ENCOUNTER — Ambulatory Visit: Payer: Self-pay | Admitting: *Deleted

## 2019-03-12 DIAGNOSIS — G894 Chronic pain syndrome: Secondary | ICD-10-CM | POA: Diagnosis not present

## 2019-04-22 DIAGNOSIS — G894 Chronic pain syndrome: Secondary | ICD-10-CM | POA: Diagnosis not present

## 2019-05-15 DIAGNOSIS — G894 Chronic pain syndrome: Secondary | ICD-10-CM | POA: Diagnosis not present

## 2019-05-21 ENCOUNTER — Other Ambulatory Visit: Payer: Self-pay

## 2019-05-21 NOTE — Telephone Encounter (Signed)
These medications should be refilled by his PCP

## 2019-06-08 DIAGNOSIS — I10 Essential (primary) hypertension: Secondary | ICD-10-CM | POA: Diagnosis not present

## 2019-06-08 DIAGNOSIS — E782 Mixed hyperlipidemia: Secondary | ICD-10-CM | POA: Diagnosis not present

## 2019-06-18 DIAGNOSIS — G894 Chronic pain syndrome: Secondary | ICD-10-CM | POA: Diagnosis not present

## 2019-07-18 DIAGNOSIS — M5136 Other intervertebral disc degeneration, lumbar region: Secondary | ICD-10-CM | POA: Diagnosis not present

## 2019-07-18 DIAGNOSIS — R52 Pain, unspecified: Secondary | ICD-10-CM | POA: Diagnosis not present

## 2019-08-08 DIAGNOSIS — I10 Essential (primary) hypertension: Secondary | ICD-10-CM | POA: Diagnosis not present

## 2019-08-08 DIAGNOSIS — G894 Chronic pain syndrome: Secondary | ICD-10-CM | POA: Diagnosis not present

## 2020-02-24 NOTE — Progress Notes (Deleted)
NEUROLOGY FOLLOW UP OFFICE NOTE  Brendan Charles [id number]  HISTORY OF PRESENT ILLNESS: Brendan Charles a 64 year old right-handed male with tobacco use, hypertension and history of prior strokes and hepatitis C who follows up for stroke and recurrent TIAs. He is accompanied by his son who supplements history.  UPDATE: Current medications include Plavix, losartan, metoprololand atorvastatin 80 mg daily.  Last year, on 03/02/2019, he was seen in the ED for increased dizziness and numbness of left upper and lower extremities upon awakening.  MRI of brain personally reviewed showed stable chronic small vessel disease with remote lacunar infarcts in the basal ganglia bilaterally and remote infarct in right medulla but no acute intracranial abnormality.  He was diagnosed with TIA and discharged on dual antiplatelet therapy for 3 weeks followed by Plavix alone.  HISTORY: He was admitted to [facility] from 03/20/17 to 03/21/17 for stroke. He underwent colonscopy a week prior to admission and developed slurred speech. A few days later, he developed right sided weakness and speech difficulty but did not seek immediate medical attention. The following day, he developed left lower quadrant pain and then went to the ED. MRI of brain revealed acute left basal ganglia ischemic infarct extending towards the corona radiata with associated high-grade stenosis or occlusion of the anterior inferior left M2 segment on MRA. It also revealed moderate to high-grade stenosis of the proximal right vertebral artery with distal flow beyond the PICA as well as 1.5 mm aneurysm or more likely infundibulum of the left PCOM artery. Carotid doppler revealed small plaque in left ICA but no hemodynamically significant ICA stenosis bilaterally. TTE showed EF 60-65% with no atrial septal defect/PFO or other cardiac source of emboli. LDL was 89, Hgb A1c 5.5, HIV antibody negative, RPR negative, TSH 2.892, and B12  318. He was started on ASA 325mg  and Plavix 75mg  and atorvastatin 80mg .   He was readmitted to [facility] from 04/12/17 to 04/13/17 for sudden onset left upper extremity numbness and weakness. Due to recent stroke, he was not tPA candidate. MRI of brain revealed acute right medullary lacunar ischemic infarct. CTA of head and neck again revealed severe tandem right V4 vertebral artery stenosis proximal to PICA origin but with patent carotid arteries and intracranial vasculature. He was continued on dual antiplatelet therapy with Lipitor 80mg . He was discharged to rehab for 1 week. He again presented to the ED on 05/18/17 after waking up in the morning with new left sided numbness and weakness. CT of head showed chronic lacunar infarcts but nothing acute. MRI of brain showed expected evolution of his recent left basal ganglia/corona radiata and right medullary infarcts but no acute findings. MRA of head was again notable for unchanged severe tandem right V4 vertebral artery stenosis.  TEE from 09/19/17 demonstratee LV EF 55-60% with no PFO or atrial septal defect or evidence of intracardiac thrombus. It did reveal questionable semi-mobile plaque in the proximal ascending aorta. Follow up CTA of chest from 12/22/17 showed no abnormality to correspond with finding on the TEE.LDL from 08/28/17 was 68.  In July 2019, he endorsed 1 to 2 months ofworseningleft-sided numbness. MRI of the brain from 03/02/2018 was personally reviewed and again demonstrated advanced chronic small vessel ischemic changes but no acute infarct. 24-hour Holter monitor was again ordered but not performed.  PAST MEDICAL HISTORY: Past Medical History:  Diagnosis Date  . Anxiety   . Chronic back pain   . CVA (cerebral vascular accident) (HCC) 03/20/2017  . Hepatitis C  HEP C, never treated, 2007  . Hypertension     MEDICATIONS: Current Outpatient Medications on File Prior to Visit  Medication Sig Dispense Refill  .  amLODipine (NORVASC) 5 MG tablet Take 5 mg by mouth daily.  11  . aspirin EC 81 MG tablet Take 81 mg by mouth daily.    Marland Kitchen atorvastatin (LIPITOR) 80 MG tablet Take 1 tablet (80 mg total) by mouth at bedtime. 30 tablet 5  . atorvastatin (LIPITOR) 80 MG tablet TAKE 1 TABLET(80 MG) BY MOUTH DAILY 90 tablet 3  . clopidogrel (PLAVIX) 75 MG tablet Take 1 tablet (75 mg total) by mouth daily with breakfast. 30 tablet 5  . escitalopram (LEXAPRO) 10 MG tablet Take 10 mg by mouth daily.  11  . losartan (COZAAR) 100 MG tablet Take 100 mg by mouth daily.  11  . metoprolol tartrate (LOPRESSOR) 25 MG tablet Take 1 tablet (25 mg total) by mouth 2 (two) times daily. 60 tablet 0  . oxyCODONE-acetaminophen (PERCOCET) 7.5-325 MG tablet Take 1 tablet by mouth every 4 (four) hours as needed for severe pain. 30 tablet 0   No current facility-administered medications on file prior to visit.    ALLERGIES: Allergies  Allergen Reactions  . Codeine Itching and Nausea Only    FAMILY HISTORY: Family History  Problem Relation Age of Onset  . Cancer Mother   . Dementia Sister   . Colon cancer Neg Hx     SOCIAL HISTORY: Social History   Socioeconomic History  . Marital status: Divorced    Spouse name: Not on file  . Number of children: Not on file  . Years of education: Not on file  . Highest education level: Associate degree: academic program  Occupational History  . Occupation: retired    Comment: former Naval architect  Tobacco Use  . Smoking status: Former Smoker    Packs/day: 0.50    Years: 45.00    Pack years: 22.50    Types: Cigarettes    Quit date: 03/2017    Years since quitting: 2.9  . Smokeless tobacco: Never Used  . Tobacco comment: pt states quit smoking 1 wk ago  Vaping Use  . Vaping Use: Never used  Substance and Sexual Activity  . Alcohol use: No  . Drug use: No  . Sexual activity: Not Currently    Birth control/protection: None  Other Topics Concern  . Not on file  Social  History Narrative   Divorced, lives with son in a 2 story home, though Pt stays in 1st level. He drinks one cup of coffee and one soda a day. Is to begin O/P P/T next week.   Social Determinants of Health   Financial Resource Strain:   . Difficulty of Paying Living Expenses:   Food Insecurity:   . Worried About Programme researcher, broadcasting/film/video in the Last Year:   . Barista in the Last Year:   Transportation Needs:   . Freight forwarder (Medical):   Marland Kitchen Lack of Transportation (Non-Medical):   Physical Activity:   . Days of Exercise per Week:   . Minutes of Exercise per Session:   Stress:   . Feeling of Stress :   Social Connections:   . Frequency of Communication with Friends and Family:   . Frequency of Social Gatherings with Friends and Family:   . Attends Religious Services:   . Active Member of Clubs or Organizations:   . Attends Banker Meetings:   .  Marital Status:   Intimate Partner Violence:   . Fear of Current or Ex-Partner:   . Emotionally Abused:   Marland Kitchen Physically Abused:   . Sexually Abused:     PHYSICAL EXAM: *** General: No acute distress.  Patient appears well-groomed.   Head:  Normocephalic/atraumatic Eyes:  Fundi examined but not visualized Neck: supple, no paraspinal tenderness, full range of motion Heart:  Regular rate and rhythm Lungs:  Clear to auscultation bilaterally Back: No paraspinal tenderness Neurological Exam: alert and oriented to person, place, and time. Attention span and concentration intact, recent and remote memory intact, fund of knowledge intact.  Speech fluent and not dysarthric, language intact.  Mild left lower facial weakness.  Otherwise, CN II-XII intact. Mild increased tone on left, muscle strength 4+/5 left upper and lower extremities, 5/5 on right.  Sensation to light touch intact.  Deep tendon reflexes 3+ left upper and lower extremities and 2+ right upper and lower extremities.  Finger to nose testing intact.  Ambulates  ***.  IMPRESSION: 1.  Multiple strokes and TIAs secondary to small vessel disease 2.  Residual left sided numbness and weakness secondary to stroke 3.  Hypertension 4.  Hyperlipidemia 5.  Former tobacco user (quit 2018)  PLAN: Secondary stroke prevention as managed by PCP:  - Plavix 75mg  daily  - Atorvastatin 80mg  daily (LDL goal less than 70  - Blood pressure control  - Glycemic control (Hgb A1c goal less than 7)  , DO  CC:  , MD

## 2020-02-26 ENCOUNTER — Ambulatory Visit: Payer: Medicare Other | Admitting: Neurology

## 2020-03-02 ENCOUNTER — Other Ambulatory Visit: Payer: Self-pay | Admitting: Neurology

## 2020-03-02 NOTE — Telephone Encounter (Signed)
[facility] called to get a refill of the patient's atorvastatin.

## 2020-03-03 NOTE — Telephone Encounter (Signed)
Pharmacy called and notified that atorvastatin should be refilled by PCP.

## 2020-03-06 ENCOUNTER — Other Ambulatory Visit: Payer: Self-pay

## 2020-03-06 ENCOUNTER — Other Ambulatory Visit: Payer: Self-pay | Admitting: Neurology

## 2020-03-06 MED ORDER — ATORVASTATIN CALCIUM 80 MG PO TABS: 80.0000 mg | ORAL_TABLET | Freq: Every evening | ORAL | 4 refills | Status: DC

## 2020-03-06 NOTE — Telephone Encounter (Signed)
[facility] called to ask a prescription for Atorvastatin 80mg  be sent to them for patient.

## 2020-03-06 NOTE — Telephone Encounter (Signed)
[facility], Will call [facility] to cancel script there.

## 2020-07-08 NOTE — Progress Notes (Signed)
NEUROLOGY FOLLOW UP OFFICE NOTE  PJ ZEHNER [id number]   Subjective:  Brendan Charles a 64 year old right-handed male with tobacco use, hypertension and history of prior strokes and hepatitis C who follows up for stroke and TIAs. He is accompanied by his son who supplements history.  UPDATE: Current medications include Plavix, losartan, metoprololand atorvastatin 80 mg daily.  Last seen on 02/26/2019.  He went to the ED on 03/02/2019 after waking up with twitching of his eyes, blurred vision and increased left upper and lower extremity numbness.  MRI of brain personally reviewed showed no acute stroke or other acute intracranial abnormalities.  CTA of head and neck personally reviewed showed atherosclerosis with known high-grade left vertebral origin and right V4 segment stenoses but no emergent findings.  No new issues.  He is mostly sedentary.  May use the walker to go to the bathroom once a day.    HISTORY: He was admitted to [facility] from 03/20/17 to 03/21/17 for stroke. He underwent colonscopy a week prior to admission and developed slurred speech. A few days later, he developed right sided weakness and speech difficulty but did not seek immediate medical attention. The following day, he developed left lower quadrant pain and then went to the ED. MRI of brain revealed acute left basal ganglia ischemic infarct extending towards the corona radiata with associated high-grade stenosis or occlusion of the anterior inferior left M2 segment on MRA. It also revealed moderate to high-grade stenosis of the proximal right vertebral artery with distal flow beyond the PICA as well as 1.5 mm aneurysm or more likely infundibulum of the left PCOM artery. Carotid doppler revealed small plaque in left ICA but no hemodynamically significant ICA stenosis bilaterally. TTE showed EF 60-65% with no atrial septal defect/PFO or other cardiac source of emboli. LDL was 89, Hgb A1c 5.5, HIV  antibody negative, RPR negative, TSH 2.892, and B12 318. He was started on ASA 325mg  and Plavix 75mg  and atorvastatin 80mg .   He was readmitted to [facility] from 04/12/17 to 04/13/17 for sudden onset left upper extremity numbness and weakness. Due to recent stroke, he was not tPA candidate. MRI of brain revealed acute right medullary lacunar ischemic infarct. CTA of head and neck again revealed severe tandem right V4 vertebral artery stenosis proximal to PICA origin but with patent carotid arteries and intracranial vasculature. He was continued on dual antiplatelet therapy with Lipitor 80mg . He was discharged to rehab for 1 week. He again presented to the ED on 05/18/17 after waking up in the morning with new left sided numbness and weakness. CT of head showed chronic lacunar infarcts but nothing acute. MRI of brain showed expected evolution of his recent left basal ganglia/corona radiata and right medullary infarcts but no acute findings. MRA of head was again notable for unchanged severe tandem right V4 vertebral artery stenosis.  TEE from 09/19/17 demonstratee LV EF 55-60% with no PFO or atrial septal defect or evidence of intracardiac thrombus. It did reveal questionable semi-mobile plaque in the proximal ascending aorta. Follow up CTA of chest from 12/22/17 showed no abnormality to correspond with finding on the TEE.LDL from 08/28/17 was 68.  In July 2019, he endorsed 1 to 2 months ofworseningleft-sided numbness. MRI of the brain from 03/02/2018 was personally reviewed and again demonstrated advanced chronic small vessel ischemic changes but no acute infarct. 24-hour Holter monitor was again ordered but not performed.  PAST MEDICAL HISTORY: Past Medical History:  Diagnosis Date  . Anxiety   .  Chronic back pain   . CVA (cerebral vascular accident) (HCC) 03/20/2017  . Hepatitis C    HEP C, never treated, 2007  . Hypertension     MEDICATIONS: Current Outpatient Medications on File  Prior to Visit  Medication Sig Dispense Refill  . amLODipine (NORVASC) 5 MG tablet Take 5 mg by mouth daily.  11  . aspirin EC 81 MG tablet Take 81 mg by mouth daily.    Marland Kitchen atorvastatin (LIPITOR) 80 MG tablet Take 1 tablet (80 mg total) by mouth every evening. 30 tablet 4  . clopidogrel (PLAVIX) 75 MG tablet Take 1 tablet (75 mg total) by mouth daily with breakfast. 30 tablet 5  . escitalopram (LEXAPRO) 10 MG tablet Take 10 mg by mouth daily.  11  . losartan (COZAAR) 100 MG tablet Take 100 mg by mouth daily.  11  . metoprolol tartrate (LOPRESSOR) 25 MG tablet Take 1 tablet (25 mg total) by mouth 2 (two) times daily. 60 tablet 0  . oxyCODONE-acetaminophen (PERCOCET) 7.5-325 MG tablet Take 1 tablet by mouth every 4 (four) hours as needed for severe pain. 30 tablet 0   No current facility-administered medications on file prior to visit.    ALLERGIES: Allergies  Allergen Reactions  . Codeine Itching and Nausea Only    FAMILY HISTORY: Family History  Problem Relation Age of Onset  . Cancer Mother   . Dementia Sister   . Colon cancer Neg Hx     SOCIAL HISTORY: Social History   Socioeconomic History  . Marital status: Divorced    Spouse name: Not on file  . Number of children: Not on file  . Years of education: Not on file  . Highest education level: Associate degree: academic program  Occupational History  . Occupation: retired    Comment: former Naval architect  Tobacco Use  . Smoking status: Former Smoker    Packs/day: 0.50    Years: 45.00    Pack years: 22.50    Types: Cigarettes    Quit date: 03/2017    Years since quitting: 3.3  . Smokeless tobacco: Never Used  . Tobacco comment: pt states quit smoking 1 wk ago  Vaping Use  . Vaping Use: Never used  Substance and Sexual Activity  . Alcohol use: No  . Drug use: No  . Sexual activity: Not Currently    Birth control/protection: None  Other Topics Concern  . Not on file  Social History Narrative   Divorced, lives  with son in a 2 story home, though Pt stays in 1st level. He drinks one cup of coffee and one soda a day. Is to begin O/P P/T next week.   Social Determinants of Health   Financial Resource Strain:   . Difficulty of Paying Living Expenses: Not on file  Food Insecurity:   . Worried About Programme researcher, broadcasting/film/video in the Last Year: Not on file  . Ran Out of Food in the Last Year: Not on file  Transportation Needs:   . Lack of Transportation (Medical): Not on file  . Lack of Transportation (Non-Medical): Not on file  Physical Activity:   . Days of Exercise per Week: Not on file  . Minutes of Exercise per Session: Not on file  Stress:   . Feeling of Stress : Not on file  Social Connections:   . Frequency of Communication with Friends and Family: Not on file  . Frequency of Social Gatherings with Friends and Family: Not on file  .  Attends Religious Services: Not on file  . Active Member of Clubs or Organizations: Not on file  . Attends Banker Meetings: Not on file  . Marital Status: Not on file  Intimate Partner Violence:   . Fear of Current or Ex-Partner: Not on file  . Emotionally Abused: Not on file  . Physically Abused: Not on file  . Sexually Abused: Not on file     Objective:  Blood pressure 123/77, pulse 79, height 6' (1.829 m), weight 153 lb 9.6 oz (69.7 kg), SpO2 97 %. General: No acute distress.  Patient appears well-groomed.   Head:  Normocephalic/atraumatic Eyes:  Fundi examined but not visualized Neck: supple, no paraspinal tenderness, full range of motion Heart:  Regular rate and rhythm Lungs:  Clear to auscultation bilaterally Back: No paraspinal tenderness Neurological Exam: alert and oriented to person, place, and time. Attention span and concentration intact, recent and remote memory intact, fund of knowledge intact.  Speech fluent and not dysarthric, language intact.  Mild left lower facial weakness.  Otherwise, CN II-XII intact. Mild increased tone on  left, muscle strength 4+/5 left upper and lower extremities, 5/5 on right  Sensation to pinprick reduced in left upper and lower extremities.  Vibratory sensation intact.  Deep tendon reflexes 3+ left upper and lower extremities, 2+ right sided.  Finger to nose testing intact.  In wheelchair.  Gait testing deferred.   Assessment/Plan:   1.  Multiple strokes and TIAs secondary to small vessel disease 2.  Residual left spastic hemiparesis and left sided numbness as late effect or prior stroke. 3.  Hypertension 4.  Hyperlipidemia  1.  Secondary stroke prevention as managed by PCP: -  Plavix 75mg  daily -  Atorvastatin 80mg  daily.  LDL goal less than 70 -  Blood pressure management -  Glycemic control. Hgb A1c goal less than 7 2.  Follow up one year  , DO  CC: , MD

## 2020-07-10 ENCOUNTER — Encounter: Payer: Self-pay | Admitting: Neurology

## 2020-07-10 ENCOUNTER — Other Ambulatory Visit: Payer: Self-pay

## 2020-07-10 ENCOUNTER — Ambulatory Visit (INDEPENDENT_AMBULATORY_CARE_PROVIDER_SITE_OTHER): Payer: Medicare Other | Admitting: Neurology

## 2020-07-10 VITALS — BP 123/77 | HR 79 | Ht 72.0 in | Wt 153.6 lb

## 2020-07-10 DIAGNOSIS — I63311 Cerebral infarction due to thrombosis of right middle cerebral artery: Secondary | ICD-10-CM

## 2020-07-10 DIAGNOSIS — I10 Essential (primary) hypertension: Secondary | ICD-10-CM | POA: Diagnosis not present

## 2020-07-10 DIAGNOSIS — E785 Hyperlipidemia, unspecified: Secondary | ICD-10-CM | POA: Diagnosis not present

## 2020-07-10 DIAGNOSIS — I69352 Hemiplegia and hemiparesis following cerebral infarction affecting left dominant side: Secondary | ICD-10-CM | POA: Diagnosis not present

## 2020-07-10 NOTE — Patient Instructions (Signed)
1.  Continue Plavix 75mg  daily 2.  Continue atorvastatin 80mg  daily 3.  Continue blood pressure medications 4.  Follow up in one year

## 2020-07-28 ENCOUNTER — Other Ambulatory Visit: Payer: Self-pay | Admitting: Neurology

## 2020-12-28 ENCOUNTER — Other Ambulatory Visit: Payer: Self-pay | Admitting: Neurology

## 2020-12-29 ENCOUNTER — Other Ambulatory Visit: Payer: Self-pay | Admitting: Neurology

## 2021-03-15 ENCOUNTER — Ambulatory Visit (HOSPITAL_COMMUNITY): Payer: Medicare Other | Admitting: Physical Therapy

## 2021-04-22 ENCOUNTER — Other Ambulatory Visit: Payer: Self-pay

## 2021-04-22 ENCOUNTER — Ambulatory Visit (HOSPITAL_COMMUNITY): Payer: Medicare Other | Attending: Podiatry | Admitting: Physical Therapy

## 2021-04-22 DIAGNOSIS — R262 Difficulty in walking, not elsewhere classified: Secondary | ICD-10-CM | POA: Diagnosis present

## 2021-04-22 DIAGNOSIS — M6281 Muscle weakness (generalized): Secondary | ICD-10-CM | POA: Diagnosis present

## 2021-04-22 DIAGNOSIS — M25572 Pain in left ankle and joints of left foot: Secondary | ICD-10-CM | POA: Insufficient documentation

## 2021-04-22 NOTE — Therapy (Signed)
[facility] [address] [place], [place], [address] Phone: [phone]   Fax:  [phone]  Physical Therapy Evaluation  Patient Details  Name: Brendan Charles MRN: [id number] Date of Birth: 1955/12/25 Referring Provider (Brendan): Erskine Emery   Encounter Date: 04/22/2021   Brendan End of Session - 04/22/21 1515     Visit Number 1    Number of Visits 12    Date for Brendan Re-Evaluation 06/03/21    Authorization Type medicare/[employer]    Progress Note Due on Visit 10    Brendan Start Time 1405    Brendan Stop Time 1445    Brendan Time Calculation (min) 40 min    Activity Tolerance Patient tolerated treatment well    Behavior During Therapy [facility] for tasks assessed/performed             Past Medical History:  Diagnosis Date   Anxiety    Chronic back pain    CVA (cerebral vascular accident) (HCC) 03/20/2017   Hepatitis C    HEP C, never treated, 2007   Hypertension     Past Surgical History:  Procedure Laterality Date   CERVICAL DISC SURGERY  2001   anterior   COLONOSCOPY WITH PROPOFOL N/A 03/14/2017   Procedure: COLONOSCOPY WITH PROPOFOL;  Surgeon: West Bali, MD;  [facility];  Service: Endoscopy;  Laterality: N/A;  8:15am   CYSTOSCOPY W/ URETERAL STENT PLACEMENT Left 03/23/2017   Procedure: CYSTOSCOPY WITH LEFT RETROGRADE PYELOGRAM/URETERAL LEFT STENT PLACEMENT;  Surgeon: Malen Gauze, MD;  Location: [facility];  Service: Urology;  Laterality: Left;   CYSTOSCOPY/RETROGRADE/URETEROSCOPY Left 07/17/2017   Procedure: CYSTOSCOPY/RETROGRADE/URETEROSCOPY, stent removal, stone extraction, stent replacement;  Surgeon: Malen Gauze, MD;  Location: [facility];  Service: Urology;  Laterality: Left;   HOLMIUM LASER APPLICATION Left 07/17/2017   Procedure: HOLMIUM LASER APPLICATION;  Surgeon: Malen Gauze, MD;  Location: [facility];  Service: Urology;  Laterality: Left;   LUMBAR SPINE SURGERY  2015   POLYPECTOMY  03/14/2017    Procedure: POLYPECTOMY;  Surgeon: West Bali, MD;  [facility];  Service: Endoscopy;;  rectum   TEE WITHOUT CARDIOVERSION N/A 09/19/2017   Procedure: TRANSESOPHAGEAL ECHOCARDIOGRAM (TEE);  Surgeon: Antoine Poche, MD;  [facility];  Service: Endoscopy;  Laterality: N/A;    There were no vitals filed for this visit.    Subjective Assessment - 04/22/21 1507     Subjective Brendan reports that he has had 2 strokes back to back in mid-late 2018. He states that he has L sided residual hemiplegia and L sided numbness. He fell and broke his L foot April 2019. He was put in a CAM boot and let it heal conservatively. He states that since he broke his foot.   He started therapy in early 2020 for his foot but only had 2 treatments and then the clinic shut down due to COVID.  He was told that someone would call him back once the clinic opened but they never did.  His Lt great toe has been slowly pulling in and his left  toes have slowly been curling under making it very difficulty for him to walk. He generally is a household ambulator with his rolling walker but here lately he has been staying in bed  because it is to painful and difficult to walk.    Pertinent History 2 CVA's, fx LT foot    Limitations Walking;Standing    How long  can you stand comfortably? 1 minute    How long can you walk comfortably? 1 minute    Patient Stated Goals to be able to walk without having pain in his Lt foot    Currently in Pain? No/denies   Brendan is sitting will get as high as a 8-9               [facility] Brendan Assessment - 04/22/21 0001       Assessment   Medical Diagnosis abnormal gait with Lt leg weakness    Referring Provider (Brendan) Erskine Emery    Onset Date/Surgical Date 12/06/20    Prior Therapy when Covid started clinic closed and nobody called Brendan back      Precautions   Precautions Fall      Restrictions   Weight Bearing Restrictions No      Balance Screen   Has the patient  fallen in the past 6 months No    Has the patient had a decrease in activity level because of a fear of falling?  Yes    Is the patient reluctant to leave their home because of a fear of falling?  No      Cognition   Overall Cognitive Status Within Functional Limits for tasks assessed      Functional Tests   Functional tests Sit to Stand;Single leg stance      Single Leg Stance   Comments unable      Sit to Stand   Comments unable      ROM / Strength   AROM / PROM / Strength Strength      Strength   Strength Assessment Site Ankle;Hip    Right/Left Hip Left    Left Hip Flexion 3/5    Left Hip Extension 2/5    Left Hip ABduction 3-/5    Right/Left Ankle Right;Left    Left Ankle Dorsiflexion 3-/5    Left Ankle Plantar Flexion 3-/5    Left Ankle Inversion 3-/5    Left Ankle Eversion 3-/5      Ambulation/Gait   Ambulation/Gait Yes    Ambulation/Gait Assistance 4: Min guard    Ambulation Distance (Feet) 60 Feet    Assistive device Rolling walker    Gait Pattern Decreased dorsiflexion - left    Gait Comments one minute Brendan fatigued unable to continue                        Objective measurements completed on examination: See above findings.       [facility] Adult Brendan Treatment/Exercise - 04/22/21 0001       Exercises   Exercises Knee/Hip      Knee/Hip Exercises: Seated   Other Seated Knee/Hip Exercises toe spread, toe extension, PROM x 10      Knee/Hip Exercises: Supine   Bridges with Ball Squeeze 10 reps    Straight Leg Raises Left;10 reps      Knee/Hip Exercises: Sidelying   Hip ABduction Left;10 reps                     Brendan Education - 04/22/21 1515     Education Details HEP    Person(s) Educated Patient    Methods Explanation    Comprehension Verbalized understanding              Brendan Short Term Goals - 04/22/21 1525       Brendan SHORT TERM GOAL #1   Title  Brendan to be I in HEP to improve LT LE strength.    Time 3    Period Weeks     Status New    Target Date 05/13/21      Brendan SHORT TERM GOAL #2   Title Brendan to be able to walk for 2 minutes without resting without having increased pain in Left foot    Time 3    Period Weeks    Status New               Brendan Long Term Goals - 04/22/21 1526       Brendan LONG TERM GOAL #1   Title Brendan to be I in HEP to increase Lt LE strength by one grade to be able to come sit to stand with one UE assist    Time 6    Period Weeks    Status New    Target Date 06/03/21      Brendan LONG TERM GOAL #2   Title Brendan to be able to single leg stance for 5 second on both LE to reduce risk of falling    Time 6    Period Weeks    Status New      Brendan LONG TERM GOAL #3   Title Brendan to be able to walk for four minutes without resting with Rolling walker to assist son in getting Brendan to MD appointments.    Time 6    Period Weeks    Status New                    Plan - 04/22/21 1517     Clinical Impression Statement Brendan reports that he has had 2 strokes back to back in mid-late 2018. He states that he has L sided residual hemiplegia and L sided numbness. He fell and broke his L foot April 2019. He was put in a CAM boot and let it heal conservatively. He states that since he broke his foot .  He had two sessions of therapy then COVID hit and the clinic shut down.  Nobody called him back so he just got along as best as he can.  He has stopped walking in his home due to the pain in his foot.  He had a custom AFO but has lost it, however, when questioned the Brendan states that he did not feel that the AFO assisted in improving his gait.  Evaluation demonstrates decreased strength, decreased balance and difficulty walking.  Brendan Charles will benefit from skilled Brendan to address these issues and improve his functional ability while decreasing his  fall riskl    Personal Factors and Comorbidities Comorbidity 3+;Fitness;Time since onset of injury/illness/exacerbation    Comorbidities CVA's, fx foot     Examination-Activity Limitations Carry;Dressing;Lift;Locomotion Level;Squat;Stairs;Stand;Transfers    Examination-Participation Restrictions Cleaning;Community Activity;Meal Prep;Shop    Stability/Clinical Decision Making Evolving/Moderate complexity    Clinical Decision Making Moderate    Rehab Potential Good    Brendan Frequency 2x / week    Brendan Duration 6 weeks    Brendan Treatment/Interventions Gait training;Stair training;Functional mobility training;Therapeutic activities;Therapeutic exercise;Balance training;Patient/family education;Manual techniques;Passive range of motion    Brendan Next Visit Plan Continue with strengthening, gait and balance as well as PROM for LT ankle and toes    Brendan Home Exercise Plan Eval: toe spred, toe extension, ankle DF, bridge, sidelying abduction, Lt hip flexion             Patient will benefit  from skilled therapeutic intervention in order to improve the following deficits and impairments:  Abnormal gait, Decreased activity tolerance, Decreased balance, Decreased endurance, Decreased mobility, Difficulty walking, Decreased strength, Increased fascial restricitons, Pain  Visit Diagnosis: Pain in left ankle and joints of left foot - Plan: Brendan plan of care cert/re-cert  Difficulty in walking, not elsewhere classified - Plan: Brendan plan of care cert/re-cert  Muscle weakness (generalized) - Plan: Brendan plan of care cert/re-cert     Problem List Patient Active Problem List   Diagnosis Date Noted   Spastic hemiparesis of left dominant side as late effect of cerebral infarction (HCC) 04/19/2017   Gait disturbance, post-stroke 04/18/2017   Adjustment disorder with depressed mood    Cerebral infarction due to thrombosis of right middle cerebral artery (HCC) 04/13/2017   Weakness    Acute ischemic stroke (HCC) 04/12/2017   Stroke (cerebrum) (HCC) 04/12/2017   Acute CVA (cerebrovascular accident) (HCC) 04/12/2017   CVA (cerebral vascular accident) (HCC) 03/20/2017    Hydronephrosis, left 03/20/2017   Aortic atherosclerosis (HCC) 03/20/2017   Hepatitis C 02/14/2017   Encounter for screening colonoscopy 02/14/2017   Spondylolisthesis at L3-L4 level 06/03/2014   Brendan Charles, Brendan Charles  04/22/2021, 3:32 PM  [facility] [address] [place], [place], [address] Phone: [phone]   Fax:  [phone]  Name: Brendan Charles MRN: [id number] Date of Birth: 08-25-1955

## 2021-04-27 ENCOUNTER — Ambulatory Visit (HOSPITAL_COMMUNITY): Payer: Medicare Other | Admitting: Physical Therapy

## 2021-04-27 ENCOUNTER — Other Ambulatory Visit: Payer: Self-pay

## 2021-04-27 DIAGNOSIS — M25572 Pain in left ankle and joints of left foot: Secondary | ICD-10-CM | POA: Diagnosis not present

## 2021-04-27 DIAGNOSIS — R262 Difficulty in walking, not elsewhere classified: Secondary | ICD-10-CM

## 2021-04-27 DIAGNOSIS — M6281 Muscle weakness (generalized): Secondary | ICD-10-CM

## 2021-04-27 NOTE — Therapy (Signed)
[facility] [address] [place], [place], [address] Phone: [phone]   Fax:  [phone]  Physical Therapy Treatment  Patient Details  Name: Brendan Charles MRN: [id number] Date of Birth: 30-Oct-1955 Referring Provider (PT): Erskine Emery   Encounter Date: 04/27/2021   PT End of Session - 04/27/21 1244     Visit Number 2    Number of Visits 12    Date for PT Re-Evaluation 06/03/21    Authorization Type medicare/[employer]    Progress Note Due on Visit 10    PT Start Time 0920    PT Stop Time 1005    PT Time Calculation (min) 45 min    Activity Tolerance Patient tolerated treatment well    Behavior During Therapy [facility] for tasks assessed/performed             Past Medical History:  Diagnosis Date   Anxiety    Chronic back pain    CVA (cerebral vascular accident) (HCC) 03/20/2017   Hepatitis C    HEP C, never treated, 2007   Hypertension     Past Surgical History:  Procedure Laterality Date   CERVICAL DISC SURGERY  2001   anterior   COLONOSCOPY WITH PROPOFOL N/A 03/14/2017   Procedure: COLONOSCOPY WITH PROPOFOL;  Surgeon: West Bali, MD;  [facility];  Service: Endoscopy;  Laterality: N/A;  8:15am   CYSTOSCOPY W/ URETERAL STENT PLACEMENT Left 03/23/2017   Procedure: CYSTOSCOPY WITH LEFT RETROGRADE PYELOGRAM/URETERAL LEFT STENT PLACEMENT;  Surgeon: Malen Gauze, MD;  Location[facility];  Service: Urology;  Laterality: Left;   CYSTOSCOPY/RETROGRADE/URETEROSCOPY Left 07/17/2017   Procedure: CYSTOSCOPY/RETROGRADE/URETEROSCOPY, stent removal, stone extraction, stent replacement;  Surgeon: Malen Gauze, MD;  [facility]: [facility];  Service: Urology;  Laterality: Left;   HOLMIUM LASER APPLICATION Left 07/17/2017   Procedure: HOLMIUM LASER APPLICATION;  Surgeon: Malen Gauze, MD;  [facility];  Service: Urology;  Laterality: Left;   LUMBAR SPINE SURGERY  2015   POLYPECTOMY  03/14/2017   Procedure:  POLYPECTOMY;  Surgeon: West Bali, MD;  [facility];  Service: Endoscopy;;  rectum   TEE WITHOUT CARDIOVERSION N/A 09/19/2017   Procedure: TRANSESOPHAGEAL ECHOCARDIOGRAM (TEE);  Surgeon: Antoine Poche, MD;  [facility];  Service: Endoscopy;  Laterality: N/A;    There were no vitals filed for this visit.   Subjective Assessment - 04/27/21 0920     Subjective Pt comes with his son today.  States he is doing his HEP and he is sore in his Lt LE.                               [facility] Adult PT Treatment/Exercise - 04/27/21 0001       Knee/Hip Exercises: Seated   Long Arc Quad Right;Left;10 reps    Other Seated Knee/Hip Exercises toe spread, toe extension, towel crunches    Other Seated Knee/Hip Exercises towel Inversion/eversion 2 minutes each    Sit to Sand 5 reps;without UE support      Knee/Hip Exercises: Supine   Short Arc Quad Sets Both;10 reps    Heel Slides Both;10 reps    Bridges 10 reps    Bridges with [employer] 10 reps    Straight Leg Raises 10 reps;Both      Knee/Hip Exercises: Sidelying   Hip ABduction Both;10 reps    Clams both 10X5"  Manual Therapy   Manual Therapy Passive ROM    Manual therapy comments completered seperately from all other skilled interventions    Passive ROM to Lt foot and toes                     PT Education - 04/27/21 1243     Education Details review of goals, HEP and POC moving forward    Person(s) Educated Patient    Methods Explanation;Demonstration    Comprehension Verbalized understanding;Returned demonstration;Verbal cues required;Tactile cues required              PT Short Term Goals - 04/27/21 0924       PT SHORT TERM GOAL #1   Title PT to be I in HEP to improve LT LE strength.    Time 3    Period Weeks    Status On-going    Target Date 05/13/21      PT SHORT TERM GOAL #2   Title PT to be able to walk for 2 minutes without resting without having  increased pain in Left foot    Time 3    Period Weeks    Status New               PT Long Term Goals - 04/27/21 7829       PT LONG TERM GOAL #1   Title Pt to be I in HEP to increase Lt LE strength by one grade to be able to come sit to stand with one UE assist    Time 6    Period Weeks    Status On-going      PT LONG TERM GOAL #2   Title PT to be able to single leg stance for 5 second on both LE to reduce risk of falling    Time 6    Period Weeks    Status On-going      PT LONG TERM GOAL #3   Title PT to be able to walk for four minutes without resting with Rolling walker to assist son in getting pt to MD appointments.    Time 6    Period Weeks    Status On-going                   Plan - 04/27/21 1245     Clinical Impression Statement Reviewed goals and POC moving forward.  Began session with intrinsic foot and ankle strengthening exercises in seated.  Began Inv/eversion using towel with stabilization of knee required to isolate ankle mm.    Began sit to stands without UE assist with cues to center weight over toes, especially with eccentric lowering.  Pt able to complete last one with encouragement and without AA from therapist.  Instructed to work on this one at home and rely less on UE's to power self up from chair.  Passive ROM completed to Lt foot/toes.    Personal Factors and Comorbidities Comorbidity 3+;Fitness;Time since onset of injury/illness/exacerbation    Comorbidities CVA's, fx foot    Examination-Activity Limitations Carry;Dressing;Lift;Locomotion Level;Squat;Stairs;Stand;Transfers    Examination-Participation Restrictions Cleaning;Community Activity;Meal Prep;Shop    Stability/Clinical Decision Making Evolving/Moderate complexity    Rehab Potential Good    PT Frequency 2x / week    PT Duration 6 weeks    PT Treatment/Interventions Gait training;Stair training;Functional mobility training;Therapeutic activities;Therapeutic exercise;Balance  training;Patient/family education;Manual techniques;Passive range of motion    PT Next Visit Plan Continue with strengthening for bil LE's, gait and  balance as well as PROM for LT ankle and toes    PT Home Exercise Plan Eval: toe spred, toe extension, ankle DF, bridge, sidelying abduction, Lt hip flexion 9/20: sit to stand             Patient will benefit from skilled therapeutic intervention in order to improve the following deficits and impairments:  Abnormal gait, Decreased activity tolerance, Decreased balance, Decreased endurance, Decreased mobility, Difficulty walking, Decreased strength, Increased fascial restricitons, Pain  Visit Diagnosis: Pain in left ankle and joints of left foot  Difficulty in walking, not elsewhere classified  Muscle weakness (generalized)     Problem List Patient Active Problem List   Diagnosis Date Noted   Spastic hemiparesis of left dominant side as late effect of cerebral infarction (HCC) 04/19/2017   Gait disturbance, post-stroke 04/18/2017   Adjustment disorder with depressed mood    Cerebral infarction due to thrombosis of right middle cerebral artery (HCC) 04/13/2017   Weakness    Acute ischemic stroke (HCC) 04/12/2017   Stroke (cerebrum) (HCC) 04/12/2017   Acute CVA (cerebrovascular accident) (HCC) 04/12/2017   CVA (cerebral vascular accident) (HCC) 03/20/2017   Hydronephrosis, left 03/20/2017   Aortic atherosclerosis (HCC) 03/20/2017   Hepatitis C 02/14/2017   Encounter for screening colonoscopy 02/14/2017   Spondylolisthesis at L3-L4 level 06/03/2014   Lurena Nida, PTA/CLT [phone]  Lurena Nida, PTA 04/27/2021, 12:47 PM  [facility] [address] [place], [place], [address] Phone: [phone]   Fax:  [phone]  Name: ONAJE WARNE MRN: [id number] Date of Birth: 17-Dec-1955

## 2021-04-29 ENCOUNTER — Ambulatory Visit (HOSPITAL_COMMUNITY): Payer: Medicare Other | Admitting: Physical Therapy

## 2021-04-29 ENCOUNTER — Other Ambulatory Visit: Payer: Self-pay

## 2021-04-29 DIAGNOSIS — R262 Difficulty in walking, not elsewhere classified: Secondary | ICD-10-CM

## 2021-04-29 DIAGNOSIS — M6281 Muscle weakness (generalized): Secondary | ICD-10-CM

## 2021-04-29 DIAGNOSIS — M25572 Pain in left ankle and joints of left foot: Secondary | ICD-10-CM | POA: Diagnosis not present

## 2021-04-29 NOTE — Therapy (Signed)
[facility] [address] [place], [place], [address] Phone: [phone]   Fax:  [phone]  Physical Therapy Treatment  Patient Details  Name: Brendan Charles MRN: [id number] Date of Birth: 11-Jul-1956 Referring Provider (PT): Erskine Emery   Encounter Date: 04/29/2021   PT End of Session - 04/29/21 1000     Visit Number 3    Number of Visits 12    Date for PT Re-Evaluation 06/03/21    Authorization Type medicare/[employer]    Progress Note Due on Visit 10    PT Start Time 0830    PT Stop Time 0920    PT Time Calculation (min) 50 min    Activity Tolerance Patient tolerated treatment well    Behavior During Therapy [facility] for tasks assessed/performed             Past Medical History:  Diagnosis Date   Anxiety    Chronic back pain    CVA (cerebral vascular accident) (HCC) 03/20/2017   Hepatitis C    HEP C, never treated, 2007   Hypertension     Past Surgical History:  Procedure Laterality Date   CERVICAL DISC SURGERY  2001   anterior   COLONOSCOPY WITH PROPOFOL N/A 03/14/2017   Procedure: COLONOSCOPY WITH PROPOFOL;  Surgeon: West Bali, MD;  [facility];  Service: Endoscopy;  Laterality: N/A;  8:15am   CYSTOSCOPY W/ URETERAL STENT PLACEMENT Left 03/23/2017   Procedure: CYSTOSCOPY WITH LEFT RETROGRADE PYELOGRAM/URETERAL LEFT STENT PLACEMENT;  Surgeon: Malen Gauze, MD;  [facility];  Service: Urology;  Laterality: Left;   CYSTOSCOPY/RETROGRADE/URETEROSCOPY Left 07/17/2017   Procedure: CYSTOSCOPY/RETROGRADE/URETEROSCOPY, stent removal, stone extraction, stent replacement;  Surgeon: Malen Gauze, MD;  [facility];  Service: Urology;  Laterality: Left;   HOLMIUM LASER APPLICATION Left 07/17/2017   Procedure: HOLMIUM LASER APPLICATION;  Surgeon: Malen Gauze, MD;  [facility];  Service: Urology;  Laterality: Left;   LUMBAR SPINE SURGERY  2015   POLYPECTOMY  03/14/2017   Procedure:  POLYPECTOMY;  Surgeon: West Bali, MD;  [facility];  Service: Endoscopy;;  rectum   TEE WITHOUT CARDIOVERSION N/A 09/19/2017   Procedure: TRANSESOPHAGEAL ECHOCARDIOGRAM (TEE);  Surgeon: Antoine Poche, MD;  [facility];  Service: Endoscopy;  Laterality: N/A;    There were no vitals filed for this visit.   Subjective Assessment - 04/29/21 0842     Subjective pt states he was so tired yesterday he didn't do anything.  STates his Lt LE hurts at 14/10.    Currently in Pain? Yes    Pain Score 10-Worst pain ever    Pain Location Leg    Pain Orientation Left    Pain Descriptors / Indicators Aching;Numbness;Discomfort                               [facility] Adult PT Treatment/Exercise - 04/29/21 0001       Knee/Hip Exercises: Standing   Heel Raises Both;2 sets;10 reps    Hip Flexion Both;2 sets;10 reps    Hip Flexion Limitations alternating marching    Hip Abduction Both;2 sets;10 reps    Abduction Limitations with tactile cues for posture/form from therapist      Knee/Hip Exercises: Seated   Long Arc Quad Right;Left;10 reps    Sit to [employer] 5 reps;without UE support   with airex in chair  Manual Therapy   Manual Therapy Passive ROM    Manual therapy comments completered seperately from all other skilled interventions    Passive ROM to Lt foot and toes                     PT Education - 04/29/21 0957     Education Details encouraged to complete HEP daily; suggested splitting it up doing several in morning and evening. Updated HEP to include ankle/toe exercises, heelraises in standing and LAQ    Person(s) Educated Patient;Child(ren)    Methods Explanation    Comprehension Verbalized understanding              PT Short Term Goals - 04/27/21 0924       PT SHORT TERM GOAL #1   Title PT to be I in HEP to improve LT LE strength.    Time 3    Period Weeks    Status On-going    Target Date 05/13/21      PT  SHORT TERM GOAL #2   Title PT to be able to walk for 2 minutes without resting without having increased pain in Left foot    Time 3    Period Weeks    Status New               PT Long Term Goals - 04/27/21 2831       PT LONG TERM GOAL #1   Title Pt to be I in HEP to increase Lt LE strength by one grade to be able to come sit to stand with one UE assist    Time 6    Period Weeks    Status On-going      PT LONG TERM GOAL #2   Title PT to be able to single leg stance for 5 second on both LE to reduce risk of falling    Time 6    Period Weeks    Status On-going      PT LONG TERM GOAL #3   Title PT to be able to walk for four minutes without resting with Rolling walker to assist son in getting pt to MD appointments.    Time 6    Period Weeks    Status On-going                   Plan - 04/29/21 1020     Clinical Impression Statement Pt reported being exhausted from previous appointment and admits to not doing any activity yesterday.  Encouraged to continue to push self and complete therex even if just a few in the morning and evening to break it up and lessen fatigue.  Progressed to standing this session with addition of heelraises, march and hip abduction.  Pt required one seated rest break during these exercises.  Unable to complete standing from normal chair without UE so added airex and max cues to power up to standing using glutes/quads.  Updated HEP to include exercises added last session and today.  Good heelrise with heel raise activity encouraging toe extension as well.  Improved mobility noted in forefoot and toes today with PROM.  Pt with concerns regarding his Lt hand stiffness and lack of coordination.  Suggested to discuss with MD regarding possible OT referral.    Personal Factors and Comorbidities Comorbidity 3+;Fitness;Time since onset of injury/illness/exacerbation    Comorbidities CVA's, fx foot    Examination-Activity Limitations  Carry;Dressing;Lift;Locomotion Level;Squat;Stairs;Stand;Transfers    Examination-Participation Restrictions  Cleaning;Community Activity;Meal Prep;Shop    Stability/Clinical Decision Making Evolving/Moderate complexity    Rehab Potential Good    PT Frequency 2x / week    PT Duration 6 weeks    PT Treatment/Interventions Gait training;Stair training;Functional mobility training;Therapeutic activities;Therapeutic exercise;Balance training;Patient/family education;Manual techniques;Passive range of motion    PT Next Visit Plan Continue with strengthening for bil LE's, gait and balance as well as PROM for LT ankle and toes    PT Home Exercise Plan Eval: toe spred, toe extension, ankle DF, bridge, sidelying abduction, Lt hip flexion 9/20: sit to stand  9:22: towel inv/ev, toecrunch, standing heel raises, LAQ             Patient will benefit from skilled therapeutic intervention in order to improve the following deficits and impairments:  Abnormal gait, Decreased activity tolerance, Decreased balance, Decreased endurance, Decreased mobility, Difficulty walking, Decreased strength, Increased fascial restricitons, Pain  Visit Diagnosis: Difficulty in walking, not elsewhere classified  Muscle weakness (generalized)  Pain in left ankle and joints of left foot     Problem List Patient Active Problem List   Diagnosis Date Noted   Spastic hemiparesis of left dominant side as late effect of cerebral infarction (HCC) 04/19/2017   Gait disturbance, post-stroke 04/18/2017   Adjustment disorder with depressed mood    Cerebral infarction due to thrombosis of right middle cerebral artery (HCC) 04/13/2017   Weakness    Acute ischemic stroke (HCC) 04/12/2017   Stroke (cerebrum) (HCC) 04/12/2017   Acute CVA (cerebrovascular accident) (HCC) 04/12/2017   CVA (cerebral vascular accident) (HCC) 03/20/2017   Hydronephrosis, left 03/20/2017   Aortic atherosclerosis (HCC) 03/20/2017   Hepatitis C  02/14/2017   Encounter for screening colonoscopy 02/14/2017   Spondylolisthesis at L3-L4 level 06/03/2014   Lurena Nida, PTA/CLT [phone]  Lurena Nida, PTA 04/29/2021, 10:21 AM  [facility] [address] [place], [place], [address] Phone: [phone]   Fax:  [phone]  Name: DAQUAVION CATALA MRN: [id number] Date of Birth: 24-Jun-1956

## 2021-05-05 ENCOUNTER — Other Ambulatory Visit: Payer: Self-pay

## 2021-05-05 ENCOUNTER — Ambulatory Visit (HOSPITAL_COMMUNITY): Payer: Medicare Other

## 2021-05-05 DIAGNOSIS — M6281 Muscle weakness (generalized): Secondary | ICD-10-CM

## 2021-05-05 DIAGNOSIS — M25572 Pain in left ankle and joints of left foot: Secondary | ICD-10-CM

## 2021-05-05 DIAGNOSIS — R262 Difficulty in walking, not elsewhere classified: Secondary | ICD-10-CM

## 2021-05-05 NOTE — Therapy (Signed)
[facility] [address] [place], [place], [address] Phone: [phone]   Fax:  [phone]  Physical Therapy Treatment  Patient Details  Name: Brendan Charles MRN: [id number] Date of Birth: [birth year] Referring Provider (PT): Erskine Emery   Encounter Date: 05/05/2021   PT End of Session - 05/05/21 0819     Visit Number 4    Number of Visits 12    Date for PT Re-Evaluation 06/03/21    Authorization Type medicare/[employer]    Progress Note Due on Visit 10    PT Start Time 0815    PT Stop Time 0900    PT Time Calculation (min) 45 min    Activity Tolerance Patient tolerated treatment well    Behavior During Therapy [facility] for tasks assessed/performed             Past Medical History:  Diagnosis Date   Anxiety    Chronic back pain    CVA (cerebral vascular accident) (HCC) 03/20/2017   Hepatitis C    HEP C, never treated, 2007   Hypertension     Past Surgical History:  Procedure Laterality Date   CERVICAL DISC SURGERY  2001   anterior   COLONOSCOPY WITH PROPOFOL N/A 03/14/2017   Procedure: COLONOSCOPY WITH PROPOFOL;  Surgeon: West Bali, MD;  [facility];  Service: Endoscopy;  Laterality: N/A;  8:15am   CYSTOSCOPY W/ URETERAL STENT PLACEMENT Left 03/23/2017   Procedure: CYSTOSCOPY WITH LEFT RETROGRADE PYELOGRAM/URETERAL LEFT STENT PLACEMENT;  Surgeon: Malen Gauze, MD;  [facility];  Service: Urology;  Laterality: Left;   CYSTOSCOPY/RETROGRADE/URETEROSCOPY Left 07/17/2017   Procedure: CYSTOSCOPY/RETROGRADE/URETEROSCOPY, stent removal, stone extraction, stent replacement;  Surgeon: Malen Gauze, MD;  [facility];  Service: Urology;  Laterality: Left;   HOLMIUM LASER APPLICATION Left 07/17/2017   Procedure: HOLMIUM LASER APPLICATION;  Surgeon: Malen Gauze, MD;  [facility];  Service: Urology;  Laterality: Left;   LUMBAR SPINE SURGERY  2015   POLYPECTOMY  03/14/2017   Procedure:  POLYPECTOMY;  Surgeon: West Bali, MD;  [facility];  Service: Endoscopy;;  rectum   TEE WITHOUT CARDIOVERSION N/A 09/19/2017   Procedure: TRANSESOPHAGEAL ECHOCARDIOGRAM (TEE);  Surgeon: Antoine Poche, MD;  [facility];  Service: Endoscopy;  Laterality: N/A;    There were no vitals filed for this visit.                      [facility] Adult PT Treatment/Exercise - 05/05/21 0001       Exercises   Exercises Ankle      Knee/Hip Exercises: Stretches   Gastroc Stretch Left;2 reps;60 seconds      Knee/Hip Exercises: Standing   Heel Raises Both;3 sets;10 reps    Heel Raises Limitations on airex pad    Hip Flexion Left;3 sets;10 reps    Hip Flexion Limitations 10# 6" stair tap    Hip Abduction Left;3 sets;10 reps    Abduction Limitations 10 lbs    Other Standing Knee Exercises static standing on airex pad 3x60 sec for lateral weight shift      Knee/Hip Exercises: Seated   Long Arc Quad Strengthening;Left;3 sets;10 reps    Long Arc Quad Weight 10 lbs.    Other Seated Knee/Hip Exercises toe spread, toe extension, towel crunches, ankle DF/PF over towel roll    Other Seated Knee/Hip Exercises towel Inversion/eversion 2 minutes each  PT Education - 05/05/21 0845     Education Details Discussed use of AFO for left foot to help foot posture and imrpove safety with gait and transfers to minimize effects of left foot drop    Person(s) Educated Patient;Caregiver(s)    Methods Explanation    Comprehension Verbalized understanding              PT Short Term Goals - 04/27/21 0924       PT SHORT TERM GOAL #1   Title PT to be I in HEP to improve LT LE strength.    Time 3    Period Weeks    Status On-going    Target Date 05/13/21      PT SHORT TERM GOAL #2   Title PT to be able to walk for 2 minutes without resting without having increased pain in Left foot    Time 3    Period Weeks    Status New                PT Long Term Goals - 04/27/21 5885       PT LONG TERM GOAL #1   Title Pt to be I in HEP to increase Lt LE strength by one grade to be able to come sit to stand with one UE assist    Time 6    Period Weeks    Status On-going      PT LONG TERM GOAL #2   Title PT to be able to single leg stance for 5 second on both LE to reduce risk of falling    Time 6    Period Weeks    Status On-going      PT LONG TERM GOAL #3   Title PT to be able to walk for four minutes without resting with Rolling walker to assist son in getting pt to MD appointments.    Time 6    Period Weeks    Status On-going                   Plan - 05/05/21 0900     Clinical Impression Statement Tolerated increase to resistance activities requiring tactile cues for selective control LLE.  Educated on benefit of AFO to minimize left foot drop and for help normalize gait pattern and improve foot clearance for transfers and gait. Continued sessions to improve LLE strength, ROM, and control to reduce risk for falls    Personal Factors and Comorbidities Comorbidity 3+;Fitness;Time since onset of injury/illness/exacerbation    Comorbidities CVA's, fx foot    Examination-Activity Limitations Carry;Dressing;Lift;Locomotion Level;Squat;Stairs;Stand;Transfers    Examination-Participation Restrictions Cleaning;Community Activity;Meal Prep;Shop    Stability/Clinical Decision Making Evolving/Moderate complexity    Rehab Potential Good    PT Frequency 2x / week    PT Duration 6 weeks    PT Treatment/Interventions Gait training;Stair training;Functional mobility training;Therapeutic activities;Therapeutic exercise;Balance training;Patient/family education;Manual techniques;Passive range of motion    PT Next Visit Plan Continue with strengthening for bil LE's, gait and balance as well as PROM for LT ankle and toes    PT Home Exercise Plan Eval: toe spred, toe extension, ankle DF, bridge, sidelying abduction, Lt hip  flexion 9/20: sit to stand  9:22: towel inv/ev, toecrunch, standing heel raises, LAQ             Patient will benefit from skilled therapeutic intervention in order to improve the following deficits and impairments:  Abnormal gait, Decreased activity tolerance, Decreased balance, Decreased endurance, Decreased  mobility, Difficulty walking, Decreased strength, Increased fascial restricitons, Pain  Visit Diagnosis: Difficulty in walking, not elsewhere classified  Muscle weakness (generalized)  Pain in left ankle and joints of left foot     Problem List Patient Active Problem List   Diagnosis Date Noted   Spastic hemiparesis of left dominant side as late effect of cerebral infarction (HCC) 04/19/2017   Gait disturbance, post-stroke 04/18/2017   Adjustment disorder with depressed mood    Cerebral infarction due to thrombosis of right middle cerebral artery (HCC) 04/13/2017   Weakness    Acute ischemic stroke (HCC) 04/12/2017   Stroke (cerebrum) (HCC) 04/12/2017   Acute CVA (cerebrovascular accident) (HCC) 04/12/2017   CVA (cerebral vascular accident) (HCC) 03/20/2017   Hydronephrosis, left 03/20/2017   Aortic atherosclerosis (HCC) 03/20/2017   Hepatitis C 02/14/2017   Encounter for screening colonoscopy 02/14/2017   Spondylolisthesis at L3-L4 level 06/03/2014    Dion Body, PT 05/05/2021, 9:01 AM  [facility] [address] [place], [place], [address] Phone: [phone]   Fax:  [phone]  Name: DENNYS GUIN MRN: [id number] Date of Birth: Jul 22, 1956

## 2021-05-07 ENCOUNTER — Ambulatory Visit (HOSPITAL_COMMUNITY): Payer: Medicare Other

## 2021-05-11 ENCOUNTER — Other Ambulatory Visit: Payer: Self-pay

## 2021-05-11 ENCOUNTER — Encounter (HOSPITAL_COMMUNITY): Payer: Self-pay | Admitting: Physical Therapy

## 2021-05-11 ENCOUNTER — Ambulatory Visit (HOSPITAL_COMMUNITY): Payer: Medicare Other | Attending: Podiatry | Admitting: Physical Therapy

## 2021-05-11 DIAGNOSIS — R262 Difficulty in walking, not elsewhere classified: Secondary | ICD-10-CM | POA: Insufficient documentation

## 2021-05-11 DIAGNOSIS — M25572 Pain in left ankle and joints of left foot: Secondary | ICD-10-CM | POA: Insufficient documentation

## 2021-05-11 DIAGNOSIS — M6281 Muscle weakness (generalized): Secondary | ICD-10-CM | POA: Insufficient documentation

## 2021-05-11 NOTE — Therapy (Signed)
[facility] [address] [place], [place], [address] Phone: [phone]   Fax:  [phone]  Physical Therapy Treatment  Patient Details  Name: Brendan Charles MRN: [id number] Date of Birth: Jun 13, 1956 Referring Provider (PT): Erskine Emery   Encounter Date: 05/11/2021   PT End of Session - 05/11/21 0825     Visit Number 5    Number of Visits 12    Date for PT Re-Evaluation 06/03/21    Authorization Type medicare/AARP    Progress Note Due on Visit 10    PT Start Time 0819    PT Stop Time 0858    PT Time Calculation (min) 39 min    Activity Tolerance Patient tolerated treatment well    Behavior During Therapy [facility] for tasks assessed/performed             Past Medical History:  Diagnosis Date   Anxiety    Chronic back pain    CVA (cerebral vascular accident) (HCC) 03/20/2017   Hepatitis C    HEP C, never treated, 2007   Hypertension     Past Surgical History:  Procedure Laterality Date   CERVICAL DISC SURGERY  2001   anterior   COLONOSCOPY WITH PROPOFOL N/A 03/14/2017   Procedure: COLONOSCOPY WITH PROPOFOL;  Surgeon: West Bali, MD;  [facility];  Service: Endoscopy;  Laterality: N/A;  8:15am   CYSTOSCOPY W/ URETERAL STENT PLACEMENT Left 03/23/2017   Procedure: CYSTOSCOPY WITH LEFT RETROGRADE PYELOGRAM/URETERAL LEFT STENT PLACEMENT;  Surgeon: Malen Gauze, MD;  [facility];  Service: Urology;  Laterality: Left;   CYSTOSCOPY/RETROGRADE/URETEROSCOPY Left 07/17/2017   Procedure: CYSTOSCOPY/RETROGRADE/URETEROSCOPY, stent removal, stone extraction, stent replacement;  Surgeon: Malen Gauze, MD;  [facility];  Service: Urology;  Laterality: Left;   HOLMIUM LASER APPLICATION Left 07/17/2017   Procedure: HOLMIUM LASER APPLICATION;  Surgeon: Malen Gauze, MD;  [facility];  Service: Urology;  Laterality: Left;   LUMBAR SPINE SURGERY  2015   POLYPECTOMY  03/14/2017   Procedure:  POLYPECTOMY;  Surgeon: West Bali, MD;  [facility];  Service: Endoscopy;;  rectum   TEE WITHOUT CARDIOVERSION N/A 09/19/2017   Procedure: TRANSESOPHAGEAL ECHOCARDIOGRAM (TEE);  Surgeon: Antoine Poche, MD;  [facility];  Service: Endoscopy;  Laterality: N/A;    There were no vitals filed for this visit.   Subjective Assessment - 05/11/21 0824     Subjective Patient reports no new issues. He has ordered an AFO but not yet received it. No falls since last visit.    Currently in Pain? No/denies                               [facility] Adult PT Treatment/Exercise - 05/11/21 0001       Knee/Hip Exercises: Standing   Heel Raises Both;2 sets;10 reps    Hip Flexion Stengthening;Both;2 sets;10 reps    Hip Flexion Limitations 5 lb ankle weight; 6" step taps one set with weight one without    Hip Abduction Stengthening;Both;2 sets;10 reps    Abduction Limitations 5 lb ankle weights    Other Standing Knee Exercises tandem stance; 2 sets; 15 seconds each side      Knee/Hip Exercises: Seated   Long Arc Quad Strengthening;Both;2 sets;10 reps    Long Arc Quad Weight 5 lbs.    Sit to Sand 2 sets;5 reps;with UE support  PT Short Term Goals - 04/27/21 4008       PT SHORT TERM GOAL #1   Title PT to be I in HEP to improve LT LE strength.    Time 3    Period Weeks    Status On-going    Target Date 05/13/21      PT SHORT TERM GOAL #2   Title PT to be able to walk for 2 minutes without resting without having increased pain in Left foot    Time 3    Period Weeks    Status New               PT Long Term Goals - 04/27/21 6761       PT LONG TERM GOAL #1   Title Pt to be I in HEP to increase Lt LE strength by one grade to be able to come sit to stand with one UE assist    Time 6    Period Weeks    Status On-going      PT LONG TERM GOAL #2   Title PT to be able to single leg stance for 5 second on  both LE to reduce risk of falling    Time 6    Period Weeks    Status On-going      PT LONG TERM GOAL #3   Title PT to be able to walk for four minutes without resting with Rolling walker to assist son in getting pt to MD appointments.    Time 6    Period Weeks    Status On-going                   Plan - 05/11/21 0859     Clinical Impression Statement Patient tolerated session well overall. Patient continues to be limited by decreased activity tolerance and fatigues quickly with exercise. Patient given ample rest throughout session as needed. Introduced static balance progression with tandem stance. Patient was well challenged initially, but showed good improvement in stability with subsequent reps. Patient cued on purpose of added activity and on proper upright posturing with standing activity. Patient will continue to benefit from skilled therapy services to progress LE strength and balance for improved functional mobility and reduced risk for falls.    Personal Factors and Comorbidities Comorbidity 3+;Fitness;Time since onset of injury/illness/exacerbation    Comorbidities CVA's, fx foot    Examination-Activity Limitations Carry;Dressing;Lift;Locomotion Level;Squat;Stairs;Stand;Transfers    Examination-Participation Restrictions Cleaning;Community Activity;Meal Prep;Shop    Stability/Clinical Decision Making Evolving/Moderate complexity    Rehab Potential Good    PT Frequency 2x / week    PT Duration 6 weeks    PT Treatment/Interventions Gait training;Stair training;Functional mobility training;Therapeutic activities;Therapeutic exercise;Balance training;Patient/family education;Manual techniques;Passive range of motion    PT Next Visit Plan Continue with strengthening for bil LE's, gait and balance as well as PROM for LT ankle and toes    PT Home Exercise Plan Eval: toe spred, toe extension, ankle DF, bridge, sidelying abduction, Lt hip flexion 9/20: sit to stand  9:22: towel  inv/ev, toecrunch, standing heel raises, LAQ             Patient will benefit from skilled therapeutic intervention in order to improve the following deficits and impairments:  Abnormal gait, Decreased activity tolerance, Decreased balance, Decreased endurance, Decreased mobility, Difficulty walking, Decreased strength, Increased fascial restricitons, Pain  Visit Diagnosis: Difficulty in walking, not elsewhere classified  Muscle weakness (generalized)  Pain in left ankle and joints of  left foot     Problem List Patient Active Problem List   Diagnosis Date Noted   Spastic hemiparesis of left dominant side as late effect of cerebral infarction (HCC) 04/19/2017   Gait disturbance, post-stroke 04/18/2017   Adjustment disorder with depressed mood    Cerebral infarction due to thrombosis of right middle cerebral artery (HCC) 04/13/2017   Weakness    Acute ischemic stroke (HCC) 04/12/2017   Stroke (cerebrum) (HCC) 04/12/2017   Acute CVA (cerebrovascular accident) (HCC) 04/12/2017   CVA (cerebral vascular accident) (HCC) 03/20/2017   Hydronephrosis, left 03/20/2017   Aortic atherosclerosis (HCC) 03/20/2017   Hepatitis C 02/14/2017   Encounter for screening colonoscopy 02/14/2017   Spondylolisthesis at L3-L4 level 06/03/2014   9:00 AM, 05/11/21 Georges Lynch PT DPT  Physical Therapist with [place]  [facility]  [phone]   [facility] [address] [place], [place], [address] Phone: [phone]   Fax:  [phone]  Name: Brendan Charles MRN: [id number] Date of Birth: 1955/09/28

## 2021-05-13 ENCOUNTER — Other Ambulatory Visit: Payer: Self-pay

## 2021-05-13 ENCOUNTER — Ambulatory Visit (HOSPITAL_COMMUNITY): Payer: Medicare Other | Admitting: Physical Therapy

## 2021-05-13 DIAGNOSIS — M25572 Pain in left ankle and joints of left foot: Secondary | ICD-10-CM

## 2021-05-13 DIAGNOSIS — R262 Difficulty in walking, not elsewhere classified: Secondary | ICD-10-CM

## 2021-05-13 DIAGNOSIS — M6281 Muscle weakness (generalized): Secondary | ICD-10-CM

## 2021-05-13 NOTE — Therapy (Signed)
[facility] [address] [place], [place], [address] Phone: [phone]   Fax:  [phone]  Physical Therapy Treatment  Patient Details  Name: Brendan Charles MRN: [id number] Date of Birth: Aug 25, 1955 Referring Provider (PT): Erskine Emery   Encounter Date: 05/13/2021   PT End of Session - 05/13/21 0905     Visit Number 6    Number of Visits 12    Date for PT Re-Evaluation 06/03/21    Authorization Type medicare/[employer]    Progress Note Due on Visit 10    PT Start Time 0904    PT Stop Time 0945    PT Time Calculation (min) 41 min    Activity Tolerance Patient tolerated treatment well;Patient limited by fatigue    Behavior During Therapy [facility] for tasks assessed/performed             Past Medical History:  Diagnosis Date   Anxiety    Chronic back pain    CVA (cerebral vascular accident) (HCC) 03/20/2017   Hepatitis C    HEP C, never treated, 2007   Hypertension     Past Surgical History:  Procedure Laterality Date   CERVICAL DISC SURGERY  2001   anterior   COLONOSCOPY WITH PROPOFOL N/A 03/14/2017   Procedure: COLONOSCOPY WITH PROPOFOL;  Surgeon: West Bali, MD;  [facility];  Service: Endoscopy;  Laterality: N/A;  8:15am   CYSTOSCOPY W/ URETERAL STENT PLACEMENT Left 03/23/2017   Procedure: CYSTOSCOPY WITH LEFT RETROGRADE PYELOGRAM/URETERAL LEFT STENT PLACEMENT;  Surgeon: Malen Gauze, MD;  [facility]: [facility];  Service: Urology;  Laterality: Left;   CYSTOSCOPY/RETROGRADE/URETEROSCOPY Left 07/17/2017   Procedure: CYSTOSCOPY/RETROGRADE/URETEROSCOPY, stent removal, stone extraction, stent replacement;  Surgeon: Malen Gauze, MD;  [facility]: [facility];  Service: Urology;  Laterality: Left;   HOLMIUM LASER APPLICATION Left 07/17/2017   Procedure: HOLMIUM LASER APPLICATION;  Surgeon: Malen Gauze, MD;  [facility];  Service: Urology;  Laterality: Left;   LUMBAR SPINE SURGERY  2015    POLYPECTOMY  03/14/2017   Procedure: POLYPECTOMY;  Surgeon: West Bali, MD;  [facility];  Service: Endoscopy;;  rectum   TEE WITHOUT CARDIOVERSION N/A 09/19/2017   Procedure: TRANSESOPHAGEAL ECHOCARDIOGRAM (TEE);  Surgeon: Antoine Poche, MD;  [facility];  Service: Endoscopy;  Laterality: N/A;    There were no vitals filed for this visit.   Subjective Assessment - 05/13/21 0908     Subjective Patient states his leg is numb today. He notes he got some ankle weights for home exercise.    Currently in Pain? No/denies                               [facility] Adult PT Treatment/Exercise - 05/13/21 0001       Knee/Hip Exercises: Standing   Heel Raises Both;2 sets;10 reps    Knee Flexion Strengthening;Both;2 sets;10 reps    Knee Flexion Limitations 5 lb ankle weights    Hip Flexion Stengthening;Both;20 reps    Hip Flexion Limitations 5 lb ankle weight    Hip Abduction Stengthening;Both;20 reps    Abduction Limitations 5 lb ankle weights    Gait Training 100 ft down with walker    Other Standing Knee Exercises 6" step taps x 20 5lb ankle weights    Other Standing Knee Exercises Tandem stand 3x15 seconds HHAx1      Knee/Hip Exercises: Seated  Long Arc [employer] Strengthening;Both;20 reps    Long Arc [employer] 5 lbs.                       PT Short Term Goals - 04/27/21 2536       PT SHORT TERM GOAL #1   Title PT to be I in HEP to improve LT LE strength.    Time 3    Period Weeks    Status On-going    Target Date 05/13/21      PT SHORT TERM GOAL #2   Title PT to be able to walk for 2 minutes without resting without having increased pain in Left foot    Time 3    Period Weeks    Status New               PT Long Term Goals - 04/27/21 6440       PT LONG TERM GOAL #1   Title Pt to be I in HEP to increase Lt LE strength by one grade to be able to come sit to stand with one UE assist    Time 6    Period Weeks     Status On-going      PT LONG TERM GOAL #2   Title PT to be able to single leg stance for 5 second on both LE to reduce risk of falling    Time 6    Period Weeks    Status On-going      PT LONG TERM GOAL #3   Title PT to be able to walk for four minutes without resting with Rolling walker to assist son in getting pt to MD appointments.    Time 6    Period Weeks    Status On-going                   Plan - 05/13/21 1056     Clinical Impression Statement Patient continues to demo LE weakness and limited activity tolerance. Patient requires several breaks for rest throughout session. Patient did show improved ability with standing step taps. Patient well challenged with knee flexion on LT, requiring active assist for improved ROM. Patient demos ongoing difficulty with static balance during tandem stance. Patient required verbal cued for LLE ground clearance and upright posturing during gait using RW. Son reports AFO is coming this week. Patient will continue to benefit from skilled therapy services to progress LE strength and balance for improved functional mobility and reduced risk for falls.    Personal Factors and Comorbidities Comorbidity 3+;Fitness;Time since onset of injury/illness/exacerbation    Comorbidities CVA's, fx foot    Examination-Activity Limitations Carry;Dressing;Lift;Locomotion Level;Squat;Stairs;Stand;Transfers    Examination-Participation Restrictions Cleaning;Community Activity;Meal Prep;Shop    Stability/Clinical Decision Making Evolving/Moderate complexity    Rehab Potential Good    PT Frequency 2x / week    PT Duration 6 weeks    PT Treatment/Interventions Gait training;Stair training;Functional mobility training;Therapeutic activities;Therapeutic exercise;Balance training;Patient/family education;Manual techniques;Passive range of motion    PT Next Visit Plan Continue with strengthening for bil LE's, gait and balance as well as PROM for LT ankle and toes.  Sidestepping, gait with AFO    PT Home Exercise Plan Eval: toe spred, toe extension, ankle DF, bridge, sidelying abduction, Lt hip flexion 9/20: sit to stand  9:22: towel inv/ev, toecrunch, standing heel raises, LAQ             Patient will benefit from skilled therapeutic intervention  in order to improve the following deficits and impairments:  Abnormal gait, Decreased activity tolerance, Decreased balance, Decreased endurance, Decreased mobility, Difficulty walking, Decreased strength, Increased fascial restricitons, Pain  Visit Diagnosis: Difficulty in walking, not elsewhere classified  Pain in left ankle and joints of left foot  Muscle weakness (generalized)     Problem List Patient Active Problem List   Diagnosis Date Noted   Spastic hemiparesis of left dominant side as late effect of cerebral infarction (HCC) 04/19/2017   Gait disturbance, post-stroke 04/18/2017   Adjustment disorder with depressed mood    Cerebral infarction due to thrombosis of right middle cerebral artery (HCC) 04/13/2017   Weakness    Acute ischemic stroke (HCC) 04/12/2017   Stroke (cerebrum) (HCC) 04/12/2017   Acute CVA (cerebrovascular accident) (HCC) 04/12/2017   CVA (cerebral vascular accident) (HCC) 03/20/2017   Hydronephrosis, left 03/20/2017   Aortic atherosclerosis (HCC) 03/20/2017   Hepatitis C 02/14/2017   Encounter for screening colonoscopy 02/14/2017   Spondylolisthesis at L3-L4 level 06/03/2014   10:58 AM, 05/13/21 Georges Lynch PT DPT  Physical Therapist with [place]  [facility]  [phone]  [facility] [address] [place], [place], [address] Phone: [phone]   Fax:  [phone]  Name: HOSIE SHARMAN MRN: [id number] Date of Birth: [birth year]

## 2021-05-18 ENCOUNTER — Encounter (HOSPITAL_COMMUNITY): Payer: Self-pay | Admitting: Physical Therapy

## 2021-05-18 ENCOUNTER — Ambulatory Visit (HOSPITAL_COMMUNITY): Payer: Medicare Other | Admitting: Physical Therapy

## 2021-05-18 ENCOUNTER — Other Ambulatory Visit: Payer: Self-pay

## 2021-05-18 DIAGNOSIS — M25572 Pain in left ankle and joints of left foot: Secondary | ICD-10-CM

## 2021-05-18 DIAGNOSIS — R262 Difficulty in walking, not elsewhere classified: Secondary | ICD-10-CM | POA: Diagnosis not present

## 2021-05-18 DIAGNOSIS — M6281 Muscle weakness (generalized): Secondary | ICD-10-CM

## 2021-05-18 NOTE — Patient Instructions (Signed)
Access Code: [email or web address] URL: [email or web address] Date: 05/18/2021 Prepared by: Greig Castilla Destyni Hoppel  Exercises Side Stepping with Counter Support - 1 x daily - 7 x weekly - 4 sets - 8 reps Standing Knee Flexion AROM with Chair Support - 1 x daily - 7 x weekly - 3 sets - 10 reps

## 2021-05-18 NOTE — Therapy (Signed)
[facility] [address] [place], [place], [address] Phone: [phone]   Fax:  [phone]  Physical Therapy Treatment  Patient Details  Name: Brendan Charles MRN: [id number] Date of Birth: 04-26-1956 Referring Provider (PT): Erskine Emery   Encounter Date: 05/18/2021   PT End of Session - 05/18/21 0835     Visit Number 7    Number of Visits 12    Date for PT Re-Evaluation 06/03/21    Authorization Type medicare/AARP    Progress Note Due on Visit 10    PT Start Time 0835    PT Stop Time 0914    PT Time Calculation (min) 39 min    Activity Tolerance Patient tolerated treatment well;Patient limited by fatigue    Behavior During Therapy [facility] for tasks assessed/performed             Past Medical History:  Diagnosis Date   Anxiety    Chronic back pain    CVA (cerebral vascular accident) (HCC) 03/20/2017   Hepatitis C    HEP C, never treated, 2007   Hypertension     Past Surgical History:  Procedure Laterality Date   CERVICAL DISC SURGERY  2001   anterior   COLONOSCOPY WITH PROPOFOL N/A 03/14/2017   Procedure: COLONOSCOPY WITH PROPOFOL;  Surgeon: West Bali, MD;  [facility];  Service: Endoscopy;  Laterality: N/A;  8:15am   CYSTOSCOPY W/ URETERAL STENT PLACEMENT Left 03/23/2017   Procedure: CYSTOSCOPY WITH LEFT RETROGRADE PYELOGRAM/URETERAL LEFT STENT PLACEMENT;  Surgeon: Malen Gauze, MD;  [facility];  Service: Urology;  Laterality: Left;   CYSTOSCOPY/RETROGRADE/URETEROSCOPY Left 07/17/2017   Procedure: CYSTOSCOPY/RETROGRADE/URETEROSCOPY, stent removal, stone extraction, stent replacement;  Surgeon: Malen Gauze, MD;  [facility];  Service: Urology;  Laterality: Left;   HOLMIUM LASER APPLICATION Left 07/17/2017   Procedure: HOLMIUM LASER APPLICATION;  Surgeon: Malen Gauze, MD;  [facility];  Service: Urology;  Laterality: Left;   LUMBAR SPINE SURGERY  2015    POLYPECTOMY  03/14/2017   Procedure: POLYPECTOMY;  Surgeon: West Bali, MD;  [facility];  Service: Endoscopy;;  rectum   TEE WITHOUT CARDIOVERSION N/A 09/19/2017   Procedure: TRANSESOPHAGEAL ECHOCARDIOGRAM (TEE);  Surgeon: Antoine Poche, MD;  [facility];  Service: Endoscopy;  Laterality: N/A;    There were no vitals filed for this visit.   Subjective Assessment - 05/18/21 0836     Subjective Patient does not like his brace and it is uncomfortable. Today is his first day wearing it. He is sore all over.    Currently in Pain? No/denies                               [facility] Adult PT Treatment/Exercise - 05/18/21 0001       Knee/Hip Exercises: Standing   Heel Raises Both;2 sets;10 reps    Knee Flexion Strengthening;Both;2 sets;10 reps    Knee Flexion Limitations 5 lb ankle weights    Hip Flexion Stengthening;Both;20 reps    Hip Flexion Limitations 5 lb ankle weight    Hip Abduction Stengthening;Both;20 reps    Abduction Limitations 5 lb ankle weights    Other Standing Knee Exercises 6" step taps x 20 5lb ankle weights; lateral stepping 4x 8 feet    Other Standing Knee Exercises Tandem stand 3x15 seconds HHAx1      Knee/Hip Exercises: Seated  Long [employer] Strengthening;Both;20 reps    Long Arc [employer] 5 lbs.                     PT Education - 05/18/21 0836     Education Details HEP    Person(s) Educated Patient;Caregiver(s)    Methods Explanation    Comprehension Verbalized understanding              PT Short Term Goals - 04/27/21 0924       PT SHORT TERM GOAL #1   Title PT to be I in HEP to improve LT LE strength.    Time 3    Period Weeks    Status On-going    Target Date 05/13/21      PT SHORT TERM GOAL #2   Title PT to be able to walk for 2 minutes without resting without having increased pain in Left foot    Time 3    Period Weeks    Status New               PT Long Term Goals -  04/27/21 6578       PT LONG TERM GOAL #1   Title Pt to be I in HEP to increase Lt LE strength by one grade to be able to come sit to stand with one UE assist    Time 6    Period Weeks    Status On-going      PT LONG TERM GOAL #2   Title PT to be able to single leg stance for 5 second on both LE to reduce risk of falling    Time 6    Period Weeks    Status On-going      PT LONG TERM GOAL #3   Title PT to be able to walk for four minutes without resting with Rolling walker to assist son in getting pt to MD appointments.    Time 6    Period Weeks    Status On-going                   Plan - 05/18/21 0835     Clinical Impression Statement Patient ambulating with improving foot clearance with AFO. Patient more fatigued today compared to prior sessions. Continued with LE strengthening exercises today. Patient given intermittent cueing for mechanics of exercises and posture with fair carry over. Patient requires frequent seated rest breaks for fatigue. Patient will continue to benefit from skilled physical therapy in order to reduce impairment and improve function.    Personal Factors and Comorbidities Comorbidity 3+;Fitness;Time since onset of injury/illness/exacerbation    Comorbidities CVA's, fx foot    Examination-Activity Limitations Carry;Dressing;Lift;Locomotion Level;Squat;Stairs;Stand;Transfers    Examination-Participation Restrictions Cleaning;Community Activity;Meal Prep;Shop    Stability/Clinical Decision Making Evolving/Moderate complexity    Rehab Potential Good    PT Frequency 2x / week    PT Duration 6 weeks    PT Treatment/Interventions Gait training;Stair training;Functional mobility training;Therapeutic activities;Therapeutic exercise;Balance training;Patient/family education;Manual techniques;Passive range of motion    PT Next Visit Plan Continue with strengthening for bil LE's, gait and balance as well as PROM for LT ankle and toes. Sidestepping, gait with AFO     PT Home Exercise Plan Eval: toe spred, toe extension, ankle DF, bridge, sidelying abduction, Lt hip flexion 9/20: sit to stand  9:22: towel inv/ev, toecrunch, standing heel raises, LAQ             Patient will benefit  from skilled therapeutic intervention in order to improve the following deficits and impairments:  Abnormal gait, Decreased activity tolerance, Decreased balance, Decreased endurance, Decreased mobility, Difficulty walking, Decreased strength, Increased fascial restricitons, Pain  Visit Diagnosis: Difficulty in walking, not elsewhere classified  Pain in left ankle and joints of left foot  Muscle weakness (generalized)     Problem List Patient Active Problem List   Diagnosis Date Noted   Spastic hemiparesis of left dominant side as late effect of cerebral infarction (HCC) 04/19/2017   Gait disturbance, post-stroke 04/18/2017   Adjustment disorder with depressed mood    Cerebral infarction due to thrombosis of right middle cerebral artery (HCC) 04/13/2017   Weakness    Acute ischemic stroke (HCC) 04/12/2017   Stroke (cerebrum) (HCC) 04/12/2017   Acute CVA (cerebrovascular accident) (HCC) 04/12/2017   CVA (cerebral vascular accident) (HCC) 03/20/2017   Hydronephrosis, left 03/20/2017   Aortic atherosclerosis (HCC) 03/20/2017   Hepatitis C 02/14/2017   Encounter for screening colonoscopy 02/14/2017   Spondylolisthesis at L3-L4 level 06/03/2014    9:17 AM, 05/18/21 Wyman Songster PT, DPT Physical Therapist at [facility]   [facility] [address] [place], [place], [address] Phone: [phone]   Fax:  [phone]  Name: Brendan Charles MRN: [id number] Date of Birth: 1955/12/29

## 2021-05-20 ENCOUNTER — Encounter (HOSPITAL_COMMUNITY): Payer: Medicare Other

## 2021-05-25 ENCOUNTER — Other Ambulatory Visit: Payer: Self-pay

## 2021-05-25 ENCOUNTER — Ambulatory Visit (HOSPITAL_COMMUNITY): Payer: Medicare Other | Admitting: Physical Therapy

## 2021-05-25 DIAGNOSIS — R262 Difficulty in walking, not elsewhere classified: Secondary | ICD-10-CM

## 2021-05-25 DIAGNOSIS — M6281 Muscle weakness (generalized): Secondary | ICD-10-CM

## 2021-05-25 DIAGNOSIS — M25572 Pain in left ankle and joints of left foot: Secondary | ICD-10-CM

## 2021-05-25 NOTE — Therapy (Signed)
[facility] [address] [place], [place], [address] Phone: [phone]   Fax:  [phone]  Physical Therapy Treatment  Patient Details  Name: Brendan Charles MRN: [id number] Date of Birth: [birth year] Referring Provider (PT): Erskine Emery   Encounter Date: 05/25/2021   PT End of Session - 05/25/21 0919     Visit Number 8    Number of Visits 12    Date for PT Re-Evaluation 06/03/21    Authorization Type medicare/AARP    Progress Note Due on Visit 10    PT Start Time 0830    PT Stop Time 0915    PT Time Calculation (min) 45 min    Activity Tolerance Patient tolerated treatment well;Patient limited by fatigue    Behavior During Therapy [facility] for tasks assessed/performed             Past Medical History:  Diagnosis Date   Anxiety    Chronic back pain    CVA (cerebral vascular accident) (HCC) 03/20/2017   Hepatitis C    HEP C, never treated, 2007   Hypertension     Past Surgical History:  Procedure Laterality Date   CERVICAL DISC SURGERY  2001   anterior   COLONOSCOPY WITH PROPOFOL N/A 03/14/2017   Procedure: COLONOSCOPY WITH PROPOFOL;  Surgeon: West Bali, MD;  [facility];  Service: Endoscopy;  Laterality: N/A;  8:15am   CYSTOSCOPY W/ URETERAL STENT PLACEMENT Left 03/23/2017   Procedure: CYSTOSCOPY WITH LEFT RETROGRADE PYELOGRAM/URETERAL LEFT STENT PLACEMENT;  Surgeon: Malen Gauze, MD;  [facility];  Service: Urology;  Laterality: Left;   CYSTOSCOPY/RETROGRADE/URETEROSCOPY Left 07/17/2017   Procedure: CYSTOSCOPY/RETROGRADE/URETEROSCOPY, stent removal, stone extraction, stent replacement;  Surgeon: Malen Gauze, MD;  [facility];  Service: Urology;  Laterality: Left;   HOLMIUM LASER APPLICATION Left 07/17/2017   Procedure: HOLMIUM LASER APPLICATION;  Surgeon: Malen Gauze, MD;  [facility];  Service: Urology;  Laterality: Left;   LUMBAR SPINE SURGERY  2015    POLYPECTOMY  03/14/2017   Procedure: POLYPECTOMY;  Surgeon: West Bali, MD;  [facility];  Service: Endoscopy;;  rectum   TEE WITHOUT CARDIOVERSION N/A 09/19/2017   Procedure: TRANSESOPHAGEAL ECHOCARDIOGRAM (TEE);  Surgeon: Antoine Poche, MD;  [facility];  Service: Endoscopy;  Laterality: N/A;    There were no vitals filed for this visit.   Subjective Assessment - 05/25/21 0829     Subjective Mr. Vandevoorde states that his Lt arm and leg has been numb for about a week now.    Pertinent History 2 CVA's, fx LT foot    Limitations Walking;Standing    How long can you stand comfortably? 1 minute    How long can you walk comfortably? 1 minute    Patient Stated Goals to be able to walk without having pain in his Lt foot    Currently in Pain? --   numbness                              [facility] Adult PT Treatment/Exercise - 05/25/21 0001       Exercises   Exercises Ankle      Knee/Hip Exercises: Stretches   Gastroc Stretch Left;2 reps;60 seconds      Knee/Hip Exercises: Seated   Other Seated Knee/Hip Exercises toe spread, toe extension, towel crunches, ankle DF/PF over towel roll    Other Seated  Knee/Hip Exercises towel Inversion/eversion 2 minutes each    Sit to Sand 2 sets;5 reps;with UE support      Ankle Exercises: Standing   Heel Raises Limitations 10    Toe Raise Limitations 10    Other Standing Ankle Exercises single leg stance B x 5      Ankle Exercises: Seated   ABC's Limitations 1    Heel Raises Limitations 15    Toe Raise Limitations 15    BAPS Limitations level 2 dorsi/plantar; in/eversion; round the clock and counter clockwise x 10    Other Seated Ankle Exercises toe spreading                     PT Education - 05/25/21 0919     Education Details HEP    Person(s) Educated Patient    Methods Explanation;Handout    Comprehension Verbalized understanding              PT Short Term Goals - 04/27/21  0924       PT SHORT TERM GOAL #1   Title PT to be I in HEP to improve LT LE strength.    Time 3    Period Weeks    Status On-going    Target Date 05/13/21      PT SHORT TERM GOAL #2   Title PT to be able to walk for 2 minutes without resting without having increased pain in Left foot    Time 3    Period Weeks    Status New               PT Long Term Goals - 04/27/21 4403       PT LONG TERM GOAL #1   Title Pt to be I in HEP to increase Lt LE strength by one grade to be able to come sit to stand with one UE assist    Time 6    Period Weeks    Status On-going      PT LONG TERM GOAL #2   Title PT to be able to single leg stance for 5 second on both LE to reduce risk of falling    Time 6    Period Weeks    Status On-going      PT LONG TERM GOAL #3   Title PT to be able to walk for four minutes without resting with Rolling walker to assist son in getting pt to MD appointments.    Time 6    Period Weeks    Status On-going                   Plan - 05/25/21 0919     Clinical Impression Statement Added ankle strengthening exercises.  PT noted to have improved velocity with gait.  Started balance activity with pT.  Pt needs constant verbal cuing to keep body erect when ambulating and completing standing exercises.    Personal Factors and Comorbidities Comorbidity 3+;Fitness;Time since onset of injury/illness/exacerbation    Comorbidities CVA's, fx foot    Examination-Activity Limitations Carry;Dressing;Lift;Locomotion Level;Squat;Stairs;Stand;Transfers    Examination-Participation Restrictions Cleaning;Community Activity;Meal Prep;Shop    Stability/Clinical Decision Making Evolving/Moderate complexity    Rehab Potential Good    PT Frequency 2x / week    PT Duration 6 weeks    PT Treatment/Interventions Gait training;Stair training;Functional mobility training;Therapeutic activities;Therapeutic exercise;Balance training;Patient/family education;Manual  techniques;Passive range of motion    PT Next Visit Plan Continue with strengthening for bil  LE's, gait and balance as well as PROM for LT ankle and toes. Sidestepping, gait with AFO    PT Home Exercise Plan Eval: toe spred, toe extension, ankle DF, bridge, sidelying abduction, Lt hip flexion 9/20: sit to stand  9:22: towel inv/ev, toecrunch, standing heel raises, LAQ; 10/18:  ankle alphabet, sitting ankle inversion sidelying ankle eversion             Patient will benefit from skilled therapeutic intervention in order to improve the following deficits and impairments:  Abnormal gait, Decreased activity tolerance, Decreased balance, Decreased endurance, Decreased mobility, Difficulty walking, Decreased strength, Increased fascial restricitons, Pain  Visit Diagnosis: Difficulty in walking, not elsewhere classified  Pain in left ankle and joints of left foot  Muscle weakness (generalized)     Problem List Patient Active Problem List   Diagnosis Date Noted   Spastic hemiparesis of left dominant side as late effect of cerebral infarction (HCC) 04/19/2017   Gait disturbance, post-stroke 04/18/2017   Adjustment disorder with depressed mood    Cerebral infarction due to thrombosis of right middle cerebral artery (HCC) 04/13/2017   Weakness    Acute ischemic stroke (HCC) 04/12/2017   Stroke (cerebrum) (HCC) 04/12/2017   Acute CVA (cerebrovascular accident) (HCC) 04/12/2017   CVA (cerebral vascular accident) (HCC) 03/20/2017   Hydronephrosis, left 03/20/2017   Aortic atherosclerosis (HCC) 03/20/2017   Hepatitis C 02/14/2017   Encounter for screening colonoscopy 02/14/2017   Spondylolisthesis at L3-L4 level 06/03/2014   Virgina Organ, PT CLT [phone]  05/25/2021, 9:22 AM  [facility] [address] [place], [place], [address] Phone: [phone]   Fax:  [phone]  Name: ELLIAS MCELREATH MRN: [id number] Date of Birth:  01/11/1956

## 2021-05-27 ENCOUNTER — Telehealth (HOSPITAL_COMMUNITY): Payer: Self-pay | Admitting: Physical Therapy

## 2021-05-27 ENCOUNTER — Ambulatory Visit (HOSPITAL_COMMUNITY): Payer: Medicare Other | Admitting: Physical Therapy

## 2021-05-27 NOTE — Telephone Encounter (Signed)
No Show. Called and talked to patient about missed apt. Confirmed next apt.  8:48 AM, 05/27/21 Tereasa Coop, DPT Physical Therapy with [facility]  [phone] office

## 2021-06-01 ENCOUNTER — Encounter (HOSPITAL_COMMUNITY): Payer: Self-pay | Admitting: Physical Therapy

## 2021-06-01 ENCOUNTER — Other Ambulatory Visit: Payer: Self-pay

## 2021-06-01 ENCOUNTER — Ambulatory Visit (HOSPITAL_COMMUNITY): Payer: Medicare Other | Admitting: Physical Therapy

## 2021-06-01 DIAGNOSIS — R262 Difficulty in walking, not elsewhere classified: Secondary | ICD-10-CM | POA: Diagnosis not present

## 2021-06-01 DIAGNOSIS — M25572 Pain in left ankle and joints of left foot: Secondary | ICD-10-CM

## 2021-06-01 DIAGNOSIS — M6281 Muscle weakness (generalized): Secondary | ICD-10-CM

## 2021-06-01 NOTE — Patient Instructions (Signed)
Access Code: [email or web address] URL: [email or web address] Date: 06/01/2021 Prepared by: Georges Lynch  Exercises Heel Raises with Counter Support - 2 x daily - 7 x weekly - 2 sets - 10 reps Toe Raises with Counter Support - 2 x daily - 7 x weekly - 2 sets - 10 reps Standing Tandem Balance with Counter Support - 2 x daily - 7 x weekly - 1 sets - 4 reps - 20 second hold Side Stepping with Counter Support - 2 x daily - 7 x weekly - 2 sets - 5 reps

## 2021-06-01 NOTE — Therapy (Signed)
[facility] [address] [place], [place], [address] Phone: [phone]   Fax:  [phone]  Physical Therapy Treatment  Patient Details  Name: Brendan Charles MRN: [id number] Date of Birth: Feb 22, 1956 Referring Provider (PT): Erskine Emery   Encounter Date: 06/01/2021   PT End of Session - 06/01/21 0823     Visit Number 9    Number of Visits 12    Date for PT Re-Evaluation 06/03/21    Authorization Type medicare/[employer]    Progress Note Due on Visit 10    PT Start Time 0820    PT Stop Time 0858    PT Time Calculation (min) 38 min    Activity Tolerance Patient tolerated treatment well;Patient limited by fatigue    Behavior During Therapy [facility] for tasks assessed/performed             Past Medical History:  Diagnosis Date   Anxiety    Chronic back pain    CVA (cerebral vascular accident) (HCC) 03/20/2017   Hepatitis C    HEP C, never treated, 2007   Hypertension     Past Surgical History:  Procedure Laterality Date   CERVICAL DISC SURGERY  2001   anterior   COLONOSCOPY WITH PROPOFOL N/A 03/14/2017   Procedure: COLONOSCOPY WITH PROPOFOL;  Surgeon: West Bali, MD;  Location: [facility];  Service: Endoscopy;  Laterality: N/A;  8:15am   CYSTOSCOPY W/ URETERAL STENT PLACEMENT Left 03/23/2017   Procedure: CYSTOSCOPY WITH LEFT RETROGRADE PYELOGRAM/URETERAL LEFT STENT PLACEMENT;  Surgeon: Malen Gauze, MD;  Location: [facility];  Service: Urology;  Laterality: Left;   CYSTOSCOPY/RETROGRADE/URETEROSCOPY Left 07/17/2017   Procedure: CYSTOSCOPY/RETROGRADE/URETEROSCOPY, stent removal, stone extraction, stent replacement;  Surgeon: Malen Gauze, MD;  Location: [facility];  Service: Urology;  Laterality: Left;   HOLMIUM LASER APPLICATION Left 07/17/2017   Procedure: HOLMIUM LASER APPLICATION;  Surgeon: Malen Gauze, MD;  Location: [facility];  Service: Urology;  Laterality: Left;   LUMBAR SPINE SURGERY  2015    POLYPECTOMY  03/14/2017   Procedure: POLYPECTOMY;  Surgeon: West Bali, MD;  Location: [facility];  Service: Endoscopy;;  rectum   TEE WITHOUT CARDIOVERSION N/A 09/19/2017   Procedure: TRANSESOPHAGEAL ECHOCARDIOGRAM (TEE);  Surgeon: Antoine Poche, MD;  Location: [facility];  Service: Endoscopy;  Laterality: N/A;    There were no vitals filed for this visit.   Subjective Assessment - 06/01/21 0822     Subjective No new reports. Leg feels stringer, but knee is giving him trouble.    Pertinent History 2 CVA's, fx LT foot    Limitations Walking;Standing    How long can you stand comfortably? 1 minute    How long can you walk comfortably? 1 minute    Patient Stated Goals to be able to walk without having pain in his Lt foot    Currently in Pain? Yes    Pain Score 3     Pain Location Knee    Pain Orientation Left    Pain Descriptors / Indicators Aching    Pain Type Chronic pain                               [facility] Adult PT Treatment/Exercise - 06/01/21 0001       Knee/Hip Exercises: Standing   Heel Raises Both;2 sets;10 reps    Heel Raises Limitations 2 x 10 toe raises  Forward Step Up Both;2 sets;10 reps;Hand Hold: 1;Step Height: 4"    Other Standing Knee Exercises 4 inch step taps HHA x 1    Other Standing Knee Exercises staggered stance 3x15 seconds HHAx1, sidestepping in bars 4 minutes      Knee/Hip Exercises: Seated   Sit to Sand 1 set;10 reps;with UE support                       PT Short Term Goals - 04/27/21 3151       PT SHORT TERM GOAL #1   Title PT to be I in HEP to improve LT LE strength.    Time 3    Period Weeks    Status On-going    Target Date 05/13/21      PT SHORT TERM GOAL #2   Title PT to be able to walk for 2 minutes without resting without having increased pain in Left foot    Time 3    Period Weeks    Status New               PT Long Term Goals - 04/27/21 7616       PT LONG TERM GOAL #1    Title Pt to be I in HEP to increase Lt LE strength by one grade to be able to come sit to stand with one UE assist    Time 6    Period Weeks    Status On-going      PT LONG TERM GOAL #2   Title PT to be able to single leg stance for 5 second on both LE to reduce risk of falling    Time 6    Period Weeks    Status On-going      PT LONG TERM GOAL #3   Title PT to be able to walk for four minutes without resting with Rolling walker to assist son in getting pt to MD appointments.    Time 6    Period Weeks    Status On-going                   Plan - 06/01/21 0857     Clinical Impression Statement Patient tolerated session well today. Showing improved activity tolerance. Patient requires verbal cues for foot placements with step ups and balance activity, but shows improving functional ability. Discussed HEP updates in relevance to therapy goals. Issued handout. Patient will continue to benefit from skilled therapy services to reduce deficits and improve functional ability.    Personal Factors and Comorbidities Comorbidity 3+;Fitness;Time since onset of injury/illness/exacerbation    Comorbidities CVA's, fx foot    Examination-Activity Limitations Carry;Dressing;Lift;Locomotion Level;Squat;Stairs;Stand;Transfers    Examination-Participation Restrictions Cleaning;Community Activity;Meal Prep;Shop    Stability/Clinical Decision Making Evolving/Moderate complexity    Rehab Potential Good    PT Frequency 2x / week    PT Duration 6 weeks    PT Treatment/Interventions Gait training;Stair training;Functional mobility training;Therapeutic activities;Therapeutic exercise;Balance training;Patient/family education;Manual techniques;Passive range of motion    PT Next Visit Plan Reassess. Continue with strengthening for bil LE's, gait and balance as well as PROM for LT ankle and toes. Sidestepping, gait with AFO    PT Home Exercise Plan Eval: toe spred, toe extension, ankle DF, bridge,  sidelying abduction, Lt hip flexion 9/20: sit to stand  9:22: towel inv/ev, toecrunch, standing heel raises, LAQ; 10/18:  ankle alphabet, sitting ankle inversion sidelying ankle eversion 10/25 tandem stance, heel/ toe raise, sidestepping  Patient will benefit from skilled therapeutic intervention in order to improve the following deficits and impairments:  Abnormal gait, Decreased activity tolerance, Decreased balance, Decreased endurance, Decreased mobility, Difficulty walking, Decreased strength, Increased fascial restricitons, Pain  Visit Diagnosis: Difficulty in walking, not elsewhere classified  Pain in left ankle and joints of left foot  Muscle weakness (generalized)     Problem List Patient Active Problem List   Diagnosis Date Noted   Spastic hemiparesis of left dominant side as late effect of cerebral infarction (HCC) 04/19/2017   Gait disturbance, post-stroke 04/18/2017   Adjustment disorder with depressed mood    Cerebral infarction due to thrombosis of right middle cerebral artery (HCC) 04/13/2017   Weakness    Acute ischemic stroke (HCC) 04/12/2017   Stroke (cerebrum) (HCC) 04/12/2017   Acute CVA (cerebrovascular accident) (HCC) 04/12/2017   CVA (cerebral vascular accident) (HCC) 03/20/2017   Hydronephrosis, left 03/20/2017   Aortic atherosclerosis (HCC) 03/20/2017   Hepatitis C 02/14/2017   Encounter for screening colonoscopy 02/14/2017   Spondylolisthesis at L3-L4 level 06/03/2014   9:03 AM, 06/01/21 Brendan Charles PT DPT  Physical Therapist with [place]  [facility]  [phone]   [facility] [address] [place], [place], [address] Phone: [phone]   Fax:  [phone]  Name: Brendan Charles MRN: [id number] Date of Birth: [birth year]

## 2021-06-03 ENCOUNTER — Ambulatory Visit (HOSPITAL_COMMUNITY): Payer: Medicare Other | Admitting: Physical Therapy

## 2021-07-09 NOTE — Progress Notes (Signed)
NEUROLOGY FOLLOW UP OFFICE NOTE  Brendan Charles [id number]  Assessment/Plan:   Multiple strokes and TIAs secondary to small vessel disease Residual left sided spastic hemiparesis and left sided hemisensory loss as late effect of prior stroke Hypertension Hyperlipidemia  1.  Secondary stroke prevention as managed by PCP: -  Plavix 75mg  daily -  Atorvastatin 80mg  daily.  LDL goal less than 70 -  Blood pressure management -  Glycemic control. Hgb A1c goal less than 7 2.  Follow up as needed  Subjective:  Brendan Charles is a 65 year old right-handed male with tobacco use, hypertension and history of prior strokes and hepatitis C who follows up for stroke and TIAs.  He is accompanied by his son who supplements history.   UPDATE: Current medications include Plavix, losartan, metoprolol and atorvastatin 80 mg daily.   He had PT for the left side for a couple of months.      HISTORY: He was admitted to [facility] from 03/20/17 to 03/21/17 for stroke.  He underwent colonscopy a week prior to admission and developed slurred speech.  A few days later, he developed right sided weakness and speech difficulty but did not seek immediate medical attention.  The following day, he developed left lower quadrant pain and then went to the ED.  MRI of brain revealed acute left basal ganglia ischemic infarct extending towards the corona radiata with associated high-grade stenosis or occlusion of the anterior inferior left M2 segment on MRA.  It also revealed moderate to high-grade stenosis of the proximal right vertebral artery with distal flow beyond the PICA as well as 1.5 mm aneurysm or more likely infundibulum of the left PCOM artery.  Carotid doppler revealed small plaque in left ICA but no hemodynamically significant ICA stenosis bilaterally.  TTE showed EF 60-65% with no atrial septal defect/PFO or other cardiac source of emboli.  LDL was 89, Hgb A1c 5.5, HIV antibody negative, RPR negative,  TSH 2.892, and B12 318.  He was started on ASA 325mg  and Plavix 75mg  and atorvastatin 80mg .     He was readmitted to [facility] from 04/12/17 to 04/13/17 for sudden onset left upper extremity numbness and weakness.  Due to recent stroke, he was not tPA candidate.  MRI of brain revealed acute right medullary lacunar ischemic infarct.  CTA of head and neck again revealed severe tandem right V4 vertebral artery stenosis proximal to PICA origin but with patent carotid arteries and intracranial vasculature.  He was continued on dual antiplatelet therapy with Lipitor 80mg .  He was discharged to rehab for 1 week.  He again presented to the ED on 05/18/17 after waking up in the morning with new left sided numbness and weakness.  CT of head showed chronic lacunar infarcts but nothing acute.  MRI of brain showed expected evolution of his recent left basal ganglia/corona radiata and right medullary infarcts but no acute findings.  MRA of head was again notable for unchanged severe tandem right V4 vertebral artery stenosis.   TEE from 09/19/17 demonstratee LV EF 55-60% with no PFO or atrial septal defect or evidence of intracardiac thrombus.  It did reveal questionable semi-mobile plaque in the proximal ascending aorta. Follow up CTA of chest from 12/22/17 showed no abnormality to correspond with finding on the TEE.  LDL from 08/28/17 was 68.   In July 2019, he endorsed 1 to 2 months of worsening left-sided numbness.  MRI of the brain from 03/02/2018 was personally reviewed and again demonstrated advanced  chronic small vessel ischemic changes but no acute infarct.  24-hour Holter monitor was again ordered but not performed.  He went to the ED on 03/02/2019 after waking up with twitching of his eyes, blurred vision and increased left upper and lower extremity numbness.  MRI of brain personally reviewed showed no acute stroke or other acute intracranial abnormalities.  CTA of head and neck personally reviewed showed  atherosclerosis with known high-grade left vertebral origin and right V4 segment stenoses but no emergent findings.   PAST MEDICAL HISTORY: Past Medical History:  Diagnosis Date   Anxiety    Chronic back pain    CVA (cerebral vascular accident) (HCC) 03/20/2017   Hepatitis C    HEP C, never treated, 2007   Hypertension     MEDICATIONS: Current Outpatient Medications on File Prior to Visit  Medication Sig Dispense Refill   amLODipine (NORVASC) 5 MG tablet Take 5 mg by mouth daily.  11   aspirin EC 81 MG tablet Take 81 mg by mouth daily. (Patient not taking: Reported on 07/10/2020)     atorvastatin (LIPITOR) 80 MG tablet TAKE ONE TABLET BY MOUTH EVERY EVENING 30 tablet 4   clopidogrel (PLAVIX) 75 MG tablet Take 1 tablet (75 mg total) by mouth daily with breakfast. 30 tablet 5   escitalopram (LEXAPRO) 10 MG tablet Take 10 mg by mouth daily.  11   losartan (COZAAR) 100 MG tablet Take 100 mg by mouth daily.  11   metoprolol tartrate (LOPRESSOR) 25 MG tablet Take 1 tablet (25 mg total) by mouth 2 (two) times daily. 60 tablet 0   oxyCODONE-acetaminophen (PERCOCET) 7.5-325 MG tablet Take 1 tablet by mouth every 4 (four) hours as needed for severe pain. 30 tablet 0   No current facility-administered medications on file prior to visit.    ALLERGIES: Allergies  Allergen Reactions   Codeine Itching and Nausea Only    FAMILY HISTORY: Family History  Problem Relation Age of Onset   Cancer Mother    Dementia Sister    Colon cancer Neg Hx       Objective:  Blood pressure 139/85, pulse 74, height 6' (1.829 m), weight 170 lb (77.1 kg), SpO2 95 %. General: No acute distress.  Patient appears well-groomed.   Head:  Normocephalic/atraumatic Eyes:  Fundi examined but not visualized Neck: supple, no paraspinal tenderness, full range of motion Heart:  Regular rate and rhythm Lungs:  Clear to auscultation bilaterally Back: No paraspinal tenderness Neurological Exam: alert and oriented to  person, place, and time.  Speech fluent and not dysarthric, language intact.  Mild left lower facial weakness.  Otherwise, CN II-XII intact. Mild increased tone on left, muscle strength 4+/5 left upper and lower extremities, 5/5 on right  Sensation to pinprick reduced in left upper and lower extremities.  Vibratory sensation intact.  Deep tendon reflexes 3+ left upper and lower extremities, 2+ right sided, toes downgoing  Finger to nose testing intact.  Unsteady.  Requires walker to ambulate.   Shon Millet, DO  CC: Elfredia Nevins, MD

## 2021-07-12 ENCOUNTER — Other Ambulatory Visit: Payer: Self-pay

## 2021-07-12 ENCOUNTER — Ambulatory Visit (INDEPENDENT_AMBULATORY_CARE_PROVIDER_SITE_OTHER): Payer: Medicare Other | Admitting: Neurology

## 2021-07-12 ENCOUNTER — Encounter: Payer: Self-pay | Admitting: Neurology

## 2021-07-12 VITALS — BP 139/85 | HR 74 | Ht 72.0 in | Wt 170.0 lb

## 2021-07-12 DIAGNOSIS — I69352 Hemiplegia and hemiparesis following cerebral infarction affecting left dominant side: Secondary | ICD-10-CM

## 2021-07-12 DIAGNOSIS — E785 Hyperlipidemia, unspecified: Secondary | ICD-10-CM

## 2021-07-12 DIAGNOSIS — I10 Essential (primary) hypertension: Secondary | ICD-10-CM

## 2021-07-12 DIAGNOSIS — I63311 Cerebral infarction due to thrombosis of right middle cerebral artery: Secondary | ICD-10-CM

## 2021-07-12 NOTE — Patient Instructions (Signed)
Continue  Clopidogrel 75mg  daily Atorvastatin Losartan Mediterranean diet Smoking cessation Follow up as needed  Mediterranean Diet A Mediterranean diet refers to food and lifestyle choices that are based on the traditions of countries located on the . It focuses on eating more fruits, vegetables, whole grains, beans, nuts, seeds, and heart-healthy fats, and eating less dairy, meat, eggs, and processed foods with added sugar, salt, and fat. This way of eating has been shown to help prevent certain conditions and improve outcomes for people who have chronic diseases, like kidney disease and heart disease. What are tips for following this plan? Reading food labels Check the serving size of packaged foods. For foods such as rice and pasta, the serving size refers to the amount of cooked product, not dry. Check the total fat in packaged foods. Avoid foods that have saturated fat or trans fats. Check the ingredient list for added sugars, such as corn syrup. Shopping  Buy a variety of foods that offer a balanced diet, including: Fresh fruits and vegetables (produce). Grains, beans, nuts, and seeds. Some of these may be available in unpackaged forms or large amounts (in bulk). Fresh seafood. Poultry and eggs. Low-fat dairy products. Buy whole ingredients instead of prepackaged foods. Buy fresh fruits and vegetables in-season from local farmers markets. Buy plain frozen fruits and vegetables. If you do not have access to quality fresh seafood, buy precooked frozen shrimp or canned fish, such as tuna, salmon, or sardines. Stock your pantry so you always have certain foods on hand, such as olive oil, canned tuna, canned tomatoes, rice, pasta, and beans. Cooking Cook foods with extra-virgin olive oil instead of using butter or other vegetable oils. Have meat as a side dish, and have vegetables or grains as your main dish. This means having meat in small portions or adding small  amounts of meat to foods like pasta or stew. Use beans or vegetables instead of meat in common dishes like chili or lasagna. Experiment with different cooking methods. Try roasting, broiling, steaming, and sauting vegetables. Add frozen vegetables to soups, stews, pasta, or rice. Add nuts or seeds for added healthy fats and plant protein at each meal. You can add these to yogurt, salads, or vegetable dishes. Marinate fish or vegetables using olive oil, lemon juice, garlic, and fresh herbs. Meal planning Plan to eat one vegetarian meal one day each week. Try to work up to two vegetarian meals, if possible. Eat seafood two or more times a week. Have healthy snacks readily available, such as: Vegetable sticks with hummus. Greek yogurt. Fruit and nut trail mix. Eat balanced meals throughout the week. This includes: Fruit: 2-3 servings a day. Vegetables: 4-5 servings a day. Low-fat dairy: 2 servings a day. Fish, poultry, or lean meat: 1 serving a day. Beans and legumes: 2 or more servings a week. Nuts and seeds: 1-2 servings a day. Whole grains: 6-8 servings a day. Extra-virgin olive oil: 3-4 servings a day. Limit red meat and sweets to only a few servings a month. Lifestyle  Cook and eat meals together with your family, when possible. Drink enough fluid to keep your urine pale yellow. Be physically active every day. This includes: Aerobic exercise like running or swimming. Leisure activities like gardening, walking, or housework. Get 7-8 hours of sleep each night. If recommended by your health care provider, drink red wine in moderation. This means 1 glass a day for nonpregnant women and 2 glasses a day for men. A glass of wine equals 5  oz (150 mL). What foods should I eat? Fruits Apples. Apricots. Avocado. Berries. Bananas. Cherries. Dates. Figs. Grapes. Lemons. Melon. Oranges. Peaches. Plums. Pomegranate. Vegetables Artichokes. Beets. Broccoli. Cabbage. Carrots. Eggplant. Green  beans. Chard. Kale. Spinach. Onions. Leeks. Peas. Squash. Tomatoes. Peppers. Radishes. Grains Whole-grain pasta. Brown rice. Bulgur wheat. Polenta. Couscous. Whole-wheat bread. Orpah Cobb. Meats and other proteins Beans. Almonds. Sunflower seeds. Pine nuts. Peanuts. Cod. Salmon. Scallops. Shrimp. Tuna. Tilapia. Clams. Oysters. Eggs. Poultry without skin. Dairy Low-fat milk. Cheese. Greek yogurt. Fats and oils Extra-virgin olive oil. Avocado oil. Grapeseed oil. Beverages Water. Red wine. Herbal tea. Sweets and desserts Greek yogurt with honey. Baked apples. Poached pears. Trail mix. Seasonings and condiments Basil. Cilantro. Coriander. Cumin. Mint. Parsley. Sage. Rosemary. Tarragon. Garlic. Oregano. Thyme. Pepper. Balsamic vinegar. Tahini. Hummus. Tomato sauce. Olives. Mushrooms. The items listed above may not be a complete list of foods and beverages you can eat. Contact a dietitian for more information. What foods should I limit? This is a list of foods that should be eaten rarely or only on special occasions. Fruits Fruit canned in syrup. Vegetables Deep-fried potatoes (french fries). Grains Prepackaged pasta or rice dishes. Prepackaged cereal with added sugar. Prepackaged snacks with added sugar. Meats and other proteins Beef. Pork. Lamb. Poultry with skin. Hot dogs. Tomasa Blase. Dairy Ice cream. Sour cream. Whole milk. Fats and oils Butter. Canola oil. Vegetable oil. Beef fat (tallow). Lard. Beverages Juice. Sugar-sweetened soft drinks. Beer. Liquor and spirits. Sweets and desserts Cookies. Cakes. Pies. Candy. Seasonings and condiments Mayonnaise. Pre-made sauces and marinades. The items listed above may not be a complete list of foods and beverages you should limit. Contact a dietitian for more information. Summary The Mediterranean diet includes both food and lifestyle choices. Eat a variety of fresh fruits and vegetables, beans, nuts, seeds, and whole grains. Limit the  amount of red meat and sweets that you eat. If recommended by your health care provider, drink red wine in moderation. This means 1 glass a day for nonpregnant women and 2 glasses a day for men. A glass of wine equals 5 oz (150 mL). This information is not intended to replace advice given to you by your health care provider. Make sure you discuss any questions you have with your health care provider. Document Revised: 08/30/2019 Document Reviewed: 06/27/2019 Elsevier Patient Education  2022 [employer].

## 2022-05-24 ENCOUNTER — Ambulatory Visit (INDEPENDENT_AMBULATORY_CARE_PROVIDER_SITE_OTHER): Payer: Medicare Other | Admitting: Internal Medicine

## 2022-05-24 ENCOUNTER — Other Ambulatory Visit (HOSPITAL_COMMUNITY)
Admission: RE | Admit: 2022-05-24 | Discharge: 2022-05-24 | Disposition: A | Discharge status: Home / Self Care | Payer: Medicare Other | Source: Ambulatory Visit | Attending: Internal Medicine | Admitting: Internal Medicine

## 2022-05-24 ENCOUNTER — Encounter: Payer: Self-pay | Admitting: Internal Medicine

## 2022-05-24 VITALS — BP 122/76 | HR 88 | Ht 70.0 in | Wt 174.0 lb

## 2022-05-24 DIAGNOSIS — I10 Essential (primary) hypertension: Secondary | ICD-10-CM | POA: Insufficient documentation

## 2022-05-24 DIAGNOSIS — R079 Chest pain, unspecified: Secondary | ICD-10-CM | POA: Insufficient documentation

## 2022-05-24 DIAGNOSIS — I5043 Acute on chronic combined systolic (congestive) and diastolic (congestive) heart failure: Secondary | ICD-10-CM | POA: Insufficient documentation

## 2022-05-24 DIAGNOSIS — I209 Angina pectoris, unspecified: Secondary | ICD-10-CM

## 2022-05-24 LAB — COMPREHENSIVE METABOLIC PANEL
ALT: 39 U/L (ref 0–44)
AST: 25 U/L (ref 15–41)
Albumin: 4.4 g/dL (ref 3.5–5.0)
Alkaline Phosphatase: 120 U/L (ref 38–126)
Anion gap: 9 (ref 5–15)
BUN: 24 mg/dL — ABNORMAL HIGH (ref 8–23)
CO2: 25 mmol/L (ref 22–32)
Calcium: 9.6 mg/dL (ref 8.9–10.3)
Chloride: 108 mmol/L (ref 98–111)
Creatinine, Ser: 0.98 mg/dL (ref 0.61–1.24)
GFR, Estimated: >60 mL/min (ref >60)
Glucose, Bld: 101 mg/dL — ABNORMAL HIGH (ref 70–99)
Potassium: 4.3 mmol/L (ref 3.5–5.1)
Sodium: 142 mmol/L (ref 135–145)
Total Bilirubin: 0.8 mg/dL (ref 0.3–1.2)
Total Protein: 7.9 g/dL (ref 6.5–8.1)

## 2022-05-24 LAB — CBC
HCT: 45.3 % (ref 39.0–52.0)
Hemoglobin: 15.3 g/dL (ref 13.0–17.0)
MCH: 31.5 pg (ref 26.0–34.0)
MCHC: 33.8 g/dL (ref 30.0–36.0)
MCV: 93.4 fL (ref 80.0–100.0)
Platelets: 315 10*3/uL (ref 150–400)
RBC: 4.85 MIL/uL (ref 4.22–5.81)
RDW: 13.3 % (ref 11.5–15.5)
WBC: 9.7 10*3/uL (ref 4.0–10.5)
nRBC: 0 % (ref 0.0–0.2)

## 2022-05-24 LAB — LIPID PANEL
Cholesterol: 99 mg/dL (ref 0–200)
HDL: 39 mg/dL — ABNORMAL LOW (ref >40)
LDL Cholesterol: 49 mg/dL (ref 0–99)
Total CHOL/HDL Ratio: 2.5 RATIO
Triglycerides: 55 mg/dL (ref <150)
VLDL: 11 mg/dL (ref 0–40)

## 2022-05-24 MED ORDER — NITROGLYCERIN 0.4 MG SL SUBL: 0.4000 mg | SUBLINGUAL_TABLET | SUBLINGUAL | 3 refills | Status: DC | PRN

## 2022-05-24 MED ORDER — FUROSEMIDE 20 MG PO TABS: 20.0000 mg | ORAL_TABLET | Freq: Every day | ORAL | 3 refills | Status: DC

## 2022-05-24 MED ORDER — ISOSORBIDE MONONITRATE ER 30 MG PO TB24: 30.0000 mg | ORAL_TABLET | Freq: Every day | ORAL | 3 refills | Status: DC

## 2022-05-24 NOTE — Progress Notes (Signed)
Cardiology Office Note  Date: 05/24/2022   ID: RANDEEP BIONDOLILLO, DOB 02-14-1956, MRN [id number]  PCP:  Redmond School, MD  Cardiologist:  Chalmers Guest, MD Electrophysiologist:  None   Reason for Office Visit: Evaluation of chest pressure at the request of Dr. Riley Kill  History of Present Illness: SHONTA PHILLIS is a 66 y.o. male known to have multiple strokes and TIAs, HTN, HLD was referred for evaluation of chest pressure at the request of Dr. Riley Kill. Patient has been having substernal non-radiating substernal chest pressure with minimal exertion x 2 months, 2 times per week, lasting for 15 minutes to one hour in duration, worsened with minimal exertion and relieved with rest.  Associated with SOB and productive cough, especially at night. Denied any dizziness, lightheadedness, syncope, LE swelling and claudication.  Former tobacco smoker, quit after he was diagnosed with stroke. However he had secondhand exposure to smoke through his family recently but not currently.  Not physically active at baseline. He walks with a walker and perform his daily activities.  Compliant with medications and no side effects.  No bleeding complications.  No prior history of MI/PCI/CABG. For his multiple strokes and TIAs in the past it was thought to be secondary to small vessel disease  Past Medical History:  Diagnosis Date   Anxiety    Chronic back pain    CVA (cerebral vascular accident) ([place]) 03/20/2017   Hepatitis C    HEP C, never treated, 2007   Hypertension     Past Surgical History:  Procedure Laterality Date   CERVICAL DISC SURGERY  2001   anterior   COLONOSCOPY WITH PROPOFOL N/A 03/14/2017   Procedure: COLONOSCOPY WITH PROPOFOL;  Surgeon: Danie Binder, MD;  [facility];  Service: Endoscopy;  Laterality: N/A;  8:15am   CYSTOSCOPY W/ URETERAL STENT PLACEMENT Left 03/23/2017   Procedure: CYSTOSCOPY WITH LEFT RETROGRADE PYELOGRAM/URETERAL LEFT STENT PLACEMENT;  Surgeon:  Cleon Gustin, MD;  Location: [facility];  Service: Urology;  Laterality: Left;   CYSTOSCOPY/RETROGRADE/URETEROSCOPY Left 07/17/2017   Procedure: CYSTOSCOPY/RETROGRADE/URETEROSCOPY, stent removal, stone extraction, stent replacement;  Surgeon: Cleon Gustin, MD;  Location: [facility];  Service: Urology;  Laterality: Left;   HOLMIUM LASER APPLICATION Left 85/09/7739   Procedure: HOLMIUM LASER APPLICATION;  Surgeon: Cleon Gustin, MD;  Location: [facility];  Service: Urology;  Laterality: Left;   LUMBAR SPINE SURGERY  2015   POLYPECTOMY  03/14/2017   Procedure: POLYPECTOMY;  Surgeon: Danie Binder, MD;  [facility];  Service: Endoscopy;;  rectum   TEE WITHOUT CARDIOVERSION N/A 09/19/2017   Procedure: TRANSESOPHAGEAL ECHOCARDIOGRAM (TEE);  Surgeon: Arnoldo Lenis, MD;  [facility];  Service: Endoscopy;  Laterality: N/A;    Current Outpatient Medications  Medication Sig Dispense Refill   amLODipine (NORVASC) 5 MG tablet Take 5 mg by mouth daily.  11   atorvastatin (LIPITOR) 80 MG tablet TAKE ONE TABLET BY MOUTH EVERY EVENING 30 tablet 4   clopidogrel (PLAVIX) 75 MG tablet Take 1 tablet (75 mg total) by mouth daily with breakfast. 30 tablet 5   escitalopram (LEXAPRO) 10 MG tablet Take 10 mg by mouth daily.  11   furosemide (LASIX) 20 MG tablet Take 1 tablet (20 mg total) by mouth daily. 90 tablet 3   isosorbide mononitrate (IMDUR) 30 MG 24 hr tablet Take 1 tablet (30 mg total) by mouth daily. 90 tablet 3   losartan (COZAAR) 100 MG tablet Take 100 mg by  mouth daily.  11   metoprolol tartrate (LOPRESSOR) 25 MG tablet Take 1 tablet (25 mg total) by mouth 2 (two) times daily. 60 tablet 0   nitroGLYCERIN (NITROSTAT) 0.4 MG SL tablet Place 1 tablet (0.4 mg total) under the tongue every 5 (five) minutes as needed for chest pain (up to 3 doses). 90 tablet 3   omeprazole (PRILOSEC) 40 MG capsule Take 40 mg by mouth daily.     oxyCODONE-acetaminophen (PERCOCET) 7.5-325 MG  tablet Take 1 tablet by mouth every 4 (four) hours as needed for severe pain. (Patient not taking: Reported on 05/24/2022) 30 tablet 0   No current facility-administered medications for this visit.   Allergies:  Codeine   Social History: The patient  reports that he quit smoking about 5 years ago. His smoking use included cigarettes. He has a 22.50 pack-year smoking history. He has never used smokeless tobacco. He reports that he does not drink alcohol and does not use drugs.   Family History: The patient's family history includes Cancer in his mother; Dementia in his sister.   ROS:  Please see the history of present illness. Otherwise, complete review of systems is positive for none.  All other systems are reviewed and negative.   Physical Exam: VS:  BP 122/76   Pulse 88   Ht 5\' 10"  (1.778 m)   Wt 174 lb (78.9 kg)   SpO2 98%   BMI 24.97 kg/m , BMI Body mass index is 24.97 kg/m.  Wt Readings from Last 3 Encounters:  05/24/22 174 lb (78.9 kg)  07/12/21 170 lb (77.1 kg)  07/10/20 153 lb 9.6 oz (69.7 kg)    General: Patient appears comfortable at rest. HEENT: Conjunctiva and lids normal, oropharynx clear with moist mucosa. Neck: Supple, no thyromegaly. Lungs: Bibasilar rales + Cardiac: Regular rate and rhythm, no S3 or significant systolic murmur, no pericardial rub. Abdomen: Soft, nontender, no hepatomegaly, bowel sounds present, no guarding or rebound. Extremities: No pitting edema, distal pulses 2+. Skin: Warm and dry. Musculoskeletal: No kyphosis. Neuropsychiatric: Alert and oriented x3, affect grossly appropriate.  ECG:  An ECG dated recent was personally reviewed today and demonstrated:  NSR and no ST-T changes  Recent Labwork: No results found for requested labs within last 365 days.     Component Value Date/Time   CHOL 131 08/28/2017 1013   TRIG 61 08/28/2017 1013   HDL 51 08/28/2017 1013   CHOLHDL 3.9 03/21/2017 0509   VLDL 16 03/21/2017 0509   LDLCALC 68  08/28/2017 1013    Other Studies Reviewed Today: Echo in 2019 Study Conclusions  - Left ventricle: Systolic function was normal. The estimated    ejection fraction was in the range of 55% to 60%.  - Aortic valve: Mildly calcified annulus. Mildly thickened    leaflets.  - Atrial septum: No defect or patent foramen ovale was identified.    Echo contrast study (bubble study) showed no right-to-left atrial    level shunt, following an increase in RA pressure induced by    provocative maneuvers.  - No evidence of intracardiac thrombus. Questionable semi-mobile    plaque in the proximal ascending aorta, consider CTA to better    define in the setting of CVA.   USG Carotids in 2018 IMPRESSION: Small amount of plaque at the level of the left carotid bulb. No evidence of plaque in either internal carotid artery or evidence of ICA stenosis bilaterally.  Assessment and Plan: Patient is a [age] year old male known to  have HTN, HLD, multiple strokes and TIAs presented to the cardiology clinic for evaluation of chest pressure.  Accompanied by son.  #Cardiac chest pain PLAN -Patient has been having substernal non-radiating chest pressure with minimal exertion x 2 months, 2 times per week, lasting for 15 minutes to one hour in duration, worsened with minimal exertion and relieved with rest. -Obtain Coronary CTA with FFR. If positive, patient willing to undergo LHC. -Start PO Imdur 30mg  once daily and SL NTG 0.4mg  PRN -ER precautions for chest pain provided. -Obtain routine labs today  #Productive cough at night likely secondary to pulmonary congestion from ?HF PLAN -Start PO Lasix 20mg  once daily due to wet cough at night  #Multiple strokes and TIAs history PLAN -Continue current cardio prudent medications with plavix 75mg  once daily and atorvastatin 80 mg nightly.  LDL goal less than 70. -We will review the chart in detail if patient will benefit from loop recorder implant implantation in  the future for A-fib surveillance.  #HTN, controlled PLAN -Continue BB and ARB  I have spent a total of 45 minutes with patient reviewing chart , telemetry, EKGs, labs and examining patient as well as establishing an assessment and plan that was discussed with the patient.  > 50% of time was spent in direct patient care.     Medication Adjustments/Labs and Tests Ordered: Current medicines are reviewed at length with the patient today.  Concerns regarding medicines are outlined above.   Tests Ordered: Orders Placed This Encounter  Procedures   CT CORONARY MORPH W/CTA COR W/SCORE W/CA W/CM &/OR WO/CM   CBC   Comprehensive Metabolic Panel (CMET)   Lipid panel   EKG 12-Lead   ECHOCARDIOGRAM COMPLETE    Medication Changes: Meds ordered this encounter  Medications   isosorbide mononitrate (IMDUR) 30 MG 24 hr tablet    Sig: Take 1 tablet (30 mg total) by mouth daily.    Dispense:  90 tablet    Refill:  3   furosemide (LASIX) 20 MG tablet    Sig: Take 1 tablet (20 mg total) by mouth daily.    Dispense:  90 tablet    Refill:  3   nitroGLYCERIN (NITROSTAT) 0.4 MG SL tablet    Sig: Place 1 tablet (0.4 mg total) under the tongue every 5 (five) minutes as needed for chest pain (up to 3 doses).    Dispense:  90 tablet    Refill:  3    Disposition:  Follow up  one month  Signed, Kambry Takacs , MD, 05/24/2022 11:31 AM    [facility] at [facility] [address], [place], 08-14-1986 [address]

## 2022-05-24 NOTE — Patient Instructions (Addendum)
Medication Instructions:  Your physician has recommended you make the following change in your medication:  Start: -Imdur 30 mg tablets daily -Lasix 20 mg tablets daily -SL Nitro 0.4 mg as needed for chest pain.   Labwork: Today: -CMET -Lipid  Testing/Procedures: Your physician has requested that you have an echocardiogram. Echocardiography is a painless test that uses sound waves to create images of your heart. It provides your doctor with information about the size and shape of your heart and how well your heart's chambers and valves are working. This procedure takes approximately one hour. There are no restrictions for this procedure. Please do NOT wear cologne, perfume, aftershave, or lotions (deodorant is allowed). Please arrive 15 minutes prior to your appointment time.  Coronary CTA  Follow-Up: Follow up with Dr. Jenene Slicker in 1 month  Any Other Special Instructions Will Be Listed Below (If Applicable).     If you need a refill on your cardiac medications before your next appointment, please call your pharmacy.     Your cardiac CT will be scheduled at one of the below locations:   [facility] [address] [place], [place] [address] [phone]  OR  [facility] [address] [place], [place] [address] [phone]  OR   [facility] [address] [place], [place] [address] [phone]  If scheduled at [facility], please arrive at the [facility] and Children's Entrance (Entrance C2) of [facility] 30 minutes prior to test start time. You can use the FREE valet parking offered at entrance C (encouraged to control the heart rate for the test)  Proceed to the [facility] Radiology Department (first floor) to check-in and test prep.  All radiology patients and guests should use entrance C2 at [facility], accessed from [facility], even  though the hospital's physical address listed is [address]    If scheduled at [facility] or [facility], please arrive 15 mins early for check-in and test prep.   Please follow these instructions carefully (unless otherwise directed):  TAKE METOPROLOL SUCCINATE 100 MG (4 TABLETS) 2 HOURS PRIOR TO CT SCAN.  Hold all erectile dysfunction medications at least 3 days (72 hrs) prior to test. (Ie viagra, cialis, sildenafil, tadalafil, etc) We will administer nitroglycerin during this exam.   On the Night Before the Test: Be sure to Drink plenty of water. Do not consume any caffeinated/decaffeinated beverages or chocolate 12 hours prior to your test. Do not take any antihistamines 12 hours prior to your test.  On the Day of the Test: Drink plenty of water until 1 hour prior to the test. Do not eat any food 1 hour prior to test. You may take your regular medications prior to the test.  Take metoprolol (Lopressor) two hours prior to test. HOLD Furosemide/Hydrochlorothiazide morning of the test.        After the Test: Drink plenty of water. After receiving IV contrast, you may experience a mild flushed feeling. This is normal. On occasion, you may experience a mild rash up to 24 hours after the test. This is not dangerous. If this occurs, you can take Benadryl 25 mg and increase your fluid intake. If you experience trouble breathing, this can be serious. If it is severe call 911 IMMEDIATELY. If it is mild, please call our office. If you take any of these medications: Glipizide/Metformin, Avandament, Glucavance, please do not take  48 hours after completing test unless otherwise instructed.  We will call to schedule your test 2-4 weeks out understanding that some insurance companies will need an authorization prior to the service being performed.   For non-scheduling related questions, please contact the cardiac imaging nurse  navigator should you have any questions/concerns: Marchia Bond, Cardiac Imaging Nurse Navigator Gordy Clement, Cardiac Imaging Nurse Navigator [facility] Direct Office Dial: [phone]   For scheduling needs, including cancellations and rescheduling, please call [place], [phone].

## 2022-05-25 ENCOUNTER — Telehealth (HOSPITAL_COMMUNITY): Payer: Self-pay | Admitting: *Deleted

## 2022-05-25 NOTE — Telephone Encounter (Signed)
Reaching out to patient to offer assistance regarding upcoming cardiac imaging study; pt verbalizes understanding of appt date/time, parking situation and where to check in, and verified current allergies; name and call back number provided for further questions should they arise  Gordy Clement RN Navigator Cardiac Imaging [facility] [phone] office [phone] cell  Patient to take 100mg  metoprolol tartrate two hours prior to his cardiac CT scan. He is aware to arrive at 7:30am.

## 2022-05-26 ENCOUNTER — Ambulatory Visit (HOSPITAL_COMMUNITY)
Admission: RE | Admit: 2022-05-26 | Discharge: 2022-05-26 | Disposition: A | Discharge status: Home / Self Care | Payer: Medicare Other | Source: Ambulatory Visit | Attending: Internal Medicine | Admitting: Internal Medicine

## 2022-05-26 ENCOUNTER — Ambulatory Visit (HOSPITAL_BASED_OUTPATIENT_CLINIC_OR_DEPARTMENT_OTHER)
Admission: RE | Admit: 2022-05-26 | Discharge: 2022-05-26 | Disposition: A | Discharge status: Home / Self Care | Payer: Medicare Other | Source: Ambulatory Visit | Attending: Internal Medicine | Admitting: Internal Medicine

## 2022-05-26 ENCOUNTER — Other Ambulatory Visit: Payer: Self-pay | Admitting: Internal Medicine

## 2022-05-26 DIAGNOSIS — R931 Abnormal findings on diagnostic imaging of heart and coronary circulation: Secondary | ICD-10-CM | POA: Diagnosis present

## 2022-05-26 DIAGNOSIS — I2510 Atherosclerotic heart disease of native coronary artery without angina pectoris: Secondary | ICD-10-CM

## 2022-05-26 DIAGNOSIS — I209 Angina pectoris, unspecified: Secondary | ICD-10-CM | POA: Diagnosis present

## 2022-05-26 MED ORDER — NITROGLYCERIN 0.4 MG SL SUBL
0.8000 mg | SUBLINGUAL_TABLET | Freq: Once | SUBLINGUAL | Status: AC
  Administered 2022-05-26: 0.8 mg via SUBLINGUAL

## 2022-05-26 MED ORDER — NITROGLYCERIN 0.4 MG SL SUBL
SUBLINGUAL_TABLET | SUBLINGUAL | Status: AC
  Filled 2022-05-26: qty 2

## 2022-05-26 MED ORDER — METOPROLOL TARTRATE 5 MG/5ML IV SOLN
INTRAVENOUS | Status: AC
  Filled 2022-05-26: qty 10

## 2022-05-26 MED ORDER — METOPROLOL TARTRATE 5 MG/5ML IV SOLN: 10.0000 mg | INTRAVENOUS | Status: DC | PRN

## 2022-05-26 MED ORDER — IOHEXOL 350 MG/ML SOLN
95.0000 mL | Freq: Once | INTRAVENOUS | Status: AC | PRN
  Administered 2022-05-26: 95 mL via INTRAVENOUS

## 2022-05-27 ENCOUNTER — Telehealth: Payer: Self-pay

## 2022-05-27 NOTE — Telephone Encounter (Signed)
-----   Message from Chalmers Guest, MD sent at 05/25/2022  7:11 PM EDT ----- Normal lipid panel.

## 2022-05-27 NOTE — Telephone Encounter (Signed)
Patient notified via MyChart. PCP copied.  

## 2022-05-30 ENCOUNTER — Telehealth: Payer: Self-pay

## 2022-05-30 NOTE — Telephone Encounter (Signed)
Patient notified and verbalized understanding. Patient had no questions or concerns at this time. PCP copied 

## 2022-05-30 NOTE — Telephone Encounter (Signed)
-----   Message from Chalmers Guest, MD sent at 05/27/2022  9:01 PM EDT ----- There is moderate disease in all of his heart vessels but it is not significant enough for [place] to perform any stent. Will manage medically and see him at his scheduled follow-up visit.

## 2022-06-01 ENCOUNTER — Encounter: Payer: Self-pay | Admitting: Internal Medicine

## 2022-06-01 ENCOUNTER — Telehealth: Payer: Self-pay | Admitting: Internal Medicine

## 2022-06-01 NOTE — Telephone Encounter (Signed)
Pt c/o medication issue:  1. Name of Medication: atorvastatin (LIPITOR) 80 MG tablet Mavyret  2. How are you currently taking this medication (dosage and times per day)? As prescribed   3. Are you having a reaction (difficulty breathing--STAT)? No  4. What is your medication issue? [facility] is calling to get an alternative to atorvastatin since it would not be good to take with an 8 week treatment he is about to start with [place]. Requesting call back to discuss.

## 2022-06-01 NOTE — Telephone Encounter (Signed)
Error

## 2022-06-01 NOTE — Telephone Encounter (Signed)
Patient notified and verbalized understanding to hold Atorvastatin during 8 week treatment with [facility].   Spoke to Foreman with [employer] and verbalized that pt is to hold Atorvastatin during 8 week treatment.

## 2022-06-09 ENCOUNTER — Ambulatory Visit (HOSPITAL_COMMUNITY)
Admission: RE | Admit: 2022-06-09 | Discharge: 2022-06-09 | Disposition: A | Discharge status: Home / Self Care | Payer: Medicare Other | Source: Ambulatory Visit | Attending: Internal Medicine | Admitting: Internal Medicine

## 2022-06-09 DIAGNOSIS — R079 Chest pain, unspecified: Secondary | ICD-10-CM

## 2022-06-09 LAB — ECHOCARDIOGRAM COMPLETE
Area-P 1/2: 3.99 cm2
S' Lateral: 2.5 cm

## 2022-06-09 NOTE — Progress Notes (Signed)
*  PRELIMINARY RESULTS* Echocardiogram 2D Echocardiogram has been performed.  Brendan Charles 06/09/2022, 12:49 PM

## 2022-07-04 ENCOUNTER — Encounter: Payer: Self-pay | Admitting: Internal Medicine

## 2022-07-04 ENCOUNTER — Ambulatory Visit: Payer: Medicare Other | Admitting: Internal Medicine

## 2022-07-04 ENCOUNTER — Ambulatory Visit: Payer: Medicare Other | Attending: Internal Medicine | Admitting: Internal Medicine

## 2022-07-04 VITALS — BP 110/70 | HR 87 | Ht 71.0 in | Wt 169.4 lb

## 2022-07-04 DIAGNOSIS — I2510 Atherosclerotic heart disease of native coronary artery without angina pectoris: Secondary | ICD-10-CM | POA: Insufficient documentation

## 2022-07-04 DIAGNOSIS — I5189 Other ill-defined heart diseases: Secondary | ICD-10-CM | POA: Diagnosis present

## 2022-07-04 NOTE — Progress Notes (Signed)
Cardiology Office Note  Date: 07/04/2022   ID: MEER REINDL, DOB [birth year], MRN [id number]  PCP:  Elfredia Nevins, MD  Cardiologist:  Marjo Bicker, MD Electrophysiologist:  Brendan Charles   Reason for Office Visit: Follow-up of CAD  History of Present Illness: Brendan FEBLES is a 66 y.o. male known to have moderate CAD, multiple strokes and TIAs secondary to small vessel disease, HTN, HLD presented to cardiology clinic for follow-up visit.  Patient underwent echo on 06/09/2022 which showed LVEF 60 to 65%, mild LVH and grade 1 diastolic dysfunction. RV systolic function is normal. No valvular abnormalities. He also underwent CT cardiac for chest pain evaluation which showed moderate diffuse CAD of LAD and LCx with no flow-limiting lesions.  He presented today for follow-up visit.  After taking Imdur 30 mg once daily, his chest pain is completely resolved with occurrence of 1 episode of chest pain lasting for 20 to 25 minutes in the last 2 months. He did not take any SL NTG 0.4 mg as needed. Denied any DOE, dizziness, lightheadedness, palpitations or syncope.  Patient is currently off statin due to hepatitis C treatment. He will resume it once the treatment is completed. Otherwise he is compliant with his medications and no side effects. He walks with a walker and perform his daily activities. Denied smoking cigarettes, illicit drug abuse or alcohol use.  Past Medical History:  Diagnosis Date   Anxiety    Chronic back pain    CVA (cerebral vascular accident) (HCC) 03/20/2017   Hepatitis C    HEP C, never treated, 2007   Hypertension     Past Surgical History:  Procedure Laterality Date   CERVICAL DISC SURGERY  2001   anterior   COLONOSCOPY WITH PROPOFOL N/A 03/14/2017   Procedure: COLONOSCOPY WITH PROPOFOL;  Surgeon: West Bali, MD;  [facility];  Service: Endoscopy;  Laterality: N/A;  8:15am   CYSTOSCOPY W/ URETERAL STENT PLACEMENT Left 03/23/2017   Procedure:  CYSTOSCOPY WITH LEFT RETROGRADE PYELOGRAM/URETERAL LEFT STENT PLACEMENT;  Surgeon: Malen Gauze, MD;  [facility];  Service: Urology;  Laterality: Left;   CYSTOSCOPY/RETROGRADE/URETEROSCOPY Left 07/17/2017   Procedure: CYSTOSCOPY/RETROGRADE/URETEROSCOPY, stent removal, stone extraction, stent replacement;  Surgeon: Malen Gauze, MD;  [facility];  Service: Urology;  Laterality: Left;   HOLMIUM LASER APPLICATION Left 07/17/2017   Procedure: HOLMIUM LASER APPLICATION;  Surgeon: Malen Gauze, MD;  [facility];  Service: Urology;  Laterality: Left;   LUMBAR SPINE SURGERY  2015   POLYPECTOMY  03/14/2017   Procedure: POLYPECTOMY;  Surgeon: West Bali, MD;  [facility];  Service: Endoscopy;;  rectum   TEE WITHOUT CARDIOVERSION N/A 09/19/2017   Procedure: TRANSESOPHAGEAL ECHOCARDIOGRAM (TEE);  Surgeon: Antoine Poche, MD;  [facility];  Service: Endoscopy;  Laterality: N/A;    Current Outpatient Medications  Medication Sig Dispense Refill   amLODipine (NORVASC) 5 MG tablet Take 5 mg by mouth daily.  11   atorvastatin (LIPITOR) 80 MG tablet TAKE ONE TABLET BY MOUTH EVERY EVENING 30 tablet 4   clopidogrel (PLAVIX) 75 MG tablet Take 1 tablet (75 mg total) by mouth daily with breakfast. 30 tablet 5   escitalopram (LEXAPRO) 10 MG tablet Take 10 mg by mouth daily.  11   furosemide (LASIX) 20 MG tablet Take 1 tablet (20 mg total) by mouth daily. 90 tablet 3   isosorbide mononitrate (IMDUR) 30 MG 24 hr tablet Take 1 tablet (30  mg total) by mouth daily. 90 tablet 3   losartan (COZAAR) 100 MG tablet Take 100 mg by mouth daily.  11   metoprolol tartrate (LOPRESSOR) 25 MG tablet Take 1 tablet (25 mg total) by mouth 2 (two) times daily. 60 tablet 0   nitroGLYCERIN (NITROSTAT) 0.4 MG SL tablet Place 1 tablet (0.4 mg total) under the tongue every 5 (five) minutes as needed for chest pain (up to 3 doses). 90 tablet 3   omeprazole (PRILOSEC) 40 MG  capsule Take 40 mg by mouth daily.     oxyCODONE-acetaminophen (PERCOCET) 7.5-325 MG tablet Take 1 tablet by mouth every 4 (four) hours as needed for severe pain. (Patient not taking: Reported on 05/24/2022) 30 tablet 0   No current facility-administered medications for this visit.   Allergies:  Codeine   Social History: The patient  reports that he quit smoking about 5 years ago. His smoking use included cigarettes. He has a 22.50 pack-year smoking history. He has never used smokeless tobacco. He reports that he does not drink alcohol and does not use drugs.   Family History: The patient's family history includes Cancer in his mother; Dementia in his sister.   ROS:  Please see the history of present illness. Otherwise, complete review of systems is positive for Brendan Charles.  All other systems are reviewed and negative.   Physical Exam: VS:  BP 110/70   Pulse 87   Ht 5\' 11"  (1.803 m)   Wt 169 lb 6.4 oz (76.8 kg)   SpO2 97%   BMI 23.63 kg/m , BMI Body mass index is 23.63 kg/m.  Wt Readings from Last 3 Encounters:  07/04/22 169 lb 6.4 oz (76.8 kg)  05/24/22 174 lb (78.9 kg)  07/12/21 170 lb (77.1 kg)    General: Patient appears comfortable at rest. HEENT: Conjunctiva and lids normal, oropharynx clear with moist mucosa. Neck: Supple, no thyromegaly. Lungs: Bibasilar rales + Cardiac: Regular rate and rhythm, no S3 or significant systolic murmur, no pericardial rub. Abdomen: Soft, nontender, no hepatomegaly, bowel sounds present, no guarding or rebound. Extremities: No pitting edema, distal pulses 2+. Skin: Warm and dry. Musculoskeletal: No kyphosis. Neuropsychiatric: Alert and oriented x3, affect grossly appropriate.  ECG:  An ECG dated recent was personally reviewed today and demonstrated:  NSR and no ST-T changes  Recent Labwork: 05/24/2022: ALT 39; AST 25; BUN 24; Creatinine, Ser 0.98; Hemoglobin 15.3; Platelets 315; Potassium 4.3; Sodium 142     Component Value Date/Time   CHOL  99 05/24/2022 1154   CHOL 131 08/28/2017 1013   TRIG 55 05/24/2022 1154   HDL 39 (L) 05/24/2022 1154   HDL 51 08/28/2017 1013   CHOLHDL 2.5 05/24/2022 1154   VLDL 11 05/24/2022 1154   LDLCALC 49 05/24/2022 1154   LDLCALC 68 08/28/2017 1013    Other Studies Reviewed Today: Echo in November 2023 LVEF [id number] Grade 1 diastolic dysfunction  CT cardiac 05/2022 Moderate CAD of LAD and LCx with no flow-limiting lesions  Echo in 2019 Study Conclusions  - Left ventricle: Systolic function was normal. The estimated    ejection fraction was in the range of 55% to 60%.  - Aortic valve: Mildly calcified annulus. Mildly thickened    leaflets.  - Atrial septum: No defect or patent foramen ovale was identified.    Echo contrast study (bubble study) showed no right-to-left atrial    level shunt, following an increase in RA pressure induced by    provocative maneuvers.  - No evidence  of intracardiac thrombus. Questionable semi-mobile    plaque in the proximal ascending aorta, consider CTA to better    define in the setting of CVA.   USG Carotids in 2018 IMPRESSION: Small amount of plaque at the level of the left carotid bulb. No evidence of plaque in either internal carotid artery or evidence of ICA stenosis bilaterally.  Assessment and Plan: Patient is a [age] year old male known to have HTN, HLD, multiple strokes and TIAs presented to the cardiology clinic for evaluation of chest pressure.  Accompanied by son.  # Coronary artery disease with stable angina PLAN -CT cardiac showed diffuse moderate CAD of LAD and LCx with no flow-limiting lesions.  Continue Imdur 30 mg once daily and SL NTG 0.4 mg as needed for breakthrough chest pains. -Patient is currently off of atorvastatin 80 mg as he is on hepatitis C treatment right now.  I instructed him to resume atorvastatin after his lipid treatment is completed. He voiced understanding. -Continue Imdur 30 mg once daily for antianginal  therapy -ER precautions for chest pain provided.  #Multiple strokes and TIAs history secondary to small vessel disease with residual left-sided weakness PLAN -Continue Plavix 75 mg once daily -Patient is currently off of atorvastatin 80 mg as he is on hepatitis C treatment right now.  I instructed him to resume atorvastatin after his lipid treatment is completed. He voiced understanding.  # HFpEF (grade 1 diastolic dysfunction) -Continue Lasix 20 mg once daily  # Hyperlipidemia, currently at goal -Patient is currently off of atorvastatin 80 mg as he is on hepatitis C treatment right now.  I instructed him to resume atorvastatin after his lipid treatment is completed. He voiced understanding.  LDL 49 one month ago.  Goal LDL less than 70.  #HTN, controlled PLAN -Continue amlodipine 5 mg once daily -Continue losartan 100 mg once daily -Continue metoprolol tartrate 25 mg twice daily  I have spent a total of 35 minutes with patient reviewing chart , telemetry, EKGs, labs and examining patient as well as establishing an assessment and plan that was discussed with the patient.  > 50% of time was spent in direct patient care.     Medication Adjustments/Labs and Tests Ordered: Current medicines are reviewed at length with the patient today.  Concerns regarding medicines are outlined above.   Tests Ordered: No orders of the defined types were placed in this encounter.   Medication Changes: No orders of the defined types were placed in this encounter.   Disposition:  Follow up  6 months  Signed, Aarion Kittrell Fidel Levy, MD, 07/04/2022 10:57 AM    [place] [facility] [address] [place], [place] [address]

## 2022-07-04 NOTE — Patient Instructions (Signed)
Medication Instructions:  Your physician recommends that you continue on your current medications as directed. Please refer to the Current Medication list given to you today.  Restart Statin after completing Hepatitis C treatment   *If you need a refill on your cardiac medications before your next appointment, please call your pharmacy*   Lab Work: NONE   If you have labs (blood work) drawn today and your tests are completely normal, you will receive your results only by: MyChart Message (if you have MyChart) OR A paper copy in the mail If you have any lab test that is abnormal or we need to change your treatment, we will call you to review the results.   Testing/Procedures: NONE    Follow-Up: At [facility], you and your health needs are our priority.  As part of our continuing mission to provide you with exceptional heart care, we have created designated Provider Care Teams.  These Care Teams include your primary Cardiologist (physician) and Advanced Practice Providers (APPs -  Physician Assistants and Nurse Practitioners) who all work together to provide you with the care you need, when you need it.  We recommend signing up for the patient portal called "MyChart".  Sign up information is provided on this After Visit Summary.  MyChart is used to connect with patients for Virtual Visits (Telemedicine).  Patients are able to view lab/test results, encounter notes, upcoming appointments, etc.  Non-urgent messages can be sent to your provider as well.   To learn more about what you can do with MyChart, go to [email or web address].    Your next appointment:   6 month(s)  The format for your next appointment:   In Person  Provider:   You may see Vishnu P Mallipeddi, MD or one of the following Advanced Practice Providers on your designated Care Team:   Randall An, PA-C  Jacolyn Reedy, PA-C     Other Instructions Thank you for choosing [facility]!    Important Information About Sugar

## 2023-01-10 ENCOUNTER — Ambulatory Visit: Payer: Medicare Other | Admitting: Internal Medicine

## 2023-01-17 ENCOUNTER — Other Ambulatory Visit: Payer: Self-pay

## 2023-01-17 ENCOUNTER — Ambulatory Visit: Payer: Medicare Other | Attending: Internal Medicine | Admitting: Internal Medicine

## 2023-01-17 ENCOUNTER — Encounter: Payer: Self-pay | Admitting: Internal Medicine

## 2023-01-17 VITALS — BP 112/76 | HR 77 | Ht 71.0 in | Wt 158.6 lb

## 2023-01-17 DIAGNOSIS — I2510 Atherosclerotic heart disease of native coronary artery without angina pectoris: Secondary | ICD-10-CM | POA: Diagnosis present

## 2023-01-17 NOTE — Progress Notes (Signed)
Cardiology Office Note  Date: 01/17/2023   ID: Brendan Charles, DOB 1956-05-02, MRN [id number]  PCP:  Brendan Nevins, MD  Cardiologist:  Brendan Bicker, MD Electrophysiologist:  None   Reason for Office Visit: Follow-up of CAD   History of Present Illness: Brendan Charles is a 67 y.o. male known to have moderate CAD, multiple strokes and TIAs secondary to small vessel disease, HTN, HLD presented to cardiology clinic for follow-up visit.  Patient underwent echo on 06/09/2022 which showed LVEF 60 to 65%, mild LVH and grade 1 diastolic dysfunction. RV systolic function is normal. No valvular abnormalities. He also underwent CT cardiac for chest pain evaluation which showed moderate diffuse CAD of LAD and LCx with no flow-limiting lesions.  He presented today for follow-up visit.  He had 2 episodes of chest pain in the last 6 months and this occurred at rest when he was laying on his back.  He also has GERD for which he takes omeprazole every other day.  But he denied any chest pain with exertion.  No DOE, orthopnea, PND, syncope, palpitations or leg swelling.  He has dizziness once in a while.  He walks with a walker and perform his daily activities. Denied smoking cigarettes, illicit drug abuse or alcohol use.  Past Medical History:  Diagnosis Date   Anxiety    Chronic back pain    CVA (cerebral vascular accident) (HCC) 03/20/2017   Hepatitis C    HEP C, never treated, 2007   Hypertension     Past Surgical History:  Procedure Laterality Date   CERVICAL DISC SURGERY  2001   anterior   COLONOSCOPY WITH PROPOFOL N/A 03/14/2017   Procedure: COLONOSCOPY WITH PROPOFOL;  Surgeon: West Bali, MD;  [facility];  Service: Endoscopy;  Laterality: N/A;  8:15am   CYSTOSCOPY W/ URETERAL STENT PLACEMENT Left 03/23/2017   Procedure: CYSTOSCOPY WITH LEFT RETROGRADE PYELOGRAM/URETERAL LEFT STENT PLACEMENT;  Surgeon: Malen Gauze, MD;  [facility];  Service:  Urology;  Laterality: Left;   CYSTOSCOPY/RETROGRADE/URETEROSCOPY Left 07/17/2017   Procedure: CYSTOSCOPY/RETROGRADE/URETEROSCOPY, stent removal, stone extraction, stent replacement;  Surgeon: Malen Gauze, MD;  [facility];  Service: Urology;  Laterality: Left;   HOLMIUM LASER APPLICATION Left 07/17/2017   Procedure: HOLMIUM LASER APPLICATION;  Surgeon: Malen Gauze, MD;  [facility];  Service: Urology;  Laterality: Left;   LUMBAR SPINE SURGERY  2015   POLYPECTOMY  03/14/2017   Procedure: POLYPECTOMY;  Surgeon: West Bali, MD;  [facility];  Service: Endoscopy;;  rectum   TEE WITHOUT CARDIOVERSION N/A 09/19/2017   Procedure: TRANSESOPHAGEAL ECHOCARDIOGRAM (TEE);  Surgeon: Antoine Poche, MD;  [facility];  Service: Endoscopy;  Laterality: N/A;    Current Outpatient Medications  Medication Sig Dispense Refill   amLODipine (NORVASC) 5 MG tablet Take 5 mg by mouth daily.  11   atorvastatin (LIPITOR) 80 MG tablet TAKE ONE TABLET BY MOUTH EVERY EVENING 30 tablet 4   clopidogrel (PLAVIX) 75 MG tablet Take 1 tablet (75 mg total) by mouth daily with breakfast. 30 tablet 5   escitalopram (LEXAPRO) 10 MG tablet Take 10 mg by mouth daily.  11   furosemide (LASIX) 20 MG tablet Take 1 tablet (20 mg total) by mouth daily. 90 tablet 3   isosorbide mononitrate (IMDUR) 30 MG 24 hr tablet Take 1 tablet (30 mg total) by mouth daily. 90 tablet 3   losartan (COZAAR) 100 MG tablet Take  100 mg by mouth daily.  11   metoprolol tartrate (LOPRESSOR) 25 MG tablet Take 1 tablet (25 mg total) by mouth 2 (two) times daily. 60 tablet 0   nitroGLYCERIN (NITROSTAT) 0.4 MG SL tablet Place 1 tablet (0.4 mg total) under the tongue every 5 (five) minutes as needed for chest pain (up to 3 doses). 90 tablet 3   omeprazole (PRILOSEC) 40 MG capsule Take 40 mg by mouth daily.     oxyCODONE-acetaminophen (PERCOCET) 7.5-325 MG tablet Take 1 tablet by mouth every 4 (four) hours as  needed for severe pain. (Patient not taking: Reported on 05/24/2022) 30 tablet 0   No current facility-administered medications for this visit.   Allergies:  Codeine   Social History: The patient  reports that he quit smoking about 5 years ago. His smoking use included cigarettes. He has a 22.50 pack-year smoking history. He has never used smokeless tobacco. He reports that he does not drink alcohol and does not use drugs.   Family History: The patient's family history includes Cancer in his mother; Dementia in his sister.   ROS:  Please see the history of present illness. Otherwise, complete review of systems is positive for none.  All other systems are reviewed and negative.   Physical Exam: VS:  There were no vitals taken for this visit., BMI There is no height or weight on file to calculate BMI.  Wt Readings from Last 3 Encounters:  07/04/22 169 lb 6.4 oz (76.8 kg)  05/24/22 174 lb (78.9 kg)  07/12/21 170 lb (77.1 kg)    General: Patient appears comfortable at rest. HEENT: Conjunctiva and lids normal, oropharynx clear with moist mucosa. Neck: Supple, no thyromegaly. Lungs: Bibasilar rales + Cardiac: Regular rate and rhythm, no S3 or significant systolic murmur, no pericardial rub. Abdomen: Soft, nontender, no hepatomegaly, bowel sounds present, no guarding or rebound. Extremities: No pitting edema, distal pulses 2+. Skin: Warm and dry. Musculoskeletal: No kyphosis. Neuropsychiatric: Alert and oriented x3, affect grossly appropriate.  Recent Labwork: 05/24/2022: ALT 39; AST 25; BUN 24; Creatinine, Ser 0.98; Hemoglobin 15.3; Platelets 315; Potassium 4.3; Sodium 142     Component Value Date/Time   CHOL 99 05/24/2022 1154   CHOL 131 08/28/2017 1013   TRIG 55 05/24/2022 1154   HDL 39 (L) 05/24/2022 1154   HDL 51 08/28/2017 1013   CHOLHDL 2.5 05/24/2022 1154   VLDL 11 05/24/2022 1154   LDLCALC 49 05/24/2022 1154   LDLCALC 68 08/28/2017 1013    Other Studies Reviewed  Today: Echo in December 20, 2023LVEF 60-65% Grade 1 diastolic dysfunction  CT cardiac 05/2022 Moderate CAD of LAD and LCx with no flow-limiting lesions  Echo in 2019 Study Conclusions  - Left ventricle: Systolic function was normal. The estimated    ejection fraction was in the range of 55% to 60%.  - Aortic valve: Mildly calcified annulus. Mildly thickened    leaflets.  - Atrial septum: No defect or patent foramen ovale was identified.    Echo contrast study (bubble study) showed no right-to-left atrial    level shunt, following an increase in RA pressure induced by    provocative maneuvers.  - No evidence of intracardiac thrombus. Questionable semi-mobile    plaque in the proximal ascending aorta, consider CTA to better    define in the setting of CVA.   USG Carotids in 2018 IMPRESSION: Small amount of plaque at the level of the left carotid bulb. No evidence of plaque in either internal carotid  artery or evidence of ICA stenosis bilaterally.  Assessment and Plan: Patient is a [age] year old male known to have moderate LAD disease, HTN, HLD, multiple strokes and TIAs presented to the cardiology clinic for evaluation of chest pressure.  Accompanied by son.  # Coronary artery disease with stable angina -CT cardiac in 11/23 showed diffuse moderate disease of LAD and LCx with no flow-limiting lesions. 2 episodes of chest pain in the last 6 months but these occur at rest. Could be from GERD. No chest pain with exertion. -Continue Plavix 75 mg once daily (on Plavix due to multiple TIAs/CVAs) -Continue atorvastatin 80 mg nightly -Continue antianginal therapy with Imdur 30 mg once daily, metoprolol tartrate 25 mg twice daily and amlodipine 5 mg once daily. -ER precautions for chest pain  #Multiple strokes and TIAs history secondary to small vessel disease with residual left-sided weakness -Continue Plavix and 5 mg once daily -Continue atorvastatin 80 mg nightly  # HFpEF (grade 1  diastolic dysfunction) -Continue p.o. Lasix 20 mg once daily  # Hyperlipidemia, currently at goal -Continue atorvastatin 80 mg at bedtime, goal LDL less than 55 (due to CVA and CAD). Currently at goal.  LDL 49 in 10/23.  #HTN, controlled -Continue amlodipine 5 mg once daily, losartan 100 mg once daily, metoprolol tartarate 25 mg twice daily and Imdur 30 mg once daily.  I have spent a total of 35 minutes with patient reviewing chart , telemetry, EKGs, labs and examining patient as well as establishing an assessment and plan that was discussed with the patient.  > 50% of time was spent in direct patient care.     Medication Adjustments/Labs and Tests Ordered: Current medicines are reviewed at length with the patient today.  Concerns regarding medicines are outlined above.   Tests Ordered: No orders of the defined types were placed in this encounter.   Medication Changes: No orders of the defined types were placed in this encounter.   Disposition:  Follow up  6 months  Signed, Arvind Mexicano Verne Spurr, MD, 01/17/2023 9:23 AM    [facility] at [facility] [address] [place], [place] [address]

## 2023-01-17 NOTE — Patient Instructions (Signed)
Medication Instructions:  Your physician recommends that you continue on your current medications as directed. Please refer to the Current Medication list given to you today.  *If you need a refill on your cardiac medications before your next appointment, please call your pharmacy*   Lab Work: NONE   If you have labs (blood work) drawn today and your tests are completely normal, you will receive your results only by: MyChart Message (if you have MyChart) OR A paper copy in the mail If you have any lab test that is abnormal or we need to change your treatment, we will call you to review the results.   Testing/Procedures: NONE    Follow-Up: At [facility], you and your health needs are our priority.  As part of our continuing mission to provide you with exceptional heart care, we have created designated Provider Care Teams.  These Care Teams include your primary Cardiologist (physician) and Advanced Practice Providers (APPs -  Physician Assistants and Nurse Practitioners) who all work together to provide you with the care you need, when you need it.  We recommend signing up for the patient portal called "MyChart".  Sign up information is provided on this After Visit Summary.  MyChart is used to connect with patients for Virtual Visits (Telemedicine).  Patients are able to view lab/test results, encounter notes, upcoming appointments, etc.  Non-urgent messages can be sent to your provider as well.   To learn more about what you can do with MyChart, go to [email or web address].    Your next appointment:   6 month(s)  Provider:   Vishnu Mallipeddi, MD    Other Instructions Thank you for choosing [facility]!    

## 2023-01-23 NOTE — Progress Notes (Signed)
Order(s) created erroneously. Erroneous order ID: [id number]  Order moved by: Leonia Corona  Order move date/time: 01/23/2023 4:27 PM  Source Patient: [id number]  Source Contact: 01/17/2023  Destination Patient: [id number]  Destination Contact: 01/18/2023

## 2023-05-26 ENCOUNTER — Telehealth: Payer: Self-pay | Admitting: Internal Medicine

## 2023-05-26 MED ORDER — NITROGLYCERIN 0.4 MG SL SUBL: 0.4000 mg | SUBLINGUAL_TABLET | SUBLINGUAL | 3 refills | Status: DC | PRN

## 2023-05-26 MED ORDER — ISOSORBIDE MONONITRATE ER 30 MG PO TB24: 30.0000 mg | ORAL_TABLET | Freq: Every day | ORAL | 3 refills | Status: DC

## 2023-05-26 NOTE — Telephone Encounter (Signed)
Refill request completed.

## 2023-05-26 NOTE — Telephone Encounter (Signed)
*  STAT* If patient is at the pharmacy, call can be transferred to refill team.   1. Which medications need to be refilled? (please list name of each medication and dose if known)   isosorbide mononitrate (IMDUR) 30 MG 24 hr tablet  nitroGLYCERIN (NITROSTAT) 0.4 MG SL tablet    2. Would you like to learn more about the convenience, safety, & potential cost savings by using the [facility]? No   3. Are you open to using the [facility] (Type [facility] ) No   4. Which pharmacy/location (including street and city if local pharmacy) is medication to be sent to? [id number] - [place], [place] - [address]     5. Do they need a 30 day or 90 day supply? 90 day

## 2023-07-26 ENCOUNTER — Ambulatory Visit: Payer: Medicare Other | Attending: Internal Medicine | Admitting: Internal Medicine

## 2023-07-26 VITALS — BP 120/74 | HR 80 | Ht 69.0 in | Wt 124.0 lb

## 2023-07-26 DIAGNOSIS — I5189 Other ill-defined heart diseases: Secondary | ICD-10-CM | POA: Diagnosis present

## 2023-07-26 DIAGNOSIS — I2510 Atherosclerotic heart disease of native coronary artery without angina pectoris: Secondary | ICD-10-CM | POA: Diagnosis present

## 2023-07-26 NOTE — Progress Notes (Signed)
Cardiology Office Note  Date: 07/26/2023   ID: Brendan Charles, DOB [birth year], MRN [id number]  PCP:  Elfredia Nevins, MD  Cardiologist:  Marjo Bicker, MD Electrophysiologist:  None   History of Present Illness: Brendan Charles is a 67 y.o. male known to have moderate CAD, multiple strokes and TIAs secondary to small vessel disease, HTN, HLD presented to cardiology clinic for follow-up visit.  Patient underwent echo on 06/09/2022 which showed LVEF 60 to 65%, mild LVH and grade 1 diastolic dysfunction. RV systolic function is normal. No valvular abnormalities. He also underwent CT cardiac for chest pain evaluation which showed moderate diffuse CAD of LAD and LCx with no flow-limiting lesions.  He presented today for follow-up visit.  Accompanied by son.  He had to take 2 SL NTG pills this month on 2 different occasions for chest pain.  Otherwise he did not have any chest pains in the last 6 months.  No other symptoms of DOE, dizziness, syncope, palpitations or leg swelling.  He did have ER visit/hospital mission at [facility] in [place] in June/July 2024 for recurrent syncope, he was noted to be dehydrated and was taken off Lasix and losartan.  He walks with a walker and perform his daily activities. Denied smoking cigarettes, illicit drug abuse or alcohol use.  Past Medical History:  Diagnosis Date   Anxiety    Chronic back pain    CVA (cerebral vascular accident) (HCC) 03/20/2017   Hepatitis C    HEP C, never treated, 2007   Hypertension     Past Surgical History:  Procedure Laterality Date   CERVICAL DISC SURGERY  2001   anterior   COLONOSCOPY WITH PROPOFOL N/A 03/14/2017   Procedure: COLONOSCOPY WITH PROPOFOL;  Surgeon: West Bali, MD;  Location: [facility];  Service: Endoscopy;  Laterality: N/A;  8:15am   CYSTOSCOPY W/ URETERAL STENT PLACEMENT Left 03/23/2017   Procedure: CYSTOSCOPY WITH LEFT RETROGRADE PYELOGRAM/URETERAL LEFT STENT PLACEMENT;  Surgeon:  Malen Gauze, MD;  Location: [facility];  Service: Urology;  Laterality: Left;   CYSTOSCOPY/RETROGRADE/URETEROSCOPY Left 07/17/2017   Procedure: CYSTOSCOPY/RETROGRADE/URETEROSCOPY, stent removal, stone extraction, stent replacement;  Surgeon: Malen Gauze, MD;  Location: [facility];  Service: Urology;  Laterality: Left;   HOLMIUM LASER APPLICATION Left 07/17/2017   Procedure: HOLMIUM LASER APPLICATION;  Surgeon: Malen Gauze, MD;  Location: [facility];  Service: Urology;  Laterality: Left;   LUMBAR SPINE SURGERY  2015   POLYPECTOMY  03/14/2017   Procedure: POLYPECTOMY;  Surgeon: West Bali, MD;  [facility];  Service: Endoscopy;;  rectum   TEE WITHOUT CARDIOVERSION N/A 09/19/2017   Procedure: TRANSESOPHAGEAL ECHOCARDIOGRAM (TEE);  Surgeon: Antoine Poche, MD;  [facility];  Service: Endoscopy;  Laterality: N/A;    Current Outpatient Medications  Medication Sig Dispense Refill   amLODipine (NORVASC) 5 MG tablet Take 5 mg by mouth daily.  11   atorvastatin (LIPITOR) 80 MG tablet TAKE ONE TABLET BY MOUTH EVERY EVENING 30 tablet 4   clopidogrel (PLAVIX) 75 MG tablet Take 1 tablet (75 mg total) by mouth daily with breakfast. 30 tablet 5   escitalopram (LEXAPRO) 10 MG tablet Take 10 mg by mouth daily.  11   furosemide (LASIX) 20 MG tablet Take 1 tablet (20 mg total) by mouth daily. 90 tablet 3   isosorbide mononitrate (IMDUR) 30 MG 24 hr tablet Take 1 tablet (30 mg total) by mouth daily. 90 tablet 3   losartan (  COZAAR) 100 MG tablet Take 100 mg by mouth daily. (Patient not taking: Reported on 07/26/2023)  11   metoprolol tartrate (LOPRESSOR) 25 MG tablet Take 1 tablet (25 mg total) by mouth 2 (two) times daily. 60 tablet 0   nitroGLYCERIN (NITROSTAT) 0.4 MG SL tablet Place 1 tablet (0.4 mg total) under the tongue every 5 (five) minutes as needed for chest pain (up to 3 doses). 25 tablet 3   omeprazole (PRILOSEC) 40 MG capsule Take 40 mg by mouth daily.     No  current facility-administered medications for this visit.   Allergies:  Codeine   Social History: The patient  reports that he has been smoking cigarettes. He started smoking about 51 years ago. He has a 22.5 pack-year smoking history. He has never used smokeless tobacco. He reports that he does not drink alcohol and does not use drugs.   Family History: The patient's family history includes Cancer in his mother; Dementia in his sister.   ROS:  Please see the history of present illness. Otherwise, complete review of systems is positive for none.  All other systems are reviewed and negative.   Physical Exam: VS:  BP 120/74   Pulse 80   Ht 5\' 9"  (1.753 m)   Wt 124 lb (56.2 kg)   SpO2 97%   BMI 18.31 kg/m , BMI Body mass index is 18.31 kg/m.  Wt Readings from Last 3 Encounters:  07/26/23 124 lb (56.2 kg)  01/17/23 158 lb 9.6 oz (71.9 kg)  07/04/22 169 lb 6.4 oz (76.8 kg)    General: Patient appears comfortable at rest. HEENT: Conjunctiva and lids normal, oropharynx clear with moist mucosa. Neck: Supple, no thyromegaly. Lungs: Bibasilar rales + Cardiac: Regular rate and rhythm, no S3 or significant systolic murmur, no pericardial rub. Abdomen: Soft, nontender, no hepatomegaly, bowel sounds present, no guarding or rebound. Extremities: No pitting edema, distal pulses 2+. Skin: Warm and dry. Musculoskeletal: No kyphosis. Neuropsychiatric: Alert and oriented x3, affect grossly appropriate.  Recent Labwork: No results found for requested labs within last 365 days.     Component Value Date/Time   CHOL 99 05/24/2022 1154   CHOL 131 08/28/2017 1013   TRIG 55 05/24/2022 1154   HDL 39 (L) 05/24/2022 1154   HDL 51 08/28/2017 1013   CHOLHDL 2.5 05/24/2022 1154   VLDL 11 05/24/2022 1154   LDLCALC 49 05/24/2022 1154   LDLCALC 68 08/28/2017 1013    Other Studies Reviewed Today: Echo in November 2023 LVEF 60-65% Grade 1 diastolic dysfunction  CT cardiac 05/2022 Moderate CAD of  LAD and LCx with no flow-limiting lesions  Echo in 2019 Study Conclusions  - Left ventricle: Systolic function was normal. The estimated    ejection fraction was in the range of 55% to 60%.  - Aortic valve: Mildly calcified annulus. Mildly thickened    leaflets.  - Atrial septum: No defect or patent foramen ovale was identified.    Echo contrast study (bubble study) showed no right-to-left atrial    level shunt, following an increase in RA pressure induced by    provocative maneuvers.  - No evidence of intracardiac thrombus. Questionable semi-mobile    plaque in the proximal ascending aorta, consider CTA to better    define in the setting of CVA.   [facility] Carotids in 2018 IMPRESSION: Small amount of plaque at the level of the left carotid bulb. No evidence of plaque in either internal carotid artery or evidence of ICA stenosis bilaterally.  Assessment and Plan: Patient is a [age] year old male known to have moderate LAD disease, HTN, HLD, multiple strokes and TIAs presented to the cardiology clinic for evaluation of chest pressure.  Accompanied by son.  # Coronary artery disease with stable angina -CT cardiac in 11/23 showed diffuse moderate disease of LAD and LCx with no flow-limiting lesions.  2 episodes of chest pain in the last 6 months and had to take 2 SL NTG pills. -Continue cardioprotective medications, Plavix 25 mg once daily (Plavix due to multiple TIAs/CVAs), atorvastatin 80 mg nightly. -Continue antianginal therapy with Imdur 30 mg once daily, metoprolol tartrate 25 mg twice daily and amlodipine 5 mg once daily. -SL NTG 0.4 mg as needed for chest pain -ER precautions for chest pain  #Multiple strokes and TIAs history secondary to small vessel disease with residual left-sided weakness -Continue Plavix 75 mg once daily -Continue atorvastatin 80 mg nightly  # Hyperlipidemia, currently at goal -Continue atorvastatin 80 mg nightly, goal LDL less than 55 (due to CVA and  CAD)  #HTN, controlled -He was admitted at [facility] for dehydration, taken off Lasix and losartan.  Continue current medications, amlodipine 5 mg once daily, Imdur 30 mg once daily and metoprolol tartrate 25 mg twice daily.      Medication Adjustments/Labs and Tests Ordered: Current medicines are reviewed at length with the patient today.  Concerns regarding medicines are outlined above.   Tests Ordered: Orders Placed This Encounter  Procedures   EKG 12-Lead    Medication Changes: No orders of the defined types were placed in this encounter.   Disposition:  Follow up  6 months  Signed, Demri Poulton Verne Spurr, MD, 07/26/2023 9:09 AM    [facility] at [facility] [address] [place], [place] [address]

## 2023-07-26 NOTE — Patient Instructions (Signed)
Medication Instructions:  Your physician recommends that you continue on your current medications as directed. Please refer to the Current Medication list given to you today.  *If you need a refill on your cardiac medications before your next appointment, please call your pharmacy*   Lab Work: None If you have labs (blood work) drawn today and your tests are completely normal, you will receive your results only by: MyChart Message (if you have MyChart) OR A paper copy in the mail If you have any lab test that is abnormal or we need to change your treatment, we will call you to review the results.   Testing/Procedures: None   Follow-Up: At [facility], you and your health needs are our priority.  As part of our continuing mission to provide you with exceptional heart care, we have created designated Provider Care Teams.  These Care Teams include your primary Cardiologist (physician) and Advanced Practice Providers (APPs -  Physician Assistants and Nurse Practitioners) who all work together to provide you with the care you need, when you need it.  We recommend signing up for the patient portal called "MyChart".  Sign up information is provided on this After Visit Summary.  MyChart is used to connect with patients for Virtual Visits (Telemedicine).  Patients are able to view lab/test results, encounter notes, upcoming appointments, etc.  Non-urgent messages can be sent to your provider as well.   To learn more about what you can do with MyChart, go to [email or web address].    Your next appointment:   6 month(s)  Provider:   You may see Vishnu P Mallipeddi, MD or one of the following Advanced Practice Providers on your designated Care Team:   [place], PA-C  Jacolyn Reedy, [place]      Other Instructions

## 2024-02-02 ENCOUNTER — Ambulatory Visit: Payer: Medicare Other | Admitting: Internal Medicine

## 2024-03-08 ENCOUNTER — Ambulatory Visit: Attending: Nurse Practitioner | Admitting: Nurse Practitioner

## 2024-03-08 ENCOUNTER — Encounter: Payer: Self-pay | Admitting: Nurse Practitioner

## 2024-03-08 VITALS — BP 124/80 | HR 66 | Ht 71.0 in | Wt 126.8 lb

## 2024-03-08 DIAGNOSIS — I2510 Atherosclerotic heart disease of native coronary artery without angina pectoris: Secondary | ICD-10-CM | POA: Diagnosis not present

## 2024-03-08 DIAGNOSIS — Z87898 Personal history of other specified conditions: Secondary | ICD-10-CM | POA: Diagnosis present

## 2024-03-08 DIAGNOSIS — E785 Hyperlipidemia, unspecified: Secondary | ICD-10-CM | POA: Diagnosis present

## 2024-03-08 DIAGNOSIS — Z8673 Personal history of transient ischemic attack (TIA), and cerebral infarction without residual deficits: Secondary | ICD-10-CM | POA: Diagnosis present

## 2024-03-08 DIAGNOSIS — I10 Essential (primary) hypertension: Secondary | ICD-10-CM | POA: Insufficient documentation

## 2024-03-08 MED ORDER — AMLODIPINE BESYLATE 5 MG PO TABS: 5.0000 mg | ORAL_TABLET | Freq: Every day | ORAL | 3 refills | Status: DC

## 2024-03-08 MED ORDER — NITROGLYCERIN 0.4 MG SL SUBL: 0.4000 mg | SUBLINGUAL_TABLET | SUBLINGUAL | 3 refills | Status: AC | PRN

## 2024-03-08 MED ORDER — CLOPIDOGREL BISULFATE 75 MG PO TABS: 75.0000 mg | ORAL_TABLET | Freq: Every day | ORAL | 3 refills | Status: DC

## 2024-03-08 MED ORDER — ISOSORBIDE MONONITRATE ER 30 MG PO TB24: 30.0000 mg | ORAL_TABLET | Freq: Every day | ORAL | 3 refills | Status: DC

## 2024-03-08 MED ORDER — METOPROLOL TARTRATE 25 MG PO TABS: 25.0000 mg | ORAL_TABLET | Freq: Two times a day (BID) | ORAL | 3 refills | Status: DC

## 2024-03-08 MED ORDER — ATORVASTATIN CALCIUM 80 MG PO TABS: 80.0000 mg | ORAL_TABLET | Freq: Every evening | ORAL | 3 refills | Status: DC

## 2024-03-08 NOTE — Patient Instructions (Addendum)
 Medication Instructions:  Your physician has recommended you make the following change in your medication:  Please Discontinue Lasix  & Losartan   Labwork: None   Testing/Procedures: None   Follow-Up: Your physician recommends that you schedule a follow-up appointment in: 6 Months   Any Other Special Instructions Will Be Listed Below (If Applicable).  If you need a refill on your cardiac medications before your next appointment, please call your pharmacy.

## 2024-03-08 NOTE — Progress Notes (Signed)
 Cardiology Office Note   Date:  03/08/2024 ID:  Brendan Charles, DOB 1956-07-22, MRN [id number] PCP: Bertell Satterfield, MD  [facility] Providers Cardiologist:  Diannah SHAUNNA Maywood, MD     History of Present Illness Brendan Charles is a 68 y.o. male with a PMH of CAD, hypertension, hyperlipidemia, multiple CVAs and TIAs secondary to small vessel disease, history of recurrent syncope, who presents today for 30-month follow-up appointment.  Previous cardiovascular history includes echocardiogram in November 2023 that showed normal EF, grade 1 DD, mild LVH, normal RV systolic function, no significant valvular abnormalities.  Previous CT cardiac for chest pain evaluation revealed moderate diffuse CAD of LAD and left circumflex, no flow-limiting lesions.  Previous ER visit/hospital admission in June/July 2024 at [facility] in [place] for recurrent syncope.  Patient was noted to be dehydrated, Lasix  and losartan were discontinued.  Last seen by Dr. Mallipeddi on July 26, 2023.  At that visit, patient did report having to take 2 sublingual nitroglycerin  pills that month on 2 different occasions for chest pain, otherwise was doing well.  Today he presents for 52-month follow-up appointment.  He states he is doing well.  Denies any acute cardiac complaints or issues.  Tolerating his medicines well and compliant with this.  No longer taking losartan and Lasix  as these were previously stopped by another provider it appears. Denies any chest pain, shortness of breath, palpitations, syncope, presyncope, dizziness, orthopnea, PND, swelling or significant weight changes, acute bleeding, or claudication.   ROS: Negative.  See HPI.  Studies Reviewed   EKG:  EKG Interpretation Date/Time:  Friday March 08 2024 09:13:28 EDT Ventricular Rate:  68 PR Interval:  172 QRS Duration:  82 QT Interval:  384 QTC Calculation: 408 R Axis:   55  Text Interpretation: Normal sinus rhythm Low  voltage QRS When compared with ECG of 26-Jul-2023 08:57, No significant change was found Confirmed by Miriam Norris [phone]) on 03/08/2024 9:47:24 AM   Echo 06/2022: 1. Left ventricular ejection fraction, by estimation, is 60 to 65%. The  left ventricle has normal function. The left ventricle has no regional  wall motion abnormalities. There is mild concentric left ventricular  hypertrophy. Left ventricular diastolic  parameters are consistent with Grade I diastolic dysfunction (impaired  relaxation).   2. Right ventricular systolic function is normal. The right ventricular  size is normal. Tricuspid regurgitation signal is inadequate for assessing  PA pressure.   3. The mitral valve is grossly normal. Trivial mitral valve  regurgitation.   4. The aortic valve is tricuspid. Aortic valve regurgitation is not  visualized.   5. The inferior vena cava is normal in size with greater than 50%  respiratory variability, suggesting right atrial pressure of 3 mmHg.   Comparison(s): Prior images unable to be directly viewed.  Coronary CTA 05/2022: IMPRESSION: 1. Heavily calcified coronaries with at least moderate mixed multivessel CAD, CADRADS = 3. CT FFR will be performed and reported separately.   2. Coronary calcium  score of 926. This was 86th percentile for age and sex matched control.   3. Normal coronary origin with right dominance.  IMPRESSION: 1.  CT FFR analysis did not show any significant stenosis.   2. The LAD and LCx sharply taper to a small caliber vessel, suggesting possible diffuse coronary disease.   3. Aggressive medical therapy is recommended, especially given high calcium  score and moderate non-calcified plaque burden.   Physical Exam VS:  BP 124/80   Pulse 66   Ht  5' 11 (1.803 m)   Wt 126 lb 12.8 oz (57.5 kg)   SpO2 98%   BMI 17.69 kg/m        Wt Readings from Last 3 Encounters:  03/08/24 126 lb 12.8 oz (57.5 kg)  07/26/23 124 lb (56.2 kg)  01/17/23 158  lb 9.6 oz (71.9 kg)    GEN: Well nourished, well developed in no acute distress NECK: No JVD; No carotid bruits CARDIAC: S1/S2, RRR, no murmurs, rubs, gallops RESPIRATORY:  Clear to auscultation without rales, wheezing or rhonchi  ABDOMEN: Soft, non-tender, non-distended EXTREMITIES:  No edema; No deformity   ASSESSMENT AND PLAN  CAD Stable with no anginal symptoms. No indication for ischemic evaluation.  Continue Plavix , atorvastatin , Lopressor , Imdur , Norvasc , and NTG PRN. Heart healthy diet and regular cardiovascular exercise encouraged.   HTN Blood pressure is well-controlled and at goal. Discussed to monitor BP at home at least 2 hours after medications and sitting for 5-10 minutes.  No medication changes at this time. Heart healthy diet and regular cardiovascular exercise encouraged.   HLD LDL from 1 year ago 56.  Currently at goal.  Continue atorvastatin . Heart healthy diet and regular cardiovascular exercise encouraged.   Multiple CVAs and TIAs secondary to small vessel disease Denies any issues.  Continue current medication regimen.  Continue follow-up with PCP.  Care and ED precautions discussed.  History of recurrent syncope  Continue current medication regiment.  Denies any recurrent syncope.  Continue follow-up with PCP.  Care and ED precautions discussed.   Dispo: Will provide refills per his request.  Follow-up with MD/APP in 6 months or sooner if any changes.  Signed, Almarie Crate, NP

## 2024-06-05 ENCOUNTER — Other Ambulatory Visit: Payer: Self-pay

## 2024-06-26 ENCOUNTER — Telehealth: Payer: Self-pay | Admitting: Internal Medicine

## 2024-06-26 MED ORDER — ISOSORBIDE MONONITRATE ER 30 MG PO TB24: 30.0000 mg | ORAL_TABLET | Freq: Every day | ORAL | 3 refills | Status: AC

## 2024-06-26 NOTE — Telephone Encounter (Signed)
 Refill sent.

## 2024-06-26 NOTE — Telephone Encounter (Signed)
*  STAT* If patient is at the pharmacy, call can be transferred to refill team.   1. Which medications need to be refilled? (please list name of each medication and dose if known)   isosorbide  mononitrate (IMDUR ) 30 MG 24 hr tablet   2. Would you like to learn more about the convenience, safety, & potential cost savings by using the [facility]?   3. Are you open to using the [facility] (Type [facility]. ).  4. Which pharmacy/location (including street and city if local pharmacy) is medication to be sent to?  [facility] [phone] - [place], [place] - [address] AT [facility] [address]   5. Do they need a 30 day or 90 day supply?   90 day  Son Dino) stated he will be out of medication tomorrow.  Patient has appointment scheduled with Dr. Mallipeddi on 2/17.

## 2024-08-14 ENCOUNTER — Ambulatory Visit (INDEPENDENT_AMBULATORY_CARE_PROVIDER_SITE_OTHER): Admitting: Physician Assistant

## 2024-08-14 ENCOUNTER — Encounter: Payer: Self-pay | Admitting: Emergency Medicine

## 2024-08-14 ENCOUNTER — Encounter: Payer: Self-pay | Admitting: Physician Assistant

## 2024-08-14 VITALS — BP 110/78 | HR 86 | Temp 98.1°F | Ht 71.0 in | Wt 174.6 lb

## 2024-08-14 DIAGNOSIS — Z122 Encounter for screening for malignant neoplasm of respiratory organs: Secondary | ICD-10-CM | POA: Diagnosis not present

## 2024-08-14 DIAGNOSIS — I10 Essential (primary) hypertension: Secondary | ICD-10-CM

## 2024-08-14 DIAGNOSIS — I2510 Atherosclerotic heart disease of native coronary artery without angina pectoris: Secondary | ICD-10-CM | POA: Diagnosis not present

## 2024-08-14 DIAGNOSIS — Z0000 Encounter for general adult medical examination without abnormal findings: Secondary | ICD-10-CM

## 2024-08-14 DIAGNOSIS — Z0001 Encounter for general adult medical examination with abnormal findings: Secondary | ICD-10-CM

## 2024-08-14 MED ORDER — METOPROLOL TARTRATE 25 MG PO TABS: 25.0000 mg | ORAL_TABLET | Freq: Two times a day (BID) | ORAL | 3 refills | Status: AC

## 2024-08-14 MED ORDER — CLOPIDOGREL BISULFATE 75 MG PO TABS: 75.0000 mg | ORAL_TABLET | Freq: Every day | ORAL | 3 refills | Status: AC

## 2024-08-14 MED ORDER — ESCITALOPRAM OXALATE 10 MG PO TABS: 10.0000 mg | ORAL_TABLET | Freq: Every day | ORAL | 3 refills | Status: AC

## 2024-08-14 MED ORDER — AMLODIPINE BESYLATE 5 MG PO TABS: 5.0000 mg | ORAL_TABLET | Freq: Every day | ORAL | 3 refills | Status: AC

## 2024-08-14 MED ORDER — ATORVASTATIN CALCIUM 80 MG PO TABS: 80.0000 mg | ORAL_TABLET | Freq: Every evening | ORAL | 3 refills | Status: AC

## 2024-08-14 NOTE — Progress Notes (Signed)
 "  Chief Complaint  Patient presents with   Establish Care    Pt stated no concerns     Subjective:   Brendan Charles is a 69 y.o. male who presents for a Medicare Annual Wellness Visit.  Fall Screening Falls in the past year?: 1 Number of falls in past year: 0 Was there an injury with Fall?: 0 Fall Risk Category Calculator: 1 Patient Fall Risk Level: Low Fall Risk  Advance Directives (For Healthcare) Does Patient Have a Medical Advance Directive?: No Would patient like information on creating a medical advance directive?: Yes (MAU/Ambulatory/Procedural Areas - Information given)   Discussed the use of AI scribe software for clinical note transcription with the patient, who gave verbal consent to proceed.  History of Present Illness Brendan Charles is a 69 year old male who presents to establish care and for a Medicare wellness visit.  He has prior strokes and a long smoking history. He quit smoking in 2018 after his first stroke and has not smoked since. He follows with cardiology every six months and takes Lipitor , Plavix , Lexapro , and metoprolol , which now need to be managed by this clinic due to closure of his prior practice.  He had a bathroom fall in the past year without injury. He uses a walker at home because of residual left-sided weakness from his strokes.  He has no shortness of breath, chest pain, or leg swelling. He feels his memory is sharp without cognitive concerns. His mood and anxiety feel well controlled on Lexapro .  He has not received a flu shot this year and is unsure of his pneumonia, shingles, and tetanus vaccination status, though he recalls a pneumonia shot a few years ago.  He notes recent weight gain, especially over the holidays, though he feels his overall diet is good. He sleeps well and has no vision concerns. He had recent dental work and continues routine dental care.  He reports getting annual blood work with his previous primary care  doctor and expects to continue routine lab monitoring here.   Allergies (verified) Codeine   Current Medications (verified) Outpatient Encounter Medications as of 08/14/2024  Medication Sig   isosorbide  mononitrate (IMDUR ) 30 MG 24 hr tablet Take 1 tablet (30 mg total) by mouth daily.   nitroGLYCERIN  (NITROSTAT ) 0.4 MG SL tablet Place 1 tablet (0.4 mg total) under the tongue every 5 (five) minutes as needed for chest pain (up to 3 doses).   omeprazole (PRILOSEC) 40 MG capsule Take 40 mg by mouth daily.   [DISCONTINUED] amLODipine  (NORVASC ) 5 MG tablet Take 1 tablet (5 mg total) by mouth daily.   [DISCONTINUED] atorvastatin  (LIPITOR ) 80 MG tablet Take 1 tablet (80 mg total) by mouth every evening.   [DISCONTINUED] clopidogrel  (PLAVIX ) 75 MG tablet Take 1 tablet (75 mg total) by mouth daily with breakfast.   [DISCONTINUED] escitalopram  (LEXAPRO ) 10 MG tablet Take 10 mg by mouth daily.   [DISCONTINUED] metoprolol  tartrate (LOPRESSOR ) 25 MG tablet Take 1 tablet (25 mg total) by mouth 2 (two) times daily.   amLODipine  (NORVASC ) 5 MG tablet Take 1 tablet (5 mg total) by mouth daily.   atorvastatin  (LIPITOR ) 80 MG tablet Take 1 tablet (80 mg total) by mouth every evening.   clopidogrel  (PLAVIX ) 75 MG tablet Take 1 tablet (75 mg total) by mouth daily with breakfast.   escitalopram  (LEXAPRO ) 10 MG tablet Take 1 tablet (10 mg total) by mouth daily.   metoprolol  tartrate (LOPRESSOR ) 25 MG tablet Take 1  tablet (25 mg total) by mouth 2 (two) times daily.   No facility-administered encounter medications on file as of 08/14/2024.    History: Past Medical History:  Diagnosis Date   Anxiety    Chronic back pain    CVA (cerebral vascular accident) (HCC) 03/20/2017   Hepatitis C    HEP C, never treated, 2007   Hypertension    Past Surgical History:  Procedure Laterality Date   CERVICAL DISC SURGERY  2001   anterior   COLONOSCOPY WITH PROPOFOL  N/A 03/14/2017   Procedure: COLONOSCOPY WITH PROPOFOL ;   Surgeon: Harvey Margo CROME, MD;  [facility]: [facility];  Service: Endoscopy;  Laterality: N/A;  8:15am   CYSTOSCOPY W/ URETERAL STENT PLACEMENT Left 03/23/2017   Procedure: CYSTOSCOPY WITH LEFT RETROGRADE PYELOGRAM/URETERAL LEFT STENT PLACEMENT;  Surgeon: Sherrilee Belvie CROME, MD;  Location: [facility];  Service: Urology;  Laterality: Left;   CYSTOSCOPY/RETROGRADE/URETEROSCOPY Left 07/17/2017   Procedure: CYSTOSCOPY/RETROGRADE/URETEROSCOPY, stent removal, stone extraction, stent replacement;  Surgeon: Sherrilee Belvie CROME, MD;  Location: [facility];  Service: Urology;  Laterality: Left;   HOLMIUM LASER APPLICATION Left 07/17/2017   Procedure: HOLMIUM LASER APPLICATION;  Surgeon: Sherrilee Belvie CROME, MD;  Location: [facility];  Service: Urology;  Laterality: Left;   LUMBAR SPINE SURGERY  2015   POLYPECTOMY  03/14/2017   Procedure: POLYPECTOMY;  Surgeon: Harvey Margo CROME, MD;  [facility];  Service: Endoscopy;;  rectum   TEE WITHOUT CARDIOVERSION N/A 09/19/2017   Procedure: TRANSESOPHAGEAL ECHOCARDIOGRAM (TEE);  Surgeon: Alvan Dorn FALCON, MD;  Location: [facility];  Service: Endoscopy;  Laterality: N/A;   Family History  Problem Relation Age of Onset   Cancer Mother    Dementia Sister    Colon cancer Neg Hx    Social History   Occupational History   Occupation: retired    Comment: former naval architect  Tobacco Use   Smoking status: Some Days    Current packs/day: 0.00    Average packs/day: 0.5 packs/day for 45.0 years (22.5 ttl pk-yrs)    Types: Cigarettes    Start date: 03/1972    Last attempt to quit: 03/2017    Years since quitting: 7.4   Smokeless tobacco: Never  Vaping Use   Vaping status: Never Used  Substance and Sexual Activity   Alcohol use: No   Drug use: No   Sexual activity: Not Currently    Birth control/protection: None   Tobacco Counseling Ready to quit: Not Answered Counseling given: Not Answered  SDOH Screenings   Food Insecurity: No Food Insecurity  (08/13/2024)  Housing: Unknown (08/13/2024)  Transportation Needs: No Transportation Needs (08/13/2024)  Depression (PHQ2-9): Low Risk (08/14/2024)  Financial Resource Strain: Low Risk (08/13/2024)  Physical Activity: Inactive (08/13/2024)  Social Connections: Unknown (08/13/2024)  Stress: No Stress Concern Present (08/13/2024)  Tobacco Use: High Risk (08/14/2024)   See flowsheets for full screening details  Depression Screen PHQ 2 & 9 Depression Scale- Over the past 2 weeks, how often have you been bothered by any of the following problems? Little interest or pleasure in doing things: 0 Feeling down, depressed, or hopeless (PHQ Adolescent also includes...irritable): 0 PHQ-2 Total Score: 0 Trouble falling or staying asleep, or sleeping too much: 0 Feeling tired or having little energy: 0 Poor appetite or overeating (PHQ Adolescent also includes...weight loss): 0 Feeling bad about yourself - or that you are a failure or have let yourself or your family down: 0 Trouble concentrating on things, such as reading the newspaper  or watching television (PHQ Adolescent also includes...like school work): 0 Moving or speaking so slowly that other people could have noticed. Or the opposite - being so fidgety or restless that you have been moving around a lot more than usual: 0 Thoughts that you would be better off dead, or of hurting yourself in some way: 0 PHQ-9 Total Score: 0 If you checked off any problems, how difficult have these problems made it for you to do your work, take care of things at home, or get along with other people?: Not difficult at all     Goals Addressed   None    Functional Status Survey: Is the patient deaf or have difficulty hearing?: No Does the patient have difficulty seeing, even when wearing glasses/contacts?: No Does the patient have difficulty concentrating, remembering, or making decisions?: No Does the patient have difficulty walking or climbing stairs?: Yes Does the patient  have difficulty dressing or bathing?: Yes Does the patient have difficulty doing errands alone such as visiting a doctor's office or shopping?: Yes     Objective:    Today's Vitals   08/14/24 0940 08/14/24 0956  BP: (!) 94/56 110/78  Pulse: 86   Temp: 98.1 F (36.7 C)   SpO2: 94%   Weight: 174 lb 9.6 oz (79.2 kg)   Height: 5' 11 (1.803 m)    Body mass index is 24.35 kg/m.  Hearing/Vision screen No results found. Immunizations and Health Maintenance Health Maintenance  Topic Date Due   DTaP/Tdap/Td (1 - Tdap) Never done   Zoster Vaccines- Shingrix (2 of 2) 09/28/2021   Lung Cancer Screening  05/27/2023   COVID-19 Vaccine (1 - 2025-26 season) Never done   Influenza Vaccine  11/05/2024 (Originally 03/08/2024)   Medicare Annual Wellness (AWV)  08/14/2025   Colonoscopy  03/15/2027   Pneumococcal Vaccine: 50+ Years  Completed   Hepatitis C Screening  Completed   Meningococcal B Vaccine  Aged Out   Hepatitis B Vaccines 19-59 Average Risk  Discontinued        Assessment/Plan:  This is a routine wellness examination for Brendan Charles.  Patient Care Team: Yoselyn Mcglade, Charmaine, [place] as PCP - General (Physician Assistant) Mallipeddi, Diannah SQUIBB, [place] as PCP - Cardiology (Cardiology) Harvey Margo CROME, MD (Inactive) as Consulting Physician (Gastroenterology) Skeet Juliene SAUNDERS, DO as Consulting Physician (Neurology)  I have personally reviewed and noted the following in the patients chart:   Medical and social history Use of alcohol, tobacco or illicit drugs  Current medications and supplements including opioid prescriptions. Functional ability and status Nutritional status Physical activity Advanced directives List of other physicians Hospitalizations, surgeries, and ER visits in previous 12 months Vitals Screenings to include cognitive, depression, and falls Referrals and appointments  Orders Placed This Encounter  Procedures   Lipid panel   CMP14+EGFR   CBC with  Differential/Platelet   Ambulatory Referral Lung Cancer Screening [facility]    Referral Priority:   Routine    Referral Type:   Consultation    Referral Reason:   Specialty Services Required    Number of Visits Requested:   1    In addition, I have reviewed and discussed with patient certain preventive protocols, quality metrics, and best practice recommendations. A written personalized care plan for preventive services as well as general preventive health recommendations were provided to patient.   Charmaine Jakaylee Sasaki, PA-C   08/14/2024   Return in 6 months (on 02/11/2025).  After Visit Summary: (In Person-Printed) AVS printed and given to  the patient  "

## 2024-08-15 LAB — CBC WITH DIFFERENTIAL/PLATELET
Basophils Absolute: 0 x10E3/uL (ref 0.0–0.2)
Basos: 0 %
EOS (ABSOLUTE): 0.1 x10E3/uL (ref 0.0–0.4)
Eos: 2 %
Hematocrit: 45.2 % (ref 37.5–51.0)
Hemoglobin: 14.5 g/dL (ref 13.0–17.7)
Immature Grans (Abs): 0 x10E3/uL (ref 0.0–0.1)
Immature Granulocytes: 0 %
Lymphocytes Absolute: 1.9 x10E3/uL (ref 0.7–3.1)
Lymphs: 24 %
MCH: 28.7 pg (ref 26.6–33.0)
MCHC: 32.1 g/dL (ref 31.5–35.7)
MCV: 90 fL (ref 79–97)
Monocytes Absolute: 0.7 x10E3/uL (ref 0.1–0.9)
Monocytes: 9 %
Neutrophils Absolute: 4.9 x10E3/uL (ref 1.4–7.0)
Neutrophils: 65 %
Platelets: 284 x10E3/uL (ref 150–450)
RBC: 5.05 x10E6/uL (ref 4.14–5.80)
RDW: 13 % (ref 11.6–15.4)
WBC: 7.6 x10E3/uL (ref 3.4–10.8)

## 2024-08-15 LAB — CMP14+EGFR
ALT: 24 IU/L (ref 0–44)
AST: 18 IU/L (ref 0–40)
Albumin: 4.8 g/dL (ref 3.9–4.9)
Alkaline Phosphatase: 176 IU/L — ABNORMAL HIGH (ref 47–123)
BUN/Creatinine Ratio: 20 (ref 10–24)
BUN: 23 mg/dL (ref 8–27)
Bilirubin Total: 0.5 mg/dL (ref 0.0–1.2)
CO2: 23 mmol/L (ref 20–29)
Calcium: 9.8 mg/dL (ref 8.6–10.2)
Chloride: 106 mmol/L (ref 96–106)
Creatinine, Ser: 1.17 mg/dL (ref 0.76–1.27)
Globulin, Total: 2.8 g/dL (ref 1.5–4.5)
Glucose: 98 mg/dL (ref 70–99)
Potassium: 4.5 mmol/L (ref 3.5–5.2)
Sodium: 144 mmol/L (ref 134–144)
Total Protein: 7.6 g/dL (ref 6.0–8.5)
eGFR: 67 mL/min/1.73

## 2024-08-15 LAB — LIPID PANEL
Chol/HDL Ratio: 2.7 ratio (ref 0.0–5.0)
Cholesterol, Total: 112 mg/dL (ref 100–199)
HDL: 41 mg/dL
LDL Chol Calc (NIH): 53 mg/dL (ref 0–99)
Triglycerides: 90 mg/dL (ref 0–149)
VLDL Cholesterol Cal: 18 mg/dL (ref 5–40)

## 2024-08-18 ENCOUNTER — Ambulatory Visit: Payer: Self-pay | Admitting: Physician Assistant

## 2024-08-22 ENCOUNTER — Telehealth: Payer: Self-pay

## 2024-08-22 DIAGNOSIS — Z122 Encounter for screening for malignant neoplasm of respiratory organs: Secondary | ICD-10-CM

## 2024-08-22 DIAGNOSIS — Z87891 Personal history of nicotine dependence: Secondary | ICD-10-CM

## 2024-08-22 NOTE — Telephone Encounter (Signed)
 Lung Cancer Screening Narrative/Criteria Questionnaire (Cigarette Smokers Only- No Cigars/Pipes/vapes)   Brendan Charles   SDMV:08/29/2024 at 8:00 am [name]        [birth year]               LDCT: 09/03/2024 at 7:00 am     69 y.o.   Phone: [phone]  Lung Screening Narrative (confirm age 17-77 yrs Medicare / 50-80 yrs Private pay insurance)   Insurance information: Medicare   Referring Provider: Grooms, PA   This screening involves an initial phone call with a team member from our program. It is called a shared decision making visit. The initial meeting is required by  insurance and Medicare to make sure you understand the program. This appointment takes about 15-20 minutes to complete. You will complete the screening scan at your scheduled date/time.  This scan takes about 5-10 minutes to complete. You can eat and drink normally before and after the scan.  Criteria questions for Lung Cancer Screening:   Are you a current or former smoker? Former Age began smoking: 12   If you are a former smoker, what year did you quit smoking? Quit in 2018 (within 15 yrs)   To calculate your smoking history, I need an accurate estimate of how many packs of cigarettes you smoked per day and for how many years. (Not just the number of PPD you are now smoking)   Years smoking 44 x Packs per day 1 = Pack years 42   (at least 20 pack yrs)   (Make sure they understand that we need to know how much they have smoked in the past, not just the number of PPD they are smoking now)  Do you have a personal history of cancer?  No    Do you have a family history of cancer? Yes  (cancer type and and relative) Mother had lung cancer.   Are you coughing up blood?  No  Have you had unexplained weight loss of 15 lbs or more in the last 6 months? No  It looks like you meet all criteria.  When would be a good time for us  to schedule you for this screening?   Additional information: N/A

## 2024-08-26 ENCOUNTER — Ambulatory Visit: Admitting: Physician Assistant

## 2024-08-26 ENCOUNTER — Encounter: Payer: Self-pay | Admitting: Physician Assistant

## 2024-08-26 VITALS — BP 127/76 | HR 69 | Temp 98.8°F | Ht 71.0 in | Wt 177.6 lb

## 2024-08-26 DIAGNOSIS — I69352 Hemiplegia and hemiparesis following cerebral infarction affecting left dominant side: Secondary | ICD-10-CM | POA: Diagnosis not present

## 2024-08-26 DIAGNOSIS — I69354 Hemiplegia and hemiparesis following cerebral infarction affecting left non-dominant side: Secondary | ICD-10-CM | POA: Insufficient documentation

## 2024-08-26 NOTE — Progress Notes (Signed)
 "  Established Patient Office Visit  Subjective   Patient ID: Brendan Charles, male    DOB: 05/09/1956  Age: 69 y.o. MRN: [id number]  Chief Complaint  Patient presents with   In Home Care    Pt needs a note stating that his son is his caregiver    Discussed the use of AI scribe software for clinical note transcription with the patient, who gave verbal consent to proceed.  History of Present Illness Brendan Charles is a 69 year old male who presents for a letter certifying he requires a caregiver. He is accompanied by his son, who assists with caregiving tasks.  His son, Brendan Charles, requests a letter stating he assists his father with activities of daily living.    He is scheduled to have a chest x-ray next Tuesday or Thursday. He mentions that the nurse is supposed to call him on Thursday to ask some questions. Following this, he has a hospital visit planned for the subsequent Tuesday.    Review of Systems  Constitutional:  Negative for activity change, appetite change, fatigue and fever.  Eyes:  Negative for visual disturbance.  Respiratory:  Negative for cough and shortness of breath.   Cardiovascular:  Negative for chest pain.  Neurological:  Negative for light-headedness and headaches.  Psychiatric/Behavioral:  Negative for agitation and decreased concentration. The patient is not nervous/anxious.        Objective:     BP 127/76   Pulse 69   Temp 98.8 F (37.1 C)   Ht 5' 11 (1.803 m)   Wt 177 lb 9.6 oz (80.6 kg)   SpO2 98%   BMI 24.77 kg/m    Physical Exam Constitutional:      General: He is not in acute distress.    Appearance: Normal appearance. He is normal weight. He is not ill-appearing.  HENT:     Head: Normocephalic and atraumatic.     Mouth/Throat:     Mouth: Mucous membranes are moist.     Pharynx: Oropharynx is clear.  Eyes:     Extraocular Movements: Extraocular movements intact.     Conjunctiva/sclera: Conjunctivae normal.   Cardiovascular:     Rate and Rhythm: Normal rate and regular rhythm.     Heart sounds: Normal heart sounds. No murmur heard. Pulmonary:     Effort: Pulmonary effort is normal.     Breath sounds: Normal breath sounds.  Musculoskeletal:     Right lower leg: No edema.     Left lower leg: No edema.  Skin:    General: Skin is warm and dry.  Neurological:     General: No focal deficit present.     Mental Status: He is alert and oriented to person, place, and time. Mental status is at baseline.  Psychiatric:        Mood and Affect: Mood normal.        Behavior: Behavior normal.     No results found for any visits on 08/26/24.  The ASCVD Risk score (Arnett DK, et al., 2019) failed to calculate for the following reasons:   Risk score cannot be calculated because patient has a medical history suggesting prior/existing ASCVD   * - Cholesterol units were assumed    Assessment & Plan:   Return as scheduled.   Spastic hemiparesis of left dominant side as late effect of cerebral infarction [facility]) Assessment & Plan: His son, Brendan Charles, provides caregiver support, including assistance with baths, meal preparation, and other  ADLs. - Letter provided documenting caregiver support by his son, Brendan Charles, including assistance with ADLs. - Follow up as scheduled or sooner for new concerns.     Shaheen Star, PA-C "

## 2024-08-26 NOTE — Assessment & Plan Note (Signed)
 His son, WILBERTH DAMON, provides caregiver support, including assistance with baths, meal preparation, and other ADLs. - Letter provided documenting caregiver support by his son, SHELDON SEM, including assistance with ADLs. - Follow up as scheduled or sooner for new concerns.

## 2024-08-29 ENCOUNTER — Encounter: Payer: Self-pay | Admitting: *Deleted

## 2024-08-29 ENCOUNTER — Ambulatory Visit: Admitting: *Deleted

## 2024-08-29 DIAGNOSIS — Z87891 Personal history of nicotine dependence: Secondary | ICD-10-CM

## 2024-08-29 NOTE — Patient Instructions (Signed)

## 2024-08-29 NOTE — Progress Notes (Signed)
 Virtual Visit via Telephone Note  I connected with Brendan Charles on 08/29/24 at  8:00 AM EST by telephone and verified that I am speaking with the correct person using two identifiers.  Location: Patient: at home Provider: [address], [place], [place], [address]    I discussed the limitations, risks, security and privacy concerns of performing an evaluation and management service by telephone and the availability of in person appointments. I also discussed with the patient that there may be a patient responsible charge related to this service. The patient expressed understanding and agreed to proceed.    Shared Decision Making Visit Lung Cancer Screening Program [phone])   Eligibility: Age 69 y.o. Pack Years Smoking History Calculation 45 (# packs/per year x # years smoked) Recent History of coughing up blood  no Unexplained weight loss? no ( >Than 15 pounds within the last 6 months ) Prior History Lung / other cancer no (Diagnosis within the last 5 years already requiring surveillance chest CT Scans). Smoking Status Former Smoker Former Smokers: Years since quit: 8 years  Quit Date: 2018  Visit Components: Discussion included one or more decision making aids. yes Discussion included risk/benefits of screening. yes Discussion included potential follow up diagnostic testing for abnormal scans. yes Discussion included meaning and risk of over diagnosis. yes Discussion included meaning and risk of False Positives. yes Discussion included meaning of total radiation exposure. yes  Counseling Included: Importance of adherence to annual lung cancer LDCT screening. yes Impact of comorbidities on ability to participate in the program. yes Ability and willingness to under diagnostic treatment. yes  Smoking Cessation Counseling: Current Smokers:  Discussed importance of smoking cessation. yes Information about tobacco cessation classes and interventions provided to  patient. yes Patient provided with ticket for LDCT Scan. yes Symptomatic Patient. no  Counseling(Intermediate counseling: > three minutes) 99406 Diagnosis Code: Tobacco Use Z72.0 Asymptomatic Patient yes  Counseled patient 4 minutes regarding tobacco use.   Former Smokers:  Discussed the importance of maintaining cigarette abstinence. yes Diagnosis Code: Personal History of Nicotine Dependence. S12.108 Information about tobacco cessation classes and interventions provided to patient. Yes Patient provided with ticket for LDCT Scan. no Written Order for Lung Cancer Screening with LDCT placed in Epic. Yes (CT Chest Lung Cancer Screening Low Dose W/O CM) [id number] Z12.2-Screening of respiratory organs Z87.891-Personal history of nicotine dependence   Laneta Speaks, RN

## 2024-09-03 ENCOUNTER — Ambulatory Visit (HOSPITAL_COMMUNITY)
Admission: RE | Admit: 2024-09-03 | Discharge: 2024-09-03 | Disposition: A | Discharge status: Home / Self Care | Source: Ambulatory Visit | Attending: Acute Care | Admitting: Acute Care

## 2024-09-03 DIAGNOSIS — Z87891 Personal history of nicotine dependence: Secondary | ICD-10-CM | POA: Diagnosis present

## 2024-09-03 DIAGNOSIS — Z122 Encounter for screening for malignant neoplasm of respiratory organs: Secondary | ICD-10-CM | POA: Insufficient documentation

## 2024-09-05 ENCOUNTER — Other Ambulatory Visit: Payer: Self-pay

## 2024-09-05 DIAGNOSIS — Z122 Encounter for screening for malignant neoplasm of respiratory organs: Secondary | ICD-10-CM

## 2024-09-05 DIAGNOSIS — Z87891 Personal history of nicotine dependence: Secondary | ICD-10-CM

## 2024-09-24 ENCOUNTER — Ambulatory Visit: Admitting: Internal Medicine

## 2025-02-11 ENCOUNTER — Ambulatory Visit: Admitting: Physician Assistant
